# Patient Record
Sex: Male | Born: 1945
Health system: Southern US, Community
[De-identification: ages and names within clinical notes are randomized; demographics above are authoritative.]

## PROBLEM LIST (undated history)

## (undated) DIAGNOSIS — I639 Cerebral infarction, unspecified: Secondary | ICD-10-CM

## (undated) DIAGNOSIS — Z860101 Personal history of adenomatous and serrated colon polyps: Secondary | ICD-10-CM

## (undated) DIAGNOSIS — M6530 Trigger finger, unspecified finger: Secondary | ICD-10-CM

## (undated) DIAGNOSIS — M1990 Unspecified osteoarthritis, unspecified site: Secondary | ICD-10-CM

## (undated) DIAGNOSIS — I4891 Unspecified atrial fibrillation: Secondary | ICD-10-CM

## (undated) DIAGNOSIS — R001 Bradycardia, unspecified: Secondary | ICD-10-CM

## (undated) DIAGNOSIS — I10 Essential (primary) hypertension: Secondary | ICD-10-CM

## (undated) DIAGNOSIS — J939 Pneumothorax, unspecified: Secondary | ICD-10-CM

## (undated) DIAGNOSIS — I77810 Thoracic aortic ectasia: Secondary | ICD-10-CM

## (undated) DIAGNOSIS — D649 Anemia, unspecified: Secondary | ICD-10-CM

## (undated) DIAGNOSIS — M720 Palmar fascial fibromatosis [Dupuytren]: Secondary | ICD-10-CM

## (undated) DIAGNOSIS — Z86010 Personal history of colonic polyps: Secondary | ICD-10-CM

## (undated) DIAGNOSIS — I739 Peripheral vascular disease, unspecified: Secondary | ICD-10-CM

## (undated) HISTORY — DX: Essential (primary) hypertension: I10

## (undated) HISTORY — PX: OTHER SURGICAL HISTORY: SHX169

## (undated) HISTORY — DX: Peripheral vascular disease, unspecified: I73.9

## (undated) HISTORY — DX: Pneumothorax, unspecified: J93.9

## (undated) HISTORY — DX: Cerebral infarction, unspecified: I63.9

## (undated) HISTORY — PX: INGUINAL HERNIA REPAIR: SUR1180

## (undated) HISTORY — DX: Personal history of colonic polyps: Z86.010

## (undated) HISTORY — DX: Personal history of adenomatous and serrated colon polyps: Z86.0101

---

## 1998-09-18 ENCOUNTER — Inpatient Hospital Stay (HOSPITAL_COMMUNITY)
Admission: EM | Admit: 1998-09-18 | Discharge: 1998-09-24 | Payer: Self-pay | Admitting: Thoracic Surgery (Cardiothoracic Vascular Surgery)

## 1998-09-18 ENCOUNTER — Encounter: Payer: Self-pay | Admitting: Thoracic Surgery (Cardiothoracic Vascular Surgery)

## 1998-09-18 DIAGNOSIS — J939 Pneumothorax, unspecified: Secondary | ICD-10-CM

## 1998-09-18 HISTORY — DX: Pneumothorax, unspecified: J93.9

## 1998-09-19 ENCOUNTER — Encounter: Payer: Self-pay | Admitting: Thoracic Surgery (Cardiothoracic Vascular Surgery)

## 1998-09-20 ENCOUNTER — Encounter: Payer: Self-pay | Admitting: Thoracic Surgery (Cardiothoracic Vascular Surgery)

## 1998-09-20 HISTORY — PX: CRANIOTOMY: SHX93

## 1998-09-21 ENCOUNTER — Encounter: Payer: Self-pay | Admitting: Thoracic Surgery (Cardiothoracic Vascular Surgery)

## 1998-09-22 ENCOUNTER — Encounter: Payer: Self-pay | Admitting: Thoracic Surgery (Cardiothoracic Vascular Surgery)

## 1998-09-22 ENCOUNTER — Encounter: Payer: Self-pay | Admitting: Surgery

## 1998-09-23 ENCOUNTER — Encounter: Payer: Self-pay | Admitting: Thoracic Surgery (Cardiothoracic Vascular Surgery)

## 2001-12-16 ENCOUNTER — Emergency Department (HOSPITAL_COMMUNITY): Admission: EM | Admit: 2001-12-16 | Discharge: 2001-12-16 | Payer: Self-pay | Admitting: Emergency Medicine

## 2003-06-06 ENCOUNTER — Encounter: Payer: Self-pay | Admitting: Internal Medicine

## 2003-06-06 ENCOUNTER — Ambulatory Visit (HOSPITAL_COMMUNITY): Admission: RE | Admit: 2003-06-06 | Discharge: 2003-06-06 | Payer: Self-pay | Admitting: Internal Medicine

## 2003-09-21 DIAGNOSIS — I639 Cerebral infarction, unspecified: Secondary | ICD-10-CM

## 2003-09-21 HISTORY — DX: Cerebral infarction, unspecified: I63.9

## 2004-10-20 ENCOUNTER — Ambulatory Visit (HOSPITAL_COMMUNITY): Admission: RE | Admit: 2004-10-20 | Discharge: 2004-10-20 | Payer: Self-pay | Admitting: Internal Medicine

## 2004-10-22 ENCOUNTER — Ambulatory Visit (HOSPITAL_COMMUNITY): Admission: RE | Admit: 2004-10-22 | Discharge: 2004-10-22 | Payer: Self-pay | Admitting: Internal Medicine

## 2004-10-27 ENCOUNTER — Ambulatory Visit (HOSPITAL_COMMUNITY): Admission: RE | Admit: 2004-10-27 | Discharge: 2004-10-27 | Payer: Self-pay | Admitting: Internal Medicine

## 2004-10-27 DIAGNOSIS — S065XAA Traumatic subdural hemorrhage with loss of consciousness status unknown, initial encounter: Secondary | ICD-10-CM

## 2004-10-27 HISTORY — DX: Traumatic subdural hemorrhage with loss of consciousness status unknown, initial encounter: S06.5XAA

## 2005-03-26 ENCOUNTER — Ambulatory Visit (HOSPITAL_COMMUNITY): Admission: RE | Admit: 2005-03-26 | Discharge: 2005-03-26 | Payer: Self-pay | Admitting: Internal Medicine

## 2005-08-18 ENCOUNTER — Encounter (INDEPENDENT_AMBULATORY_CARE_PROVIDER_SITE_OTHER): Payer: Self-pay | Admitting: General Surgery

## 2005-08-18 ENCOUNTER — Observation Stay (HOSPITAL_COMMUNITY): Admission: RE | Admit: 2005-08-18 | Discharge: 2005-08-19 | Payer: Self-pay | Admitting: General Surgery

## 2007-06-08 ENCOUNTER — Ambulatory Visit (HOSPITAL_COMMUNITY): Admission: RE | Admit: 2007-06-08 | Discharge: 2007-06-08 | Payer: Self-pay | Admitting: Family Medicine

## 2008-09-24 ENCOUNTER — Ambulatory Visit (HOSPITAL_COMMUNITY): Admission: RE | Admit: 2008-09-24 | Discharge: 2008-09-24 | Payer: Self-pay | Admitting: Internal Medicine

## 2008-10-02 ENCOUNTER — Ambulatory Visit (HOSPITAL_COMMUNITY): Admission: RE | Admit: 2008-10-02 | Discharge: 2008-10-02 | Payer: Self-pay | Admitting: Orthopaedic Surgery

## 2009-01-10 ENCOUNTER — Ambulatory Visit: Payer: Self-pay | Admitting: Gastroenterology

## 2009-01-13 ENCOUNTER — Encounter: Payer: Self-pay | Admitting: Gastroenterology

## 2009-01-13 DIAGNOSIS — K625 Hemorrhage of anus and rectum: Secondary | ICD-10-CM

## 2009-01-18 HISTORY — PX: COLONOSCOPY: SHX174

## 2009-01-20 ENCOUNTER — Ambulatory Visit: Payer: Self-pay | Admitting: Gastroenterology

## 2009-01-20 ENCOUNTER — Ambulatory Visit (HOSPITAL_COMMUNITY): Admission: RE | Admit: 2009-01-20 | Discharge: 2009-01-20 | Payer: Self-pay | Admitting: Gastroenterology

## 2009-01-20 ENCOUNTER — Encounter: Payer: Self-pay | Admitting: Gastroenterology

## 2009-07-15 ENCOUNTER — Ambulatory Visit: Payer: Self-pay | Admitting: Gastroenterology

## 2009-07-15 DIAGNOSIS — D126 Benign neoplasm of colon, unspecified: Secondary | ICD-10-CM | POA: Insufficient documentation

## 2009-07-17 DIAGNOSIS — K648 Other hemorrhoids: Secondary | ICD-10-CM

## 2009-08-08 ENCOUNTER — Ambulatory Visit (HOSPITAL_COMMUNITY): Admission: RE | Admit: 2009-08-08 | Discharge: 2009-08-08 | Payer: Self-pay | Admitting: Internal Medicine

## 2011-02-02 NOTE — Op Note (Signed)
NAME:  Larry Reese, Larry Reese             ACCOUNT NO.:  [id number]   MEDICAL RECORD NO.:  [id number]          PATIENT TYPE:  AMB   LOCATION:  DAY                           FACILITY:  [facility]   PHYSICIAN:  Kassie Mends, M.D.      DATE OF BIRTH:  [birth year]   DATE OF PROCEDURE:  01/20/2009  DATE OF DISCHARGE:                               OPERATIVE REPORT   PROCEDURE:  Ileocolonoscopy with cold forceps and snare cautery  polypectomy.   INDICATION FOR EXAM:  Larry Reese is a 65 year old male, who presents  with intermittent rectal bleeding and constipation.   FINDINGS:  1. Tortuous colon.  Able to visualize 5 cm of the distal terminal      ileum.  It was normal.  2. Two 6-mm transverse colon polyps were removed via cold forceps.      Two 6-mm sessile descending colon polyps were removed via snare      cautery.  Two 3-4 mm sessile rectal polyps were removed via cold      forceps.  Otherwise no evidence of masses, inflammatory changes, or      diverticular AVMs.  3. Moderate internal hemorrhoids.  Otherwise normal retroflexed view      of the rectum.   DIAGNOSES:  1. Rectal bleeding likely secondary to hemorrhoids.  2. Multiple colon polyps.   RECOMMENDATIONS:  1. The patient is instructed to drink 6-8 cups of water daily.  He is      to add Benefiber twice a day and follow high fiber diet.  He is      given a handout on high-fiber diet, polyps, and hemorrhoids.  2. No aspirin, NSAIDs, or anticoagulation for 7 days.  3. Will call Larry Reese with the results of biopsies.  Due to the      number of polyps, we would consider colonoscopy in 5 years if he      has simple adenomas.  His first-degree relatives should have a      screening colonoscopy at age 71 and then every 5 years.  4. Follow up appointment in 3 months.  5. Will check a TSH to evaluate for thyroid disease as a etiology for      his constipation.   MEDICATIONS:  1. Demerol 100 mg IV.  2. Versed 6 mg IV.   PROCEDURE  TECHNIQUE:  Physical exam was performed.  Informed consent was  obtained from the patient explaining the benefits, risks, and  alternatives to the procedure.  The patient was connected to monitor and  placed in left lateral position.  Continuous oxygen was provided by  nasal cannula.  IV medicine administered through an indwelling cannula.  After administration of sedation and rectal exam, the patient's rectum  was intubated.  Scope was advanced under direct visualization to the  distal terminal ileum.  The scope was removed slowly by careful  examining the color, texture, anatomy, and integrity of mucosa on the  way out.  The patient was recovered in endoscopy and discharged home in  satisfactory condition.   PATH:  Simple adenomas. TCS in 5 years High fiber  diet. First degree  relatives need TCS at age 32 and then every 5 years.      Kassie Mends, M.D.  Electronically Signed     SM/MEDQ  D:  01/20/2009  T:  01/21/2009  Job:  [id number]   cc:   Tesfaye D. Felecia Shelling, MD  Fax: [phone]

## 2011-02-05 NOTE — Op Note (Signed)
NAME:  NOLAWI, KANADY NO.:  [id number]   MEDICAL RECORD NO.:  [id number]          PATIENT TYPE:  OBV   LOCATION:  A304                          FACILITY:  [facility]   PHYSICIAN:  Barbaraann Barthel, M.D. DATE OF BIRTH:  1946-03-17   DATE OF PROCEDURE:  08/18/2005  DATE OF DISCHARGE:                                 OPERATIVE REPORT   SURGEON:  Barbaraann Barthel, M.D.   PRE-AND-POSTOPERATIVE DIAGNOSIS:  Right inguinal hernia   PROCEDURE:  Right inguinal herniorrhaphy (modified McVay repair).   SPECIMEN:  The specimen is a hernia sac with lipomatous properitoneal fat.   NOTE:  This is 65 year old black male who presented with a right groin mass  consistent with a large, nonincarcerated, right, inguinal hernia. We  discussed elective repair, in detail, in my office. We discussed  complications not limited to but including bleeding, infection and  recurrence. Informed consent was obtained.   GROSS OPERATIVE FINDINGS:  Those consistent with an indirect hernia and some  lipomatous cord-like structures. There was some weakness of the inguinal  floor as well. There were no other abnormalities encountered.   TECHNIQUE:  The patient was placed in the supine position after the adequate  administration of spinal anesthesia.  A Foley catheter was placed; and then  after prepping with Betadine and draping in the usual manner, an incision  was carried out between the anterior-superior iliac spine and pubic  tubercle.  Skin, subcutaneous tissue, and the external oblique was opened  through the external ring. The ilioinguinal nerve was preserved from the  dissection.  The cord structures were encountered, dissected free from a  hernia sac; and the hernia sac was the ligated under direct vision with a  pursestring suture, and the redundant portion was then amputated and sent as  a specimen.  Two large lipomatous structures were likewise ligated with 2-0  silk; and sent as well with  the specimen.   The inguinal floor was then repaired the suturing transversus abdominis and  transversalis fascia to Cooper's ligament and Poupart's ligament with  interrupted 2-0 Bralon sutures. Prior to cinching these tightly, a relaxing  incision was carried out. We then used approximately 10 mL of 1/2%  Sensorcaine to help with postoperative comfort in this area. We then  returned the cord structures to their anatomical position and closed the  external oblique  over this with a running 3-0 Polysorb suture. Subcu was irrigated; and the  skin was approximated with a stapling device. Prior to closure all sponge,  needle, and instrument counts were found to be correct. Estimated blood loss  was minimal. The patient received 700 mL of crystalloids intraoperatively.  There were no complications.      Barbaraann Barthel, M.D.  Electronically Signed     WB/MEDQ  D:  08/18/2005  T:  08/18/2005  Job:  [id number]   cc:   Tesfaye D. Felecia Shelling, MD  Fax: [phone]

## 2013-10-01 ENCOUNTER — Ambulatory Visit (HOSPITAL_COMMUNITY)
Admission: RE | Admit: 2013-10-01 | Discharge: 2013-10-01 | Disposition: A | Discharge status: Home / Self Care | Payer: Medicare HMO | Source: Ambulatory Visit | Attending: Internal Medicine | Admitting: Internal Medicine

## 2013-10-01 ENCOUNTER — Other Ambulatory Visit (HOSPITAL_COMMUNITY): Payer: Self-pay | Admitting: Internal Medicine

## 2013-10-01 DIAGNOSIS — J40 Bronchitis, not specified as acute or chronic: Secondary | ICD-10-CM

## 2013-10-01 DIAGNOSIS — J449 Chronic obstructive pulmonary disease, unspecified: Secondary | ICD-10-CM | POA: Insufficient documentation

## 2013-10-01 DIAGNOSIS — Z87891 Personal history of nicotine dependence: Secondary | ICD-10-CM | POA: Insufficient documentation

## 2013-10-01 DIAGNOSIS — J4489 Other specified chronic obstructive pulmonary disease: Secondary | ICD-10-CM | POA: Insufficient documentation

## 2013-10-01 DIAGNOSIS — R0989 Other specified symptoms and signs involving the circulatory and respiratory systems: Secondary | ICD-10-CM | POA: Insufficient documentation

## 2013-12-24 ENCOUNTER — Encounter: Payer: Self-pay | Admitting: Gastroenterology

## 2015-01-20 ENCOUNTER — Encounter: Payer: Self-pay | Admitting: Gastroenterology

## 2015-02-07 ENCOUNTER — Other Ambulatory Visit (HOSPITAL_COMMUNITY): Payer: Medicare HMO

## 2015-02-07 ENCOUNTER — Ambulatory Visit: Payer: Self-pay | Admitting: Gastroenterology

## 2015-02-10 ENCOUNTER — Other Ambulatory Visit: Payer: Self-pay

## 2015-02-10 ENCOUNTER — Encounter (INDEPENDENT_AMBULATORY_CARE_PROVIDER_SITE_OTHER): Payer: Self-pay

## 2015-02-10 ENCOUNTER — Encounter (HOSPITAL_COMMUNITY)
Admission: RE | Admit: 2015-02-10 | Discharge: 2015-02-10 | Disposition: A | Discharge status: Home / Self Care | Payer: PPO | Source: Ambulatory Visit | Attending: Orthopaedic Surgery | Admitting: Orthopaedic Surgery

## 2015-02-10 ENCOUNTER — Encounter (HOSPITAL_COMMUNITY): Payer: Self-pay

## 2015-02-10 ENCOUNTER — Encounter: Payer: Self-pay | Admitting: Gastroenterology

## 2015-02-10 ENCOUNTER — Ambulatory Visit (INDEPENDENT_AMBULATORY_CARE_PROVIDER_SITE_OTHER): Payer: PPO | Admitting: Gastroenterology

## 2015-02-10 VITALS — BP 133/81 | HR 60 | Temp 97.7°F | Ht 78.0 in | Wt 225.2 lb

## 2015-02-10 DIAGNOSIS — M1990 Unspecified osteoarthritis, unspecified site: Secondary | ICD-10-CM | POA: Diagnosis not present

## 2015-02-10 DIAGNOSIS — R9431 Abnormal electrocardiogram [ECG] [EKG]: Secondary | ICD-10-CM | POA: Diagnosis not present

## 2015-02-10 DIAGNOSIS — R7989 Other specified abnormal findings of blood chemistry: Secondary | ICD-10-CM

## 2015-02-10 DIAGNOSIS — Z86010 Personal history of colonic polyps: Secondary | ICD-10-CM

## 2015-02-10 DIAGNOSIS — Z79899 Other long term (current) drug therapy: Secondary | ICD-10-CM | POA: Diagnosis not present

## 2015-02-10 DIAGNOSIS — Z8673 Personal history of transient ischemic attack (TIA), and cerebral infarction without residual deficits: Secondary | ICD-10-CM | POA: Diagnosis not present

## 2015-02-10 DIAGNOSIS — M7021 Olecranon bursitis, right elbow: Secondary | ICD-10-CM | POA: Diagnosis present

## 2015-02-10 DIAGNOSIS — F1010 Alcohol abuse, uncomplicated: Secondary | ICD-10-CM | POA: Diagnosis not present

## 2015-02-10 DIAGNOSIS — R945 Abnormal results of liver function studies: Secondary | ICD-10-CM

## 2015-02-10 DIAGNOSIS — F17210 Nicotine dependence, cigarettes, uncomplicated: Secondary | ICD-10-CM | POA: Diagnosis not present

## 2015-02-10 DIAGNOSIS — R001 Bradycardia, unspecified: Secondary | ICD-10-CM | POA: Diagnosis not present

## 2015-02-10 DIAGNOSIS — I10 Essential (primary) hypertension: Secondary | ICD-10-CM | POA: Diagnosis not present

## 2015-02-10 DIAGNOSIS — K7010 Alcoholic hepatitis without ascites: Secondary | ICD-10-CM | POA: Insufficient documentation

## 2015-02-10 HISTORY — DX: Trigger finger, unspecified finger: M65.30

## 2015-02-10 HISTORY — DX: Unspecified osteoarthritis, unspecified site: M19.90

## 2015-02-10 LAB — CBC WITH DIFFERENTIAL/PLATELET
Basophils Absolute: 0 10*3/uL (ref 0.0–0.1)
Basophils Relative: 0 % (ref 0–1)
EOS ABS: 0.2 10*3/uL (ref 0.0–0.7)
EOS PCT: 4 % (ref 0–5)
HCT: 39.2 % (ref 39.0–52.0)
HEMOGLOBIN: 13 g/dL (ref 13.0–17.0)
LYMPHS PCT: 35 % (ref 12–46)
Lymphs Abs: 1.7 10*3/uL (ref 0.7–4.0)
MCH: 31.3 pg (ref 26.0–34.0)
MCHC: 33.2 g/dL (ref 30.0–36.0)
MCV: 94.5 fL (ref 78.0–100.0)
Monocytes Absolute: 0.5 10*3/uL (ref 0.1–1.0)
Monocytes Relative: 10 % (ref 3–12)
NEUTROS ABS: 2.4 10*3/uL (ref 1.7–7.7)
Neutrophils Relative %: 51 % (ref 43–77)
PLATELETS: 329 10*3/uL (ref 150–400)
RBC: 4.15 MIL/uL — AB (ref 4.22–5.81)
RDW: 12.8 % (ref 11.5–15.5)
WBC: 4.9 10*3/uL (ref 4.0–10.5)

## 2015-02-10 LAB — URINALYSIS, ROUTINE W REFLEX MICROSCOPIC
BILIRUBIN URINE: NEGATIVE
Glucose, UA: NEGATIVE mg/dL
Hgb urine dipstick: NEGATIVE
Ketones, ur: NEGATIVE mg/dL
Leukocytes, UA: NEGATIVE
Nitrite: NEGATIVE
PROTEIN: NEGATIVE mg/dL
Specific Gravity, Urine: 1.025 (ref 1.005–1.030)
Urobilinogen, UA: 0.2 mg/dL (ref 0.0–1.0)
pH: 5.5 (ref 5.0–8.0)

## 2015-02-10 LAB — COMPREHENSIVE METABOLIC PANEL
ALBUMIN: 3.9 g/dL (ref 3.5–5.0)
ALK PHOS: 78 U/L (ref 38–126)
ALT: 15 U/L — AB (ref 17–63)
ANION GAP: 8 (ref 5–15)
AST: 18 U/L (ref 15–41)
BUN: 13 mg/dL (ref 6–20)
CO2: 26 mmol/L (ref 22–32)
Calcium: 9.2 mg/dL (ref 8.9–10.3)
Chloride: 102 mmol/L (ref 101–111)
Creatinine, Ser: 0.93 mg/dL (ref 0.61–1.24)
GFR calc Af Amer: >60 mL/min (ref >60)
Glucose, Bld: 101 mg/dL — ABNORMAL HIGH (ref 65–99)
Potassium: 4.7 mmol/L (ref 3.5–5.1)
Sodium: 136 mmol/L (ref 135–145)
TOTAL PROTEIN: 6.7 g/dL (ref 6.5–8.1)
Total Bilirubin: 0.8 mg/dL (ref 0.3–1.2)

## 2015-02-10 NOTE — Pre-Procedure Instructions (Signed)
Patient given information to sign up for my chart at home. 

## 2015-02-10 NOTE — Patient Instructions (Signed)
ROWYN SPILDE  02/10/2015     @PREFPERIOPPHARMACY @   Your procedure is scheduled on  02/11/2015  Report to [facility] at  31  A.M.  Call this number if you have problems the morning of surgery:  [phone]   Remember:  Do not eat food or drink liquids after midnight.  Take these medicines the morning of surgery with A SIP OF WATER  Xanax, amlodipine, hydrocodone.   Do not wear jewelry, make-up or nail polish.  Do not wear lotions, powders, or perfumes.  You may wear deodorant.  Do not shave 48 hours prior to surgery.  Men may shave face and neck.  Do not bring valuables to the hospital.  [facility] is not responsible for any belongings or valuables.  Contacts, dentures or bridgework may not be worn into surgery.  Leave your suitcase in the car.  After surgery it may be brought to your room.  For patients admitted to the hospital, discharge time will be determined by your treatment team.  Patients discharged the day of surgery will not be allowed to drive home.   Name and phone number of your driver:   family Special instructions:  none  Please read over the following fact sheets that you were given. Surgical Site Infection Prevention and Anesthesia Post-op Instructions      Olecranon Bursitis Bursitis is swelling and soreness (inflammation) of a fluid-filled sac (bursa) that covers and protects a joint. Olecranon bursitis occurs over the elbow.  CAUSES Bursitis can be caused by injury, overuse of the joint, arthritis, or infection.  SYMPTOMS   Tenderness, swelling, warmth, or redness over the elbow.  Elbow pain with movement. This is greater with bending the elbow.  Squeaking sound when the bursa is rubbed or moved.  Increasing size of the bursa without pain or discomfort.  Fever with increasing pain and swelling if the bursa becomes infected. HOME CARE INSTRUCTIONS   Put ice on the affected area.  Put ice in a plastic bag.  Place a towel  between your skin and the bag.  Leave the ice on for 15-20 minutes each hour while awake. Do this for the first 2 days.  When resting, elevate your elbow above the level of your heart. This helps reduce swelling.  Continue to put the joint through a full range of motion 4 times per day. Rest the injured joint at other times. When the pain lessens, begin normal slow movements and usual activities.  Only take over-the-counter or prescription medicines for pain, discomfort, or fever as directed by your caregiver.  Reduce your intake of milk and related dairy products (cheese, yogurt). They may make your condition worse. SEEK IMMEDIATE MEDICAL CARE IF:   Your pain increases even during treatment.  You have a fever.  You have heat and inflammation over the bursa and elbow.  You have a red line that goes up your arm.  You have pain with movement of your elbow. MAKE SURE YOU:   Understand these instructions.  Will watch your condition.  Will get help right away if you are not doing well or get worse. Document Released: 10/06/2006 Document Revised: 11/29/2011 Document Reviewed: 08/22/2007 [facility] Patient Information 2015 [place], [place]. This information is not intended to replace advice given to you by your health care provider. Make sure you discuss any questions you have with your health care provider. PATIENT INSTRUCTIONS POST-ANESTHESIA  IMMEDIATELY FOLLOWING SURGERY:  Do not drive or operate machinery  for the first twenty four hours after surgery.  Do not make any important decisions for twenty four hours after surgery or while taking narcotic pain medications or sedatives.  If you develop intractable nausea and vomiting or a severe headache please notify your doctor immediately.  FOLLOW-UP:  Please make an appointment with your surgeon as instructed. You do not need to follow up with anesthesia unless specifically instructed to do so.  WOUND CARE INSTRUCTIONS (if applicable):   Keep a dry clean dressing on the anesthesia/puncture wound site if there is drainage.  Once the wound has quit draining you may leave it open to air.  Generally you should leave the bandage intact for twenty four hours unless there is drainage.  If the epidural site drains for more than 36-48 hours please call the anesthesia department.  QUESTIONS?:  Please feel free to call your physician or the hospital operator if you have any questions, and they will be happy to assist you.

## 2015-02-10 NOTE — Assessment & Plan Note (Signed)
Routine labs recently showed mildly elevated total bilirubin, mostly indirect however, minimally elevated alkaline phosphatase. Patient has a history of chronic daily alcohol use. Hepatitis C antibody was negative. His hepatitis B surface antibody was reactive, hepatitis B surface antigen nonreactive. 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  At this time routine labs to rule out hemochromatosis, repeat LFTs, rule out PBC. Abdominal ultrasound with elastography given abnormal lfts and long-term daily etoh use. Further recommendations to follow. He was advised to try cutting back on alcohol consumption with goal of stopping altogether.

## 2015-02-10 NOTE — OR Nursing (Signed)
EKG reported to Dr. Patsey Berthold,  No further orders needed

## 2015-02-10 NOTE — H&P (Signed)
Larry Reese is an 69 y.o. male.   Chief Complaint: Right olecranon bursitis. HPI: He has a large right olecranon bursa swelling, about the size of a lime to a lemon.  It is not red.  It has bothered him for several months.  I aspirated it on 01/15/15 but it recurred.  I aspirated it again on 01/22/15.  I have advised excision because it comes back.  It has not been red.  It bothers him when he rests his arm/elbow on a chair.  He has no trauma.  I have explained the outpatient elective surgery including risks and imponderables.  He will have a drain placed in the elbow and see me the following day in the office for removal.  He has elected to have the surgery at this time.  He asked appropriate questions and appears to understand the procedure.  Past Medical History  Diagnosis Date  . Hypertension   . Pneumothorax   . Hx of adenomatous colonic polyps   . Stroke 2005    left sided weakness  . Arthritis   . Trigger finger     Past Surgical History  Procedure Laterality Date  . Colonoscopy  May 2010    Dr. fields: 5 cm terminal ileum normal, 6 polyps removed, moderate internal hemorrhoids, simple adenomas, next colonoscopy May 2015  . Craniotomy  2000    pressure  . Inguinal hernia repair Right   . Repair of trigger finger Left     pinky    Family History  Problem Relation Age of Onset  . Esophageal cancer Father   . Colon cancer Neg Hx    Social History:  reports that he has been smoking Cigarettes.  He has a 37.5 pack-year smoking history. He does not have any smokeless tobacco history on file. He reports that he drinks about 3.0 oz of alcohol per week. He reports that he does not use illicit drugs.  Allergies:  Allergies  Allergen Reactions  . Aleve [Naproxen] Other (See Comments)    Numbness in extremities   . Celecoxib Other (See Comments)    Nose bleed.  . Fluoxetine Hcl Swelling    No prescriptions prior to admission    Results for orders placed or performed  during the hospital encounter of 02/10/15 (from the past 48 hour(s))  CBC with Differential/Platelet     Status: Abnormal   Collection Time: 02/10/15 12:00 PM  Result Value Ref Range   WBC 4.9 4.0 - 10.5 K/uL   RBC 4.15 (L) 4.22 - 5.81 MIL/uL   Hemoglobin 13.0 13.0 - 17.0 g/dL   HCT 39.2 39.0 - 52.0 %   MCV 94.5 78.0 - 100.0 fL   MCH 31.3 26.0 - 34.0 pg   MCHC 33.2 30.0 - 36.0 g/dL   RDW 12.8 11.5 - 15.5 %   Platelets 329 150 - 400 K/uL   Neutrophils Relative % 51 43 - 77 %   Neutro Abs 2.4 1.7 - 7.7 K/uL   Lymphocytes Relative 35 12 - 46 %   Lymphs Abs 1.7 0.7 - 4.0 K/uL   Monocytes Relative 10 3 - 12 %   Monocytes Absolute 0.5 0.1 - 1.0 K/uL   Eosinophils Relative 4 0 - 5 %   Eosinophils Absolute 0.2 0.0 - 0.7 K/uL   Basophils Relative 0 0 - 1 %   Basophils Absolute 0.0 0.0 - 0.1 K/uL  Comprehensive metabolic panel     Status: Abnormal   Collection Time: 02/10/15 12:00  PM  Result Value Ref Range   Sodium 136 135 - 145 mmol/L   Potassium 4.7 3.5 - 5.1 mmol/L   Chloride 102 101 - 111 mmol/L   CO2 26 22 - 32 mmol/L   Glucose, Bld 101 (H) 65 - 99 mg/dL   BUN 13 6 - 20 mg/dL   Creatinine, Ser 0.93 0.61 - 1.24 mg/dL   Calcium 9.2 8.9 - 10.3 mg/dL   Total Protein 6.7 6.5 - 8.1 g/dL   Albumin 3.9 3.5 - 5.0 g/dL   AST 18 15 - 41 U/L   ALT 15 (L) 17 - 63 U/L   Alkaline Phosphatase 78 38 - 126 U/L   Total Bilirubin 0.8 0.3 - 1.2 mg/dL   GFR calc non Af Amer >60 >60 mL/min   GFR calc Af Amer >60 >60 mL/min    Comment: (NOTE) The eGFR has been calculated using the CKD EPI equation. This calculation has not been validated in all clinical situations. eGFR's persistently <60 mL/min signify possible Chronic Kidney Disease.    Anion gap 8 5 - 15  Urinalysis, Routine w reflex microscopic     Status: None   Collection Time: 02/10/15 12:00 PM  Result Value Ref Range   Color, Urine YELLOW YELLOW   APPearance CLEAR CLEAR   Specific Gravity, Urine 1.025 1.005 - 1.030   pH 5.5 5.0 -  8.0   Glucose, UA NEGATIVE NEGATIVE mg/dL   Hgb urine dipstick NEGATIVE NEGATIVE   Bilirubin Urine NEGATIVE NEGATIVE   Ketones, ur NEGATIVE NEGATIVE mg/dL   Protein, ur NEGATIVE NEGATIVE mg/dL   Urobilinogen, UA 0.2 0.0 - 1.0 mg/dL   Nitrite NEGATIVE NEGATIVE   Leukocytes, UA NEGATIVE NEGATIVE    Comment: MICROSCOPIC NOT DONE ON URINES WITH NEGATIVE PROTEIN, BLOOD, LEUKOCYTES, NITRITE, OR GLUCOSE <1000 mg/dL.   No results found.  Review of Systems  Musculoskeletal: Positive for joint pain (He has large olecranon bursa on the right.).    There were no vitals taken for this visit. Physical Exam  Constitutional: He is oriented to person, place, and time. He appears well-developed and well-nourished.  HENT:  Head: Normocephalic and atraumatic.  Eyes: Conjunctivae and EOM are normal. Pupils are equal, round, and reactive to light.  Neck: Normal range of motion. Neck supple.  Cardiovascular: Normal rate, regular rhythm, normal heart sounds and intact distal pulses.   Respiratory: Effort normal and breath sounds normal.  GI: Soft.  Musculoskeletal: He exhibits tenderness (He has large olecranon bursa on the right with fluid within it.  Size of a small lime.  It is not red.  NV is intact.  ROM of the elbow is full.).  Neurological: He is alert and oriented to person, place, and time.  Skin: Skin is warm and dry.  Psychiatric: He has a normal mood and affect. His behavior is normal. Judgment and thought content normal.     Assessment/Plan Large right olecranon bursitis and swelling for elective excision as an outpatient.  Larry Reese 02/10/2015, 2:23 PM

## 2015-02-10 NOTE — Progress Notes (Signed)
Primary Care Physician:  [name], MD  Primary Gastroenterologist:  Barney Drain, MD   Chief Complaint  Patient presents with  . abnormal labs    HPI:  Larry Reese is a 69 y.o. male here for further evaluation of abnormal LFTs at the request of Dr. Legrand Rams. Patient recently had routine labs with a physical in April 2016. His total bilirubin was elevated at 1.6, indirect bilirubin elevated at 1.4, alkaline phosphatase elevated at 155. His AST and ALT were normal. Other labs as outlined below. 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 He believes he worked into the late 80s and early 56s. His hepatitis B surface antigen was negative.  He feels well with regards to GI issues. He is having some right elbow swelling and has surgery scheduled for tomorrow. He denies abdominal pain, vomiting, heartburn. BM every other day. Sometimes has to strain but not usually. No melena, brbpr. No weight loss.  With regards to abnormal LFTs, he states this is the first time he had this issue. First told about it in April. He has not been on any cholesterol medication. Denies any herbal medications. No associated symptoms. He does consume alcohol daily. 5-6 (12 ounce beers) daily chronically. Sometimes "harder stuff if I have it". He is very active, travels frequently on vacation. No prior abdominal ultrasound. No prior EGD. No indications at this point of advanced liver disease. He he denies any family history of liver disease.    Current Outpatient Prescriptions  Medication Sig Dispense Refill  . ALPRAZolam (XANAX) 0.5 MG tablet Take 0.5 mg by mouth at bedtime as needed for anxiety.    Marland Kitchen amLODipine (NORVASC) 5 MG tablet Take 5 mg by mouth daily.    Marland Kitchen aspirin 81 MG tablet Take 81 mg by mouth daily.    Marland Kitchen HYDROcodone-acetaminophen (NORCO) 7.5-325 MG per tablet Take 1 tablet by mouth every 4 (four) hours as needed for  moderate pain.     No current facility-administered medications for this visit.    Allergies as of 02/10/2015 - Review Complete 02/10/2015  Allergen Reaction Noted  . Aleve [naproxen] Other (See Comments) 02/10/2015  . Celecoxib    . Fluoxetine hcl  07/15/2009    Past Medical History  Diagnosis Date  . Hypertension   . Pneumothorax   . Stroke 2005  . Hx of adenomatous colonic polyps     Past Surgical History  Procedure Laterality Date  . Colonoscopy  May 2010    Dr. fields: 5 cm terminal ileum normal, 6 polyps removed, moderate internal hemorrhoids, simple adenomas, next colonoscopy May 2015  . Inguinal hernia repair    . Craniotomy  2000    pressure    Family History  Problem Relation Age of Onset  . Esophageal cancer Father   . Colon cancer Neg Hx     History   Social History  . Marital Status: Married    Spouse Name: N/A  . Number of Children: 3  . Years of Education: N/A   Occupational History  . retired    Social History Main Topics  . Smoking status: Current Every Day Smoker -- 0.75 packs/day    Types: Cigarettes  . Smokeless tobacco: Not on file  . Alcohol Use: 0.0 oz/week    0 Standard drinks or equivalent per week     Comment: 4-6 per day (12 ounces)  . Drug Use: No  .  Sexual Activity: Not on file   Other Topics Concern  . Not on file   Social History Narrative  . No narrative on file      ROS:  General: Negative for anorexia, weight loss, fever, chills, fatigue, weakness. Eyes: Negative for vision changes.  ENT: Negative for hoarseness, difficulty swallowing , nasal congestion. CV: Negative for chest pain, angina, palpitations, dyspnea on exertion, peripheral edema.  Respiratory: Negative for dyspnea at rest, dyspnea on exertion, cough, sputum, wheezing.  GI: See history of present illness. GU:  Negative for dysuria, hematuria, urinary incontinence, urinary frequency, nocturnal urination.  MS: Negative for joint pain, low back pain.   Derm: Negative for rash or itching.  Neuro: Negative for weakness, abnormal sensation, seizure, frequent headaches, memory loss, confusion. Residual left-sided mild weakness since stroke. Psych: Negative for anxiety, depression, suicidal ideation, hallucinations.  Endo: Negative for unusual weight change.  Heme: Negative for bruising or bleeding. Allergy: Negative for rash or hives.    Physical Examination:  BP 133/81 mmHg  Pulse 60  Temp(Src) 97.7 F (36.5 C)  Ht 6\' 6"  (1.981 m)  Wt 225 lb 3.2 oz (102.15 kg)  BMI 26.03 kg/m2   General: Well-nourished, well-developed in no acute distress.  Head: Normocephalic, atraumatic.   Eyes: Conjunctiva pink, no icterus. Mouth: Oropharyngeal mucosa moist and pink , no lesions erythema or exudate. Neck: Supple without thyromegaly, masses, or lymphadenopathy.  Lungs: Clear to auscultation bilaterally.  Heart: Regular rate and rhythm, no murmurs rubs or gallops.  Abdomen: Bowel sounds are normal, nontender, nondistended, no hepatosplenomegaly or masses, no abdominal bruits or    hernia , no rebound or guarding.   Rectal: Not performed Extremities: No lower extremity edema. No clubbing or deformities.  Neuro: Alert and oriented x 4 , grossly normal neurologically.  Skin: Warm and dry, no rash or jaundice.   Psych: Alert and cooperative, normal mood and affect.  Labs: Labs from April 2016 Total bilirubin 1.65, indirect bilirubin 1.4 high, alkaline phosphatase 155 high, AST 31, ALT 26. Albumin 4.5. Let cell count 6300, hemoglobin 15.1, hematocrit 47, platelets 3 46,000, hemoglobin A1c 5.6, BUN 11, creatinine 1.07, hepatitis C antibody nonreactive, hepatitis B surface antibody reactive, hepatitis B surface antigen nonreactive.  Imaging Studies: No results found.

## 2015-02-10 NOTE — Patient Instructions (Signed)
1. Please have your blood work done. 2. Ultrasound of your liver as scheduled. 3. We will call you with results as available. Usually will call within 7-10 business days. Further recommendations at that time. 4. You are due for a colonoscopy at any time given your history of colon polyps. Await labs and ultrasound in then we will schedule at a later date.

## 2015-02-10 NOTE — Assessment & Plan Note (Signed)
Overdue for surveillance colonoscopy but wants to postpone for now. Based on liver work up he may require EGD (if any indication that he has cirrhosis) which can be done at the same time.

## 2015-02-11 ENCOUNTER — Ambulatory Visit (HOSPITAL_COMMUNITY): Payer: PPO | Admitting: Anesthesiology

## 2015-02-11 ENCOUNTER — Encounter (HOSPITAL_COMMUNITY)
Admission: RE | Disposition: A | Discharge status: Home / Self Care | Payer: Self-pay | Source: Ambulatory Visit | Attending: Orthopaedic Surgery

## 2015-02-11 ENCOUNTER — Encounter (HOSPITAL_COMMUNITY): Payer: Self-pay | Admitting: *Deleted

## 2015-02-11 ENCOUNTER — Ambulatory Visit (HOSPITAL_COMMUNITY)
Admission: RE | Admit: 2015-02-11 | Discharge: 2015-02-11 | Disposition: A | Discharge status: Home / Self Care | Payer: PPO | Source: Ambulatory Visit | Attending: Orthopaedic Surgery | Admitting: Orthopaedic Surgery

## 2015-02-11 DIAGNOSIS — M1990 Unspecified osteoarthritis, unspecified site: Secondary | ICD-10-CM | POA: Insufficient documentation

## 2015-02-11 DIAGNOSIS — R9431 Abnormal electrocardiogram [ECG] [EKG]: Secondary | ICD-10-CM | POA: Insufficient documentation

## 2015-02-11 DIAGNOSIS — M7021 Olecranon bursitis, right elbow: Secondary | ICD-10-CM | POA: Diagnosis not present

## 2015-02-11 DIAGNOSIS — F17210 Nicotine dependence, cigarettes, uncomplicated: Secondary | ICD-10-CM | POA: Diagnosis not present

## 2015-02-11 DIAGNOSIS — I10 Essential (primary) hypertension: Secondary | ICD-10-CM | POA: Insufficient documentation

## 2015-02-11 DIAGNOSIS — Z8673 Personal history of transient ischemic attack (TIA), and cerebral infarction without residual deficits: Secondary | ICD-10-CM | POA: Insufficient documentation

## 2015-02-11 DIAGNOSIS — Z79899 Other long term (current) drug therapy: Secondary | ICD-10-CM | POA: Insufficient documentation

## 2015-02-11 DIAGNOSIS — R001 Bradycardia, unspecified: Secondary | ICD-10-CM | POA: Insufficient documentation

## 2015-02-11 HISTORY — PX: OLECRANON BURSECTOMY: SHX2097

## 2015-02-11 SURGERY — BURSECTOMY, ELBOW
Anesthesia: Regional | Site: Elbow | Laterality: Right

## 2015-02-11 MED ORDER — FENTANYL CITRATE (PF) 100 MCG/2ML IJ SOLN
INTRAMUSCULAR | Status: AC
  Filled 2015-02-11: qty 2

## 2015-02-11 MED ORDER — MIDAZOLAM HCL 5 MG/5ML IJ SOLN
INTRAMUSCULAR | Status: DC | PRN
  Administered 2015-02-11 (×2): 1 mg via INTRAVENOUS

## 2015-02-11 MED ORDER — 0.9 % SODIUM CHLORIDE (POUR BTL) OPTIME
TOPICAL | Status: DC | PRN
  Administered 2015-02-11: 1000 mL

## 2015-02-11 MED ORDER — FENTANYL CITRATE (PF) 100 MCG/2ML IJ SOLN
INTRAMUSCULAR | Status: DC | PRN
  Administered 2015-02-11 (×4): 25 ug via INTRAVENOUS

## 2015-02-11 MED ORDER — LIDOCAINE HCL (PF) 0.5 % IJ SOLN
INTRAMUSCULAR | Status: AC
  Filled 2015-02-11: qty 50

## 2015-02-11 MED ORDER — MIDAZOLAM HCL 2 MG/2ML IJ SOLN
1.0000 mg | INTRAMUSCULAR | Status: DC | PRN
  Administered 2015-02-11: 2 mg via INTRAVENOUS

## 2015-02-11 MED ORDER — ONDANSETRON HCL 4 MG/2ML IJ SOLN: 4.0000 mg | Freq: Once | INTRAMUSCULAR | Status: AC | PRN

## 2015-02-11 MED ORDER — OXYCODONE-ACETAMINOPHEN 5-325 MG PO TABS: 1.0000 | ORAL_TABLET | ORAL | Status: DC | PRN

## 2015-02-11 MED ORDER — MIDAZOLAM HCL 2 MG/2ML IJ SOLN
INTRAMUSCULAR | Status: AC
  Filled 2015-02-11: qty 2

## 2015-02-11 MED ORDER — BUPIVACAINE HCL (PF) 0.25 % IJ SOLN
INTRAMUSCULAR | Status: AC
  Filled 2015-02-11: qty 30

## 2015-02-11 MED ORDER — FENTANYL CITRATE (PF) 100 MCG/2ML IJ SOLN
25.0000 ug | INTRAMUSCULAR | Status: DC | PRN
Start: 2015-02-11 — End: 2015-02-12
  Administered 2015-02-11 (×2): 25 ug via INTRAVENOUS
  Administered 2015-02-11: 50 ug via INTRAVENOUS

## 2015-02-11 MED ORDER — PROPOFOL 10 MG/ML IV BOLUS
INTRAVENOUS | Status: AC
  Filled 2015-02-11: qty 20

## 2015-02-11 MED ORDER — PROPOFOL INFUSION 10 MG/ML OPTIME
INTRAVENOUS | Status: DC | PRN
  Administered 2015-02-11: 75 ug/kg/min via INTRAVENOUS

## 2015-02-11 MED ORDER — LACTATED RINGERS IV SOLN
INTRAVENOUS | Status: DC
  Administered 2015-02-11: 1000 mL via INTRAVENOUS
  Administered 2015-02-11: 08:00:00 via INTRAVENOUS

## 2015-02-11 MED ORDER — FENTANYL CITRATE (PF) 100 MCG/2ML IJ SOLN
25.0000 ug | INTRAMUSCULAR | Status: AC
  Administered 2015-02-11 (×2): 25 ug via INTRAVENOUS

## 2015-02-11 SURGICAL SUPPLY — 43 items
BAG HAMPER (MISCELLANEOUS) ×3 IMPLANT
BANDAGE ELASTIC 4 VELCRO NS (GAUZE/BANDAGES/DRESSINGS) ×3 IMPLANT
BANDAGE ELASTIC 4 VELCRO ST LF (GAUZE/BANDAGES/DRESSINGS) ×3 IMPLANT
BANDAGE ELASTIC 6 VELCRO NS (GAUZE/BANDAGES/DRESSINGS) ×3 IMPLANT
BANDAGE ESMARK 4X12 BL STRL LF (DISPOSABLE) ×1 IMPLANT
BLADE SURG 15 STRL LF DISP TIS (BLADE) ×2 IMPLANT
BLADE SURG 15 STRL SS (BLADE) ×4
BNDG COHESIVE 4X5 TAN STRL (GAUZE/BANDAGES/DRESSINGS) ×3 IMPLANT
BNDG ESMARK 4X12 BLUE STRL LF (DISPOSABLE) ×3
CLOTH BEACON ORANGE TIMEOUT ST (SAFETY) ×3 IMPLANT
COVER LIGHT HANDLE STERIS (MISCELLANEOUS) ×6 IMPLANT
CUFF TOURNIQUET SINGLE 18IN (TOURNIQUET CUFF) ×3 IMPLANT
DRAIN PENROSE 12X.25 LTX STRL (MISCELLANEOUS) ×3 IMPLANT
DURAPREP 26ML APPLICATOR (WOUND CARE) ×3 IMPLANT
ELECT REM PT RETURN 9FT ADLT (ELECTROSURGICAL) ×3
ELECTRODE REM PT RTRN 9FT ADLT (ELECTROSURGICAL) ×1 IMPLANT
FORMALIN 10 PREFIL 120ML (MISCELLANEOUS) ×3 IMPLANT
GAUZE SPONGE 4X4 12PLY STRL (GAUZE/BANDAGES/DRESSINGS) ×3 IMPLANT
GAUZE XEROFORM 5X9 LF (GAUZE/BANDAGES/DRESSINGS) ×3 IMPLANT
GLOVE BIO SURGEON STRL SZ8 (GLOVE) ×3 IMPLANT
GLOVE BIO SURGEON STRL SZ8.5 (GLOVE) ×3 IMPLANT
GLOVE BIOGEL PI IND STRL 7.0 (GLOVE) ×2 IMPLANT
GLOVE BIOGEL PI IND STRL 7.5 (GLOVE) ×1 IMPLANT
GLOVE BIOGEL PI INDICATOR 7.0 (GLOVE) ×4
GLOVE BIOGEL PI INDICATOR 7.5 (GLOVE) ×2
GLOVE ECLIPSE 6.5 STRL STRAW (GLOVE) ×3 IMPLANT
GOWN STRL REUS W/TWL LRG LVL3 (GOWN DISPOSABLE) ×6 IMPLANT
GOWN STRL REUS W/TWL XL LVL3 (GOWN DISPOSABLE) ×3 IMPLANT
KIT ROOM TURNOVER APOR (KITS) ×3 IMPLANT
MANIFOLD NEPTUNE II (INSTRUMENTS) ×3 IMPLANT
NS IRRIG 1000ML POUR BTL (IV SOLUTION) ×3 IMPLANT
PACK BASIC LIMB (CUSTOM PROCEDURE TRAY) ×3 IMPLANT
PAD ABD 5X9 TENDERSORB (GAUZE/BANDAGES/DRESSINGS) ×6 IMPLANT
PAD ARMBOARD 7.5X6 YLW CONV (MISCELLANEOUS) ×3 IMPLANT
PAD CAST 4YDX4 CTTN HI CHSV (CAST SUPPLIES) ×2 IMPLANT
PADDING CAST COTTON 4X4 STRL (CAST SUPPLIES) ×4
PADDING WEBRIL 4 STERILE (GAUZE/BANDAGES/DRESSINGS) ×6 IMPLANT
SET BASIN LINEN APH (SET/KITS/TRAYS/PACK) ×3 IMPLANT
SPONGE GAUZE 4X4 12PLY (GAUZE/BANDAGES/DRESSINGS) ×2 IMPLANT
SPONGE LAP 18X18 X RAY DECT (DISPOSABLE) ×3 IMPLANT
STAPLER VISISTAT 35W (STAPLE) ×3 IMPLANT
SUT CHROMIC 2 0 CT 1 (SUTURE) ×3 IMPLANT
SUT PLAIN CT 1/2CIR 2-0 27IN (SUTURE) ×3 IMPLANT

## 2015-02-11 NOTE — Progress Notes (Signed)
cc'ed to pcp °

## 2015-02-11 NOTE — Anesthesia Preprocedure Evaluation (Signed)
Anesthesia Evaluation  Patient identified by MRN, date of birth, ID band Patient awake    Reviewed: Allergy & Precautions, NPO status , Patient's Chart, lab work & pertinent test results  Airway Mallampati: I  TM Distance: >3 FB     Dental  (+) Edentulous Upper, Poor Dentition   Pulmonary Current Smoker,  breath sounds clear to auscultation        Cardiovascular hypertension, Pt. on medications Rhythm:Regular Rate:Normal     Neuro/Psych CVA (Left side weakness), Residual Symptoms    GI/Hepatic   Endo/Other    Renal/GU      Musculoskeletal  (+) Arthritis -,   Abdominal   Peds  Hematology   Anesthesia Other Findings   Reproductive/Obstetrics                             Anesthesia Physical Anesthesia Plan  ASA: III  Anesthesia Plan: Bier Block   Post-op Pain Management:    Induction: Intravenous  Airway Management Planned: Nasal Cannula  Additional Equipment:   Intra-op Plan:   Post-operative Plan:   Informed Consent: I have reviewed the patients History and Physical, chart, labs and discussed the procedure including the risks, benefits and alternatives for the proposed anesthesia with the patient or authorized representative who has indicated his/her understanding and acceptance.     Plan Discussed with:   Anesthesia Plan Comments:         Anesthesia Quick Evaluation

## 2015-02-11 NOTE — Op Note (Signed)
NAME:  Larry Reese, Larry Reese             ACCOUNT NO.:  [id number]  MEDICAL RECORD NO.:  [id number]  LOCATION:  [facility]                          FACILITY:  [facility]  PHYSICIAN:  J. Sanjuana Kava, M.D. DATE OF BIRTH:  09/10/1946  DATE OF PROCEDURE: DATE OF DISCHARGE:                              OPERATIVE REPORT   PREOPERATIVE DIAGNOSIS:  Right olecranon bursitis with large olecranon bursa.  POSTOPERATIVE DIAGNOSIS:  Right olecranon bursitis with large olecranon bursa.  PROCEDURE:  Excision of right olecranon bursa.  ANESTHESIA:  Regional block.  TOURNIQUET TIME:  36 minutes.  SURGEON:  J. Sanjuana Kava, M.D.  Niel HummerLeeroy Cha drain was placed in the elbow on the right on the medial aspect.  INDICATIONS:  The patient is a 69 year old male who has had olecranon bursa swelling for about 2 months.  I aspirated it on two occasions and it has come back as a size of a small lump.  It bothers him when he sits in his easy chair and he puts his elbow down against the hard surface. He would like to have it excised.  I went over the risks and imponderables prior to the procedure and he understood those.  I also discussed this with his wife.  They have elected to have the surgery today as an outpatient.  He understands there would be a Penrose drain placed and would be removed in the office the day after surgery.  I talked with him about possibility of infection and if there will be skin staples, will stay in for approximately 2 weeks.  He may have some slight swelling in the elbow for period of time for the hematoma resolves.  DESCRIPTION OF PROCEDURE:  The patient was seen in the holding area. The right arm was identified as correct surgical site.  I placed a mark on the right elbow.  The patient was brought back to the operating room, placed on the operating room table with armboard attached.  He was given Bier block anesthesia.  He was then prepped and draped in the  usual manner.  Generalized time-out identifying the patient, Mr. Brigham and doing his right elbow for olecranon bursa excision.  All instrumentation was properly positioned and working.  The OR team knew each other.  Outline for incision was made and then incision was made directly over the bursa.  With careful dissection, the bursa was exposed and then removed.  Had some fluid in it was removed.  There were no calcific deposits around the bursa.  Small stab wound was made medially in the skin and a Penrose drain was then placed.  The wound was then reapproximated using 2-0 chromic.  The skin was reapproximated using skin staples.  Sterile dressing applied, bulky dressing applied and several amount of ABDs and sheet cotton applied to the elbow as this will ooze blood this evening.  Given appropriate analgesia for pain.  I will see him in the office tomorrow.  If any difficulties, he should contact me through the office or the hospital beeper system after hours, he has those numbers.          ______________________________ Lenna Sciara. Sanjuana Kava, M.D.     JWK/MEDQ  D:  02/11/2015  T:  02/11/2015  Job:  [id number]

## 2015-02-11 NOTE — Brief Op Note (Signed)
02/11/2015  8:21 AM  PATIENT:  Larry Reese  69 y.o. male  PRE-OPERATIVE DIAGNOSIS:  right olecranon bursitis  POST-OPERATIVE DIAGNOSIS:  right olecranon bursitis  PROCEDURE:  Procedure(s): EXCISION RIGHT OLECRANON BURSA (Right)  SURGEON:  Surgeon(s) and Role:    * Sanjuana Kava, MD - Primary  PHYSICIAN ASSISTANT:   ASSISTANTS: none   ANESTHESIA:   regional  EBL:  Total I/O In: 400 [I.V.:400] Out: -   BLOOD ADMINISTERED:none  DRAINS: Penrose drain in the right elbow area   LOCAL MEDICATIONS USED:  NONE  SPECIMEN:  Source of Specimen:  right olecranon bursa  DISPOSITION OF SPECIMEN:  PATHOLOGY  COUNTS:  YES  TOURNIQUET:   Total Tourniquet Time Documented: Upper Arm (Right) - 36 minutes Total: Upper Arm (Right) - 36 minutes   DICTATION: .Other Dictation: Dictation Number [id number]  PLAN OF CARE: Discharge to home after PACU  PATIENT DISPOSITION:  PACU - hemodynamically stable.   Delay start of Pharmacological VTE agent (>24hrs) due to surgical blood loss or risk of bleeding: not applicable

## 2015-02-11 NOTE — Transfer of Care (Signed)
Immediate Anesthesia Transfer of Care Note  Patient: Larry Reese  Procedure(s) Performed: Procedure(s): EXCISION RIGHT OLECRANON BURSA (Right)  Patient Location: PACU  Anesthesia Type:Bier block  Level of Consciousness: awake, alert , oriented and patient cooperative  Airway & Oxygen Therapy: Patient Spontanous Breathing and Patient connected to nasal cannula oxygen  Post-op Assessment: Report given to RN, Post -op Vital signs reviewed and stable and Patient moving all extremities X 4  Post vital signs: Reviewed and stable  Last Vitals:  Filed Vitals:   02/11/15 0715  BP: 127/77  Pulse:   Temp:   Resp: 17    Complications: No apparent anesthesia complications

## 2015-02-11 NOTE — Anesthesia Procedure Notes (Signed)
Procedure Name: MAC Date/Time: 02/11/2015 7:37 AM Performed by: Michele Rockers Pre-anesthesia Checklist: Patient identified, Emergency Drugs available, Suction available, Timeout performed and Patient being monitored Patient Re-evaluated:Patient Re-evaluated prior to inductionOxygen Delivery Method: Nasal Cannula

## 2015-02-11 NOTE — Discharge Instructions (Signed)
Keep dressing dry and intact.  Take pain medicine as needed.  Call Dr. Brooke Bonito office at [phone] if any problem, or if after hours, the hospital at [phone].  See Dr. [name] tomorrow for removal of drain and application of new dressing.

## 2015-02-11 NOTE — Progress Notes (Signed)
The History and Physical is unchanged. I have examined the patient. The patient is medically able to have surgery on the right elbow . Larry Reese

## 2015-02-11 NOTE — Anesthesia Postprocedure Evaluation (Signed)
  Anesthesia Post-op Note  Patient: Larry Reese  Procedure(s) Performed: Procedure(s): EXCISION RIGHT OLECRANON BURSA (Right)  Patient Location: PACU  Anesthesia Type:Bier block  Level of Consciousness: awake, alert , oriented and responds to stimulation  Airway and Oxygen Therapy: Patient Spontanous Breathing and Patient connected to nasal cannula oxygen  Post-op Pain: none  Post-op Assessment: Post-op Vital signs reviewed, Patient's Cardiovascular Status Stable, Respiratory Function Stable, Patent Airway, No signs of Nausea or vomiting and Pain level controlled  Post-op Vital Signs: Reviewed and stable  Last Vitals:  Filed Vitals:   02/11/15 0715  BP: 127/77  Pulse:   Temp:   Resp: 17    Complications: No apparent anesthesia complications

## 2015-02-12 ENCOUNTER — Encounter (HOSPITAL_COMMUNITY): Payer: Self-pay | Admitting: Orthopaedic Surgery

## 2015-02-12 NOTE — Progress Notes (Signed)
NO PA needed for [place]

## 2015-02-18 ENCOUNTER — Other Ambulatory Visit (HOSPITAL_COMMUNITY): Payer: PPO

## 2015-03-05 ENCOUNTER — Ambulatory Visit (HOSPITAL_COMMUNITY)
Admit: 2015-03-05 | Discharge: 2015-03-05 | Disposition: A | Discharge status: Home / Self Care | Payer: PPO | Source: Ambulatory Visit | Attending: Gastroenterology | Admitting: Gastroenterology

## 2015-03-05 DIAGNOSIS — R945 Abnormal results of liver function studies: Secondary | ICD-10-CM

## 2015-03-05 DIAGNOSIS — R7989 Other specified abnormal findings of blood chemistry: Secondary | ICD-10-CM | POA: Diagnosis present

## 2015-03-05 DIAGNOSIS — Z86010 Personal history of colonic polyps: Secondary | ICD-10-CM | POA: Diagnosis not present

## 2015-03-05 DIAGNOSIS — F1010 Alcohol abuse, uncomplicated: Secondary | ICD-10-CM | POA: Diagnosis not present

## 2015-03-07 NOTE — Progress Notes (Signed)
Quick Note:  Please let patient know that his U/S shows fat and inflammation in the liver from his etoh use. No cirrhosis at this time but he is at risk based on u/s findings.   Please request the patient to have the labs done that I ordered. What Dr. [name] ordered recently was different than what I needed so he needs to do mine as well.   Return to the office in 4 months to see Dr. Oneida Alar. He can schedule colonoscopy at that time or if he wants to pursue earlier than he can let me know. ______

## 2015-03-26 ENCOUNTER — Telehealth: Payer: Self-pay

## 2015-03-26 NOTE — Telephone Encounter (Signed)
Labs received from [facility]. Given to Laban Emperor NP in [place] absence.

## 2015-03-27 NOTE — Telephone Encounter (Signed)
Labs reviewed. Iron normal at 61, ferritin mildly elevated at 410. Negative autoimmune/PBC. Immune to Hep A per labs (and per epic documentation is also immune to Hep B).   Will have Magda Paganini provide further recommendations regarding any further work-up.

## 2015-03-31 NOTE — Telephone Encounter (Addendum)
Also, recent LFTs with Dr. [name] and WBC unremarkable.   Patient is immune to Hep A and B.  Needs to cut back on etoh use.  Instructions for fatty liver: Recommend 1-2# weight loss per week until ideal body weight through exercise & diet. Low fat/cholesterol diet.   Avoid sweets, sodas, fruit juices, sweetened beverages like tea, etc. Gradually increase exercise from 15 min daily up to 1 hr per day 5 days/week. Limit alcohol use.  Keep OV with [facility] in 05/2015 as planned. Consider surveillance colonoscopy at that time.

## 2015-04-02 NOTE — Telephone Encounter (Signed)
LMOM to call.

## 2015-04-03 NOTE — Telephone Encounter (Signed)
LMOM to call and letter mailed to call also.  

## 2015-04-07 NOTE — Telephone Encounter (Signed)
Pt called because he received the letter in the mail. He is aware of the results ans know about his follow up appointment

## 2015-04-15 ENCOUNTER — Encounter: Payer: Self-pay | Admitting: Gastroenterology

## 2015-05-28 ENCOUNTER — Ambulatory Visit (INDEPENDENT_AMBULATORY_CARE_PROVIDER_SITE_OTHER): Payer: PPO | Admitting: Gastroenterology

## 2015-05-28 ENCOUNTER — Encounter: Payer: Self-pay | Admitting: Gastroenterology

## 2015-05-28 ENCOUNTER — Other Ambulatory Visit: Payer: Self-pay

## 2015-05-28 VITALS — BP 150/81 | HR 84 | Temp 98.1°F | Ht 79.0 in | Wt 216.8 lb

## 2015-05-28 DIAGNOSIS — K648 Other hemorrhoids: Secondary | ICD-10-CM | POA: Diagnosis not present

## 2015-05-28 DIAGNOSIS — D126 Benign neoplasm of colon, unspecified: Secondary | ICD-10-CM

## 2015-05-28 DIAGNOSIS — F1010 Alcohol abuse, uncomplicated: Secondary | ICD-10-CM | POA: Diagnosis not present

## 2015-05-28 DIAGNOSIS — Z86010 Personal history of colonic polyps: Secondary | ICD-10-CM

## 2015-05-28 DIAGNOSIS — K7010 Alcoholic hepatitis without ascites: Secondary | ICD-10-CM

## 2015-05-28 MED ORDER — NA SULFATE-K SULFATE-MG SULF 17.5-3.13-1.6 GM/177ML PO SOLN: ORAL | Status: DC

## 2015-05-28 NOTE — Assessment & Plan Note (Signed)
LIVER ENZYMES NL IN MAY 2016  HFP Q6 MOS ETOH IN MODERATION. DISCUSSED WITH WIFE AN DPT.

## 2015-05-28 NOTE — Progress Notes (Signed)
   Subjective:    Patient ID: Larry Reese, male    DOB: 06-20-1946, 69 y.o.   MRN: [id number]  [name],TESFAYE, MD  HPI LAST SEEN MAY 2016 FOR ELEVATED LIVER ENZYMES. STILL DRINKING ETOH. SUBJECTIVE FEVER AND CHILLS OFF AND ON WHEN HE DRINKS TOO MUCH. NAUSEA/VOMITING AFTER DRINKS TOO MUCH: TWICE A MO. BMs: EVERY OTHER DAY. RARE HEARTBURN  PT DENIES HEMATOCHEZIA, HEMATEMESIS, melena, diarrhea, CHEST PAIN, SHORTNESS OF BREATH,  CHANGE IN BOWEL IN HABITS, constipation, abdominal pain, problems swallowing, OR problems with sedation.  Past Medical History  Diagnosis Date  . Hypertension   . Pneumothorax   . Hx of adenomatous colonic polyps   . Stroke 2005    left sided weakness  . Arthritis   . Trigger finger    Past Surgical History  Procedure Laterality Date  . Colonoscopy  May 2010    Dr. Raniyah Curenton: 5 cm terminal ileum normal, 6 polyps removed, moderate internal hemorrhoids, simple adenomas, next colonoscopy May 2015  . Craniotomy  2000    pressure  . Inguinal hernia repair Right   . Repair of trigger finger Left     pinky  . Olecranon bursectomy Right 02/11/2015    Procedure: EXCISION RIGHT OLECRANON BURSA;  Surgeon: Sanjuana Kava, MD;  Location: [facility];  Service: Orthopedics;  Laterality: Right;   Allergies  Allergen Reactions  . Aleve [Naproxen] Other (See Comments)    Numbness in extremities   . Celecoxib Other (See Comments)    Nose bleed.  . Fluoxetine Hcl Swelling    Current Outpatient Prescriptions  Medication Sig Dispense Refill  . ALPRAZolam (XANAX) 0.5 MG tablet Take 0.5 mg by mouth at bedtime as needed for sleep.  RARELY   . amLODipine (NORVASC) 5 MG tablet Take 5 mg by mouth daily.    Marland Kitchen aspirin 81 MG tablet Take 81 mg by mouth daily.    . Chlorpheniramine Maleate (ALLERGY PO) Take 1 tablet by mouth daily as needed (allergies).    Marland Kitchen HYDROCODONE/ACETAMIOPHEN Take 1 tablet by mouth every 4 (four) hours as needed for severe pain.     Review of Systems PER HPI  OTHERWISE ALL SYSTEMS ARE NEGATIVE.     Objective:   Physical Exam  Constitutional: He is oriented to person, place, and time. He appears well-developed and well-nourished. No distress.  HENT:  Head: Normocephalic and atraumatic.  Mouth/Throat: Oropharynx is clear and moist. No oropharyngeal exudate.  Eyes: Pupils are equal, round, and reactive to light. No scleral icterus.  Neck: Normal range of motion. Neck supple.  Cardiovascular: Normal rate, regular rhythm and normal heart sounds.   Pulmonary/Chest: Effort normal and breath sounds normal. No respiratory distress.  Abdominal: Soft. Bowel sounds are normal. He exhibits no distension. There is no tenderness.  Musculoskeletal: He exhibits no edema.  R ELBOW INCISION WELL HEALED. DEFORMITY OVER LEFT OLECRANON BURSA  Lymphadenopathy:    He has no cervical adenopathy.  Neurological: He is alert and oriented to person, place, and time.  NO FOCAL DEFICITS   Psychiatric:  FLAT AFFECT, NORMAL MOOD   Vitals reviewed.         Assessment & Plan:

## 2015-05-28 NOTE — Assessment & Plan Note (Signed)
>   3 IN 2010.  NEEDS TCS FRI NOV 11 PER PT REQUEST. DISCUSSED PROCEDURE, BENEFITS, AND RISKS OF PROCEDURE. PHENERGAN 12.5 MG IV IN PREOP. FULL LIQUIDS NOV 10. [place] 10.

## 2015-05-28 NOTE — Assessment & Plan Note (Signed)
LIVER ENZYMES/PLATELET COUNT NORMAL IN MAY AND U/S SHOWS FATTY LIVER.? FIBROSIS BUT STUDY LIMITED IN SETTING OF STEATOSIS.  CONTINUE TO MONITOR SYMPTOMS. LIVER PANEL IN NOV 2016 OUTPATIENT VISIT IN MAY 2017

## 2015-05-28 NOTE — Progress Notes (Signed)
REVIEWED-NO ADDITIONAL RECOMMENDATIONS. 

## 2015-05-28 NOTE — Progress Notes (Signed)
ON RECALL  °

## 2015-05-28 NOTE — Assessment & Plan Note (Signed)
SYMPTOMS CONTROLLED/RESOLVED.  CONTINUE TO MONITOR SYMPTOMS. 

## 2015-05-28 NOTE — Progress Notes (Signed)
CC'ED TO PCP 

## 2015-05-28 NOTE — Patient Instructions (Signed)
DRINK WATER TO KEEP YOUR URINE LIGHT YELLOW.  GET YOUR LIVER ENZYMES CHECKED IN NOV 2016.  FULL LIQUID DIET BEGINNING WITH BREAKFAST ON THURSDAY NOV 10. TAKE BOWEL PREP ON NOV 10.  COLONOSCOPY Friday NOV 11.  FOLLOW UP IN MAY 2016.    Full Liquid Diet A high-calorie, high-protein supplement should be used to meet your nutritional requirements when the full liquid diet is continued for more than 2 or 3 days. If this diet is to be used for an extended period of time (more than 7 days), a multivitamin should be considered.  Breads and Starches  Allowed: None are allowed except crackers WHOLE OR pureed (made into a thick, smooth soup) in soup.   Avoid: Any others.    Potatoes/Pasta/Rice  Allowed: ANY ITEM AS A SOUP OR SMALL PLATE OF MASHED POTATOES.       Vegetables  Allowed: Strained tomato or vegetable juice. Vegetables pureed in soup.   Avoid: Any others.    Fruit  Allowed: Any strained fruit juices and fruit drinks. Include 1 serving of citrus or vitamin C-enriched fruit juice daily.   Avoid: Any others.  Meat and Meat Substitutes  Allowed: Egg  Avoid: Any meat, fish, or fowl. All cheese.  Milk  Allowed: Milk beverages, including milk shakes and instant breakfast mixes. Smooth yogurt.   Avoid: Any others. Avoid dairy products if not tolerated.    Soups and Combination Foods  Allowed: Broth, strained cream soups. Strained, broth-based soups.   Avoid: Any others.    Desserts and Sweets  Allowed: flavored gelatin,plain ice cream, sherbet, smooth pudding, junket, fruit ices, frozen ice pops, pudding pops,, frozen fudge pops, chocolate syrup. Sugar, honey, jelly, syrup.   Avoid: Any others.  Fats and Oils  Allowed: Margarine, butter, cream, sour cream, oils.   Avoid: Any others.  Beverages  Allowed: All.   Avoid: None.  Condiments  Allowed: Iodized salt, pepper, spices, flavorings. Cocoa powder.   Avoid: Any others.    SAMPLE MEAL  PLAN Breakfast   cup orange juice.   1 OR 2 EGGS   1 cup  milk.   1 cup beverage (coffee or tea).   Cream or sugar, if desired.    Midmorning Snack  2 SCRAMBLED OR HARD BOILED EGG   Lunch  1 cup cream soup.    cup fruit juice.   1 cup milk.    cup custard.   1 cup beverage (coffee or tea).   Cream or sugar, if desired.    Midafternoon Snack  1 cup milk shake.  Dinner  1 cup cream soup.    cup fruit juice.   1 cup milk.    cup pudding.   1 cup beverage (coffee or tea).   Cream or sugar, if desired.  Evening Snack  1 cup supplement.  To increase calories, add sugar, cream, butter, or margarine if possible. Nutritional supplements will also increase the total calories.

## 2015-07-28 ENCOUNTER — Telehealth: Payer: Self-pay

## 2015-07-28 NOTE — Telephone Encounter (Signed)
Pt denies any changes to medical history. Medications reviewed.

## 2015-07-31 NOTE — Telephone Encounter (Signed)
REVIEWED-NO ADDITIONAL RECOMMENDATIONS. 

## 2015-08-01 ENCOUNTER — Encounter (HOSPITAL_COMMUNITY): Payer: Self-pay | Admitting: *Deleted

## 2015-08-01 ENCOUNTER — Ambulatory Visit (HOSPITAL_COMMUNITY)
Admission: RE | Admit: 2015-08-01 | Discharge: 2015-08-01 | Disposition: A | Discharge status: Home / Self Care | Payer: PPO | Source: Ambulatory Visit | Attending: Gastroenterology | Admitting: Gastroenterology

## 2015-08-01 ENCOUNTER — Encounter (HOSPITAL_COMMUNITY)
Admission: RE | Disposition: A | Discharge status: Home / Self Care | Payer: Self-pay | Source: Ambulatory Visit | Attending: Gastroenterology

## 2015-08-01 DIAGNOSIS — Z860100 Personal history of colon polyps, unspecified: Secondary | ICD-10-CM | POA: Insufficient documentation

## 2015-08-01 DIAGNOSIS — Z8673 Personal history of transient ischemic attack (TIA), and cerebral infarction without residual deficits: Secondary | ICD-10-CM | POA: Diagnosis not present

## 2015-08-01 DIAGNOSIS — F17200 Nicotine dependence, unspecified, uncomplicated: Secondary | ICD-10-CM | POA: Diagnosis not present

## 2015-08-01 DIAGNOSIS — D125 Benign neoplasm of sigmoid colon: Secondary | ICD-10-CM | POA: Insufficient documentation

## 2015-08-01 DIAGNOSIS — Z1211 Encounter for screening for malignant neoplasm of colon: Secondary | ICD-10-CM | POA: Insufficient documentation

## 2015-08-01 DIAGNOSIS — K621 Rectal polyp: Secondary | ICD-10-CM

## 2015-08-01 DIAGNOSIS — D123 Benign neoplasm of transverse colon: Secondary | ICD-10-CM | POA: Insufficient documentation

## 2015-08-01 DIAGNOSIS — M1990 Unspecified osteoarthritis, unspecified site: Secondary | ICD-10-CM | POA: Diagnosis not present

## 2015-08-01 DIAGNOSIS — Z800 Family history of malignant neoplasm of digestive organs: Secondary | ICD-10-CM | POA: Diagnosis present

## 2015-08-01 DIAGNOSIS — Z7982 Long term (current) use of aspirin: Secondary | ICD-10-CM | POA: Insufficient documentation

## 2015-08-01 DIAGNOSIS — K648 Other hemorrhoids: Secondary | ICD-10-CM | POA: Diagnosis not present

## 2015-08-01 DIAGNOSIS — D122 Benign neoplasm of ascending colon: Secondary | ICD-10-CM | POA: Insufficient documentation

## 2015-08-01 DIAGNOSIS — Z79899 Other long term (current) drug therapy: Secondary | ICD-10-CM | POA: Insufficient documentation

## 2015-08-01 DIAGNOSIS — I10 Essential (primary) hypertension: Secondary | ICD-10-CM | POA: Insufficient documentation

## 2015-08-01 DIAGNOSIS — K635 Polyp of colon: Secondary | ICD-10-CM | POA: Diagnosis not present

## 2015-08-01 HISTORY — PX: COLONOSCOPY: SHX5424

## 2015-08-01 SURGERY — COLONOSCOPY
Anesthesia: Moderate Sedation

## 2015-08-01 MED ORDER — STERILE WATER FOR IRRIGATION IR SOLN
Status: DC | PRN
  Administered 2015-08-01: 09:00:00

## 2015-08-01 MED ORDER — MIDAZOLAM HCL 5 MG/5ML IJ SOLN
INTRAMUSCULAR | Status: AC
  Filled 2015-08-01: qty 10

## 2015-08-01 MED ORDER — SODIUM CHLORIDE 0.9 % IV SOLN
INTRAVENOUS | Status: DC
  Administered 2015-08-01: 08:00:00 via INTRAVENOUS

## 2015-08-01 MED ORDER — PROMETHAZINE HCL 25 MG/ML IJ SOLN
INTRAMUSCULAR | Status: AC
  Filled 2015-08-01: qty 1

## 2015-08-01 MED ORDER — MEPERIDINE HCL 100 MG/ML IJ SOLN
INTRAMUSCULAR | Status: DC | PRN
  Administered 2015-08-01: 50 mg via INTRAVENOUS
  Administered 2015-08-01: 25 mg via INTRAVENOUS

## 2015-08-01 MED ORDER — SODIUM CHLORIDE 0.9 % IJ SOLN
INTRAMUSCULAR | Status: AC
  Filled 2015-08-01: qty 3

## 2015-08-01 MED ORDER — PROMETHAZINE HCL 25 MG/ML IJ SOLN
12.5000 mg | Freq: Once | INTRAMUSCULAR | Status: AC
  Administered 2015-08-01: 12.5 mg via INTRAVENOUS

## 2015-08-01 MED ORDER — MEPERIDINE HCL 100 MG/ML IJ SOLN
INTRAMUSCULAR | Status: AC
  Filled 2015-08-01: qty 2

## 2015-08-01 MED ORDER — MIDAZOLAM HCL 5 MG/5ML IJ SOLN
INTRAMUSCULAR | Status: DC | PRN
  Administered 2015-08-01 (×2): 2 mg via INTRAVENOUS

## 2015-08-01 NOTE — Discharge Instructions (Signed)
You had 8 polyps removed. You have internal hemorrhoids.   FOLLOW A HIGH FIBER DIET. AVOID ITEMS THAT CAUSE BLOATING & GAS. SEE INFO BELOW.  YOUR BIOPSY RESULTS WILL BE AVAILABLE IN MY CHART AFTER nov 15 AND MY OFFICE WILL CONTACT YOU IN 10-14 DAYS WITH YOUR RESULTS.   Next colonoscopy in 1-3 years.   Colonoscopy Care After Read the instructions outlined below and refer to this sheet in the next week. These discharge instructions provide you with general information on caring for yourself after you leave the hospital. While your treatment has been planned according to the most current medical practices available, unavoidable complications occasionally occur. If you have any problems or questions after discharge, call DR. Michaiah Holsopple, [phone].  ACTIVITY  You may resume your regular activity, but move at a slower pace for the next 24 hours.   Take frequent rest periods for the next 24 hours.   Walking will help get rid of the air and reduce the bloated feeling in your belly (abdomen).   No driving for 24 hours (because of the medicine (anesthesia) used during the test).   You may shower.   Do not sign any important legal documents or operate any machinery for 24 hours (because of the anesthesia used during the test).    NUTRITION  Drink plenty of fluids.   You may resume your normal diet as instructed by your doctor.   Begin with a light meal and progress to your normal diet. Heavy or fried foods are harder to digest and may make you feel sick to your stomach (nauseated).   Avoid alcoholic beverages for 24 hours or as instructed.    MEDICATIONS  You may resume your normal medications.   WHAT YOU CAN EXPECT TODAY  Some feelings of bloating in the abdomen.   Passage of more gas than usual.   Spotting of blood in your stool or on the toilet paper  .  IF YOU HAD POLYPS REMOVED DURING THE COLONOSCOPY:  Eat a soft diet IF YOU HAVE NAUSEA, BLOATING, ABDOMINAL PAIN, OR  VOMITING.    FINDING OUT THE RESULTS OF YOUR TEST Not all test results are available during your visit. DR. Oneida Alar WILL CALL YOU WITHIN 14 DAYS OF YOUR PROCEDUE WITH YOUR RESULTS. Do not assume everything is normal if you have not heard from DR. Princeton Nabor, CALL HER OFFICE AT [phone].  SEEK IMMEDIATE MEDICAL ATTENTION AND CALL THE OFFICE: [phone] IF:  You have more than a spotting of blood in your stool.   Your belly is swollen (abdominal distention).   You are nauseated or vomiting.   You have a temperature over 101F.   You have abdominal pain or discomfort that is severe or gets worse throughout the day.   High-Fiber Diet A high-fiber diet changes your normal diet to include more whole grains, legumes, fruits, and vegetables. Changes in the diet involve replacing refined carbohydrates with unrefined foods. The calorie level of the diet is essentially unchanged. The Dietary Reference Intake (recommended amount) for adult males is 38 grams per day. For adult females, it is 25 grams per day. Pregnant and lactating women should consume 28 grams of fiber per day. Fiber is the intact part of a plant that is not broken down during digestion. Functional fiber is fiber that has been isolated from the plant to provide a beneficial effect in the body. PURPOSE  Increase stool bulk.   Ease and regulate bowel movements.   Lower cholesterol.  REDUCE RISK  OF COLON CANCER  INDICATIONS THAT YOU NEED MORE FIBER  Constipation and hemorrhoids.   Uncomplicated diverticulosis (intestine condition) and irritable bowel syndrome.   Weight management.   As a protective measure against hardening of the arteries (atherosclerosis), diabetes, and cancer.   GUIDELINES FOR INCREASING FIBER IN THE DIET  Start adding fiber to the diet slowly. A gradual increase of about 5 more grams (2 slices of whole-wheat bread, 2 servings of most fruits or vegetables, or 1 bowl of high-fiber cereal) per day is  best. Too rapid an increase in fiber may result in constipation, flatulence, and bloating.   Drink enough water and fluids to keep your urine clear or pale yellow. Water, juice, or caffeine-free drinks are recommended. Not drinking enough fluid may cause constipation.   Eat a variety of high-fiber foods rather than one type of fiber.   Try to increase your intake of fiber through using high-fiber foods rather than fiber pills or supplements that contain small amounts of fiber.   The goal is to change the types of food eaten. Do not supplement your present diet with high-fiber foods, but replace foods in your present diet.   INCLUDE A VARIETY OF FIBER SOURCES  Replace refined and processed grains with whole grains, canned fruits with fresh fruits, and incorporate other fiber sources. White rice, white breads, and most bakery goods contain little or no fiber.   Brown whole-grain rice, buckwheat oats, and many fruits and vegetables are all good sources of fiber. These include: broccoli, Brussels sprouts, cabbage, cauliflower, beets, sweet potatoes, white potatoes (skin on), carrots, tomatoes, eggplant, squash, berries, fresh fruits, and dried fruits.   Cereals appear to be the richest source of fiber. Cereal fiber is found in whole grains and bran. Bran is the fiber-rich outer coat of cereal grain, which is largely removed in refining. In whole-grain cereals, the bran remains. In breakfast cereals, the largest amount of fiber is found in those with "bran" in their names. The fiber content is sometimes indicated on the label.   You may need to include additional fruits and vegetables each day.   In baking, for 1 cup white flour, you may use the following substitutions:   1 cup whole-wheat flour minus 2 tablespoons.   1/2 cup white flour plus 1/2 cup whole-wheat flour.   Polyps, Colon  A polyp is extra tissue that grows inside your body. Colon polyps grow in the large intestine. The large  intestine, also called the colon, is part of your digestive system. It is a long, hollow tube at the end of your digestive tract where your body makes and stores stool. Most polyps are not dangerous. They are benign. This means they are not cancerous. But over time, some types of polyps can turn into cancer. Polyps that are smaller than a pea are usually not harmful. But larger polyps could someday become or may already be cancerous. To be safe, doctors remove all polyps and test them.   PREVENTION There is not one sure way to prevent polyps. You might be able to lower your risk of getting them if you:  Eat more fruits and vegetables and less fatty food.   Do not smoke.   Avoid alcohol.   Exercise every day.   Lose weight if you are overweight.   Eating more calcium and folate can also lower your risk of getting polyps. Some foods that are rich in calcium are milk, cheese, and broccoli. Some foods that are rich in  folate are chickpeas, kidney beans, and spinach.   Hemorrhoids Hemorrhoids are dilated (enlarged) veins around the rectum. Sometimes clots will form in the veins. This makes them swollen and painful. These are called thrombosed hemorrhoids. Causes of hemorrhoids include:  Constipation.   Straining to have a bowel movement.   HEAVY LIFTING  HOME CARE INSTRUCTIONS  Eat a well balanced diet and drink 6 to 8 glasses of water every day to avoid constipation. You may also use a bulk laxative.   Avoid straining to have bowel movements.   Keep anal area dry and clean.   Do not use a donut shaped pillow or sit on the toilet for long periods. This increases blood pooling and pain.   Move your bowels when your body has the urge; this will require less straining and will decrease pain and pressure.

## 2015-08-01 NOTE — Op Note (Signed)
[facility] [address] [place], [address]   COLONOSCOPY PROCEDURE REPORT  PATIENT: Larry, Reese  MR#: [id number] BIRTHDATE: 1945-10-24 , 69  yrs. old GENDER: male ENDOSCOPIST: Danie Binder, MD REFERRED [id number] [name], M.D. PROCEDURE DATE:  2015/08/23 PROCEDURE:   Colonoscopy with cold biopsy and  with snare polypectomy INDICATIONS:high risk patient with personal history of colonic polyps. MEDICATIONS: Promethazine (Phenergan) 12.5 mg IV, Demerol 75 mg IV, and Versed 4 mg IV  DESCRIPTION OF PROCEDURE:    Physical exam was performed.  Informed consent was obtained from the patient after explaining the benefits, risks, and alternatives to procedure.  The patient was connected to monitor and placed in left lateral position. Continuous oxygen was provided by nasal cannula and IV medicine administered through an indwelling cannula.  After administration of sedation and rectal exam, the patients rectum was intubated and the [id number]  colonoscope was advanced under direct visualization to the cecum.  The scope was removed slowly by carefully examining the color, texture, anatomy, and integrity mucosa on the way out.  The patient was recovered in endoscopy and discharged home in satisfactory condition. Estimated blood loss is zero unless otherwise noted in this procedure report.    COLON FINDINGS: Three sessile polyps ranging from 6 to 25mm in size were found in the ascending colon(1) and at the hepatic flexure(2). A polypectomy was performed using snare cautery.  , Four sessile polyps ranging from 5 to 61mm in size were found in the rectum(3) and sigmoid colon(1).  A polypectomy was performed using snare cautery.  , Two sessile polyps ranging from 2 to 70mm in size were found in the rectum(2).  A polypectomy was performed with cold forceps.  , and Moderate sized internal hemorrhoids were found.  PREP QUALITY: excellent.  CECAL W/D TIME: 32        minutes COMPLICATIONS: None  ENDOSCOPIC IMPRESSION: 1.   NINE COLORECTAL POLYPS REMOVED 2.   Moderate sized internal hemorrhoids 3.   REDUNDANT LEFT COLON  RECOMMENDATIONS: FOLLOW A HIGH FIBER DIET. AWAIT BIOPSY RESULTS. Next colonoscopy in 1-3 years.      _______________________________ Lorrin MaisDanie Binder, MD August 23, 2015 3:09 PM    CPT CODES: ICD CODES:  The ICD and CPT codes recommended by this software are interpretations from the data that the clinical staff has captured with the software.  The verification of the translation of this report to the ICD and CPT codes and modifiers is the sole responsibility of the health care institution and practicing physician where this report was generated.  [employer]. will not be held responsible for the validity of the ICD and CPT codes included on this report.  AMA assumes no liability for data contained or not contained herein. CPT is a Designer, television/film set of the [employer].

## 2015-08-01 NOTE — H&P (Signed)
Primary Care Physician:  [name], MD Primary Gastroenterologist:  Dr. Oneida Alar  Pre-Procedure History & Physical: HPI:  Larry Reese is a 69 y.o. male here for  PERSONAL HISTORY OF POLYPS.  Past Medical History  Diagnosis Date  . Hypertension   . Pneumothorax   . Hx of adenomatous colonic polyps   . Stroke [facility]) 2005    left sided weakness  . Arthritis   . Trigger finger     Past Surgical History  Procedure Laterality Date  . Colonoscopy  May 2010    Dr. Taleigha Pinson: 5 cm terminal ileum normal, 6 polyps removed, moderate internal hemorrhoids, simple adenomas, next colonoscopy May 2015  . Craniotomy  2000    pressure  . Inguinal hernia repair Right   . Repair of trigger finger Left     pinky  . Olecranon bursectomy Right 02/11/2015    Procedure: EXCISION RIGHT OLECRANON BURSA;  Surgeon: Sanjuana Kava, MD;  Location: [facility];  Service: Orthopedics;  Laterality: Right;    Prior to Admission medications   Medication Sig Start Date End Date Taking? Authorizing Provider  ALPRAZolam Duanne [name]) 0.5 MG tablet Take 0.5 mg by mouth at bedtime as needed for sleep.    Yes Historical Provider, MD  amLODipine (NORVASC) 5 MG tablet Take 5 mg by mouth daily.   Yes Historical Provider, MD  aspirin EC 81 MG tablet Take 81 mg by mouth daily.   Yes Historical Provider, MD  Na Sulfate-K Sulfate-Mg Sulf SOLN USE AS DIRECTED 05/28/15  Yes Danie Binder, MD  Chlorpheniramine Maleate (ALLERGY PO) Take 1 tablet by mouth daily as needed (allergies).    Historical Provider, MD  HYDROcodone-acetaminophen (NORCO) 7.5-325 MG tablet Take 1 tablet by mouth every 6 (six) hours as needed for moderate pain.    Historical Provider, MD  oxyCODONE-acetaminophen (ROXICET) 5-325 MG per tablet Take 1 tablet by mouth every 4 (four) hours as needed for severe pain. Patient not taking: Reported on 07/30/2015 02/11/15   Sanjuana Kava, MD    Allergies as of 05/28/2015 - Review Complete 05/28/2015  Allergen Reaction Noted   . Aleve [naproxen] Other (See Comments) 02/10/2015  . Celecoxib Other (See Comments)   . Fluoxetine hcl Swelling 07/15/2009    Family History  Problem Relation Age of Onset  . Esophageal cancer Father   . Colon cancer Neg Hx     Social History   Social History  . Marital Status: Married    Spouse Name: N/A  . Number of Children: 3  . Years of Education: N/A   Occupational History  . retired    Social History Main Topics  . Smoking status: Current Every Day Smoker -- 0.75 packs/day for 50 years    Types: Cigarettes  . Smokeless tobacco: Not on file  . Alcohol Use: 3.0 oz/week    0 Standard drinks or equivalent, 5 Cans of beer per week     Comment: 4-6 per day (12 ounces)  . Drug Use: No  . Sexual Activity: Yes    Birth Control/ Protection: None   Other Topics Concern  . Not on file   Social History Narrative    Review of Systems: See HPI, otherwise negative ROS   Physical Exam: BP 158/97 mmHg  Pulse 85  Temp(Src) 98.5 F (36.9 C) (Oral)  Resp 23  Ht 6\' 7"  (2.007 m)  Wt 218 lb (98.884 kg)  BMI 24.55 kg/m2  SpO2 98% General:   Alert,  pleasant and cooperative in NAD Head:  Normocephalic and atraumatic. Neck:  Supple; Lungs:  Clear throughout to auscultation.    Heart:  Regular rate and rhythm. Abdomen:  Soft, nontender and nondistended. Normal bowel sounds, without guarding, and without rebound.   Neurologic:  Alert and  oriented x4;  grossly normal neurologically.  Impression/Plan:    PERSONAL HISTORY OF POLYPS.  PLAN:  1. TCS TODAY

## 2015-08-05 ENCOUNTER — Telehealth: Payer: Self-pay | Admitting: Gastroenterology

## 2015-08-05 NOTE — Telephone Encounter (Signed)
Please call pt. He had THREE simple adenoma & 6 HYPERPLASTIC POLYPS removed.   FOLLOW A HIGH FIBER DIET. AVOID ITEMS THAT CAUSE BLOATING & GAS.   Next colonoscopy in 3 years. YOUR SISTERS, BROTHERS, CHILDREN, AND PARENTS NEED TO HAVE A COLONOSCOPY STARTING AT THE AGE OF 40.

## 2015-08-05 NOTE — Telephone Encounter (Signed)
Tried to call, line busy

## 2015-08-05 NOTE — Telephone Encounter (Signed)
Reminder in epic °

## 2015-08-06 ENCOUNTER — Encounter (HOSPITAL_COMMUNITY): Payer: Self-pay | Admitting: Gastroenterology

## 2015-08-06 NOTE — Telephone Encounter (Signed)
Could not reach pt by phone. Mailing a letter to call.

## 2015-08-11 ENCOUNTER — Telehealth: Payer: Self-pay | Admitting: Gastroenterology

## 2015-08-11 NOTE — Telephone Encounter (Signed)
Patient came in the office Friday after we had closed wanting to speak with DS. Patient's wife called today asking to speak with DS. I told her that DS has the paper that the patient had left here Friday and she is aware and would call him back. They are out of town and asked to call this number [phone]

## 2015-08-12 NOTE — Telephone Encounter (Signed)
LATE ENTRY: I spoke to pt's wife yesterday and informed of the results from procedure. See note of 08/05/2015.

## 2015-10-17 DIAGNOSIS — I10 Essential (primary) hypertension: Secondary | ICD-10-CM | POA: Diagnosis not present

## 2015-10-17 DIAGNOSIS — M549 Dorsalgia, unspecified: Secondary | ICD-10-CM | POA: Diagnosis not present

## 2015-10-17 DIAGNOSIS — M1990 Unspecified osteoarthritis, unspecified site: Secondary | ICD-10-CM | POA: Diagnosis not present

## 2015-11-19 ENCOUNTER — Ambulatory Visit (INDEPENDENT_AMBULATORY_CARE_PROVIDER_SITE_OTHER): Payer: PPO | Admitting: Orthopaedic Surgery

## 2015-11-19 ENCOUNTER — Encounter: Payer: Self-pay | Admitting: Orthopaedic Surgery

## 2015-11-19 VITALS — BP 152/102 | HR 83 | Temp 97.9°F | Resp 16 | Ht 79.0 in | Wt 215.8 lb

## 2015-11-19 DIAGNOSIS — M25512 Pain in left shoulder: Secondary | ICD-10-CM

## 2015-11-19 MED ORDER — HYDROCODONE-ACETAMINOPHEN 7.5-325 MG PO TABS: 1.0000 | ORAL_TABLET | ORAL | Status: DC | PRN

## 2015-11-19 NOTE — Progress Notes (Addendum)
Patient [id number] Larry Reese, male DOB:23-Nov-1945, 70 y.o. [id number]  Chief Complaint  Patient presents with  . Follow-up    follow up left shoulder    HPI  Larry Reese is a 70 y.o. male who has chronic pain of the left shoulder.  He has no new acute injury. He has no redness or paresthesias.   Shoulder Pain  The pain is present in the left shoulder. This is a chronic problem. The current episode started more than 1 year ago. There has been no history of extremity trauma. The problem occurs daily. The problem has been waxing and waning. The quality of the pain is described as aching and dull. The pain is at a severity of 4/10. The pain is moderate. He has tried acetaminophen, oral narcotics, rest, NSAIDS and OTC ointments for the symptoms. The treatment provided mild relief.    Body mass index is 24.3 kg/(m^2).  Review of Systems  Patient does not have Diabetes Mellitus. Patient has hypertension. Patient does not have COPD or shortness of breath. Patient does not have BMI > 35. Patient has current smoking history.  Review of Systems  HENT: Negative for congestion.   Respiratory: Positive for cough. Negative for shortness of breath.   Cardiovascular: Negative for chest pain.  Endocrine: Positive for cold intolerance.  Musculoskeletal: Positive for myalgias and arthralgias.  Allergic/Immunologic: Negative for environmental allergies.  All other systems reviewed and are negative.   Past Medical History  Diagnosis Date  . Hypertension   . Pneumothorax   . Hx of adenomatous colonic polyps   . Stroke [facility]) 2005    left sided weakness  . Arthritis   . Trigger finger     Past Surgical History  Procedure Laterality Date  . Colonoscopy  May 2010    Dr. fields: 5 cm terminal ileum normal, 6 polyps removed, moderate internal hemorrhoids, simple adenomas, next colonoscopy May 2015  . Craniotomy  2000    pressure  . Inguinal hernia repair Right   . Repair of trigger  finger Left     pinky  . Olecranon bursectomy Right 02/11/2015    Procedure: EXCISION RIGHT OLECRANON BURSA;  Surgeon: Sanjuana Kava, MD;  Location: [facility];  Service: Orthopedics;  Laterality: Right;  . Colonoscopy N/A 08/01/2015    Procedure: COLONOSCOPY;  Surgeon: Danie Binder, MD;  Location: [facility];  Service: Endoscopy;  Laterality: N/A;  0830    Family History  Problem Relation Age of Onset  . Esophageal cancer Father   . Colon cancer Neg Hx     Social History Social History  Substance Use Topics  . Smoking status: Current Every Day Smoker -- 0.75 packs/day for 50 years    Types: Cigarettes  . Smokeless tobacco: None  . Alcohol Use: 3.0 oz/week    0 Standard drinks or equivalent, 5 Cans of beer per week     Comment: 4-6 per day (12 ounces)    Allergies  Allergen Reactions  . Aleve [Naproxen] Other (See Comments)    Numbness in extremities   . Celecoxib Other (See Comments)    Nose bleed.  . Fluoxetine Hcl Swelling    Current Outpatient Prescriptions  Medication Sig Dispense Refill  . ALPRAZolam (XANAX) 0.5 MG tablet Take 0.5 mg by mouth at bedtime as needed for sleep.     Marland Kitchen amLODipine (NORVASC) 5 MG tablet Take 5 mg by mouth daily.    Marland Kitchen aspirin EC 81 MG tablet Take 81 mg by mouth  daily.    . Chlorpheniramine Maleate (ALLERGY PO) Take 1 tablet by mouth daily as needed (allergies).    Marland Kitchen HYDROcodone-acetaminophen (NORCO) 7.5-325 MG tablet Take 1 tablet by mouth every 4 (four) hours as needed for moderate pain (Must last 30 days.  Do not drive or operate machinery while taking this medicine.). 180 tablet 0   No current facility-administered medications for this visit.     Physical Exam  Blood pressure 152/102, pulse 83, temperature 97.9 F (36.6 C), resp. rate 16, height 6\' 7"  (2.007 m), weight 215 lb 12.8 oz (97.886 kg).  Constitutional: overall normal hygiene, normal nutrition, well developed, normal grooming, normal body habitus. Assistive  device:none  Musculoskeletal: gait and station Limp none, muscle tone and strength are normal, no tremors or atrophy is present.  .  Neurological: coordination overall normal.  Deep tendon reflex/nerve stretch intact.  Sensation normal.  Cranial nerves II-XII intact.   Skin:   normal overall no scars, lesions, ulcers or rashes. No psoriasis.  Psychiatric: Alert and oriented x 3.  Recent memory intact, remote memory unclear.  Normal mood and affect. Well groomed.  Good eye contact.  Cardiovascular: overall no swelling, no varicosities, no edema bilaterally, normal temperatures of the legs and arms, no clubbing, cyanosis and good capillary refill.  Lymphatic: palpation is normal.  Examination of left Upper Extremity is done.  Inspection:   Overall:  Elbow non-tender without crepitus or defects, forearm non-tender without crepitus or defects, wrist non-tender without crepitus or defects, hand non-tender.    Shoulder: with glenohumeral joint tenderness, without effusion.   Upper arm: without swelling and tenderness   Range of motion:   Overall:  Full range of motion of the elbow, full range of motion of wrist and full range of motion in fingers.   Shoulder:  left  160 degrees forward flexion; 150 degrees abduction; 35 degrees internal rotation, 35 degrees external rotation, 25 degrees extension, 40 degrees adduction.   Stability:   Overall:  Shoulder, elbow and wrist stable   Strength and Tone:   Overall full shoulder muscles strength, full upper arm strength and normal upper arm bulk and tone.  Extremities:The right shoulder has no pain Inspection right shoulder normal Strength and tone normal both shoulders Range of motion full of the right shoulder  Additional services performed: I talked to him about his smoking again. He is not ready to quit but he says he will consider it.  His hypertension is well controlled and he has no distal edema.  He watches his salt intake.  The  patient has been educated about the nature of the problem(s) and counseled on treatment options.  The patient appeared to understand what I have discussed and is in agreement with it.  PLAN Call if any problems.  Precautions discussed.  Continue current medications.   Return to clinic 3 months

## 2015-11-19 NOTE — Patient Instructions (Signed)
Smoking Cessation, Tips for Success If you are ready to quit smoking, congratulations! You have chosen to help yourself be healthier. Cigarettes bring nicotine, tar, carbon monoxide, and other irritants into your body. Your lungs, heart, and blood vessels will be able to work better without these poisons. There are many different ways to quit smoking. Nicotine gum, nicotine patches, a nicotine inhaler, or nicotine nasal spray can help with physical craving. Hypnosis, support groups, and medicines help break the habit of smoking. WHAT THINGS CAN I DO TO MAKE QUITTING EASIER?  Here are some tips to help you quit for good:  Pick a date when you will quit smoking completely. Tell all of your friends and family about your plan to quit on that date.  Do not try to slowly cut down on the number of cigarettes you are smoking. Pick a quit date and quit smoking completely starting on that day.  Throw away all cigarettes.   Clean and remove all ashtrays from your home, work, and car.  On a card, write down your reasons for quitting. Carry the card with you and read it when you get the urge to smoke.  Cleanse your body of nicotine. Drink enough water and fluids to keep your urine clear or pale yellow. Do this after quitting to flush the nicotine from your body.  Learn to predict your moods. Do not let a bad situation be your excuse to have a cigarette. Some situations in your life might tempt you into wanting a cigarette.  Never have "just one" cigarette. It leads to wanting another and another. Remind yourself of your decision to quit.  Change habits associated with smoking. If you smoked while driving or when feeling stressed, try other activities to replace smoking. Stand up when drinking your coffee. Brush your teeth after eating. Sit in a different chair when you read the paper. Avoid alcohol while trying to quit, and try to drink fewer caffeinated beverages. Alcohol and caffeine may urge you to  smoke.  Avoid foods and drinks that can trigger a desire to smoke, such as sugary or spicy foods and alcohol.  Ask people who smoke not to smoke around you.  Have something planned to do right after eating or having a cup of coffee. For example, plan to take a walk or exercise.  Try a relaxation exercise to calm you down and decrease your stress. Remember, you may be tense and nervous for the first 2 weeks after you quit, but this will pass.  Find new activities to keep your hands busy. Play with a pen, coin, or rubber band. Doodle or draw things on paper.  Brush your teeth right after eating. This will help cut down on the craving for the taste of tobacco after meals. You can also try mouthwash.   Use oral substitutes in place of cigarettes. Try using lemon drops, carrots, cinnamon sticks, or chewing gum. Keep them handy so they are available when you have the urge to smoke.  When you have the urge to smoke, try deep breathing.  Designate your home as a nonsmoking area.  If you are a heavy smoker, ask your health care provider about a prescription for nicotine chewing gum. It can ease your withdrawal from nicotine.  Reward yourself. Set aside the cigarette money you save and buy yourself something nice.  Look for support from others. Join a support group or smoking cessation program. Ask someone at home or at work to help you with your plan   to quit smoking.  Always ask yourself, "Do I need this cigarette or is this just a reflex?" Tell yourself, "Today, I choose not to smoke," or "I do not want to smoke." You are reminding yourself of your decision to quit.  Do not replace cigarette smoking with electronic cigarettes (commonly called e-cigarettes). The safety of e-cigarettes is unknown, and some may contain harmful chemicals.  If you relapse, do not give up! Plan ahead and think about what you will do the next time you get the urge to smoke. HOW WILL I FEEL WHEN I QUIT SMOKING? You  may have symptoms of withdrawal because your body is used to nicotine (the addictive substance in cigarettes). You may crave cigarettes, be irritable, feel very hungry, cough often, get headaches, or have difficulty concentrating. The withdrawal symptoms are only temporary. They are strongest when you first quit but will go away within 10-14 days. When withdrawal symptoms occur, stay in control. Think about your reasons for quitting. Remind yourself that these are signs that your body is healing and getting used to being without cigarettes. Remember that withdrawal symptoms are easier to treat than the major diseases that smoking can cause.  Even after the withdrawal is over, expect periodic urges to smoke. However, these cravings are generally short lived and will go away whether you smoke or not. Do not smoke! WHAT RESOURCES ARE AVAILABLE TO HELP ME QUIT SMOKING? Your health care provider can direct you to community resources or hospitals for support, which may include:  Group support.  Education.  Hypnosis.  Therapy.   This information is not intended to replace advice given to you by your health care provider. Make sure you discuss any questions you have with your health care provider.   Document Released: 06/04/2004 Document Revised: 09/27/2014 Document Reviewed: 02/22/2013 Elsevier Interactive Patient Education 2016 Elsevier Inc.  

## 2015-12-17 ENCOUNTER — Telehealth: Payer: Self-pay | Admitting: Orthopaedic Surgery

## 2015-12-17 MED ORDER — HYDROCODONE-ACETAMINOPHEN 7.5-325 MG PO TABS: 1.0000 | ORAL_TABLET | ORAL | Status: DC | PRN

## 2015-12-17 NOTE — Telephone Encounter (Signed)
Patient requesting refill of Hydrocodone 7.5/325mg   Qty 180 Tablets

## 2015-12-17 NOTE — Telephone Encounter (Signed)
Rx done. 

## 2015-12-23 DIAGNOSIS — L910 Hypertrophic scar: Secondary | ICD-10-CM | POA: Diagnosis not present

## 2016-01-05 ENCOUNTER — Encounter: Payer: Self-pay | Admitting: Gastroenterology

## 2016-01-19 ENCOUNTER — Telehealth: Payer: Self-pay

## 2016-01-19 NOTE — Telephone Encounter (Signed)
ROUTING TO DR KEELING 

## 2016-01-20 MED ORDER — HYDROCODONE-ACETAMINOPHEN 7.5-325 MG PO TABS: 1.0000 | ORAL_TABLET | ORAL | Status: DC | PRN

## 2016-01-20 NOTE — Telephone Encounter (Signed)
Rx Done . 

## 2016-01-30 DIAGNOSIS — R945 Abnormal results of liver function studies: Secondary | ICD-10-CM | POA: Diagnosis not present

## 2016-01-30 DIAGNOSIS — Z0000 Encounter for general adult medical examination without abnormal findings: Secondary | ICD-10-CM | POA: Diagnosis not present

## 2016-01-30 DIAGNOSIS — I10 Essential (primary) hypertension: Secondary | ICD-10-CM | POA: Diagnosis not present

## 2016-01-30 DIAGNOSIS — F17200 Nicotine dependence, unspecified, uncomplicated: Secondary | ICD-10-CM | POA: Diagnosis not present

## 2016-01-30 DIAGNOSIS — M1990 Unspecified osteoarthritis, unspecified site: Secondary | ICD-10-CM | POA: Diagnosis not present

## 2016-01-30 DIAGNOSIS — M549 Dorsalgia, unspecified: Secondary | ICD-10-CM | POA: Diagnosis not present

## 2016-01-30 DIAGNOSIS — K7689 Other specified diseases of liver: Secondary | ICD-10-CM | POA: Diagnosis not present

## 2016-02-19 ENCOUNTER — Encounter: Payer: Self-pay | Admitting: Orthopaedic Surgery

## 2016-02-19 ENCOUNTER — Ambulatory Visit (INDEPENDENT_AMBULATORY_CARE_PROVIDER_SITE_OTHER): Payer: PPO | Admitting: Orthopaedic Surgery

## 2016-02-19 VITALS — BP 122/79 | HR 87 | Temp 97.9°F | Ht 79.0 in | Wt 215.0 lb

## 2016-02-19 DIAGNOSIS — M25512 Pain in left shoulder: Secondary | ICD-10-CM | POA: Diagnosis not present

## 2016-02-19 MED ORDER — HYDROCODONE-ACETAMINOPHEN 7.5-325 MG PO TABS: 1.0000 | ORAL_TABLET | ORAL | Status: DC | PRN

## 2016-02-19 NOTE — Progress Notes (Signed)
[name], male DOB:1946-02-27, 70 y.o. [id number]  Chief Complaint  Patient presents with  . Follow-up    left shoulder    HPI  Larry Reese is a 70 y.o. male who has chronic left shoulder pain. He has no new trauma, no paresthesias.  He is doing his exercises. He is taking his medicine.  He has no redness.  HPI  Body mass index is 24.21 kg/(m^2).  ROS  Review of Systems  HENT: Negative for congestion.   Respiratory: Positive for cough. Negative for shortness of breath.   Cardiovascular: Negative for chest pain.  Endocrine: Positive for cold intolerance.  Musculoskeletal: Positive for myalgias and arthralgias.  Allergic/Immunologic: Negative for environmental allergies.  All other systems reviewed and are negative.   Past Medical History  Diagnosis Date  . Hypertension   . Pneumothorax   . Hx of adenomatous colonic polyps   . Stroke [facility]) 2005    left sided weakness  . Arthritis   . Trigger finger     Past Surgical History  Procedure Laterality Date  . Colonoscopy  May 2010    Dr. fields: 5 cm terminal ileum normal, 6 polyps removed, moderate internal hemorrhoids, simple adenomas, next colonoscopy May 2015  . Craniotomy  2000    pressure  . Inguinal hernia repair Right   . Repair of trigger finger Left     pinky  . Olecranon bursectomy Right 02/11/2015    Procedure: EXCISION RIGHT OLECRANON BURSA;  Surgeon: Sanjuana Kava, MD;  Location: [facility];  Service: Orthopedics;  Laterality: Right;  . Colonoscopy N/A 08/01/2015    Procedure: COLONOSCOPY;  Surgeon: Danie Binder, MD;  Location: [facility];  Service: Endoscopy;  Laterality: N/A;  0830    Family History  Problem Relation Age of Onset  . Esophageal cancer Father   . Colon cancer Neg Hx     Social History Social History  Substance Use Topics  . Smoking status: Current Every Day Smoker -- 0.75 packs/day for 50 years    Types: Cigarettes  . Smokeless tobacco: None  . Alcohol  Use: 3.0 oz/week    0 Standard drinks or equivalent, 5 Cans of beer per week     Comment: 4-6 per day (12 ounces)    Allergies  Allergen Reactions  . Aleve [Naproxen] Other (See Comments)    Numbness in extremities   . Celecoxib Other (See Comments)    Nose bleed.  . Fluoxetine Hcl Swelling    Current Outpatient Prescriptions  Medication Sig Dispense Refill  . ALPRAZolam (XANAX) 0.5 MG tablet Take 0.5 mg by mouth at bedtime as needed for sleep. Reported on 02/19/2016    . amLODipine (NORVASC) 5 MG tablet Take 5 mg by mouth daily.    Marland Kitchen aspirin EC 81 MG tablet Take 81 mg by mouth daily.    . Chlorpheniramine Maleate (ALLERGY PO) Take 1 tablet by mouth daily as needed (allergies). Reported on 02/19/2016    . HYDROcodone-acetaminophen (NORCO) 7.5-325 MG tablet Take 1 tablet by mouth every 4 (four) hours as needed for moderate pain (Must last 30 days.  Do not drive or operate machinery while taking this medicine.). 150 tablet 0   No current facility-administered medications for this visit.     Physical Exam  Blood pressure 122/79, pulse 87, temperature 97.9 F (36.6 C), height 6\' 7"  (2.007 m), weight 215 lb (97.523 kg).  Constitutional: overall normal hygiene, normal nutrition, well developed, normal grooming, normal body habitus. Assistive  device:none  Musculoskeletal: gait and station Limp none, muscle tone and strength are normal, no tremors or atrophy is present.  .  Neurological: coordination overall normal.  Deep tendon reflex/nerve stretch intact.  Sensation normal.  Cranial nerves II-XII intact.   Skin:   normal overall no scars, lesions, ulcers or rashes. No psoriasis.  Psychiatric: Alert and oriented x 3.  Recent memory intact, remote memory unclear.  Normal mood and affect. Well groomed.  Good eye contact.  Cardiovascular: overall no swelling, no varicosities, no edema bilaterally, normal temperatures of the legs and arms, no clubbing, cyanosis and good capillary  refill.  Lymphatic: palpation is normal.  Examination of left Upper Extremity is done.  Inspection:   Overall:  Elbow non-tender without crepitus or defects, forearm non-tender without crepitus or defects, wrist non-tender without crepitus or defects, hand non-tender.    Shoulder: with glenohumeral joint tenderness, without effusion.   Upper arm: without swelling and tenderness   Range of motion:   Overall:  Full range of motion of the elbow, full range of motion of wrist and full range of motion in fingers.   Shoulder:  left  160 145 degrees forward flexion; 35 degrees abduction; 35 degrees internal rotation, 35 degrees external rotation, 15 degrees extension, 40 degrees adduction.   Stability:   Overall:  Shoulder, elbow and wrist stable   Strength and Tone:   Overall full shoulder muscles strength, full upper arm strength and normal upper arm bulk and tone.   The patient has been educated about the nature of the problem(s) and counseled on treatment options.  The patient appeared to understand what I have discussed and is in agreement with it.  Encounter Diagnosis  Name Primary?  . Shoulder pain, left Yes    PLAN Call if any problems.  Precautions discussed.  Continue current medications.   Return to clinic 3 months   Electronically Signed Sanjuana Kava, MD 6/1/20179:51 AM

## 2016-03-03 ENCOUNTER — Ambulatory Visit: Payer: PPO | Admitting: Orthopaedic Surgery

## 2016-03-18 ENCOUNTER — Telehealth: Payer: Self-pay | Admitting: Orthopaedic Surgery

## 2016-03-18 MED ORDER — HYDROCODONE-ACETAMINOPHEN 7.5-325 MG PO TABS: 1.0000 | ORAL_TABLET | ORAL | Status: DC | PRN

## 2016-03-18 NOTE — Telephone Encounter (Signed)
Rx done. 

## 2016-03-18 NOTE — Telephone Encounter (Signed)
Hydrocodone-Acetaminophen 7.5/325 mg  Qty 150 Tablets °

## 2016-04-19 ENCOUNTER — Telehealth: Payer: Self-pay | Admitting: Orthopaedic Surgery

## 2016-04-19 MED ORDER — HYDROCODONE-ACETAMINOPHEN 7.5-325 MG PO TABS: 1.0000 | ORAL_TABLET | ORAL | 0 refills | Status: DC | PRN

## 2016-04-19 NOTE — Telephone Encounter (Signed)
Patient called for refill [id number] (NORCO) 7.5-325 MG tablet [id number] - quantity 120

## 2016-05-07 DIAGNOSIS — F17200 Nicotine dependence, unspecified, uncomplicated: Secondary | ICD-10-CM | POA: Diagnosis not present

## 2016-05-07 DIAGNOSIS — N529 Male erectile dysfunction, unspecified: Secondary | ICD-10-CM | POA: Diagnosis not present

## 2016-05-07 DIAGNOSIS — I10 Essential (primary) hypertension: Secondary | ICD-10-CM | POA: Diagnosis not present

## 2016-05-07 DIAGNOSIS — M549 Dorsalgia, unspecified: Secondary | ICD-10-CM | POA: Diagnosis not present

## 2016-05-19 ENCOUNTER — Telehealth: Payer: Self-pay | Admitting: Orthopaedic Surgery

## 2016-05-19 MED ORDER — HYDROCODONE-ACETAMINOPHEN 7.5-325 MG PO TABS: 1.0000 | ORAL_TABLET | ORAL | 0 refills | Status: DC | PRN

## 2016-05-19 NOTE — Telephone Encounter (Signed)
Patient called and requested a refill on Hydrocodone-Acetaminophen (Norco)  7.5-325 mgs. Qty 120 Sig: Take 1 tablet by mouth every 4 (four) hours as needed for moderate pain (Must last 30 days.  Do not drive or operate machinery while taking this medicine.). °

## 2016-05-25 ENCOUNTER — Encounter: Payer: Self-pay | Admitting: Orthopaedic Surgery

## 2016-05-25 ENCOUNTER — Ambulatory Visit (INDEPENDENT_AMBULATORY_CARE_PROVIDER_SITE_OTHER): Payer: PPO | Admitting: Orthopaedic Surgery

## 2016-05-25 VITALS — BP 135/84 | HR 79 | Temp 97.9°F | Ht 79.0 in | Wt 211.0 lb

## 2016-05-25 DIAGNOSIS — Z720 Tobacco use: Secondary | ICD-10-CM | POA: Diagnosis not present

## 2016-05-25 DIAGNOSIS — M25512 Pain in left shoulder: Secondary | ICD-10-CM

## 2016-05-25 DIAGNOSIS — F17200 Nicotine dependence, unspecified, uncomplicated: Secondary | ICD-10-CM

## 2016-05-25 NOTE — Progress Notes (Signed)
Patient [id number] Larry Reese, male DOB:[birth year], 70 y.o. [id number]  Chief Complaint  Patient presents with  . Follow-up    left shoulder pain    HPI  Larry Reese is a 70 y.o. male who has chronic left shoulder pain.  He is doing his exercises.  He has no paresthesias or swelling or redness.  He has less pain in the summer months. HPI  Body mass index is 23.77 kg/m.  ROS  Review of Systems  HENT: Negative for congestion.   Respiratory: Positive for cough. Negative for shortness of breath.   Cardiovascular: Negative for chest pain.  Endocrine: Positive for cold intolerance.  Musculoskeletal: Positive for arthralgias and myalgias.  Allergic/Immunologic: Negative for environmental allergies.  All other systems reviewed and are negative.   Past Medical History:  Diagnosis Date  . Arthritis   . Hx of adenomatous colonic polyps   . Hypertension   . Pneumothorax   . Stroke [facility]) 2005   left sided weakness  . Trigger finger     Past Surgical History:  Procedure Laterality Date  . COLONOSCOPY  May 2010   Dr. fields: 5 cm terminal ileum normal, 6 polyps removed, moderate internal hemorrhoids, simple adenomas, next colonoscopy May 2015  . COLONOSCOPY N/A 08/01/2015   Procedure: COLONOSCOPY;  Surgeon: Danie Binder, MD;  Location: [facility];  Service: Endoscopy;  Laterality: N/A;  0830  . CRANIOTOMY  2000   pressure  . INGUINAL HERNIA REPAIR Right   . OLECRANON BURSECTOMY Right 02/11/2015   Procedure: EXCISION RIGHT OLECRANON BURSA;  Surgeon: Sanjuana Kava, MD;  Location: [facility];  Service: Orthopedics;  Laterality: Right;  . repair of trigger finger Left    pinky    Family History  Problem Relation Age of Onset  . Esophageal cancer Father   . Colon cancer Neg Hx     Social History Social History  Substance Use Topics  . Smoking status: Current Every Day Smoker    Packs/day: 0.75    Years: 50.00    Types: Cigarettes  . Smokeless tobacco: Never Used   . Alcohol use 3.0 oz/week    5 Cans of beer per week     Comment: 4-6 per day (12 ounces)    Allergies  Allergen Reactions  . Aleve [Naproxen] Other (See Comments)    Numbness in extremities   . Celecoxib Other (See Comments)    Nose bleed.  . Fluoxetine Hcl Swelling    Current Outpatient Prescriptions  Medication Sig Dispense Refill  . ALPRAZolam (XANAX) 0.5 MG tablet Take 0.5 mg by mouth at bedtime as needed for sleep. Reported on 02/19/2016    . amLODipine (NORVASC) 5 MG tablet Take 5 mg by mouth daily.    Marland Kitchen aspirin EC 81 MG tablet Take 81 mg by mouth daily.    . Chlorpheniramine Maleate (ALLERGY PO) Take 1 tablet by mouth daily as needed (allergies). Reported on 02/19/2016    . HYDROcodone-acetaminophen (NORCO) 7.5-325 MG tablet Take 1 tablet by mouth every 4 (four) hours as needed for moderate pain (Must last 30 days.  Do not drive or operate machinery while taking this medicine.). 110 tablet 0   No current facility-administered medications for this visit.      Physical Exam  Blood pressure 135/84, pulse 79, temperature 97.9 F (36.6 C), height 6\' 7"  (2.007 m), weight 211 lb (95.7 kg).  Constitutional: overall normal hygiene, normal nutrition, well developed, normal grooming, normal body habitus. Assistive device:none  Musculoskeletal: gait  and station Limp none, muscle tone and strength are normal, no tremors or atrophy is present.  .  Neurological: coordination overall normal.  Deep tendon reflex/nerve stretch intact.  Sensation normal.  Cranial nerves II-XII intact.   Skin:   normal overall no scars, lesions, ulcers or rashes. No psoriasis.  Psychiatric: Alert and oriented x 3.  Recent memory intact, remote memory unclear.  Normal mood and affect. Well groomed.  Good eye contact.  Cardiovascular: overall no swelling, no varicosities, no edema bilaterally, normal temperatures of the legs and arms, no clubbing, cyanosis and good capillary refill.  Lymphatic: palpation  is normal.  Examination of left Upper Extremity is done.  Inspection:   Overall:  Elbow non-tender without crepitus or defects, forearm non-tender without crepitus or defects, wrist non-tender without crepitus or defects, hand non-tender.    Shoulder: with glenohumeral joint tenderness, without effusion.   Upper arm: without swelling and tenderness   Range of motion:   Overall:  Full range of motion of the elbow, full range of motion of wrist and full range of motion in fingers.   Shoulder:  left  165 degrees forward flexion; 145 degrees abduction; 35 degrees internal rotation, 35 degrees external rotation, 20 degrees extension, 40 degrees adduction.   Stability:   Overall:  Shoulder, elbow and wrist stable   Strength and Tone:   Overall full shoulder muscles strength, full upper arm strength and normal upper arm bulk and tone.  The patient has been educated about the nature of the problem(s) and counseled on treatment options.  The patient appeared to understand what I have discussed and is in agreement with it.  Encounter Diagnoses  Name Primary?  . Shoulder pain, left Yes  . Tobacco smoker within last 12 months    I have talked to him about cutting back or stopping smoking.  He will consider this.  PLAN Call if any problems.  Precautions discussed.  Continue current medications.   Return to clinic 3 months  Electronically Signed Sanjuana Kava, MD 9/5/20179:35 AM

## 2016-05-25 NOTE — Patient Instructions (Signed)
You Can Quit Smoking If you are ready to quit smoking or are thinking about it, congratulations! You have chosen to help yourself be healthier and live longer! There are lots of different ways to quit smoking. Nicotine gum, nicotine patches, a nicotine inhaler, or nicotine nasal spray can help with physical craving. Hypnosis, support groups, and medicines help break the habit of smoking. TIPS TO GET OFF AND STAY OFF CIGARETTES  Learn to predict your moods. Do not let a bad situation be your excuse to have a cigarette. Some situations in your life might tempt you to have a cigarette.  Ask friends and co-workers not to smoke around you.  Make your home smoke-free.  Never have "just one" cigarette. It leads to wanting another and another. Remind yourself of your decision to quit.  On a card, make a list of your reasons for not smoking. Read it at least the same number of times a day as you have a cigarette. Tell yourself everyday, "I do not want to smoke. I choose not to smoke."  Ask someone at home or work to help you with your plan to quit smoking.  Have something planned after you eat or have a cup of coffee. Take a walk or get other exercise to perk you up. This will help to keep you from overeating.  Try a relaxation exercise to calm you down and decrease your stress. Remember, you may be tense and nervous the first two weeks after you quit. This will pass.  Find new activities to keep your hands busy. Play with a pen, coin, or rubber band. Doodle or draw things on paper.  Brush your teeth right after eating. This will help cut down the craving for the taste of tobacco after meals. You can try mouthwash too.  Try gum, breath mints, or diet candy to keep something in your mouth. IF YOU SMOKE AND WANT TO QUIT:  Do not stock up on cigarettes. Never buy a carton. Wait until one pack is finished before you buy another.  Never carry cigarettes with you at work or at home.  Keep cigarettes  as far away from you as possible. Leave them with someone else.  Never carry matches or a lighter with you.  Ask yourself, "Do I need this cigarette or is this just a reflex?"  Bet with someone that you can quit. Put cigarette money in a piggy bank every morning. If you smoke, you give up the money. If you do not smoke, by the end of the week, you keep the money.  Keep trying. It takes 21 days to change a habit!  Talk to your doctor about using medicines to help you quit. These include nicotine replacement gum, lozenges, or skin patches.   This information is not intended to replace advice given to you by your health care provider. Make sure you discuss any questions you have with your health care provider.   Document Released: 07/03/2009 Document Revised: 11/29/2011 Document Reviewed: 07/03/2009 Elsevier Interactive Patient Education 2016 Elsevier Inc.  Shoulder Range of Motion Exercises Shoulder range of motion (ROM) exercises are designed to keep the shoulder moving freely. They are often recommended for people who have shoulder pain. MOVEMENT EXERCISE When you are able, do this exercise 5-6 days per week, or as told by your health care provider. Work toward doing 2 sets of 10 swings. Pendulum Exercise How To Do This Exercise Lying Down 1. Lie face-down on a bed with your abdomen close to  the side of the bed. 2. Let your arm hang over the side of the bed. 3. Relax your shoulder, arm, and hand. 4. Slowly and gently swing your arm forward and back. Do not use your neck muscles to swing your arm. They should be relaxed. If you are struggling to swing your arm, have someone gently swing it for you. When you do this exercise for the first time, swing your arm at a 15 degree angle for 15 seconds, or swing your arm 10 times. As pain lessens over time, increase the angle of the swing to 30-45 degrees. 5. Repeat steps 1-4 with the other arm. How To Do This Exercise While Standing 1. Stand  next to a sturdy chair or table and hold on to it with your hand.  Bend forward at the waist.  Bend your knees slightly.  Relax your other arm and let it hang limp.  Relax the shoulder blade of the arm that is hanging and let it drop.  While keeping your shoulder relaxed, use body motion to swing your arm in small circles. The first time you do this exercise, swing your arm for about 30 seconds or 10 times. When you do it next time, swing your arm for a little longer.  Stand up tall and relax.  Repeat steps 1-7, this time changing the direction of the circles. 2. Repeat steps 1-8 with the other arm. STRETCHING EXERCISES Do these exercises 3-4 times per day on 5-6 days per week or as told by your health care provider. Work toward holding the stretch for 20 seconds. Stretching Exercise 1 1. Lift your arm straight out in front of you. 2. Bend your arm 90 degrees at the elbow (right angle) so your forearm goes across your body and looks like the letter "L." 3. Use your other arm to gently pull the elbow forward and across your body. 4. Repeat steps 1-3 with the other arm. Stretching Exercise 2 You will need a towel or rope for this exercise. 1. Bend one arm behind your back with the palm facing outward. 2. Hold a towel with your other hand. 3. Reach the arm that holds the towel above your head, and bend that arm at the elbow. Your wrist should be behind your neck. 4. Use your free hand to grab the free end of the towel. 5. With the higher hand, gently pull the towel up behind you. 6. With the lower hand, pull the towel down behind you. 7. Repeat steps 1-6 with the other arm. STRENGTHENING EXERCISES Do each of these exercises at four different times of day (sessions) every day or as told by your health care provider. To begin with, repeat each exercise 5 times (repetitions). Work toward doing 3 sets of 12 repetitions or as told by your health care provider. Strengthening Exercise 1 You  will need a light weight for this activity. As you grow stronger, you may use a heavier weight. 1. Standing with a weight in your hand, lift your arm straight out to the side until it is at the same height as your shoulder. 2. Bend your arm at 90 degrees so that your fingers are pointing to the ceiling. 3. Slowly raise your hand until your arm is straight up in the air. 4. Repeat steps 1-3 with the other arm. Strengthening Exercise 2 You will need a light weight for this activity. As you grow stronger, you may use a heavier weight. 1. Standing with a weight in your  hand, gradually move your straight arm in an arc, starting at your side, then out in front of you, then straight up over your head. 2. Gradually move your other arm in an arc, starting at your side, then out in front of you, then straight up over your head. 3. Repeat steps 1-2 with the other arm. Strengthening Exercise 3 You will need an elastic band for this activity. As you grow stronger, gradually increase the size of the bands or increase the number of bands that you use at one time. 1. While standing, hold an elastic band in one hand and raise that arm up in the air. 2. With your other hand, pull down the band until that hand is by your side. 3. Repeat steps 1-2 with the other arm.   This information is not intended to replace advice given to you by your health care provider. Make sure you discuss any questions you have with your health care provider.   Document Released: 06/05/2003 Document Revised: 01/21/2015 Document Reviewed: 09/02/2014 Elsevier Interactive Patient Education [employer].

## 2016-06-21 ENCOUNTER — Telehealth: Payer: Self-pay | Admitting: Orthopedic Surgery

## 2016-06-21 ENCOUNTER — Other Ambulatory Visit: Payer: Self-pay | Admitting: *Deleted

## 2016-06-21 MED ORDER — HYDROCODONE-ACETAMINOPHEN 7.5-325 MG PO TABS: 1.0000 | ORAL_TABLET | ORAL | 0 refills | Status: DC | PRN

## 2016-06-21 NOTE — Telephone Encounter (Signed)
Hydrocodone-Acetaminophen  7.5/325mg   Qty 110 Tablets   Take 1 tablet by  Mouth every 4 (four) hours as needed for moderate pain   (Must last 30 days. Do not drive or operate machinery while taking this medicine.)

## 2016-07-20 ENCOUNTER — Telehealth: Payer: Self-pay | Admitting: Orthopaedic Surgery

## 2016-07-20 MED ORDER — HYDROCODONE-ACETAMINOPHEN 7.5-325 MG PO TABS: 1.0000 | ORAL_TABLET | ORAL | 0 refills | Status: DC | PRN

## 2016-07-20 NOTE — Telephone Encounter (Signed)
Patient called and requested a  Refill on Hydrocodone/Acetaminophen 7.5-325 mgs.   Qty  110   Sig: Take 1 tablet by mouth every 4 (four) hours as needed for moderate pain (Must last 30 days. Do not drive or operate machinery while taking this medicine.).

## 2016-08-10 DIAGNOSIS — L600 Ingrowing nail: Secondary | ICD-10-CM | POA: Diagnosis not present

## 2016-08-10 DIAGNOSIS — I10 Essential (primary) hypertension: Secondary | ICD-10-CM | POA: Diagnosis not present

## 2016-08-10 DIAGNOSIS — Z23 Encounter for immunization: Secondary | ICD-10-CM | POA: Diagnosis not present

## 2016-08-10 DIAGNOSIS — N401 Enlarged prostate with lower urinary tract symptoms: Secondary | ICD-10-CM | POA: Diagnosis not present

## 2016-08-19 ENCOUNTER — Telehealth: Payer: Self-pay | Admitting: Orthopaedic Surgery

## 2016-08-19 MED ORDER — HYDROCODONE-ACETAMINOPHEN 7.5-325 MG PO TABS: 1.0000 | ORAL_TABLET | ORAL | 0 refills | Status: DC | PRN

## 2016-08-19 NOTE — Telephone Encounter (Signed)
Hydrocodone-Acetaminophen  7.5/325mg   Qty 100 Tablets

## 2016-08-24 ENCOUNTER — Encounter: Payer: Self-pay | Admitting: Orthopaedic Surgery

## 2016-08-24 ENCOUNTER — Ambulatory Visit (INDEPENDENT_AMBULATORY_CARE_PROVIDER_SITE_OTHER): Payer: PPO | Admitting: Orthopaedic Surgery

## 2016-08-24 VITALS — BP 118/67 | HR 91 | Temp 97.7°F | Ht 79.0 in | Wt 211.0 lb

## 2016-08-24 DIAGNOSIS — M25512 Pain in left shoulder: Secondary | ICD-10-CM | POA: Diagnosis not present

## 2016-08-24 DIAGNOSIS — G8929 Other chronic pain: Secondary | ICD-10-CM

## 2016-08-24 DIAGNOSIS — F17210 Nicotine dependence, cigarettes, uncomplicated: Secondary | ICD-10-CM

## 2016-08-24 NOTE — Progress Notes (Signed)
Patient MD:[id number] [name], male DOB:June 16, 1946, 70 y.o. [id number]  Chief Complaint  Patient presents with  . Shoulder Pain    chronic left    HPI  Larry Reese is a 70 y.o. male who has chronic pain of the left shoulder.  He has no new trauma, no paresthesias.  He has been doing his exercises and taking his medicine. HPI  Body mass index is 23.77 kg/m.  ROS  Review of Systems  HENT: Negative for congestion.   Respiratory: Positive for cough. Negative for shortness of breath.   Cardiovascular: Negative for chest pain.  Endocrine: Positive for cold intolerance.  Musculoskeletal: Positive for arthralgias and myalgias.  Allergic/Immunologic: Negative for environmental allergies.  All other systems reviewed and are negative.   Past Medical History:  Diagnosis Date  . Arthritis   . Hx of adenomatous colonic polyps   . Hypertension   . Pneumothorax   . Stroke [facility]) 2005   left sided weakness  . Trigger finger     Past Surgical History:  Procedure Laterality Date  . COLONOSCOPY  May 2010   Dr. fields: 5 cm terminal ileum normal, 6 polyps removed, moderate internal hemorrhoids, simple adenomas, next colonoscopy May 2015  . COLONOSCOPY N/A 08/01/2015   Procedure: COLONOSCOPY;  Surgeon: Danie Binder, MD;  Location: [facility];  Service: Endoscopy;  Laterality: N/A;  0830  . CRANIOTOMY  2000   pressure  . INGUINAL HERNIA REPAIR Right   . OLECRANON BURSECTOMY Right 02/11/2015   Procedure: EXCISION RIGHT OLECRANON BURSA;  Surgeon: Sanjuana Kava, MD;  Location: [facility];  Service: Orthopedics;  Laterality: Right;  . repair of trigger finger Left    pinky    Family History  Problem Relation Age of Onset  . Esophageal cancer Father   . Colon cancer Neg Hx     Social History Social History  Substance Use Topics  . Smoking status: Current Every Day Smoker    Packs/day: 0.75    Years: 50.00    Types: Cigarettes  . Smokeless tobacco: Never Used  . Alcohol  use 3.0 oz/week    5 Cans of beer per week     Comment: 4-6 per day (12 ounces)    Allergies  Allergen Reactions  . Aleve [Naproxen] Other (See Comments)    Numbness in extremities   . Celecoxib Other (See Comments)    Nose bleed.  . Fluoxetine Hcl Swelling    Current Outpatient Prescriptions  Medication Sig Dispense Refill  . ALPRAZolam (XANAX) 0.5 MG tablet Take 0.5 mg by mouth at bedtime as needed for sleep. Reported on 02/19/2016    . amLODipine (NORVASC) 5 MG tablet Take 5 mg by mouth daily.    Marland Kitchen aspirin EC 81 MG tablet Take 81 mg by mouth daily.    . Chlorpheniramine Maleate (ALLERGY PO) Take 1 tablet by mouth daily as needed (allergies). Reported on 02/19/2016    . HYDROcodone-acetaminophen (NORCO) 7.5-325 MG tablet Take 1 tablet by mouth every 4 (four) hours as needed for moderate pain (Must last 30 days.  Do not drive or operate machinery while taking this medicine.). 90 tablet 0   No current facility-administered medications for this visit.      Physical Exam  Blood pressure 118/67, pulse 91, temperature 97.7 F (36.5 C), height 6\' 7"  (2.007 m), weight 211 lb (95.7 kg).  Constitutional: overall normal hygiene, normal nutrition, well developed, normal grooming, normal body habitus. Assistive device:none  Musculoskeletal: gait and station Limp  none, muscle tone and strength are normal, no tremors or atrophy is present.  .  Neurological: coordination overall normal.  Deep tendon reflex/nerve stretch intact.  Sensation normal.  Cranial nerves II-XII intact.   Skin:   Normal overall no scars, lesions, ulcers or rashes. No psoriasis.  Psychiatric: Alert and oriented x 3.  Recent memory intact, remote memory unclear.  Normal mood and affect. Well groomed.  Good eye contact.  Cardiovascular: overall no swelling, no varicosities, no edema bilaterally, normal temperatures of the legs and arms, no clubbing, cyanosis and good capillary refill.  Lymphatic: palpation is  normal.  He continues to smoke but has cut back significantly.  I have encouraged him to stop.  Examination of left Upper Extremity is done.  Inspection:   Overall:  Elbow non-tender without crepitus or defects, forearm non-tender without crepitus or defects, wrist non-tender without crepitus or defects, hand non-tender.    Shoulder: with glenohumeral joint tenderness, without effusion.   Upper arm: without swelling and tenderness   Range of motion:   Overall:  Full range of motion of the elbow, full range of motion of wrist and full range of motion in fingers.   Shoulder:  left  165 degrees forward flexion; 145 degrees abduction; 35 degrees internal rotation, 35 degrees external rotation, 20 degrees extension, 40 degrees adduction.   Stability:   Overall:  Shoulder, elbow and wrist stable   Strength and Tone:   Overall full shoulder muscles strength, full upper arm strength and normal upper arm bulk and tone.   The patient has been educated about the nature of the problem(s) and counseled on treatment options.  The patient appeared to understand what I have discussed and is in agreement with it.  Encounter Diagnoses  Name Primary?  . Chronic left shoulder pain Yes  . Cigarette nicotine dependence without complication     PLAN Call if any problems.  Precautions discussed.  Continue current medications.   Return to clinic 3 months   Electronically Signed Sanjuana Kava, MD 12/5/20179:57 AM

## 2016-08-24 NOTE — Patient Instructions (Signed)
Steps to Quit Smoking Smoking tobacco can be bad for your health. It can also affect almost every organ in your body. Smoking puts you and people around you at risk for many serious long-lasting (chronic) diseases. Quitting smoking is hard, but it is one of the best things that you can do for your health. It is never too late to quit. What are the benefits of quitting smoking? When you quit smoking, you lower your risk for getting serious diseases and conditions. They can include:  Lung cancer or lung disease.  Heart disease.  Stroke.  Heart attack.  Not being able to have children (infertility).  Weak bones (osteoporosis) and broken bones (fractures). If you have coughing, wheezing, and shortness of breath, those symptoms may get better when you quit. You may also get sick less often. If you are pregnant, quitting smoking can help to lower your chances of having a baby of low birth weight. What can I do to help me quit smoking? Talk with your doctor about what can help you quit smoking. Some things you can do (strategies) include:  Quitting smoking totally, instead of slowly cutting back how much you smoke over a period of time.  Going to in-person counseling. You are more likely to quit if you go to many counseling sessions.  Using resources and support systems, such as:  Online chats with a counselor.  Phone quitlines.  Printed self-help materials.  Support groups or group counseling.  Text messaging programs.  Mobile phone apps or applications.  Taking medicines. Some of these medicines may have nicotine in them. If you are pregnant or breastfeeding, do not take any medicines to quit smoking unless your doctor says it is okay. Talk with your doctor about counseling or other things that can help you. Talk with your doctor about using more than one strategy at the same time, such as taking medicines while you are also going to in-person counseling. This can help make quitting  easier. What things can I do to make it easier to quit? Quitting smoking might feel very hard at first, but there is a lot that you can do to make it easier. Take these steps:  Talk to your family and friends. Ask them to support and encourage you.  Call phone quitlines, reach out to support groups, or work with a counselor.  Ask people who smoke to not smoke around you.  Avoid places that make you want (trigger) to smoke, such as:  Bars.  Parties.  Smoke-break areas at work.  Spend time with people who do not smoke.  Lower the stress in your life. Stress can make you want to smoke. Try these things to help your stress:  Getting regular exercise.  Deep-breathing exercises.  Yoga.  Meditating.  Doing a body scan. To do this, close your eyes, focus on one area of your body at a time from head to toe, and notice which parts of your body are tense. Try to relax the muscles in those areas.  Download or buy apps on your mobile phone or tablet that can help you stick to your quit plan. There are many free apps, such as QuitGuide from the CDC (Centers for Disease Control and Prevention). You can find more support from smokefree.gov and other websites. This information is not intended to replace advice given to you by your health care provider. Make sure you discuss any questions you have with your health care provider. Document Released: 07/03/2009 Document Revised: 05/04/2016 Document   Reviewed: 01/21/2015 Elsevier Interactive Patient Education  2017 Elsevier Inc.  

## 2016-09-17 ENCOUNTER — Telehealth: Payer: Self-pay | Admitting: *Deleted

## 2016-09-17 NOTE — Telephone Encounter (Signed)
REQUESTS REFILL ON HYDROCODONE 7.5 Sig: Take 1 tablet by mouth every 4 (four) hours as needed for moderate pain (Must last 30 days. Do not drive or operate machinery while taking this medicine.).

## 2016-09-21 MED ORDER — HYDROCODONE-ACETAMINOPHEN 7.5-325 MG PO TABS: 1.0000 | ORAL_TABLET | ORAL | 0 refills | Status: DC | PRN

## 2016-10-04 DIAGNOSIS — M7740 Metatarsalgia, unspecified foot: Secondary | ICD-10-CM | POA: Diagnosis not present

## 2016-10-04 DIAGNOSIS — I739 Peripheral vascular disease, unspecified: Secondary | ICD-10-CM | POA: Diagnosis not present

## 2016-10-04 DIAGNOSIS — L851 Acquired keratosis [keratoderma] palmaris et plantaris: Secondary | ICD-10-CM | POA: Diagnosis not present

## 2016-10-04 DIAGNOSIS — M79673 Pain in unspecified foot: Secondary | ICD-10-CM | POA: Diagnosis not present

## 2016-10-20 ENCOUNTER — Telehealth: Payer: Self-pay | Admitting: Orthopaedic Surgery

## 2016-10-20 MED ORDER — NORCO 7.5-325 MG PO TABS: 1.0000 | ORAL_TABLET | ORAL | 0 refills | Status: DC | PRN

## 2016-10-20 NOTE — Telephone Encounter (Signed)
Hydrocodone-Acetaminophen  7.5/325mg  Qty 90 Tablets °

## 2016-10-26 DIAGNOSIS — N529 Male erectile dysfunction, unspecified: Secondary | ICD-10-CM | POA: Diagnosis not present

## 2016-10-26 DIAGNOSIS — I10 Essential (primary) hypertension: Secondary | ICD-10-CM | POA: Diagnosis not present

## 2016-11-19 ENCOUNTER — Telehealth: Payer: Self-pay | Admitting: Orthopaedic Surgery

## 2016-11-19 NOTE — Telephone Encounter (Signed)
Patient called for refill today, Fri, 11/19/16, for refill: NORCO 7.5-325 MG tablet 85 tablet  - made aware that Dr is out of office until date of his scheduled appointment (11/30/16)

## 2016-11-23 ENCOUNTER — Ambulatory Visit: Payer: PPO | Admitting: Orthopaedic Surgery

## 2016-11-29 MED ORDER — NORCO 7.5-325 MG PO TABS: 1.0000 | ORAL_TABLET | Freq: Four times a day (QID) | ORAL | 0 refills | Status: DC | PRN

## 2016-11-30 ENCOUNTER — Ambulatory Visit: Payer: PPO | Admitting: Orthopaedic Surgery

## 2016-12-08 ENCOUNTER — Encounter: Payer: Self-pay | Admitting: Orthopaedic Surgery

## 2016-12-08 ENCOUNTER — Ambulatory Visit (INDEPENDENT_AMBULATORY_CARE_PROVIDER_SITE_OTHER): Payer: PPO | Admitting: Orthopaedic Surgery

## 2016-12-08 VITALS — BP 161/109 | HR 86 | Temp 97.2°F | Ht 79.0 in | Wt 215.0 lb

## 2016-12-08 DIAGNOSIS — F17210 Nicotine dependence, cigarettes, uncomplicated: Secondary | ICD-10-CM | POA: Diagnosis not present

## 2016-12-08 DIAGNOSIS — M25512 Pain in left shoulder: Secondary | ICD-10-CM | POA: Diagnosis not present

## 2016-12-08 DIAGNOSIS — G8929 Other chronic pain: Secondary | ICD-10-CM

## 2016-12-08 NOTE — Patient Instructions (Signed)
Steps to Quit Smoking Smoking tobacco can be bad for your health. It can also affect almost every organ in your body. Smoking puts you and people around you at risk for many serious long-lasting (chronic) diseases. Quitting smoking is hard, but it is one of the best things that you can do for your health. It is never too late to quit. What are the benefits of quitting smoking? When you quit smoking, you lower your risk for getting serious diseases and conditions. They can include:  Lung cancer or lung disease.  Heart disease.  Stroke.  Heart attack.  Not being able to have children (infertility).  Weak bones (osteoporosis) and broken bones (fractures). If you have coughing, wheezing, and shortness of breath, those symptoms may get better when you quit. You may also get sick less often. If you are pregnant, quitting smoking can help to lower your chances of having a baby of low birth weight. What can I do to help me quit smoking? Talk with your doctor about what can help you quit smoking. Some things you can do (strategies) include:  Quitting smoking totally, instead of slowly cutting back how much you smoke over a period of time.  Going to in-person counseling. You are more likely to quit if you go to many counseling sessions.  Using resources and support systems, such as:  Online chats with a counselor.  Phone quitlines.  Printed self-help materials.  Support groups or group counseling.  Text messaging programs.  Mobile phone apps or applications.  Taking medicines. Some of these medicines may have nicotine in them. If you are pregnant or breastfeeding, do not take any medicines to quit smoking unless your doctor says it is okay. Talk with your doctor about counseling or other things that can help you. Talk with your doctor about using more than one strategy at the same time, such as taking medicines while you are also going to in-person counseling. This can help make quitting  easier. What things can I do to make it easier to quit? Quitting smoking might feel very hard at first, but there is a lot that you can do to make it easier. Take these steps:  Talk to your family and friends. Ask them to support and encourage you.  Call phone quitlines, reach out to support groups, or work with a counselor.  Ask people who smoke to not smoke around you.  Avoid places that make you want (trigger) to smoke, such as:  Bars.  Parties.  Smoke-break areas at work.  Spend time with people who do not smoke.  Lower the stress in your life. Stress can make you want to smoke. Try these things to help your stress:  Getting regular exercise.  Deep-breathing exercises.  Yoga.  Meditating.  Doing a body scan. To do this, close your eyes, focus on one area of your body at a time from head to toe, and notice which parts of your body are tense. Try to relax the muscles in those areas.  Download or buy apps on your mobile phone or tablet that can help you stick to your quit plan. There are many free apps, such as QuitGuide from the CDC (Centers for Disease Control and Prevention). You can find more support from smokefree.gov and other websites. This information is not intended to replace advice given to you by your health care provider. Make sure you discuss any questions you have with your health care provider. Document Released: 07/03/2009 Document Revised: 05/04/2016 Document   Reviewed: 01/21/2015 Elsevier Interactive Patient Education  2017 Elsevier Inc.  

## 2016-12-08 NOTE — Progress Notes (Signed)
[name], male DOB:[birth year], 71 y.o. AYT:[id number]  Chief Complaint  Patient presents with  . Follow-up    CHRONIC LEFT SHOULDER PAIN    HPI  Larry Reese is a 71 y.o. male who has left shoulder pain.  He is stable.  He has no swelling.  He hurt his left scapula about a week ago but he is better today.  He has no paresthesias. HPI  Body mass index is 24.22 kg/m.  ROS  Review of Systems  HENT: Negative for congestion.   Respiratory: Positive for cough. Negative for shortness of breath.   Cardiovascular: Negative for chest pain.  Endocrine: Positive for cold intolerance.  Musculoskeletal: Positive for arthralgias and myalgias.  Allergic/Immunologic: Negative for environmental allergies.  All other systems reviewed and are negative.   Past Medical History:  Diagnosis Date  . Arthritis   . Hx of adenomatous colonic polyps   . Hypertension   . Pneumothorax   . Stroke [facility]) 2005   left sided weakness  . Trigger finger     Past Surgical History:  Procedure Laterality Date  . COLONOSCOPY  May 2010   Dr. fields: 5 cm terminal ileum normal, 6 polyps removed, moderate internal hemorrhoids, simple adenomas, next colonoscopy May 2015  . COLONOSCOPY N/A 08/01/2015   Procedure: COLONOSCOPY;  Surgeon: Danie Binder, MD;  Location: [facility];  Service: Endoscopy;  Laterality: N/A;  0830  . CRANIOTOMY  2000   pressure  . INGUINAL HERNIA REPAIR Right   . OLECRANON BURSECTOMY Right 02/11/2015   Procedure: EXCISION RIGHT OLECRANON BURSA;  Surgeon: Sanjuana Kava, MD;  Location: [facility];  Service: Orthopedics;  Laterality: Right;  . repair of trigger finger Left    pinky    Family History  Problem Relation Age of Onset  . Esophageal cancer Father   . Colon cancer Neg Hx     Social History Social History  Substance Use Topics  . Smoking status: Current Every Day Smoker    Packs/day: 0.75    Years: 50.00    Types: Cigarettes  . Smokeless tobacco:  Never Used  . Alcohol use 3.0 oz/week    5 Cans of beer per week     Comment: 4-6 per day (12 ounces)    Allergies  Allergen Reactions  . Aleve [Naproxen] Other (See Comments)    Numbness in extremities   . Celecoxib Other (See Comments)    Nose bleed.  . Fluoxetine Hcl Swelling    Current Outpatient Prescriptions  Medication Sig Dispense Refill  . ALPRAZolam (XANAX) 0.5 MG tablet Take 0.5 mg by mouth at bedtime as needed for sleep. Reported on 02/19/2016    . amLODipine (NORVASC) 5 MG tablet Take 5 mg by mouth daily.    Marland Kitchen aspirin EC 81 MG tablet Take 81 mg by mouth daily.    . Chlorpheniramine Maleate (ALLERGY PO) Take 1 tablet by mouth daily as needed (allergies). Reported on 02/19/2016    . NORCO 7.5-325 MG tablet Take 1 tablet by mouth every 6 (six) hours as needed for moderate pain (Must last 30 days.Do not drive or operate machinery while taking this medicine.). 75 tablet 0   No current facility-administered medications for this visit.      Physical Exam  Blood pressure (!) 161/109, pulse 86, temperature 97.2 F (36.2 C), height 6\' 7"  (2.007 m), weight 215 lb (97.5 kg).  Constitutional: overall normal hygiene, normal nutrition, well developed, normal grooming, normal body habitus. Assistive device:none  Musculoskeletal: gait and station Limp none, muscle tone and strength are normal, no tremors or atrophy is present.  .  Neurological: coordination overall normal.  Deep tendon reflex/nerve stretch intact.  Sensation normal.  Cranial nerves II-XII intact.   Skin:   Normal overall no scars, lesions, ulcers or rashes. No psoriasis.  Psychiatric: Alert and oriented x 3.  Recent memory intact, remote memory unclear.  Normal mood and affect. Well groomed.  Good eye contact.  Cardiovascular: overall no swelling, no varicosities, no edema bilaterally, normal temperatures of the legs and arms, no clubbing, cyanosis and good capillary refill.  Lymphatic: palpation is  normal.  Examination of left Upper Extremity is done.  Inspection:   Overall:  Elbow non-tender without crepitus or defects, forearm non-tender without crepitus or defects, wrist non-tender without crepitus or defects, hand non-tender.    Shoulder: with glenohumeral joint tenderness, without effusion.   Upper arm: without swelling and tenderness   Range of motion:   Overall:  Full range of motion of the elbow, full range of motion of wrist and full range of motion in fingers.   Shoulder:  left  165 degrees forward flexion; 150 degrees abduction; 35 degrees internal rotation, 35 degrees external rotation, 15 degrees extension, 40 degrees adduction.   Stability:   Overall:  Shoulder, elbow and wrist stable   Strength and Tone:   Overall full shoulder muscles strength, full upper arm strength and normal upper arm bulk and tone.   The patient has been educated about the nature of the problem(s) and counseled on treatment options.  The patient appeared to understand what I have discussed and is in agreement with it.  Encounter Diagnoses  Name Primary?  . Chronic left shoulder pain Yes  . Cigarette nicotine dependence without complication    He has cut back on smoking and smokes a pack every four days now.   PLAN Call if any problems.  Precautions discussed.  Continue current medications.   Return to clinic 3 months   Electronically Signed Sanjuana Kava, MD 3/21/201810:34 AM

## 2016-12-16 DIAGNOSIS — I739 Peripheral vascular disease, unspecified: Secondary | ICD-10-CM | POA: Diagnosis not present

## 2016-12-16 DIAGNOSIS — L851 Acquired keratosis [keratoderma] palmaris et plantaris: Secondary | ICD-10-CM | POA: Diagnosis not present

## 2016-12-16 DIAGNOSIS — B351 Tinea unguium: Secondary | ICD-10-CM | POA: Diagnosis not present

## 2016-12-16 DIAGNOSIS — M79674 Pain in right toe(s): Secondary | ICD-10-CM | POA: Diagnosis not present

## 2016-12-16 DIAGNOSIS — M79675 Pain in left toe(s): Secondary | ICD-10-CM | POA: Diagnosis not present

## 2016-12-29 ENCOUNTER — Telehealth: Payer: Self-pay | Admitting: Orthopaedic Surgery

## 2016-12-29 MED ORDER — NORCO 7.5-325 MG PO TABS: 1.0000 | ORAL_TABLET | Freq: Four times a day (QID) | ORAL | 0 refills | Status: DC | PRN

## 2016-12-29 NOTE — Telephone Encounter (Signed)
Norco  7.5/325 mg  Qty 75 Tablets

## 2017-01-25 ENCOUNTER — Other Ambulatory Visit (HOSPITAL_COMMUNITY): Payer: Self-pay | Admitting: Internal Medicine

## 2017-01-25 DIAGNOSIS — F17209 Nicotine dependence, unspecified, with unspecified nicotine-induced disorders: Secondary | ICD-10-CM | POA: Diagnosis not present

## 2017-01-25 DIAGNOSIS — Z0000 Encounter for general adult medical examination without abnormal findings: Secondary | ICD-10-CM | POA: Diagnosis not present

## 2017-01-25 DIAGNOSIS — M1990 Unspecified osteoarthritis, unspecified site: Secondary | ICD-10-CM | POA: Diagnosis not present

## 2017-01-25 DIAGNOSIS — K7689 Other specified diseases of liver: Secondary | ICD-10-CM | POA: Diagnosis not present

## 2017-01-25 DIAGNOSIS — R945 Abnormal results of liver function studies: Secondary | ICD-10-CM | POA: Diagnosis not present

## 2017-01-25 DIAGNOSIS — F17210 Nicotine dependence, cigarettes, uncomplicated: Secondary | ICD-10-CM | POA: Diagnosis not present

## 2017-01-25 DIAGNOSIS — M79604 Pain in right leg: Secondary | ICD-10-CM

## 2017-01-25 DIAGNOSIS — M549 Dorsalgia, unspecified: Secondary | ICD-10-CM | POA: Diagnosis not present

## 2017-01-25 DIAGNOSIS — M79606 Pain in leg, unspecified: Secondary | ICD-10-CM

## 2017-01-25 DIAGNOSIS — Z1389 Encounter for screening for other disorder: Secondary | ICD-10-CM | POA: Diagnosis not present

## 2017-01-25 DIAGNOSIS — I10 Essential (primary) hypertension: Secondary | ICD-10-CM | POA: Diagnosis not present

## 2017-01-31 ENCOUNTER — Telehealth: Payer: Self-pay

## 2017-01-31 MED ORDER — NORCO 7.5-325 MG PO TABS: 1.0000 | ORAL_TABLET | Freq: Four times a day (QID) | ORAL | 0 refills | Status: DC | PRN

## 2017-01-31 NOTE — Telephone Encounter (Signed)
Pt called requesting pain med. Norco 7.5

## 2017-02-02 ENCOUNTER — Ambulatory Visit (HOSPITAL_COMMUNITY)
Admission: RE | Admit: 2017-02-02 | Discharge: 2017-02-02 | Disposition: A | Discharge status: Home / Self Care | Payer: PPO | Source: Ambulatory Visit | Attending: Internal Medicine | Admitting: Internal Medicine

## 2017-02-02 DIAGNOSIS — M25551 Pain in right hip: Secondary | ICD-10-CM | POA: Diagnosis not present

## 2017-02-02 DIAGNOSIS — M79604 Pain in right leg: Secondary | ICD-10-CM | POA: Diagnosis not present

## 2017-02-02 DIAGNOSIS — M25651 Stiffness of right hip, not elsewhere classified: Secondary | ICD-10-CM | POA: Diagnosis not present

## 2017-02-02 DIAGNOSIS — M79606 Pain in leg, unspecified: Secondary | ICD-10-CM

## 2017-02-02 DIAGNOSIS — I70203 Unspecified atherosclerosis of native arteries of extremities, bilateral legs: Secondary | ICD-10-CM | POA: Insufficient documentation

## 2017-02-09 ENCOUNTER — Other Ambulatory Visit: Payer: Self-pay | Admitting: *Deleted

## 2017-02-09 DIAGNOSIS — I739 Peripheral vascular disease, unspecified: Secondary | ICD-10-CM

## 2017-02-28 DIAGNOSIS — B351 Tinea unguium: Secondary | ICD-10-CM | POA: Diagnosis not present

## 2017-02-28 DIAGNOSIS — L851 Acquired keratosis [keratoderma] palmaris et plantaris: Secondary | ICD-10-CM | POA: Diagnosis not present

## 2017-02-28 DIAGNOSIS — M79674 Pain in right toe(s): Secondary | ICD-10-CM | POA: Diagnosis not present

## 2017-02-28 DIAGNOSIS — M79675 Pain in left toe(s): Secondary | ICD-10-CM | POA: Diagnosis not present

## 2017-02-28 DIAGNOSIS — I739 Peripheral vascular disease, unspecified: Secondary | ICD-10-CM | POA: Diagnosis not present

## 2017-03-02 ENCOUNTER — Telehealth: Payer: Self-pay | Admitting: Orthopaedic Surgery

## 2017-03-02 MED ORDER — NORCO 7.5-325 MG PO TABS: 1.0000 | ORAL_TABLET | Freq: Four times a day (QID) | ORAL | 0 refills | Status: DC | PRN

## 2017-03-02 NOTE — Telephone Encounter (Signed)
Norco 7.5/325 mg  Qty 65 Tablets

## 2017-03-10 ENCOUNTER — Encounter: Payer: Self-pay | Admitting: Orthopaedic Surgery

## 2017-03-10 ENCOUNTER — Ambulatory Visit (INDEPENDENT_AMBULATORY_CARE_PROVIDER_SITE_OTHER): Payer: PPO | Admitting: Orthopaedic Surgery

## 2017-03-10 VITALS — BP 129/82 | HR 68 | Temp 97.1°F | Ht 79.0 in | Wt 210.0 lb

## 2017-03-10 DIAGNOSIS — F17210 Nicotine dependence, cigarettes, uncomplicated: Secondary | ICD-10-CM | POA: Diagnosis not present

## 2017-03-10 DIAGNOSIS — M25512 Pain in left shoulder: Secondary | ICD-10-CM | POA: Diagnosis not present

## 2017-03-10 DIAGNOSIS — G8929 Other chronic pain: Secondary | ICD-10-CM

## 2017-03-10 NOTE — Progress Notes (Signed)
Patient YQ:MVHQI Larry Reese, male DOB:[birth year], 71 y.o. ONG:[id number]  Chief Complaint  Patient presents with  . Follow-up    Left shoulder    HPI  Larry Reese is a 71 y.o. male who has chronic left shoulder pain. He is doing his exercises. He is taking his medicine.  He has no paresthesias.  He has no new trauma. HPI  Body mass index is 23.66 kg/m.  ROS  Review of Systems  HENT: Negative for congestion.   Respiratory: Positive for cough. Negative for shortness of breath.   Cardiovascular: Negative for chest pain.  Endocrine: Positive for cold intolerance.  Musculoskeletal: Positive for arthralgias and myalgias.  Allergic/Immunologic: Negative for environmental allergies.  All other systems reviewed and are negative.   Past Medical History:  Diagnosis Date  . Arthritis   . Hx of adenomatous colonic polyps   . Hypertension   . Pneumothorax   . Stroke [facility]) 2005   left sided weakness  . Trigger finger     Past Surgical History:  Procedure Laterality Date  . COLONOSCOPY  May 2010   Dr. fields: 5 cm terminal ileum normal, 6 polyps removed, moderate internal hemorrhoids, simple adenomas, next colonoscopy May 2015  . COLONOSCOPY N/A 08/01/2015   Procedure: COLONOSCOPY;  Surgeon: Danie Binder, MD;  Location: [facility];  Service: Endoscopy;  Laterality: N/A;  0830  . CRANIOTOMY  2000   pressure  . INGUINAL HERNIA REPAIR Right   . OLECRANON BURSECTOMY Right 02/11/2015   Procedure: EXCISION RIGHT OLECRANON BURSA;  Surgeon: Sanjuana Kava, MD;  Location: [facility];  Service: Orthopedics;  Laterality: Right;  . repair of trigger finger Left    pinky    Family History  Problem Relation Age of Onset  . Esophageal cancer Father   . Colon cancer Neg Hx     Social History Social History  Substance Use Topics  . Smoking status: Current Every Day Smoker    Packs/day: 0.75    Years: 50.00    Types: Cigarettes  . Smokeless tobacco: Never Used  . Alcohol use 3.0  oz/week    5 Cans of beer per week     Comment: 4-6 per day (12 ounces)    Allergies  Allergen Reactions  . Aleve [Naproxen] Other (See Comments)    Numbness in extremities   . Celecoxib Other (See Comments)    Nose bleed.  . Fluoxetine Hcl Swelling    Current Outpatient Prescriptions  Medication Sig Dispense Refill  . ALPRAZolam (XANAX) 0.5 MG tablet Take 0.5 mg by mouth at bedtime as needed for sleep. Reported on 02/19/2016    . amLODipine (NORVASC) 5 MG tablet Take 5 mg by mouth daily.    Marland Kitchen aspirin EC 81 MG tablet Take 81 mg by mouth daily.    . Chlorpheniramine Maleate (ALLERGY PO) Take 1 tablet by mouth daily as needed (allergies). Reported on 02/19/2016    . NORCO 7.5-325 MG tablet Take 1 tablet by mouth every 6 (six) hours as needed for moderate pain (Must last 30 days.Do not drive or operate machinery while taking this medicine.). 60 tablet 0   No current facility-administered medications for this visit.      Physical Exam  Blood pressure 129/82, pulse 68, temperature 97.1 F (36.2 C), height 6\' 7"  (2.007 m), weight 210 lb (95.3 kg).  Constitutional: overall normal hygiene, normal nutrition, well developed, normal grooming, normal body habitus. Assistive device:none  Musculoskeletal: gait and station Limp none, muscle tone and  strength are normal, no tremors or atrophy is present.  .  Neurological: coordination overall normal.  Deep tendon reflex/nerve stretch intact.  Sensation normal.  Cranial nerves II-XII intact.   Skin:   Normal overall no scars, lesions, ulcers or rashes. No psoriasis.  Psychiatric: Alert and oriented x 3.  Recent memory intact, remote memory unclear.  Normal mood and affect. Well groomed.  Good eye contact.  Cardiovascular: overall no swelling, no varicosities, no edema bilaterally, normal temperatures of the legs and arms, no clubbing, cyanosis and good capillary refill.  Lymphatic: palpation is normal.  Examination of left Upper Extremity  is done.  Inspection:   Overall:  Elbow non-tender without crepitus or defects, forearm non-tender without crepitus or defects, wrist non-tender without crepitus or defects, hand non-tender.    Shoulder: with glenohumeral joint tenderness, without effusion.   Upper arm: with swelling and tenderness   Range of motion:   Overall:  Full range of motion of the elbow, full range of motion of wrist and full range of motion in fingers.   Shoulder:  left  165 degrees forward flexion; 145 degrees abduction; 35 degrees internal rotation, 35 degrees external rotation, 15 degrees extension, 40 degrees adduction.   Stability:   Overall:  Shoulder, elbow and wrist stable   Strength and Tone:   Overall full shoulder muscles strength, full upper arm strength and normal upper arm bulk and tone.   The patient has been educated about the nature of the problem(s) and counseled on treatment options.  The patient appeared to understand what I have discussed and is in agreement with it.  Encounter Diagnoses  Name Primary?  . Chronic left shoulder pain Yes  . Cigarette nicotine dependence without complication     PLAN Call if any problems.  Precautions discussed.  Continue current medications.   Return to clinic 3 months   Electronically Signed Sanjuana Kava, MD 6/21/201810:16 AM

## 2017-03-10 NOTE — Patient Instructions (Signed)
Steps to Quit Smoking Smoking tobacco can be bad for your health. It can also affect almost every organ in your body. Smoking puts you and people around you at risk for many serious Evon Dejarnett-lasting (chronic) diseases. Quitting smoking is hard, but it is one of the best things that you can do for your health. It is never too late to quit. What are the benefits of quitting smoking? When you quit smoking, you lower your risk for getting serious diseases and conditions. They can include:  Lung cancer or lung disease.  Heart disease.  Stroke.  Heart attack.  Not being able to have children (infertility).  Weak bones (osteoporosis) and broken bones (fractures).  If you have coughing, wheezing, and shortness of breath, those symptoms may get better when you quit. You may also get sick less often. If you are pregnant, quitting smoking can help to lower your chances of having a baby of low birth weight. What can I do to help me quit smoking? Talk with your doctor about what can help you quit smoking. Some things you can do (strategies) include:  Quitting smoking totally, instead of slowly cutting back how much you smoke over a period of time.  Going to in-person counseling. You are more likely to quit if you go to many counseling sessions.  Using resources and support systems, such as: ? Online chats with a counselor. ? Phone quitlines. ? Printed self-help materials. ? Support groups or group counseling. ? Text messaging programs. ? Mobile phone apps or applications.  Taking medicines. Some of these medicines may have nicotine in them. If you are pregnant or breastfeeding, do not take any medicines to quit smoking unless your doctor says it is okay. Talk with your doctor about counseling or other things that can help you.  Talk with your doctor about using more than one strategy at the same time, such as taking medicines while you are also going to in-person counseling. This can help make  quitting easier. What things can I do to make it easier to quit? Quitting smoking might feel very hard at first, but there is a lot that you can do to make it easier. Take these steps:  Talk to your family and friends. Ask them to support and encourage you.  Call phone quitlines, reach out to support groups, or work with a counselor.  Ask people who smoke to not smoke around you.  Avoid places that make you want (trigger) to smoke, such as: ? Bars. ? Parties. ? Smoke-break areas at work.  Spend time with people who do not smoke.  Lower the stress in your life. Stress can make you want to smoke. Try these things to help your stress: ? Getting regular exercise. ? Deep-breathing exercises. ? Yoga. ? Meditating. ? Doing a body scan. To do this, close your eyes, focus on one area of your body at a time from head to toe, and notice which parts of your body are tense. Try to relax the muscles in those areas.  Download or buy apps on your mobile phone or tablet that can help you stick to your quit plan. There are many free apps, such as QuitGuide from the CDC (Centers for Disease Control and Prevention). You can find more support from smokefree.gov and other websites.  This information is not intended to replace advice given to you by your health care provider. Make sure you discuss any questions you have with your health care provider. Document Released: 07/03/2009 Document   Revised: 05/04/2016 Document Reviewed: 01/21/2015 Elsevier Interactive Patient Education  2018 Elsevier Inc.  

## 2017-03-14 DIAGNOSIS — I739 Peripheral vascular disease, unspecified: Secondary | ICD-10-CM | POA: Diagnosis not present

## 2017-03-14 DIAGNOSIS — L97529 Non-pressure chronic ulcer of other part of left foot with unspecified severity: Secondary | ICD-10-CM | POA: Diagnosis not present

## 2017-03-14 DIAGNOSIS — M79675 Pain in left toe(s): Secondary | ICD-10-CM | POA: Diagnosis not present

## 2017-03-21 ENCOUNTER — Encounter: Payer: Self-pay | Admitting: Vascular Surgery

## 2017-03-28 DIAGNOSIS — I739 Peripheral vascular disease, unspecified: Secondary | ICD-10-CM | POA: Diagnosis not present

## 2017-03-28 DIAGNOSIS — L97529 Non-pressure chronic ulcer of other part of left foot with unspecified severity: Secondary | ICD-10-CM | POA: Diagnosis not present

## 2017-03-31 ENCOUNTER — Ambulatory Visit (INDEPENDENT_AMBULATORY_CARE_PROVIDER_SITE_OTHER): Payer: PPO | Admitting: Vascular Surgery

## 2017-03-31 ENCOUNTER — Encounter: Payer: Self-pay | Admitting: Vascular Surgery

## 2017-03-31 ENCOUNTER — Ambulatory Visit (HOSPITAL_COMMUNITY)
Admission: RE | Admit: 2017-03-31 | Discharge: 2017-03-31 | Disposition: A | Discharge status: Home / Self Care | Payer: PPO | Source: Ambulatory Visit | Attending: Vascular Surgery | Admitting: Vascular Surgery

## 2017-03-31 VITALS — BP 129/83 | HR 67 | Temp 98.0°F | Resp 20 | Ht 79.0 in | Wt 209.0 lb

## 2017-03-31 DIAGNOSIS — I739 Peripheral vascular disease, unspecified: Secondary | ICD-10-CM

## 2017-03-31 LAB — VAS US LOWER EXTREMITY ARTERIAL DUPLEX
LPTIBDISTSYS: 229 cm/s
LSFMPSV: -73 cm/s
Left ant tibial distal sys: 211 cm/s
Left super femoral dist sys PSV: -58 cm/s
Left super femoral prox sys PSV: -60 cm/s
RIGHT ANT DIST TIBAL SYS PSV: -46 cm/s
RSFMPSV: -58 cm/s
RTIBDISTSYS: 26 cm/s
Right peroneal sys PSV: 33 cm/s
Right super femoral dist sys PSV: -48 cm/s
Right super femoral prox sys PSV: -57 cm/s

## 2017-03-31 NOTE — Progress Notes (Signed)
Referring Physician: Dr Legrand Rams  Patient name: Larry Reese MRN: [id number] DOB: 1946-08-30 Sex: male  REASON FOR CONSULT: Right leg pain  HPI: Larry Reese is a 71 y.o. male with several year history of right leg pain. This initially started 2 years ago. The patient had pain in his right groin and anterior thigh. This improved a little bit but he still has pain primarily in his right thigh. He also has some weakness and pain that occurs in his right lower extremity. He denies calf pain with walking. But states the leg does begin to hurt after walking one block. He denies rest pain. He has no nonhealing wounds. He denies family history of abdominal aortic aneurysm. He does smoke a half pack of cigarettes per day. Greater than 3 minutes today spent regarding smoking cessation. Other medical problems include degenerative arthritis and hypertension both of which are currently stable.   Past Medical History:  Diagnosis Date  . Arthritis   . Hx of adenomatous colonic polyps   . Hypertension   . Pneumothorax   . Stroke [facility]) 2005   left sided weakness  . Trigger finger    Past Surgical History:  Procedure Laterality Date  . COLONOSCOPY  May 2010   Dr. fields: 5 cm terminal ileum normal, 6 polyps removed, moderate internal hemorrhoids, simple adenomas, next colonoscopy May 2015  . COLONOSCOPY N/A 08/01/2015   Procedure: COLONOSCOPY;  Surgeon: Danie Binder, MD;  Location: [facility];  Service: Endoscopy;  Laterality: N/A;  0830  . CRANIOTOMY  2000   pressure  . INGUINAL HERNIA REPAIR Right   . OLECRANON BURSECTOMY Right 02/11/2015   Procedure: EXCISION RIGHT OLECRANON BURSA;  Surgeon: Sanjuana Kava, MD;  Location: [facility];  Service: Orthopedics;  Laterality: Right;  . repair of trigger finger Left    pinky    Family History  Problem Relation Age of Onset  . Esophageal cancer Father   . Colon cancer Neg Hx     SOCIAL HISTORY: Social History   Social History  .  Marital status: Married    Spouse name: N/A  . Number of children: 3  . Years of education: N/A   Occupational History  . retired    Social History Main Topics  . Smoking status: Current Every Day Smoker    Packs/day: 0.50    Years: 50.00    Types: Cigarettes  . Smokeless tobacco: Never Used     Comment: 1/2 pk per day  . Alcohol use 3.0 oz/week    5 Cans of beer per week     Comment: 4-6 per day (12 ounces)  . Drug use: No  . Sexual activity: Yes    Birth control/ protection: None   Other Topics Concern  . Not on file   Social History Narrative  . No narrative on file    Allergies  Allergen Reactions  . Aleve [Naproxen] Other (See Comments)    Numbness in extremities   . Celecoxib Other (See Comments)    Celebrex. Nose bleed.  . Fluoxetine Hcl Swelling    Current Outpatient Prescriptions  Medication Sig Dispense Refill  . ALPRAZolam (XANAX) 0.5 MG tablet Take 0.5 mg by mouth at bedtime as needed for sleep. Reported on 02/19/2016    . amLODipine (NORVASC) 10 MG tablet Take 10 mg by mouth daily.    Marland Kitchen aspirin EC 81 MG tablet Take 81 mg by mouth daily.    . Chlorpheniramine Maleate (ALLERGY PO)  Take 1 tablet by mouth daily as needed (allergies). Reported on 02/19/2016    . NORCO 7.5-325 MG tablet Take 1 tablet by mouth every 6 (six) hours as needed for moderate pain (Must last 30 days.Do not drive or operate machinery while taking this medicine.). 60 tablet 0   No current facility-administered medications for this visit.     ROS:   General:  No weight loss, Fever, chills  HEENT: No recent headaches, no nasal bleeding, no visual changes, no sore throat  Neurologic: No dizziness, blackouts, seizures. No recent symptoms of stroke or mini- stroke. No recent episodes of slurred speech, or temporary blindness.  Cardiac: No recent episodes of chest pain/pressure, no shortness of breath at rest.  +shortness of breath with exertion.  Denies history of atrial fibrillation  or irregular heartbeat  Vascular: No history of rest pain in feet.  No history of claudication.  No history of non-healing ulcer, No history of DVT   Pulmonary: No home oxygen, no productive cough, no hemoptysis,  No asthma or wheezing  Musculoskeletal:  [X]  Arthritis, [ ]  Low back pain,  [X]  Joint pain  Hematologic:No history of hypercoagulable state.  No history of easy bleeding.  No history of anemia  Gastrointestinal: No hematochezia or melena,  No gastroesophageal reflux, no trouble swallowing  Urinary: [ ]  chronic Kidney disease, [ ]  on HD - [ ]  MWF or [ ]  TTHS, [ ]  Burning with urination, [ ]  Frequent urination, [ ]  Difficulty urinating;   Skin: No rashes  Psychological: No history of anxiety,  No history of depression   Physical Examination  Vitals:   03/31/17 0955  BP: 129/83  Pulse: 67  Resp: 20  Temp: 98 F (36.7 C)  TempSrc: Oral  SpO2: 99%  Weight: 209 lb (94.8 kg)  Height: 6\' 7"  (2.007 m)    Body mass index is 23.54 kg/m.  General:  Alert and oriented, no acute distress HEENT: Normal Neck: No bruit or JVD Pulmonary: Clear to auscultation bilaterally Cardiac: Regular Rate and Rhythm without murmur Abdomen: Soft, non-tender, non-distended, no mass, no scars Skin: No rash Extremity Pulses:  2+ radial, brachial, femoral, Absent dorsalis pedis, posterior tibial pulses bilaterally Musculoskeletal: No deformity or edema  Neurologic: Upper and lower extremity motor 5/5 and symmetric  DATA:  Patient had bilateral ABIs performed at [facility] 02/02/2017. There were 0.7 on the right 0.71 on the left  ASSESSMENT:  Patient with bilateral lower extremity arterial occlusive disease. I do not believe that the right thigh pain or groin pain is related to this. However some of the weakness symptoms that he has in his right leg may be related. However, he does not really have any symptoms in the left leg which has similar perfusion.   PLAN:  The patient  currently has some element of peripheral arterial disease. He does not really feel debilitated by this. I believe the best option initially would be conservative management with smoking cessation walking program of 30 minutes daily. The patient will return for follow-up with repeat ABIs in 6 months time. If his symptoms become worse over time we would consider an intervention at that point.  Ruta Hinds, MD [facility] Office: [phone] Pager: [phone]

## 2017-04-04 ENCOUNTER — Telehealth: Payer: Self-pay | Admitting: Orthopaedic Surgery

## 2017-04-04 MED ORDER — NORCO 7.5-325 MG PO TABS: 1.0000 | ORAL_TABLET | Freq: Four times a day (QID) | ORAL | 0 refills | Status: DC | PRN

## 2017-04-04 NOTE — Telephone Encounter (Signed)
Patient called for refill:  NORCO 7.5-325 MG tablet 60 tablet

## 2017-04-04 NOTE — Addendum Note (Signed)
Addended by: Lianne Cure A on: 04/04/2017 03:05 PM   Modules accepted: Orders

## 2017-04-25 NOTE — Telephone Encounter (Signed)
Sign off

## 2017-04-28 DIAGNOSIS — F17210 Nicotine dependence, cigarettes, uncomplicated: Secondary | ICD-10-CM | POA: Diagnosis not present

## 2017-04-28 DIAGNOSIS — F17200 Nicotine dependence, unspecified, uncomplicated: Secondary | ICD-10-CM | POA: Diagnosis not present

## 2017-04-28 DIAGNOSIS — I10 Essential (primary) hypertension: Secondary | ICD-10-CM | POA: Diagnosis not present

## 2017-04-28 DIAGNOSIS — M1990 Unspecified osteoarthritis, unspecified site: Secondary | ICD-10-CM | POA: Diagnosis not present

## 2017-05-04 ENCOUNTER — Telehealth: Payer: Self-pay | Admitting: Orthopaedic Surgery

## 2017-05-04 MED ORDER — NORCO 7.5-325 MG PO TABS: 1.0000 | ORAL_TABLET | Freq: Four times a day (QID) | ORAL | 0 refills | Status: DC | PRN

## 2017-05-04 NOTE — Telephone Encounter (Signed)
Patient requests refill on Hydrocodone/Acetaminophen (Norco)  7.5-325  Mgs.   Qty  55       Sig: Take 1 tablet by mouth every 6 (six) hours as needed for moderate pain (Must last 30 days.Do not drive or operate machinery while taking this medicine.).

## 2017-05-09 DIAGNOSIS — L851 Acquired keratosis [keratoderma] palmaris et plantaris: Secondary | ICD-10-CM | POA: Diagnosis not present

## 2017-05-09 DIAGNOSIS — I739 Peripheral vascular disease, unspecified: Secondary | ICD-10-CM | POA: Diagnosis not present

## 2017-05-09 DIAGNOSIS — M79674 Pain in right toe(s): Secondary | ICD-10-CM | POA: Diagnosis not present

## 2017-05-09 DIAGNOSIS — B351 Tinea unguium: Secondary | ICD-10-CM | POA: Diagnosis not present

## 2017-05-09 DIAGNOSIS — M79675 Pain in left toe(s): Secondary | ICD-10-CM | POA: Diagnosis not present

## 2017-06-06 ENCOUNTER — Telehealth: Payer: Self-pay | Admitting: Orthopaedic Surgery

## 2017-06-06 NOTE — Telephone Encounter (Signed)
Patient requests refill on Hydrocodone/Acetaminohen 7.5-325  Mgs.   Qty  50       Sig: Take 1 tablet by mouth every 6 (six) hours as needed for moderate pain (Must last 30 days.Do not drive or operate machinery while taking this medicine.).

## 2017-06-07 MED ORDER — NORCO 7.5-325 MG PO TABS: 1.0000 | ORAL_TABLET | Freq: Four times a day (QID) | ORAL | 0 refills | Status: DC | PRN

## 2017-06-09 ENCOUNTER — Ambulatory Visit (INDEPENDENT_AMBULATORY_CARE_PROVIDER_SITE_OTHER): Payer: PPO | Admitting: Orthopaedic Surgery

## 2017-06-09 ENCOUNTER — Encounter: Payer: Self-pay | Admitting: Orthopaedic Surgery

## 2017-06-09 VITALS — BP 134/85 | HR 68 | Temp 97.2°F | Ht 79.0 in | Wt 209.0 lb

## 2017-06-09 DIAGNOSIS — G8929 Other chronic pain: Secondary | ICD-10-CM

## 2017-06-09 DIAGNOSIS — M25512 Pain in left shoulder: Secondary | ICD-10-CM

## 2017-06-09 DIAGNOSIS — F17210 Nicotine dependence, cigarettes, uncomplicated: Secondary | ICD-10-CM | POA: Diagnosis not present

## 2017-06-09 NOTE — Progress Notes (Signed)
[name], male DOB:1946/02/23, 71 y.o. OEU:[id number]  Chief Complaint  Patient presents with  . Follow-up    left shoulder    HPI  Larry Reese is a 71 y.o. male who has chronic left shoulder pain.  He has no new trauma, no paresthesias.  He has no swelling.  He is taking his medicine and doing his exercises.  He has Dupuytren contracture on the right and wants to have something done for it next year. HPI  Body mass index is 23.54 kg/m.  ROS  Review of Systems  HENT: Negative for congestion.   Respiratory: Positive for cough. Negative for shortness of breath.   Cardiovascular: Negative for chest pain.  Endocrine: Positive for cold intolerance.  Musculoskeletal: Positive for arthralgias and myalgias.  Allergic/Immunologic: Negative for environmental allergies.  All other systems reviewed and are negative.   Past Medical History:  Diagnosis Date  . Arthritis   . Hx of adenomatous colonic polyps   . Hypertension   . Pneumothorax   . Stroke [facility]) 2005   left sided weakness  . Trigger finger     Past Surgical History:  Procedure Laterality Date  . COLONOSCOPY  May 2010   Dr. fields: 5 cm terminal ileum normal, 6 polyps removed, moderate internal hemorrhoids, simple adenomas, next colonoscopy May 2015  . COLONOSCOPY N/A 08/01/2015   Procedure: COLONOSCOPY;  Surgeon: Danie Binder, MD;  Location: [facility];  Service: Endoscopy;  Laterality: N/A;  0830  . CRANIOTOMY  2000   pressure  . INGUINAL HERNIA REPAIR Right   . OLECRANON BURSECTOMY Right 02/11/2015   Procedure: EXCISION RIGHT OLECRANON BURSA;  Surgeon: Sanjuana Kava, MD;  Location: [facility];  Service: Orthopedics;  Laterality: Right;  . repair of trigger finger Left    pinky    Family History  Problem Relation Age of Onset  . Esophageal cancer Father   . Colon cancer Neg Hx     Social History Social History  Substance Use Topics  . Smoking status: Current Every Day Smoker   Packs/day: 0.50    Years: 50.00    Types: Cigarettes  . Smokeless tobacco: Never Used     Comment: 1/2 pk per day  . Alcohol use 3.0 oz/week    5 Cans of beer per week     Comment: 4-6 per day (12 ounces)    Allergies  Allergen Reactions  . Aleve [Naproxen] Other (See Comments)    Numbness in extremities   . Celecoxib Other (See Comments)    Celebrex. Nose bleed.  . Fluoxetine Hcl Swelling    Current Outpatient Prescriptions  Medication Sig Dispense Refill  . ALPRAZolam (XANAX) 0.5 MG tablet Take 0.5 mg by mouth at bedtime as needed for sleep. Reported on 02/19/2016    . amLODipine (NORVASC) 10 MG tablet Take 10 mg by mouth daily.    Marland Kitchen aspirin EC 81 MG tablet Take 81 mg by mouth daily.    . Chlorpheniramine Maleate (ALLERGY PO) Take 1 tablet by mouth daily as needed (allergies). Reported on 02/19/2016    . NORCO 7.5-325 MG tablet Take 1 tablet by mouth every 6 (six) hours as needed for moderate pain (Must last 30 days.Do not drive or operate machinery while taking this medicine.). 45 tablet 0   No current facility-administered medications for this visit.      Physical Exam  Blood pressure 134/85, pulse 68, temperature (!) 97.2 F (36.2 C), height 6\' 7"  (2.007 m), weight 209  lb (94.8 kg).  Constitutional: overall normal hygiene, normal nutrition, well developed, normal grooming, normal body habitus. Assistive device:none  Musculoskeletal: gait and station Limp none, muscle tone and strength are normal, no tremors or atrophy is present.  .  Neurological: coordination overall normal.  Deep tendon reflex/nerve stretch intact.  Sensation normal.  Cranial nerves II-XII intact.   Skin:   Normal overall no scars, lesions, ulcers or rashes. No psoriasis.  Psychiatric: Alert and oriented x 3.  Recent memory intact, remote memory unclear.  Normal mood and affect. Well groomed.  Good eye contact.  Cardiovascular: overall no swelling, no varicosities, no edema bilaterally, normal  temperatures of the legs and arms, no clubbing, cyanosis and good capillary refill.  Lymphatic: palpation is normal.  All other systems reviewed and are negative   Examination of left Upper Extremity is done.  Inspection:   Overall:  Elbow non-tender without crepitus or defects, forearm non-tender without crepitus or defects, wrist non-tender without crepitus or defects, hand non-tender.    Shoulder: with glenohumeral joint tenderness, without effusion.   Upper arm: without swelling and tenderness   Range of motion:   Overall:  Full range of motion of the elbow, full range of motion of wrist and full range of motion in fingers.   Shoulder:  left  165 degrees forward flexion; 150 degrees abduction; 35 degrees internal rotation, 35 degrees external rotation, 15 degrees extension, 40 degrees adduction.   Stability:   Overall:  Shoulder, elbow and wrist stable   Strength and Tone:   Overall full shoulder muscles strength, full upper arm strength and normal upper arm bulk and tone.  The patient has been educated about the nature of the problem(s) and counseled on treatment options.  The patient appeared to understand what I have discussed and is in agreement with it.  He continues to smoke and is not willing to cut back further.  Encounter Diagnoses  Name Primary?  . Chronic left shoulder pain Yes  . Cigarette nicotine dependence without complication     PLAN Call if any problems.  Precautions discussed.  Continue current medications.   Return to clinic 3 months   Electronically Signed Sanjuana Kava, MD 9/20/20189:56 AM

## 2017-06-09 NOTE — Patient Instructions (Signed)
Steps to Quit Smoking Smoking tobacco can be bad for your health. It can also affect almost every organ in your body. Smoking puts you and people around you at risk for many serious Larry Reese-lasting (chronic) diseases. Quitting smoking is hard, but it is one of the best things that you can do for your health. It is never too late to quit. What are the benefits of quitting smoking? When you quit smoking, you lower your risk for getting serious diseases and conditions. They can include:  Lung cancer or lung disease.  Heart disease.  Stroke.  Heart attack.  Not being able to have children (infertility).  Weak bones (osteoporosis) and broken bones (fractures).  If you have coughing, wheezing, and shortness of breath, those symptoms may get better when you quit. You may also get sick less often. If you are pregnant, quitting smoking can help to lower your chances of having a baby of low birth weight. What can I do to help me quit smoking? Talk with your doctor about what can help you quit smoking. Some things you can do (strategies) include:  Quitting smoking totally, instead of slowly cutting back how much you smoke over a period of time.  Going to in-person counseling. You are more likely to quit if you go to many counseling sessions.  Using resources and support systems, such as: ? Online chats with a counselor. ? Phone quitlines. ? Printed self-help materials. ? Support groups or group counseling. ? Text messaging programs. ? Mobile phone apps or applications.  Taking medicines. Some of these medicines may have nicotine in them. If you are pregnant or breastfeeding, do not take any medicines to quit smoking unless your doctor says it is okay. Talk with your doctor about counseling or other things that can help you.  Talk with your doctor about using more than one strategy at the same time, such as taking medicines while you are also going to in-person counseling. This can help make  quitting easier. What things can I do to make it easier to quit? Quitting smoking might feel very hard at first, but there is a lot that you can do to make it easier. Take these steps:  Talk to your family and friends. Ask them to support and encourage you.  Call phone quitlines, reach out to support groups, or work with a counselor.  Ask people who smoke to not smoke around you.  Avoid places that make you want (trigger) to smoke, such as: ? Bars. ? Parties. ? Smoke-break areas at work.  Spend time with people who do not smoke.  Lower the stress in your life. Stress can make you want to smoke. Try these things to help your stress: ? Getting regular exercise. ? Deep-breathing exercises. ? Yoga. ? Meditating. ? Doing a body scan. To do this, close your eyes, focus on one area of your body at a time from head to toe, and notice which parts of your body are tense. Try to relax the muscles in those areas.  Download or buy apps on your mobile phone or tablet that can help you stick to your quit plan. There are many free apps, such as QuitGuide from the CDC (Centers for Disease Control and Prevention). You can find more support from smokefree.gov and other websites.  This information is not intended to replace advice given to you by your health care provider. Make sure you discuss any questions you have with your health care provider. Document Released: 07/03/2009 Document   Revised: 05/04/2016 Document Reviewed: 01/21/2015 Elsevier Interactive Patient Education  2018 Elsevier Inc.  

## 2017-07-06 ENCOUNTER — Telehealth: Payer: Self-pay | Admitting: Orthopaedic Surgery

## 2017-07-06 MED ORDER — NORCO 7.5-325 MG PO TABS: 1.0000 | ORAL_TABLET | Freq: Four times a day (QID) | ORAL | 0 refills | Status: DC | PRN

## 2017-07-06 NOTE — Telephone Encounter (Signed)
Norco 7.5/325 mg     Pt states you told him to remind you that you was going to up his medication to 2 tablets a day/changing to 60 tablets instead of 45 tablets.

## 2017-08-01 DIAGNOSIS — I739 Peripheral vascular disease, unspecified: Secondary | ICD-10-CM | POA: Diagnosis not present

## 2017-08-01 DIAGNOSIS — B351 Tinea unguium: Secondary | ICD-10-CM | POA: Diagnosis not present

## 2017-08-01 DIAGNOSIS — L851 Acquired keratosis [keratoderma] palmaris et plantaris: Secondary | ICD-10-CM | POA: Diagnosis not present

## 2017-08-01 DIAGNOSIS — M79675 Pain in left toe(s): Secondary | ICD-10-CM | POA: Diagnosis not present

## 2017-08-01 DIAGNOSIS — M79674 Pain in right toe(s): Secondary | ICD-10-CM | POA: Diagnosis not present

## 2017-08-04 ENCOUNTER — Telehealth: Payer: Self-pay | Admitting: Orthopaedic Surgery

## 2017-08-04 MED ORDER — NORCO 7.5-325 MG PO TABS: 1.0000 | ORAL_TABLET | Freq: Four times a day (QID) | ORAL | 0 refills | Status: DC | PRN

## 2017-08-04 NOTE — Telephone Encounter (Signed)
Norco 7.5/325 mg  Qty 60 Tablets

## 2017-08-05 DIAGNOSIS — N400 Enlarged prostate without lower urinary tract symptoms: Secondary | ICD-10-CM | POA: Diagnosis not present

## 2017-08-05 DIAGNOSIS — I10 Essential (primary) hypertension: Secondary | ICD-10-CM | POA: Diagnosis not present

## 2017-08-05 DIAGNOSIS — F17200 Nicotine dependence, unspecified, uncomplicated: Secondary | ICD-10-CM | POA: Diagnosis not present

## 2017-08-05 DIAGNOSIS — F17210 Nicotine dependence, cigarettes, uncomplicated: Secondary | ICD-10-CM | POA: Diagnosis not present

## 2017-08-05 DIAGNOSIS — Z23 Encounter for immunization: Secondary | ICD-10-CM | POA: Diagnosis not present

## 2017-08-11 ENCOUNTER — Other Ambulatory Visit: Payer: Self-pay

## 2017-08-11 ENCOUNTER — Emergency Department (HOSPITAL_COMMUNITY): Payer: PPO

## 2017-08-11 ENCOUNTER — Encounter (HOSPITAL_COMMUNITY): Payer: Self-pay

## 2017-08-11 ENCOUNTER — Emergency Department (HOSPITAL_COMMUNITY)
Admission: EM | Admit: 2017-08-11 | Discharge: 2017-08-11 | Disposition: A | Discharge status: Home / Self Care | Payer: PPO | Attending: Emergency Medicine | Admitting: Emergency Medicine

## 2017-08-11 DIAGNOSIS — K7689 Other specified diseases of liver: Secondary | ICD-10-CM

## 2017-08-11 DIAGNOSIS — Y999 Unspecified external cause status: Secondary | ICD-10-CM | POA: Diagnosis not present

## 2017-08-11 DIAGNOSIS — S199XXA Unspecified injury of neck, initial encounter: Secondary | ICD-10-CM | POA: Diagnosis not present

## 2017-08-11 DIAGNOSIS — S161XXA Strain of muscle, fascia and tendon at neck level, initial encounter: Secondary | ICD-10-CM

## 2017-08-11 DIAGNOSIS — I10 Essential (primary) hypertension: Secondary | ICD-10-CM | POA: Insufficient documentation

## 2017-08-11 DIAGNOSIS — Z8673 Personal history of transient ischemic attack (TIA), and cerebral infarction without residual deficits: Secondary | ICD-10-CM | POA: Insufficient documentation

## 2017-08-11 DIAGNOSIS — Y929 Unspecified place or not applicable: Secondary | ICD-10-CM | POA: Diagnosis not present

## 2017-08-11 DIAGNOSIS — Z79899 Other long term (current) drug therapy: Secondary | ICD-10-CM | POA: Diagnosis not present

## 2017-08-11 DIAGNOSIS — I712 Thoracic aortic aneurysm, without rupture, unspecified: Secondary | ICD-10-CM

## 2017-08-11 DIAGNOSIS — Y92009 Unspecified place in unspecified non-institutional (private) residence as the place of occurrence of the external cause: Secondary | ICD-10-CM

## 2017-08-11 DIAGNOSIS — Y93H2 Activity, gardening and landscaping: Secondary | ICD-10-CM | POA: Insufficient documentation

## 2017-08-11 DIAGNOSIS — F17210 Nicotine dependence, cigarettes, uncomplicated: Secondary | ICD-10-CM | POA: Insufficient documentation

## 2017-08-11 DIAGNOSIS — S0990XA Unspecified injury of head, initial encounter: Secondary | ICD-10-CM | POA: Diagnosis not present

## 2017-08-11 DIAGNOSIS — R531 Weakness: Secondary | ICD-10-CM | POA: Diagnosis not present

## 2017-08-11 DIAGNOSIS — W19XXXA Unspecified fall, initial encounter: Secondary | ICD-10-CM | POA: Insufficient documentation

## 2017-08-11 DIAGNOSIS — S162XXA Laceration of muscle, fascia and tendon at neck level, initial encounter: Secondary | ICD-10-CM | POA: Diagnosis not present

## 2017-08-11 DIAGNOSIS — J439 Emphysema, unspecified: Secondary | ICD-10-CM | POA: Diagnosis not present

## 2017-08-11 LAB — I-STAT CHEM 8, ED
BUN: 14 mg/dL (ref 6–20)
CREATININE: 0.8 mg/dL (ref 0.61–1.24)
Calcium, Ion: 1.12 mmol/L — ABNORMAL LOW (ref 1.15–1.40)
Chloride: 104 mmol/L (ref 101–111)
GLUCOSE: 99 mg/dL (ref 65–99)
HEMATOCRIT: 44 % (ref 39.0–52.0)
Hemoglobin: 15 g/dL (ref 13.0–17.0)
Potassium: 3.7 mmol/L (ref 3.5–5.1)
Sodium: 138 mmol/L (ref 135–145)
TCO2: 23 mmol/L (ref 22–32)

## 2017-08-11 MED ORDER — IOPAMIDOL (ISOVUE-300) INJECTION 61%
80.0000 mL | Freq: Once | INTRAVENOUS | Status: AC | PRN
  Administered 2017-08-11: 80 mL via INTRAVENOUS

## 2017-08-11 MED ORDER — DICLOFENAC SODIUM 1 % TD GEL: 2.0000 g | Freq: Four times a day (QID) | TRANSDERMAL | 0 refills | Status: DC

## 2017-08-11 NOTE — ED Provider Notes (Signed)
[facility] Provider Note   CSN: [id number] Arrival date & time: 08/11/17  1230     History   Chief Complaint Chief Complaint  Patient presents with  . Fall    HPI Larry Reese is a 71 y.o. male with a past medical history of arthritis, HTN, CVA with residula left sided weakness and history of alcohol induced hepatitis presenting with neck pain secondary to fall which occurred 2 days ago when trimming bushes.  HPI  Past Medical History:  Diagnosis Date  . Arthritis   . Hx of adenomatous colonic polyps   . Hypertension   . Pneumothorax   . Stroke [facility]) 2005   left sided weakness  . Trigger finger     Patient Active Problem List   Diagnosis Date Noted  . History of colonic polyps   . Hepatitis, alcoholic 03/54/6568  . ETOH abuse 02/10/2015  . Internal hemorrhoids 07/17/2009  . Colon adenomas 07/15/2009    Past Surgical History:  Procedure Laterality Date  . COLONOSCOPY  May 2010   Dr. fields: 5 cm terminal ileum normal, 6 polyps removed, moderate internal hemorrhoids, simple adenomas, next colonoscopy May 2015  . COLONOSCOPY N/A 08/01/2015   Procedure: COLONOSCOPY;  Surgeon: Danie Binder, MD;  Location: [facility] ENDO SUITE;  Service: Endoscopy;  Laterality: N/A;  0830  . CRANIOTOMY  2000   pressure  . INGUINAL HERNIA REPAIR Right   . OLECRANON BURSECTOMY Right 02/11/2015   Procedure: EXCISION RIGHT OLECRANON BURSA;  Surgeon: Sanjuana Kava, MD;  Location: [facility] ORS;  Service: Orthopedics;  Laterality: Right;  . repair of trigger finger Left    pinky       Home Medications    Prior to Admission medications   Medication Sig Start Date End Date Taking? Authorizing Provider  amLODipine (NORVASC) 10 MG tablet Take 10 mg by mouth daily.   Yes [provider]  aspirin EC 81 MG tablet Take 81 mg by mouth daily.   Yes [provider]  Chlorpheniramine Maleate (ALLERGY PO) Take 1 tablet by mouth daily as needed (allergies).  Reported on 02/19/2016   Yes [provider]  Hunter 7.5-325 MG tablet Take 1 tablet every 6 (six) hours as needed by mouth for moderate pain (Must last 30 days.Do not drive or operate machinery while taking this medicine.). 08/04/17  Yes Sanjuana Kava, MD  ALPRAZolam Duanne [name]) 0.5 MG tablet Take 0.5 mg by mouth at bedtime as needed for sleep. Reported on 02/19/2016    [provider]  diclofenac sodium (VOLTAREN) 1 % GEL Apply 2 g topically 4 (four) times daily. 08/11/17   Evalee Jefferson, PA-C    Family History Family History  Problem Relation Age of Onset  . Esophageal cancer Father   . Colon cancer Neg Hx     Social History Social History   Tobacco Use  . Smoking status: Current Every Day Smoker    Packs/day: 0.50    Years: 50.00    Pack years: 25.00    Types: Cigarettes  . Smokeless tobacco: Never Used  . Tobacco comment: 1/2 pk per day  Substance Use Topics  . Alcohol use: Yes    Alcohol/week: 3.0 oz    Types: 5 Cans of beer per week    Comment: 3-4 beers/day  . Drug use: No     Allergies   Aleve [naproxen]; Celecoxib; and Fluoxetine hcl   Review of Systems Review of Systems   Physical Exam Updated Vital Signs BP Marland Kitchen)  144/107 (BP Location: Left Arm)   Pulse 73   Temp 98.4 F (36.9 C) (Oral)   Resp 18   SpO2 96%   Physical Exam   ED Treatments / Results  Labs (all labs ordered are listed, but only abnormal results are displayed) Labs Reviewed  I-STAT CHEM 8, ED - Abnormal; Notable for the following components:      Result Value   Calcium, Ion 1.12 (*)    All other components within normal limits    EKG  EKG Interpretation None       Radiology Ct Head Wo Contrast  Result Date: 08/11/2017 CLINICAL DATA:  71 year old male status post fall on 08/09/2017 while trimming the blushes. History of prior chronic subdural hematoma. EXAM: CT HEAD WITHOUT CONTRAST CT CERVICAL SPINE WITHOUT CONTRAST TECHNIQUE: Multidetector CT imaging of the  head and cervical spine was performed following the standard protocol without intravenous contrast. Multiplanar CT image reconstructions of the cervical spine were also generated. COMPARISON:  Remote prior head CT 10/27/2004 FINDINGS: CT HEAD FINDINGS Brain: No evidence of acute infarction, hemorrhage, hydrocephalus, extra-axial collection or mass lesion/mass effect. Vascular: No hyperdense vessel or unexpected calcification. Skull: Normal. Negative for fracture or focal lesion. Sinuses/Orbits: No acute finding. Other: None. CT CERVICAL SPINE FINDINGS Alignment: Normal. Skull base and vertebrae: No acute fracture. No primary bone lesion or focal pathologic process. Soft tissues and spinal canal: No prevertebral fluid or swelling. No visible canal hematoma. Disc levels: Multilevel degenerative disc disease beginning at C2-C3 and extending through C6-C7. The cervicothoracic and upper thoracic spine are relatively spared. There may be some mild central stenosis at C4-C5. Advanced facet arthropathy is present, most significant on the right at C2-C3 were there is likely posterior ankylosis. On the left, facet arthropathy is present at C7-T1, as well as at C2-C3. Upper chest: Incompletely imaged 1.2 cm sub solid ground-glass attenuation airspace opacity in the medial aspect of the left upper lobe. Other: Osseous bridging between the right third and fourth ribs suggesting remote prior healed trauma. IMPRESSION: CT HEAD 1. No acute intracranial abnormality. CT CSPINE 1. Incompletely imaged 1.2 cm sub solid ground-glass attenuation airspace opacity in the medial aspect of the left upper lobe. On a single image, this could be artifactual related to volume averaging of the underlying mediastinum, represent a region of focal airspace infiltrate, or the superior most extent of an underlying pulmonary nodule/mass. Further evaluation with dedicated CT scan of the chest is recommended. 2. No acute fracture or malalignment. 3.  Multilevel degenerative disc disease and facet arthropathy. 4. Evidence of remote prior trauma involving the right third and fourth ribs. Electronically Signed   By: Jacqulynn Cadet M.D.   On: 08/11/2017 13:29   Ct Chest W Contrast  Result Date: 08/11/2017 CLINICAL DATA:  [place] 2 days ago with abnormal upper chest on cervical spine CT. Posterior neck pain. Possible left upper lobe nodule on cervical spine CT. EXAM: CT CHEST WITH CONTRAST TECHNIQUE: Multidetector CT imaging of the chest was performed during intravenous contrast administration. CONTRAST:  61mL ISOVUE-300 IOPAMIDOL (ISOVUE-300) INJECTION 61% COMPARISON:  Cervical spine CT of earlier today. Chest radiograph 10/01/2013. FINDINGS: Cardiovascular: Ascending aortic dilatation, including at 4.8 cm on image 93/series 2 in the mid ascending segment. 4.6 cm on coronal reformatted image 52. Within the ascending/transverse junction, 4.3 cm on coronal image 53. Aortic and branch vessel atherosclerosis. Tortuous thoracic aorta. Normal heart size, without pericardial effusion. Multivessel coronary artery atherosclerosis. Mediastinum/Nodes: No mediastinal or hilar adenopathy. Mildly dilated esophagus  with fluid level within on image 89/series 2. Lungs/Pleura: No pleural fluid. Lower lobe predominant bronchial wall thickening. Moderate centrilobular emphysema. No correlate for the possible left apical lung nodule on cervical spine CT. This is likely due to volume averaging. Right apical scarring. Bibasilar scarring and subsegmental atelectasis. Mild left hemidiaphragm elevation. Upper Abdomen: Probable hepatic steatosis with nonspecific vague hypoattenuating 1.4 cm lesion image 148/series 2. Normal imaged portions of the spleen, stomach, pancreas, gallbladder, adrenal glands, kidneys. Musculoskeletal: Remote bilateral rib trauma. IMPRESSION: 1. No pulmonary parenchymal correlate for the cervical spine CT abnormality. This was likely artifactual, due to volume  averaging. 2. Ascending aortic aneurysm, on the order of 4.6-4.8 cm. Recommend semi-annual imaging followup by CTA or MRA and referral to cardiothoracic surgery if not already obtained. This recommendation follows 2010 ACCF/AHA/AATS/ACR/ASA/SCA/SCAI/SIR/STS/SVM Guidelines for the Diagnosis and Management of Patients With Thoracic Aortic Disease. Circulation. 2010; 121: G401-U272 3.  Emphysema (ICD10-J43.9). 4. Esophageal air fluid level suggests dysmotility or gastroesophageal reflux. 5. Probable hepatic steatosis. Indeterminate 1.4 cm right liver lobe lesion. This could be re-evaluated on follow-up CTA/MRA. If more definitive characterization is desired, dedicated outpatient pre and post contrast abdominal MRI could be performed. This should be more strongly considered if there is any history of primary malignancy or known liver disease. Electronically Signed   By: Abigail Miyamoto M.D.   On: 08/11/2017 15:03   Ct Cervical Spine Wo Contrast  Result Date: 08/11/2017 CLINICAL DATA:  71 year old male status post fall on 08/09/2017 while trimming the blushes. History of prior chronic subdural hematoma. EXAM: CT HEAD WITHOUT CONTRAST CT CERVICAL SPINE WITHOUT CONTRAST TECHNIQUE: Multidetector CT imaging of the head and cervical spine was performed following the standard protocol without intravenous contrast. Multiplanar CT image reconstructions of the cervical spine were also generated. COMPARISON:  Remote prior head CT 10/27/2004 FINDINGS: CT HEAD FINDINGS Brain: No evidence of acute infarction, hemorrhage, hydrocephalus, extra-axial collection or mass lesion/mass effect. Vascular: No hyperdense vessel or unexpected calcification. Skull: Normal. Negative for fracture or focal lesion. Sinuses/Orbits: No acute finding. Other: None. CT CERVICAL SPINE FINDINGS Alignment: Normal. Skull base and vertebrae: No acute fracture. No primary bone lesion or focal pathologic process. Soft tissues and spinal canal: No prevertebral  fluid or swelling. No visible canal hematoma. Disc levels: Multilevel degenerative disc disease beginning at C2-C3 and extending through C6-C7. The cervicothoracic and upper thoracic spine are relatively spared. There may be some mild central stenosis at C4-C5. Advanced facet arthropathy is present, most significant on the right at C2-C3 were there is likely posterior ankylosis. On the left, facet arthropathy is present at C7-T1, as well as at C2-C3. Upper chest: Incompletely imaged 1.2 cm sub solid ground-glass attenuation airspace opacity in the medial aspect of the left upper lobe. Other: Osseous bridging between the right third and fourth ribs suggesting remote prior healed trauma. IMPRESSION: CT HEAD 1. No acute intracranial abnormality. CT CSPINE 1. Incompletely imaged 1.2 cm sub solid ground-glass attenuation airspace opacity in the medial aspect of the left upper lobe. On a single image, this could be artifactual related to volume averaging of the underlying mediastinum, represent a region of focal airspace infiltrate, or the superior most extent of an underlying pulmonary nodule/mass. Further evaluation with dedicated CT scan of the chest is recommended. 2. No acute fracture or malalignment. 3. Multilevel degenerative disc disease and facet arthropathy. 4. Evidence of remote prior trauma involving the right third and fourth ribs. Electronically Signed   By: Jacqulynn Cadet M.D.   On:  08/11/2017 13:29    Procedures Procedures (including critical care time)  Medications Ordered in ED Medications  iopamidol (ISOVUE-300) 61 % injection 80 mL (80 mLs Intravenous Contrast Given 08/11/17 1440)     Initial Impression / Assessment and Plan / ED Course  I have reviewed the triage vital signs and the nursing notes.  Pertinent labs & imaging results that were available during my care of the patient were reviewed by me and considered in my medical decision making (see chart for details).      Discussed CT findings including ?lung mass.  Pt desires CT imaging today, which was ordered.  Pt seen by Dr. Wilson Singer during todays visit. CT chest results reviewed, thoracic aortic aneurysm, possible liver nodule.  Outpt f/u recommended and discussed with pt. Advised he needs f/u within the next week to discuss and arrange for other tests including CT abd and to arrange f/u care with [facility] for ongoing management of his aneurysm.  Pt made aware and understands plan.  Pt advised to continue taking his hydrocodone, tizanadine. Added voltaren gel for neck pain.  Final Clinical Impressions(s) / ED Diagnoses   Final diagnoses:  Fall in home, initial encounter  Cervical strain, acute, initial encounter  Thoracic aortic aneurysm without rupture ([place])  Liver nodule    ED Discharge Orders        Ordered    diclofenac sodium (VOLTAREN) 1 % GEL  4 times daily     08/11/17 1347       Evalee Jefferson, PA-C 08/11/17 1518    Virgel Manifold, MD 08/12/17 [phone]

## 2017-08-11 NOTE — ED Notes (Signed)
Pt took one of his own pain pills that he brought with him. PA Evalee Jefferson Informed. Pt wife driving him home.

## 2017-08-11 NOTE — Discharge Instructions (Signed)
Your CT scans show you have arthritis in your neck but no bony injury from your fall on Tuesday.  Continue taking your home medicines including your hydrocodone and your tizanidine.  Use the new medicine prescribed which is a topically applied anti inflammatory pain medicine which you can apply directly your posterior neck.   The CT scan of your chest is negative for a lung mass.  You do have an enlarged aorta with recommendation that you have this rechecked every 6 months to make sure it is stable and not getting larger.  Also, there appears to be a nodule in your liver which will require a CT scan in the near future to further define the nature of this, especially given your history of liver problems. Call your primary doctor for a recheck within the next week and to arrange these follow up tests.

## 2017-08-11 NOTE — ED Provider Notes (Signed)
Medical screening examination/treatment/procedure(s) were conducted as a shared visit with non-physician practitioner(s) and myself.  I personally evaluated the patient during the encounter.   EKG Interpretation None      71yM with neck pain after mechanical fall.  He is neurovascularly intact.  His imaging is fine from a traumatic standpoint.  There was an incidentally noted opacity in his left upper lobe though.  He does have a significant smoking history.  Discussed with him further managing as an outpatient versus in the ED today.  Will place an IV, obtain basic labs and obtain a CT of the chest with contrast.  He will need further outpatient follow-up depending on the findings.  Ct Head Wo Contrast  Result Date: 08/11/2017 CLINICAL DATA:  71 year old male status post fall on 08/09/2017 while trimming the blushes. History of prior chronic subdural hematoma. EXAM: CT HEAD WITHOUT CONTRAST CT CERVICAL SPINE WITHOUT CONTRAST TECHNIQUE: Multidetector CT imaging of the head and cervical spine was performed following the standard protocol without intravenous contrast. Multiplanar CT image reconstructions of the cervical spine were also generated. COMPARISON:  Remote prior head CT 10/27/2004 FINDINGS: CT HEAD FINDINGS Brain: No evidence of acute infarction, hemorrhage, hydrocephalus, extra-axial collection or mass lesion/mass effect. Vascular: No hyperdense vessel or unexpected calcification. Skull: Normal. Negative for fracture or focal lesion. Sinuses/Orbits: No acute finding. Other: None. CT CERVICAL SPINE FINDINGS Alignment: Normal. Skull base and vertebrae: No acute fracture. No primary bone lesion or focal pathologic process. Soft tissues and spinal canal: No prevertebral fluid or swelling. No visible canal hematoma. Disc levels: Multilevel degenerative disc disease beginning at C2-C3 and extending through C6-C7. The cervicothoracic and upper thoracic spine are relatively spared. There may be some  mild central stenosis at C4-C5. Advanced facet arthropathy is present, most significant on the right at C2-C3 were there is likely posterior ankylosis. On the left, facet arthropathy is present at C7-T1, as well as at C2-C3. Upper chest: Incompletely imaged 1.2 cm sub solid ground-glass attenuation airspace opacity in the medial aspect of the left upper lobe. Other: Osseous bridging between the right third and fourth ribs suggesting remote prior healed trauma. IMPRESSION: CT HEAD 1. No acute intracranial abnormality. CT CSPINE 1. Incompletely imaged 1.2 cm sub solid ground-glass attenuation airspace opacity in the medial aspect of the left upper lobe. On a single image, this could be artifactual related to volume averaging of the underlying mediastinum, represent a region of focal airspace infiltrate, or the superior most extent of an underlying pulmonary nodule/mass. Further evaluation with dedicated CT scan of the chest is recommended. 2. No acute fracture or malalignment. 3. Multilevel degenerative disc disease and facet arthropathy. 4. Evidence of remote prior trauma involving the right third and fourth ribs. Electronically Signed   By: Jacqulynn Cadet M.D.   On: 08/11/2017 13:29   Ct Cervical Spine Wo Contrast  Result Date: 08/11/2017 CLINICAL DATA:  71 year old male status post fall on 08/09/2017 while trimming the blushes. History of prior chronic subdural hematoma. EXAM: CT HEAD WITHOUT CONTRAST CT CERVICAL SPINE WITHOUT CONTRAST TECHNIQUE: Multidetector CT imaging of the head and cervical spine was performed following the standard protocol without intravenous contrast. Multiplanar CT image reconstructions of the cervical spine were also generated. COMPARISON:  Remote prior head CT 10/27/2004 FINDINGS: CT HEAD FINDINGS Brain: No evidence of acute infarction, hemorrhage, hydrocephalus, extra-axial collection or mass lesion/mass effect. Vascular: No hyperdense vessel or unexpected calcification. Skull:  Normal. Negative for fracture or focal lesion. Sinuses/Orbits: No acute finding. Other: None.  CT CERVICAL SPINE FINDINGS Alignment: Normal. Skull base and vertebrae: No acute fracture. No primary bone lesion or focal pathologic process. Soft tissues and spinal canal: No prevertebral fluid or swelling. No visible canal hematoma. Disc levels: Multilevel degenerative disc disease beginning at C2-C3 and extending through C6-C7. The cervicothoracic and upper thoracic spine are relatively spared. There may be some mild central stenosis at C4-C5. Advanced facet arthropathy is present, most significant on the right at C2-C3 were there is likely posterior ankylosis. On the left, facet arthropathy is present at C7-T1, as well as at C2-C3. Upper chest: Incompletely imaged 1.2 cm sub solid ground-glass attenuation airspace opacity in the medial aspect of the left upper lobe. Other: Osseous bridging between the right third and fourth ribs suggesting remote prior healed trauma. IMPRESSION: CT HEAD 1. No acute intracranial abnormality. CT CSPINE 1. Incompletely imaged 1.2 cm sub solid ground-glass attenuation airspace opacity in the medial aspect of the left upper lobe. On a single image, this could be artifactual related to volume averaging of the underlying mediastinum, represent a region of focal airspace infiltrate, or the superior most extent of an underlying pulmonary nodule/mass. Further evaluation with dedicated CT scan of the chest is recommended. 2. No acute fracture or malalignment. 3. Multilevel degenerative disc disease and facet arthropathy. 4. Evidence of remote prior trauma involving the right third and fourth ribs. Electronically Signed   By: Jacqulynn Cadet M.D.   On: 08/11/2017 13:29     Virgel Manifold, MD 08/11/17 [phone]

## 2017-08-11 NOTE — ED Triage Notes (Signed)
Pt reports he slipped Tuesday and fell trimming some bushes.  C/O pain in neck.

## 2017-09-08 ENCOUNTER — Encounter: Payer: Self-pay | Admitting: Orthopaedic Surgery

## 2017-09-08 ENCOUNTER — Ambulatory Visit: Payer: PPO | Admitting: Orthopaedic Surgery

## 2017-09-08 VITALS — BP 135/90 | HR 94 | Temp 97.2°F | Ht 79.0 in | Wt 215.0 lb

## 2017-09-08 DIAGNOSIS — M25512 Pain in left shoulder: Secondary | ICD-10-CM | POA: Diagnosis not present

## 2017-09-08 DIAGNOSIS — G8929 Other chronic pain: Secondary | ICD-10-CM | POA: Diagnosis not present

## 2017-09-08 DIAGNOSIS — F17210 Nicotine dependence, cigarettes, uncomplicated: Secondary | ICD-10-CM

## 2017-09-08 MED ORDER — NORCO 7.5-325 MG PO TABS: 1.0000 | ORAL_TABLET | Freq: Four times a day (QID) | ORAL | 0 refills | Status: DC | PRN

## 2017-09-08 NOTE — Progress Notes (Signed)
[name], male DOB:[birth year], 71 y.o. VFI:[id number]  CC:  My shoulder still hurts  HPI  [name] is a 71 y.o. male who has chronic pain of the left shoulder.  He has no new trauma to the shoulder, no paresthesias.  He has increased pain with the cold weather we have had. He fell and hurt his neck about a month ago.  He has been wearing a cervical collar for this.  He is being treated for this by another physician.  It did not make his shoulder worse. HPI   ROS  Review of Systems  HENT: Negative for congestion.   Respiratory: Positive for cough. Negative for shortness of breath.   Cardiovascular: Negative for chest pain.  Endocrine: Positive for cold intolerance.  Musculoskeletal: Positive for arthralgias and myalgias.  Allergic/Immunologic: Negative for environmental allergies.  All other systems reviewed and are negative.   Past Medical History:  Diagnosis Date  . Arthritis   . Hx of adenomatous colonic polyps   . Hypertension   . Pneumothorax   . Stroke [facility]) 2005   left sided weakness  . Trigger finger     Past Surgical History:  Procedure Laterality Date  . COLONOSCOPY  May 2010   Dr. fields: 5 cm terminal ileum normal, 6 polyps removed, moderate internal hemorrhoids, simple adenomas, next colonoscopy May 2015  . COLONOSCOPY N/A 08/01/2015   Procedure: COLONOSCOPY;  Surgeon: Danie Binder, MD;  Location: [facility];  Service: Endoscopy;  Laterality: N/A;  0830  . CRANIOTOMY  2000   pressure  . INGUINAL HERNIA REPAIR Right   . OLECRANON BURSECTOMY Right 02/11/2015   Procedure: EXCISION RIGHT OLECRANON BURSA;  Surgeon: Sanjuana Kava, MD;  Location: [facility];  Service: Orthopedics;  Laterality: Right;  . repair of trigger finger Left    pinky    Family History  Problem Relation Age of Onset  . Esophageal cancer Father   . Colon cancer Neg Hx     Social History Social History   Tobacco Use  . Smoking status: Current Every Day  Smoker    Packs/day: 0.50    Years: 50.00    Pack years: 25.00    Types: Cigarettes  . Smokeless tobacco: Never Used  . Tobacco comment: 1/2 pk per day  Substance Use Topics  . Alcohol use: Yes    Alcohol/week: 3.0 oz    Types: 5 Cans of beer per week    Comment: 3-4 beers/day  . Drug use: No    Allergies  Allergen Reactions  . Aleve [Naproxen] Other (See Comments)    Numbness in extremities   . Celecoxib Other (See Comments)    Celebrex. Nose bleed.  . Fluoxetine Hcl Swelling    Current Outpatient Medications  Medication Sig Dispense Refill  . ALPRAZolam (XANAX) 0.5 MG tablet Take 0.5 mg by mouth at bedtime as needed for sleep. Reported on 02/19/2016    . amLODipine (NORVASC) 10 MG tablet Take 10 mg by mouth daily.    Marland Kitchen aspirin EC 81 MG tablet Take 81 mg by mouth daily.    . Chlorpheniramine Maleate (ALLERGY PO) Take 1 tablet by mouth daily as needed (allergies). Reported on 02/19/2016    . diclofenac sodium (VOLTAREN) 1 % GEL Apply 2 g topically 4 (four) times daily. 100 g 0  . NORCO 7.5-325 MG tablet Take 1 tablet by mouth every 6 (six) hours as needed for moderate pain (Must last 30 days.Do not drive or operate  machinery while taking this medicine.). 60 tablet 0   No current facility-administered medications for this visit.      Physical Exam   Height 6 foot 7 inches, 216 pounds, afebrile, 135/90, pulse 94.  Constitutional: overall normal hygiene, normal nutrition, well developed, normal grooming, normal body habitus. Assistive device: cervical collar, soft.  Musculoskeletal: gait and station Limp none, muscle tone and strength are normal, no tremors or atrophy is present.  .  Neurological: coordination overall normal.  Deep tendon reflex/nerve stretch intact.  Sensation normal.  Cranial nerves II-XII intact.   Skin:   Normal overall no scars, lesions, ulcers or rashes. No psoriasis.  Psychiatric: Alert and oriented x 3.  Recent memory intact, remote memory  unclear.  Normal mood and affect. Well groomed.  Good eye contact.  Cardiovascular: overall no swelling, no varicosities, no edema bilaterally, normal temperatures of the legs and arms, no clubbing, cyanosis and good capillary refill.  Lymphatic: palpation is normal.  All other systems reviewed and are negative   Examination of left Upper Extremity is done.  Inspection:   Overall:  Elbow non-tender without crepitus or defects, forearm non-tender without crepitus or defects, wrist non-tender without crepitus or defects, hand non-tender.    Shoulder: with glenohumeral joint tenderness, without effusion.   Upper arm: without swelling and tenderness   Range of motion:   Overall:  Full range of motion of the elbow, full range of motion of wrist and full range of motion in fingers.   Shoulder:  left  165 degrees forward flexion; 150 degrees abduction; 35 degrees internal rotation, 35 degrees external rotation, 15 degrees extension, 40 degrees adduction.   Stability:   Overall:  Shoulder, elbow and wrist stable   Strength and Tone:   Overall full shoulder muscles strength, full upper arm strength and normal upper arm bulk and tone.  The patient has been educated about the nature of the problem(s) and counseled on treatment options.  The patient appeared to understand what I have discussed and is in agreement with it.  Encounter Diagnoses  Name Primary?  . Chronic left shoulder pain Yes  . Cigarette nicotine dependence without complication     PLAN Call if any problems.  Precautions discussed.  Continue current medications.   Return to clinic 2 months   I have reviewed the [facility] web site prior to prescribing narcotic medicine for this patient.  Electronically Signed Sanjuana Kava, MD 12/20/20189:13 AM

## 2017-09-08 NOTE — Patient Instructions (Signed)
Steps to Quit Smoking Smoking tobacco can be bad for your health. It can also affect almost every organ in your body. Smoking puts you and people around you at risk for many serious long-lasting (chronic) diseases. Quitting smoking is hard, but it is one of the best things that you can do for your health. It is never too late to quit. What are the benefits of quitting smoking? When you quit smoking, you lower your risk for getting serious diseases and conditions. They can include:  Lung cancer or lung disease.  Heart disease.  Stroke.  Heart attack.  Not being able to have children (infertility).  Weak bones (osteoporosis) and broken bones (fractures).  If you have coughing, wheezing, and shortness of breath, those symptoms may get better when you quit. You may also get sick less often. If you are pregnant, quitting smoking can help to lower your chances of having a baby of low birth weight. What can I do to help me quit smoking? Talk with your doctor about what can help you quit smoking. Some things you can do (strategies) include:  Quitting smoking totally, instead of slowly cutting back how much you smoke over a period of time.  Going to in-person counseling. You are more likely to quit if you go to many counseling sessions.  Using resources and support systems, such as: ? Online chats with a counselor. ? Phone quitlines. ? Printed self-help materials. ? Support groups or group counseling. ? Text messaging programs. ? Mobile phone apps or applications.  Taking medicines. Some of these medicines may have nicotine in them. If you are pregnant or breastfeeding, do not take any medicines to quit smoking unless your doctor says it is okay. Talk with your doctor about counseling or other things that can help you.  Talk with your doctor about using more than one strategy at the same time, such as taking medicines while you are also going to in-person counseling. This can help make  quitting easier. What things can I do to make it easier to quit? Quitting smoking might feel very hard at first, but there is a lot that you can do to make it easier. Take these steps:  Talk to your family and friends. Ask them to support and encourage you.  Call phone quitlines, reach out to support groups, or work with a counselor.  Ask people who smoke to not smoke around you.  Avoid places that make you want (trigger) to smoke, such as: ? Bars. ? Parties. ? Smoke-break areas at work.  Spend time with people who do not smoke.  Lower the stress in your life. Stress can make you want to smoke. Try these things to help your stress: ? Getting regular exercise. ? Deep-breathing exercises. ? Yoga. ? Meditating. ? Doing a body scan. To do this, close your eyes, focus on one area of your body at a time from head to toe, and notice which parts of your body are tense. Try to relax the muscles in those areas.  Download or buy apps on your mobile phone or tablet that can help you stick to your quit plan. There are many free apps, such as QuitGuide from the CDC (Centers for Disease Control and Prevention). You can find more support from smokefree.gov and other websites.  This information is not intended to replace advice given to you by your health care provider. Make sure you discuss any questions you have with your health care provider. Document Released: 07/03/2009 Document   Revised: 05/04/2016 Document Reviewed: 01/21/2015 Elsevier Interactive Patient Education  2018 Elsevier Inc.  

## 2017-10-10 ENCOUNTER — Telehealth: Payer: Self-pay | Admitting: Orthopaedic Surgery

## 2017-10-10 NOTE — Telephone Encounter (Signed)
Patient requests refill on Hydrocodone/Acetaminophen 7.5-325  Mgs.   Qty 60  Sig: Take 1 tablet by mouth every 6 (six) hours as needed for moderate pain (Must last 30 days.Do not drive or operate machinery while taking this medicine.).  Patient uses [employer]

## 2017-10-12 MED ORDER — NORCO 7.5-325 MG PO TABS: 1.0000 | ORAL_TABLET | Freq: Four times a day (QID) | ORAL | 0 refills | Status: DC | PRN

## 2017-10-13 ENCOUNTER — Encounter (HOSPITAL_COMMUNITY): Payer: PPO

## 2017-10-13 ENCOUNTER — Ambulatory Visit: Payer: PPO | Admitting: Family

## 2017-11-04 ENCOUNTER — Other Ambulatory Visit (HOSPITAL_COMMUNITY): Payer: Self-pay | Admitting: Internal Medicine

## 2017-11-04 DIAGNOSIS — R160 Hepatomegaly, not elsewhere classified: Secondary | ICD-10-CM

## 2017-11-04 DIAGNOSIS — I10 Essential (primary) hypertension: Secondary | ICD-10-CM | POA: Diagnosis not present

## 2017-11-04 DIAGNOSIS — F17210 Nicotine dependence, cigarettes, uncomplicated: Secondary | ICD-10-CM | POA: Diagnosis not present

## 2017-11-04 DIAGNOSIS — I712 Thoracic aortic aneurysm, without rupture, unspecified: Secondary | ICD-10-CM

## 2017-11-04 DIAGNOSIS — Z1389 Encounter for screening for other disorder: Secondary | ICD-10-CM | POA: Diagnosis not present

## 2017-11-04 DIAGNOSIS — Z1331 Encounter for screening for depression: Secondary | ICD-10-CM | POA: Diagnosis not present

## 2017-11-04 DIAGNOSIS — Z0001 Encounter for general adult medical examination with abnormal findings: Secondary | ICD-10-CM | POA: Diagnosis not present

## 2017-11-04 DIAGNOSIS — K769 Liver disease, unspecified: Secondary | ICD-10-CM | POA: Diagnosis not present

## 2017-11-07 DIAGNOSIS — M79675 Pain in left toe(s): Secondary | ICD-10-CM | POA: Diagnosis not present

## 2017-11-07 DIAGNOSIS — M79674 Pain in right toe(s): Secondary | ICD-10-CM | POA: Diagnosis not present

## 2017-11-07 DIAGNOSIS — B351 Tinea unguium: Secondary | ICD-10-CM | POA: Diagnosis not present

## 2017-11-07 DIAGNOSIS — I739 Peripheral vascular disease, unspecified: Secondary | ICD-10-CM | POA: Diagnosis not present

## 2017-11-07 DIAGNOSIS — L851 Acquired keratosis [keratoderma] palmaris et plantaris: Secondary | ICD-10-CM | POA: Diagnosis not present

## 2017-11-10 ENCOUNTER — Ambulatory Visit: Payer: PPO | Admitting: Orthopaedic Surgery

## 2017-11-10 ENCOUNTER — Other Ambulatory Visit (HOSPITAL_COMMUNITY): Payer: Self-pay | Admitting: Internal Medicine

## 2017-11-10 ENCOUNTER — Encounter: Payer: Self-pay | Admitting: Orthopaedic Surgery

## 2017-11-10 VITALS — BP 153/83 | HR 74 | Ht 79.0 in | Wt 218.0 lb

## 2017-11-10 DIAGNOSIS — F17210 Nicotine dependence, cigarettes, uncomplicated: Secondary | ICD-10-CM | POA: Diagnosis not present

## 2017-11-10 DIAGNOSIS — G8929 Other chronic pain: Secondary | ICD-10-CM | POA: Diagnosis not present

## 2017-11-10 DIAGNOSIS — M25512 Pain in left shoulder: Secondary | ICD-10-CM

## 2017-11-10 DIAGNOSIS — R160 Hepatomegaly, not elsewhere classified: Secondary | ICD-10-CM

## 2017-11-10 MED ORDER — NORCO 7.5-325 MG PO TABS: 1.0000 | ORAL_TABLET | Freq: Four times a day (QID) | ORAL | 0 refills | Status: DC | PRN

## 2017-11-10 NOTE — Progress Notes (Signed)
[name], male DOB:1946/07/19, 72 y.o. ONG:[id number]  Chief Complaint  Patient presents with  . Shoulder Pain    left    HPI  Larry Reese is a 72 y.o. male who has left shoulder pain chronic.  He has more pain with overhead use and with extension.  He has no new trauma, no paresthesias.  The cold rainy weather has made his pain worse.  He is taking his medicine. HPI  Body mass index is 24.56 kg/m.  ROS  Review of Systems  HENT: Negative for congestion.   Respiratory: Positive for cough. Negative for shortness of breath.   Cardiovascular: Negative for chest pain.  Endocrine: Positive for cold intolerance.  Musculoskeletal: Positive for arthralgias and myalgias.  Allergic/Immunologic: Negative for environmental allergies.  All other systems reviewed and are negative.   Past Medical History:  Diagnosis Date  . Arthritis   . Hx of adenomatous colonic polyps   . Hypertension   . Pneumothorax   . Stroke [facility]) 2005   left sided weakness  . Trigger finger     Past Surgical History:  Procedure Laterality Date  . COLONOSCOPY  May 2010   Dr. fields: 5 cm terminal ileum normal, 6 polyps removed, moderate internal hemorrhoids, simple adenomas, next colonoscopy May 2015  . COLONOSCOPY N/A 08/01/2015   Procedure: COLONOSCOPY;  Surgeon: Danie Binder, MD;  Location: [facility];  Service: Endoscopy;  Laterality: N/A;  0830  . CRANIOTOMY  2000   pressure  . INGUINAL HERNIA REPAIR Right   . OLECRANON BURSECTOMY Right 02/11/2015   Procedure: EXCISION RIGHT OLECRANON BURSA;  Surgeon: Sanjuana Kava, MD;  Location: [facility];  Service: Orthopedics;  Laterality: Right;  . repair of trigger finger Left    pinky    Family History  Problem Relation Age of Onset  . Esophageal cancer Father   . Colon cancer Neg Hx     Social History Social History   Tobacco Use  . Smoking status: Current Every Day Smoker    Packs/day: 0.50    Years: 50.00    Pack years:  25.00    Types: Cigarettes  . Smokeless tobacco: Never Used  . Tobacco comment: 1/2 pk per day  Substance Use Topics  . Alcohol use: Yes    Alcohol/week: 3.0 oz    Types: 5 Cans of beer per week    Comment: 3-4 beers/day  . Drug use: No    Allergies  Allergen Reactions  . Aleve [Naproxen] Other (See Comments)    Numbness in extremities   . Celecoxib Other (See Comments)    Celebrex. Nose bleed.  . Fluoxetine Hcl Swelling    Current Outpatient Medications  Medication Sig Dispense Refill  . ALPRAZolam (XANAX) 0.5 MG tablet Take 0.5 mg by mouth at bedtime as needed for sleep. Reported on 02/19/2016    . amLODipine (NORVASC) 10 MG tablet Take 10 mg by mouth daily.    Marland Kitchen aspirin EC 81 MG tablet Take 81 mg by mouth daily.    . Chlorpheniramine Maleate (ALLERGY PO) Take 1 tablet by mouth daily as needed (allergies). Reported on 02/19/2016    . diclofenac sodium (VOLTAREN) 1 % GEL Apply 2 g topically 4 (four) times daily. 100 g 0  . NORCO 7.5-325 MG tablet Take 1 tablet by mouth every 6 (six) hours as needed for moderate pain (Must last 30 days). 60 tablet 0   No current facility-administered medications for this visit.  Physical Exam  Blood pressure (!) 153/83, pulse 74, height 6\' 7"  (2.007 m), weight 218 lb (98.9 kg).  Constitutional: overall normal hygiene, normal nutrition, well developed, normal grooming, normal body habitus. Assistive device:none  Musculoskeletal: gait and station Limp none, muscle tone and strength are normal, no tremors or atrophy is present.  .  Neurological: coordination overall normal.  Deep tendon reflex/nerve stretch intact.  Sensation normal.  Cranial nerves II-XII intact.   Skin:   Normal overall no scars, lesions, ulcers or rashes. No psoriasis.  Psychiatric: Alert and oriented x 3.  Recent memory intact, remote memory unclear.  Normal mood and affect. Well groomed.  Good eye contact.  Cardiovascular: overall no swelling, no varicosities, no  edema bilaterally, normal temperatures of the legs and arms, no clubbing, cyanosis and good capillary refill.  Lymphatic: palpation is normal.  Examination of left Upper Extremity is done.  Inspection:   Overall:  Elbow non-tender without crepitus or defects, forearm non-tender without crepitus or defects, wrist non-tender without crepitus or defects, hand non-tender.    Shoulder: with glenohumeral joint tenderness, without effusion.   Upper arm: without swelling and tenderness   Range of motion:   Overall:  Full range of motion of the elbow, full range of motion of wrist and full range of motion in fingers.   Shoulder:  left  165 degrees forward flexion; 150 degrees abduction; 35 degrees internal rotation, 35 degrees external rotation, 15 degrees extension, 40 degrees adduction.   Stability:   Overall:  Shoulder, elbow and wrist stable   Strength and Tone:   Overall full shoulder muscles strength, full upper arm strength and normal upper arm bulk and tone.  All other systems reviewed and are negative   The patient has been educated about the nature of the problem(s) and counseled on treatment options.  The patient appeared to understand what I have discussed and is in agreement with it.  Encounter Diagnoses  Name Primary?  . Chronic left shoulder pain Yes  . Cigarette nicotine dependence without complication     PLAN Call if any problems.  Precautions discussed.  Continue current medications.   Return to clinic 3 months   I have reviewed the [facility] web site prior to prescribing narcotic medicine for this patient.  Electronically Signed Sanjuana Kava, MD 2/21/20199:45 AM

## 2017-11-22 ENCOUNTER — Ambulatory Visit (INDEPENDENT_AMBULATORY_CARE_PROVIDER_SITE_OTHER): Payer: PPO | Admitting: General Surgery

## 2017-11-22 ENCOUNTER — Encounter: Payer: Self-pay | Admitting: General Surgery

## 2017-11-22 VITALS — BP 140/91 | HR 78 | Temp 98.2°F | Ht 79.0 in | Wt 213.0 lb

## 2017-11-22 DIAGNOSIS — K4090 Unilateral inguinal hernia, without obstruction or gangrene, not specified as recurrent: Secondary | ICD-10-CM

## 2017-11-22 NOTE — Patient Instructions (Signed)

## 2017-11-22 NOTE — Progress Notes (Signed)
Larry Reese; [id number]; Feb 18, 1946   HPI Patient is a 72 year old black male who referred himself to my care for evaluation and treatment of a left inguinal hernia.  He states it has been present for many years.  He has noticed some increased in size recently, but it does not cause him any significant discomfort.  It is not affecting his daily lifestyle.  He has 0 out of 10 pain.  He is status post right inguinal herniorrhaphy in the remote past. Past Medical History:  Diagnosis Date  . Arthritis   . Hx of adenomatous colonic polyps   . Hypertension   . Pneumothorax   . Stroke [facility]) 2005   left sided weakness  . Trigger finger     Past Surgical History:  Procedure Laterality Date  . COLONOSCOPY  May 2010   Dr. fields: 5 cm terminal ileum normal, 6 polyps removed, moderate internal hemorrhoids, simple adenomas, next colonoscopy May 2015  . COLONOSCOPY N/A 08/01/2015   Procedure: COLONOSCOPY;  Surgeon: Danie Binder, MD;  [facility];  Service: Endoscopy;  Laterality: N/A;  0830  . CRANIOTOMY  2000   pressure  . INGUINAL HERNIA REPAIR Right   . OLECRANON BURSECTOMY Right 02/11/2015   Procedure: EXCISION RIGHT OLECRANON BURSA;  Surgeon: Sanjuana Kava, MD;  Location: [facility];  Service: Orthopedics;  Laterality: Right;  . repair of trigger finger Left    pinky    Family History  Problem Relation Age of Onset  . Esophageal cancer Father   . Colon cancer Neg Hx     Current Outpatient Medications on File Prior to Visit  Medication Sig Dispense Refill  . ALPRAZolam (XANAX) 0.5 MG tablet Take 0.5 mg by mouth at bedtime as needed for sleep. Reported on 02/19/2016    . amLODipine (NORVASC) 10 MG tablet Take 10 mg by mouth daily.    Marland Kitchen aspirin EC 81 MG tablet Take 81 mg by mouth daily.    . Chlorpheniramine Maleate (ALLERGY PO) Take 1 tablet by mouth daily as needed (allergies). Reported on 02/19/2016    . diclofenac sodium (VOLTAREN) 1 % GEL Apply 2 g topically 4 (four)  times daily. 100 g 0  . NORCO 7.5-325 MG tablet Take 1 tablet by mouth every 6 (six) hours as needed for moderate pain (Must last 30 days). 60 tablet 0   No current facility-administered medications on file prior to visit.     Allergies  Allergen Reactions  . Aleve [Naproxen] Other (See Comments)    Numbness in extremities   . Celecoxib Other (See Comments)    Celebrex. Nose bleed.  . Fluoxetine Hcl Swelling    Social History   Substance and Sexual Activity  Alcohol Use Yes  . Alcohol/week: 3.0 oz  . Types: 5 Cans of beer per week   Comment: 3-4 beers/day    Social History   Tobacco Use  Smoking Status Current Every Day Smoker  . Packs/day: 0.50  . Years: 50.00  . Pack years: 25.00  . Types: Cigarettes  Smokeless Tobacco Never Used  Tobacco Comment   1/2 pk per day    Review of Systems  Constitutional: Negative.   HENT: Negative.   Eyes: Negative.   Respiratory: Negative.   Cardiovascular: Negative.   Gastrointestinal: Negative.   Genitourinary: Positive for frequency.  Musculoskeletal: Positive for neck pain.  Skin: Negative.   Neurological: Negative.   Endo/Heme/Allergies: Negative.   Psychiatric/Behavioral: Negative.     Objective   Vitals:  11/22/17 1015  BP: (!) 140/91  Pulse: 78  Temp: 98.2 F (36.8 C)    Physical Exam  Constitutional: He is oriented to person, place, and time and well-developed, well-nourished, and in no distress.  HENT:  Head: Normocephalic and atraumatic.  Cardiovascular: Normal rate, regular rhythm and normal heart sounds. Exam reveals no gallop and no friction rub.  No murmur heard. Pulmonary/Chest: Effort normal and breath sounds normal. No respiratory distress. He has no wheezes. He has no rales.  Abdominal: Soft. Bowel sounds are normal. He exhibits no distension. There is no tenderness. There is no rebound.  Easily reducible left inguinal hernia.  Surgical scar well-healed in the right groin region.  No hernia  appreciated on the right side.  Genitourinary:  Genitourinary Comments: No hydroceles present.  Neurological: He is alert and oriented to person, place, and time.  Skin: Skin is warm and dry.  Vitals reviewed.   Assessment  Left inguinal hernia, currently asymptomatic Plan   No need for acute surgical intervention at this time.  The risks and signs of incarceration were fully explained to the patient.  He will return should he become more symptomatic.  Follow-up here as needed.

## 2017-11-29 ENCOUNTER — Ambulatory Visit (HOSPITAL_COMMUNITY)
Admission: RE | Admit: 2017-11-29 | Discharge: 2017-11-29 | Disposition: A | Discharge status: Home / Self Care | Payer: PPO | Source: Ambulatory Visit | Attending: Internal Medicine | Admitting: Internal Medicine

## 2017-11-29 DIAGNOSIS — R160 Hepatomegaly, not elsewhere classified: Secondary | ICD-10-CM | POA: Diagnosis present

## 2017-11-29 DIAGNOSIS — K7689 Other specified diseases of liver: Secondary | ICD-10-CM | POA: Diagnosis not present

## 2017-11-29 DIAGNOSIS — I700 Atherosclerosis of aorta: Secondary | ICD-10-CM | POA: Insufficient documentation

## 2017-11-29 DIAGNOSIS — I2510 Atherosclerotic heart disease of native coronary artery without angina pectoris: Secondary | ICD-10-CM | POA: Insufficient documentation

## 2017-11-29 DIAGNOSIS — K429 Umbilical hernia without obstruction or gangrene: Secondary | ICD-10-CM | POA: Diagnosis not present

## 2017-11-29 DIAGNOSIS — N2889 Other specified disorders of kidney and ureter: Secondary | ICD-10-CM | POA: Insufficient documentation

## 2017-11-29 DIAGNOSIS — N200 Calculus of kidney: Secondary | ICD-10-CM | POA: Diagnosis not present

## 2017-11-29 MED ORDER — IOPAMIDOL (ISOVUE-300) INJECTION 61%
100.0000 mL | Freq: Once | INTRAVENOUS | Status: AC | PRN
  Administered 2017-11-29: 100 mL via INTRAVENOUS

## 2017-12-02 ENCOUNTER — Ambulatory Visit (HOSPITAL_COMMUNITY)
Admission: RE | Admit: 2017-12-02 | Discharge: 2017-12-02 | Disposition: A | Discharge status: Home / Self Care | Payer: PPO | Source: Ambulatory Visit | Attending: Vascular Surgery | Admitting: Vascular Surgery

## 2017-12-02 ENCOUNTER — Ambulatory Visit (INDEPENDENT_AMBULATORY_CARE_PROVIDER_SITE_OTHER): Payer: PPO | Admitting: Family

## 2017-12-02 ENCOUNTER — Encounter: Payer: Self-pay | Admitting: Family

## 2017-12-02 ENCOUNTER — Other Ambulatory Visit: Payer: Self-pay

## 2017-12-02 VITALS — BP 134/87 | HR 73 | Temp 98.8°F | Resp 16 | Ht 79.0 in | Wt 217.0 lb

## 2017-12-02 DIAGNOSIS — I739 Peripheral vascular disease, unspecified: Secondary | ICD-10-CM

## 2017-12-02 DIAGNOSIS — R9439 Abnormal result of other cardiovascular function study: Secondary | ICD-10-CM | POA: Insufficient documentation

## 2017-12-02 NOTE — Patient Instructions (Addendum)
Steps to Quit Smoking Smoking tobacco can be bad for your health. It can also affect almost every organ in your body. Smoking puts you and people around you at risk for many serious long-lasting (chronic) diseases. Quitting smoking is hard, but it is one of the best things that you can do for your health. It is never too late to quit. What are the benefits of quitting smoking? When you quit smoking, you lower your risk for getting serious diseases and conditions. They can include:  Lung cancer or lung disease.  Heart disease.  Stroke.  Heart attack.  Not being able to have children (infertility).  Weak bones (osteoporosis) and broken bones (fractures).  If you have coughing, wheezing, and shortness of breath, those symptoms may get better when you quit. You may also get sick less often. If you are pregnant, quitting smoking can help to lower your chances of having a baby of low birth weight. What can I do to help me quit smoking? Talk with your doctor about what can help you quit smoking. Some things you can do (strategies) include:  Quitting smoking totally, instead of slowly cutting back how much you smoke over a period of time.  Going to in-person counseling. You are more likely to quit if you go to many counseling sessions.  Using resources and support systems, such as: ? Online chats with a counselor. ? Phone quitlines. ? Printed self-help materials. ? Support groups or group counseling. ? Text messaging programs. ? Mobile phone apps or applications.  Taking medicines. Some of these medicines may have nicotine in them. If you are pregnant or breastfeeding, do not take any medicines to quit smoking unless your doctor says it is okay. Talk with your doctor about counseling or other things that can help you.  Talk with your doctor about using more than one strategy at the same time, such as taking medicines while you are also going to in-person counseling. This can help make  quitting easier. What things can I do to make it easier to quit? Quitting smoking might feel very hard at first, but there is a lot that you can do to make it easier. Take these steps:  Talk to your family and friends. Ask them to support and encourage you.  Call phone quitlines, reach out to support groups, or work with a counselor.  Ask people who smoke to not smoke around you.  Avoid places that make you want (trigger) to smoke, such as: ? Bars. ? Parties. ? Smoke-break areas at work.  Spend time with people who do not smoke.  Lower the stress in your life. Stress can make you want to smoke. Try these things to help your stress: ? Getting regular exercise. ? Deep-breathing exercises. ? Yoga. ? Meditating. ? Doing a body scan. To do this, close your eyes, focus on one area of your body at a time from head to toe, and notice which parts of your body are tense. Try to relax the muscles in those areas.  Download or buy apps on your mobile phone or tablet that can help you stick to your quit plan. There are many free apps, such as QuitGuide from the CDC (Centers for Disease Control and Prevention). You can find more support from smokefree.gov and other websites.  This information is not intended to replace advice given to you by your health care provider. Make sure you discuss any questions you have with your health care provider. Document Released: 07/03/2009 Document   Revised: 05/04/2016 Document Reviewed: 01/21/2015 Elsevier Interactive Patient Education  2018 Elsevier Inc.     Peripheral Vascular Disease Peripheral vascular disease (PVD) is a disease of the blood vessels that are not part of your heart and brain. A simple term for PVD is poor circulation. In most cases, PVD narrows the blood vessels that carry blood from your heart to the rest of your body. This can result in a decreased supply of blood to your arms, legs, and internal organs, like your stomach or kidneys.  However, it most often affects a person's lower legs and feet. There are two types of PVD.  Organic PVD. This is the more common type. It is caused by damage to the structure of blood vessels.  Functional PVD. This is caused by conditions that make blood vessels contract and tighten (spasm).  Without treatment, PVD tends to get worse over time. PVD can also lead to acute ischemic limb. This is when an arm or limb suddenly has trouble getting enough blood. This is a medical emergency. Follow these instructions at home:  Take medicines only as told by your doctor.  Do not use any tobacco products, including cigarettes, chewing tobacco, or electronic cigarettes. If you need help quitting, ask your doctor.  Lose weight if you are overweight, and maintain a healthy weight as told by your doctor.  Eat a diet that is low in fat and cholesterol. If you need help, ask your doctor.  Exercise regularly. Ask your doctor for some good activities for you.  Take good care of your feet. ? Wear comfortable shoes that fit well. ? Check your feet often for any cuts or sores. Contact a doctor if:  You have cramps in your legs while walking.  You have leg pain when you are at rest.  You have coldness in a leg or foot.  Your skin changes.  You are unable to get or have an erection (erectile dysfunction).  You have cuts or sores on your feet that are not healing. Get help right away if:  Your arm or leg turns cold and blue.  Your arms or legs become red, warm, swollen, painful, or numb.  You have chest pain or trouble breathing.  You suddenly have weakness in your face, arm, or leg.  You become very confused or you cannot speak.  You suddenly have a very bad headache.  You suddenly cannot see. This information is not intended to replace advice given to you by your health care provider. Make sure you discuss any questions you have with your health care provider. Document Released:  12/01/2009 Document Revised: 02/12/2016 Document Reviewed: 02/14/2014 Elsevier Interactive Patient Education  2017 Elsevier Inc.  

## 2017-12-02 NOTE — Progress Notes (Signed)
[facility] [place]   CC: Follow up peripheral artery occlusive disease  History of Present Illness Larry Reese is a 72 y.o. male with several year history of right leg pain. This initially started in 2016.   The patient had pain in his right groin and anterior thigh. This improved a little bit but he still has pain primarily in his right thigh at his July 2018 visit. He also had some weakness and pain that occurs in his right lower extremity. He denies calf pain with walking. But states the leg does begin to hurt after walking one block. He denies rest pain. He has no nonhealing wounds. He denies family history of abdominal aortic aneurysm. He does smoke a half pack of cigarettes per day.   Dr. Oneida Alar last evaluated pt on 03-31-17. At that time he did not believe that the right thigh pain or groin pain was related to PAD.  However some of the weakness symptoms that he has in his right leg may be related. However, he does not really have any symptoms in the left leg which has similar perfusion. The patient has some element of peripheral arterial disease. He does not really feel debilitated by this.Dr. Oneida Alar indicated that the the best option initially would be conservative management with smoking cessation walking program of 30 minutes daily. The patient was to return for follow-up with repeat ABIs in 6 months time. If his symptoms become worse over time we would consider an intervention at that point.  Presently he mostly is concerned about right hip pain that is present sitting, supine, or walking, does not indicate thigh or calf issues.    Diabetic: No Tobacco use: smoker  (4-5 cigarettes/day, was 1 ppd, started in his teens)  Pt meds include: Statin :No ASA: No, no, he just stopped, no bruising or bleed issues Other anticoagulants/antiplatelets: no  Past Medical History:  Diagnosis Date  . Arthritis   . Hx of adenomatous colonic polyps   . Hypertension   .  Pneumothorax   . Stroke [facility]) 2005   left sided weakness  . Trigger finger     Social History Social History   Tobacco Use  . Smoking status: Current Every Day Smoker    Packs/day: 0.50    Years: 50.00    Pack years: 25.00    Types: Cigarettes  . Smokeless tobacco: Never Used  . Tobacco comment: 5 cigarettes per day  Substance Use Topics  . Alcohol use: Yes    Alcohol/week: 3.0 oz    Types: 5 Cans of beer per week    Comment: 3-4 beers/day  . Drug use: No    Family History Family History  Problem Relation Age of Onset  . Esophageal cancer Father   . Colon cancer Neg Hx     Past Surgical History:  Procedure Laterality Date  . COLONOSCOPY  May 2010   Dr. fields: 5 cm terminal ileum normal, 6 polyps removed, moderate internal hemorrhoids, simple adenomas, next colonoscopy May 2015  . COLONOSCOPY N/A 08/01/2015   Procedure: COLONOSCOPY;  Surgeon: Danie Binder, MD;  Location: [facility];  Service: Endoscopy;  Laterality: N/A;  0830  . CRANIOTOMY  2000   pressure  . INGUINAL HERNIA REPAIR Right   . OLECRANON BURSECTOMY Right 02/11/2015   Procedure: EXCISION RIGHT OLECRANON BURSA;  Surgeon: Sanjuana Kava, MD;  Location: [facility];  Service: Orthopedics;  Laterality: Right;  . repair of trigger finger Left  pinky    Allergies  Allergen Reactions  . Aleve [Naproxen] Other (See Comments)    Numbness in extremities   . Celecoxib Other (See Comments)    Celebrex. Nose bleed.  . Fluoxetine Hcl Swelling    Current Outpatient Medications  Medication Sig Dispense Refill  . ALPRAZolam (XANAX) 0.5 MG tablet Take 0.5 mg by mouth at bedtime as needed for sleep. Reported on 02/19/2016    . amLODipine (NORVASC) 10 MG tablet Take 10 mg by mouth daily.    Marland Kitchen aspirin EC 81 MG tablet Take 81 mg by mouth daily.    . Chlorpheniramine Maleate (ALLERGY PO) Take 1 tablet by mouth daily as needed (allergies). Reported on 02/19/2016    . diclofenac sodium (VOLTAREN) 1 % GEL Apply 2 g  topically 4 (four) times daily. 100 g 0  . NORCO 7.5-325 MG tablet Take 1 tablet by mouth every 6 (six) hours as needed for moderate pain (Must last 30 days). 60 tablet 0   No current facility-administered medications for this visit.     ROS: See HPI for pertinent positives and negatives.   Physical Examination  Vitals:   12/02/17 1410 12/02/17 1420  BP: 129/84 134/87  Pulse: 73   Resp: 16   Temp: 98.8 F (37.1 C)   TempSrc: Oral   SpO2: 98%   Weight: 217 lb (98.4 kg)   Height: 6\' 7"  (2.007 m)    Body mass index is 24.45 kg/m.  General: A&O x 3, WDWN, male. Gait: normal HENT: No gross abnormalities.  Eyes: PERRLA. Pulmonary: Respirations are non labored, CTAB, good air movement Cardiac: regular rhythm, no detected murmur.         Carotid Bruits Right Left   Negative Negative   Radial pulses are 2+ palpable bilaterally   Adominal aortic pulse is not palpable                         VASCULAR EXAM: Extremities without ischemic changes, without Gangrene; without open wounds.                                                                                                          LE Pulses Right Left       FEMORAL  2+ palpable  2+ palpable        POPLITEAL  not palpable   not palpable       POSTERIOR TIBIAL  not palpable   not palpable        DORSALIS PEDIS      ANTERIOR TIBIAL not palpable  not palpable    Abdomen: soft, NT, no palpable masses. Skin: no rashes, no cellulitis, no ulcers noted. Musculoskeletal: no muscle wasting or atrophy.  Neurologic: A&O X 3; appropriate affect, Sensation is normal; MOTOR FUNCTION:  moving all extremities equally, motor strength 5/5 throughout. Speech is fluent/normal. CN 2-12 intact. Psychiatric: Thought content is normal, mood appropriate for clinical situation.     ASSESSMENT: Larry Reese is a 72 y.o. male with moderate bilateral lower  extremity arterial occlusive disease. Bilateral lower extremity duplex on  03-31-17 demonstrated no significant focal stenosis.   Bilateral femoral pulses are 2+ palpable. It is unlikely that his right hip pain is from lack of arterial perfusion.  Consider OA of the right hip or lumbar spine issues, defer to his PCP.    DATA  ABI (Date: 12/02/2017):  R:   ABI: 0.71 (was 0.70 on 03-31-17),   PT: mono  DP: mono  TBI:  0.67  L:   ABI: 0.76 (was 0.71),   PT: waveform morphology not documented   DP: waveform morphology not documented    TBI: 0.50  Stable bilateral ABI with moderate disease.    PLAN:  I advised him to resume the daily 81 mg ASA that he was taking since he had no adverse reaction, he stopped taking when he ran out.    Graduated walking program discussed and how to achieve.   Based on the patient's vascular studies and examination, pt will return to clinic in 6 months with ABI's.  I advised him and his wife to notify us if he develops concerns re the circulation in his feet or legs.   The patient was counseled re smoking cessation and given several free resources re smoking cessation.  I discussed in depth with the patient the nature of atherosclerosis, and emphasized the importance of maximal medical management including strict control of blood pressure, blood glucose, and lipid levels, obtaining regular exercise, and cessation of smoking.  The patient is aware that without maximal medical management the underlying atherosclerotic disease process will progress, limiting the benefit of any interventions.  The patient was given information about PAD including signs, symptoms, treatment, what symptoms should prompt the patient to seek immediate medical care, and risk reduction measures to take.  Clemon Chambers, RN, MSN, FNP-C [facility] Phone: [phone]  Clinic MD: [facility]  12/02/17 3:04 PM

## 2017-12-07 ENCOUNTER — Other Ambulatory Visit: Payer: Self-pay

## 2017-12-07 DIAGNOSIS — I739 Peripheral vascular disease, unspecified: Secondary | ICD-10-CM

## 2017-12-08 ENCOUNTER — Other Ambulatory Visit: Payer: Self-pay | Admitting: Orthopaedic Surgery

## 2017-12-08 MED ORDER — NORCO 7.5-325 MG PO TABS: 1.0000 | ORAL_TABLET | Freq: Four times a day (QID) | ORAL | 0 refills | Status: DC | PRN

## 2017-12-08 NOTE — Telephone Encounter (Signed)
Patient of Dr. Brooke Bonito requests refill on Norco 7.5-325  Mgs.   Qty  60       Sig: Take 1 tablet by mouth every 6 (six) hours as needed for moderate pain (Must last 30 days).     Patient states he uses [facility]

## 2017-12-09 ENCOUNTER — Other Ambulatory Visit: Payer: Self-pay | Admitting: Orthopaedic Surgery

## 2017-12-09 ENCOUNTER — Other Ambulatory Visit: Payer: Self-pay | Admitting: Orthopedic Surgery

## 2017-12-09 MED ORDER — HYDROCODONE-ACETAMINOPHEN 7.5-325 MG PO TABS: 1.0000 | ORAL_TABLET | Freq: Four times a day (QID) | ORAL | 0 refills | Status: DC | PRN

## 2017-12-09 NOTE — Telephone Encounter (Signed)
Called again they could not change to generic you will have to resend the Rx pended reorder

## 2017-12-09 NOTE — Telephone Encounter (Signed)
[place]

## 2017-12-09 NOTE — Telephone Encounter (Signed)
Called to advise generic okay, but line busy

## 2017-12-09 NOTE — Telephone Encounter (Signed)
Charles from [facility] called stating that the prescription for Norco 7.5/325mg  has to be changed to the generic brand in order for the patient's insurance to pay for it.  The prescription was e-scribed on 12/08/17 to them.

## 2017-12-15 ENCOUNTER — Telehealth: Payer: Self-pay | Admitting: Orthopaedic Surgery

## 2017-12-15 NOTE — Telephone Encounter (Signed)
Hydrocodone-Acetaminophen 7.5/325 mg  Qty 28 Tablets  Take 1 tablet by mouth every 6 (six) hours as needed for moderate pain.  PATIENT USES [facility]

## 2017-12-19 ENCOUNTER — Other Ambulatory Visit: Payer: Self-pay | Admitting: Orthopaedic Surgery

## 2017-12-19 MED ORDER — HYDROCODONE-ACETAMINOPHEN 7.5-325 MG PO TABS: 1.0000 | ORAL_TABLET | Freq: Four times a day (QID) | ORAL | 0 refills | Status: DC | PRN

## 2017-12-19 NOTE — Telephone Encounter (Signed)
Patient of Dr. Brooke Bonito requests refill on Hydrocodone/Acetaminophen 7.5-325  Mgs.  Qty  28  Sig: Take 1 tablet by mouth every 6 (six) hours as needed for moderate pain.  Patient states he uses [facility]

## 2017-12-29 ENCOUNTER — Other Ambulatory Visit: Payer: Self-pay | Admitting: Orthopaedic Surgery

## 2017-12-29 MED ORDER — HYDROCODONE-ACETAMINOPHEN 7.5-325 MG PO TABS: 1.0000 | ORAL_TABLET | Freq: Four times a day (QID) | ORAL | 0 refills | Status: DC | PRN

## 2017-12-29 NOTE — Telephone Encounter (Signed)
Hydrocodone-Acetaminophen  7.5/325 mg  Qty 28 Tablets  Take 1 tablet by mouth every 6 (six) hours as needed for moderate pain.  PATIENT USES [facility]

## 2018-01-05 ENCOUNTER — Other Ambulatory Visit: Payer: Self-pay | Admitting: Orthopaedic Surgery

## 2018-01-05 MED ORDER — HYDROCODONE-ACETAMINOPHEN 7.5-325 MG PO TABS: 1.0000 | ORAL_TABLET | Freq: Four times a day (QID) | ORAL | 0 refills | Status: DC | PRN

## 2018-01-05 NOTE — Telephone Encounter (Signed)
Hydrocodone-Acetaminophen 7.5/325 mg  Qty 28 Tablets  Take 1 tablet by mouth every 6 (six) hours as needed for moderate pain.  PATIENT USES [facility]

## 2018-01-12 ENCOUNTER — Other Ambulatory Visit: Payer: Self-pay | Admitting: Orthopaedic Surgery

## 2018-01-12 NOTE — Telephone Encounter (Signed)
Hydrocodone-Acetaminophen 7.5/325 mg  Qty 28 Tablets  Take 1 tablet by mouth every 6 (six) hours as needed for moderate pain.  PATIENT USES [facility]  Patient is asking for a two week supply due to him being out of town for the next two weeks.  Patient called in before lunch for his request

## 2018-01-13 MED ORDER — HYDROCODONE-ACETAMINOPHEN 7.5-325 MG PO TABS: 1.0000 | ORAL_TABLET | Freq: Four times a day (QID) | ORAL | 0 refills | Status: DC | PRN

## 2018-01-20 ENCOUNTER — Other Ambulatory Visit: Payer: Self-pay | Admitting: Orthopaedic Surgery

## 2018-01-20 NOTE — Telephone Encounter (Signed)
Patient is aware it may be Monday before this is done.  Hydrocodone-Acetaminophen  7.5/325 mg  Qty 28 Tablets Take 1 tablet by mouth every 6(six) hours as needed for moderate pain.  PATIENT USES [facility]

## 2018-01-23 MED ORDER — HYDROCODONE-ACETAMINOPHEN 7.5-325 MG PO TABS: 1.0000 | ORAL_TABLET | Freq: Four times a day (QID) | ORAL | 0 refills | Status: DC | PRN

## 2018-02-02 ENCOUNTER — Telehealth: Payer: Self-pay | Admitting: Orthopaedic Surgery

## 2018-02-02 MED ORDER — HYDROCODONE-ACETAMINOPHEN 7.5-325 MG PO TABS: 1.0000 | ORAL_TABLET | Freq: Four times a day (QID) | ORAL | 0 refills | Status: DC | PRN

## 2018-02-02 NOTE — Telephone Encounter (Signed)
Hydrocodone-Acetaminophen  7.5/325 mg  Qty 28 Tablet  PATIENT USES [facility]

## 2018-02-06 ENCOUNTER — Other Ambulatory Visit (HOSPITAL_COMMUNITY): Payer: Self-pay | Admitting: Internal Medicine

## 2018-02-06 DIAGNOSIS — D49512 Neoplasm of unspecified behavior of left kidney: Secondary | ICD-10-CM | POA: Diagnosis not present

## 2018-02-06 DIAGNOSIS — I712 Thoracic aortic aneurysm, without rupture: Secondary | ICD-10-CM | POA: Diagnosis not present

## 2018-02-06 DIAGNOSIS — F17210 Nicotine dependence, cigarettes, uncomplicated: Secondary | ICD-10-CM | POA: Diagnosis not present

## 2018-02-06 DIAGNOSIS — R9389 Abnormal findings on diagnostic imaging of other specified body structures: Secondary | ICD-10-CM

## 2018-02-06 DIAGNOSIS — I739 Peripheral vascular disease, unspecified: Secondary | ICD-10-CM | POA: Diagnosis not present

## 2018-02-06 DIAGNOSIS — I10 Essential (primary) hypertension: Secondary | ICD-10-CM | POA: Diagnosis not present

## 2018-02-07 ENCOUNTER — Other Ambulatory Visit (HOSPITAL_COMMUNITY): Payer: Self-pay | Admitting: Internal Medicine

## 2018-02-07 DIAGNOSIS — N289 Disorder of kidney and ureter, unspecified: Secondary | ICD-10-CM

## 2018-02-07 DIAGNOSIS — R9389 Abnormal findings on diagnostic imaging of other specified body structures: Secondary | ICD-10-CM

## 2018-02-09 ENCOUNTER — Ambulatory Visit: Payer: PPO | Admitting: Orthopaedic Surgery

## 2018-02-09 ENCOUNTER — Encounter: Payer: Self-pay | Admitting: Orthopaedic Surgery

## 2018-02-09 VITALS — BP 140/90 | HR 90 | Temp 98.1°F | Ht 78.0 in | Wt 202.0 lb

## 2018-02-09 DIAGNOSIS — M25512 Pain in left shoulder: Secondary | ICD-10-CM | POA: Diagnosis not present

## 2018-02-09 DIAGNOSIS — F17210 Nicotine dependence, cigarettes, uncomplicated: Secondary | ICD-10-CM

## 2018-02-09 DIAGNOSIS — G8929 Other chronic pain: Secondary | ICD-10-CM

## 2018-02-09 MED ORDER — HYDROCODONE-ACETAMINOPHEN 7.5-325 MG PO TABS: 1.0000 | ORAL_TABLET | Freq: Four times a day (QID) | ORAL | 0 refills | Status: DC | PRN

## 2018-02-09 NOTE — Progress Notes (Signed)
Patient UU:VOZDG CAMIL WILHELMSEN, male DOB:04-28-1946, 72 y.o. UYQ:[id number]  Chief Complaint  Patient presents with  . Shoulder Pain    Recheck on left shoulder pain.    HPI  GINA COSTILLA is a 72 y.o. male who has chronic left shoulder pain.  He has more pain today with the rain.  He has no new trauma, no paresthesias.  He is doing his exercises and taking his medicine. HPI  Body mass index is 23.34 kg/m.  ROS  Review of Systems  HENT: Negative for congestion.   Respiratory: Positive for cough. Negative for shortness of breath.   Cardiovascular: Negative for chest pain.  Endocrine: Positive for cold intolerance.  Musculoskeletal: Positive for arthralgias and myalgias.  Allergic/Immunologic: Negative for environmental allergies.  All other systems reviewed and are negative.   Past Medical History:  Diagnosis Date  . Arthritis   . Hx of adenomatous colonic polyps   . Hypertension   . Pneumothorax   . Stroke [facility]) 2005   left sided weakness  . Trigger finger     Past Surgical History:  Procedure Laterality Date  . COLONOSCOPY  May 2010   Dr. fields: 5 cm terminal ileum normal, 6 polyps removed, moderate internal hemorrhoids, simple adenomas, next colonoscopy May 2015  . COLONOSCOPY N/A 08/01/2015   Procedure: COLONOSCOPY;  Surgeon: Danie Binder, MD;  [facility];  Service: Endoscopy;  Laterality: N/A;  0830  . CRANIOTOMY  2000   pressure  . INGUINAL HERNIA REPAIR Right   . OLECRANON BURSECTOMY Right 02/11/2015   Procedure: EXCISION RIGHT OLECRANON BURSA;  Surgeon: Sanjuana Kava, MD;  Location: [facility];  Service: Orthopedics;  Laterality: Right;  . repair of trigger finger Left    pinky    Family History  Problem Relation Age of Onset  . Esophageal cancer Father   . Colon cancer Neg Hx     Social History Social History   Tobacco Use  . Smoking status: Current Every Day Smoker    Packs/day: 0.50    Years: 50.00    Pack years: 25.00    Types:  Cigarettes  . Smokeless tobacco: Never Used  . Tobacco comment: 5 cigarettes per day  Substance Use Topics  . Alcohol use: Yes    Alcohol/week: 3.0 oz    Types: 5 Cans of beer per week    Comment: 3-4 beers/day  . Drug use: No    Allergies  Allergen Reactions  . Aleve [Naproxen] Other (See Comments)    Numbness in extremities   . Celecoxib Other (See Comments)    Celebrex. Nose bleed.  . Fluoxetine Hcl Swelling    Current Outpatient Medications  Medication Sig Dispense Refill  . ALPRAZolam (XANAX) 0.5 MG tablet Take 0.5 mg by mouth at bedtime as needed for sleep. Reported on 02/19/2016    . amLODipine (NORVASC) 10 MG tablet Take 10 mg by mouth daily.    Marland Kitchen aspirin EC 81 MG tablet Take 81 mg by mouth daily.    . Chlorpheniramine Maleate (ALLERGY PO) Take 1 tablet by mouth daily as needed (allergies). Reported on 02/19/2016    . diclofenac sodium (VOLTAREN) 1 % GEL Apply 2 g topically 4 (four) times daily. 100 g 0  . HYDROcodone-acetaminophen (NORCO) 7.5-325 MG tablet Take 1 tablet by mouth every 6 (six) hours as needed for moderate pain. 28 tablet 0   No current facility-administered medications for this visit.      Physical Exam  Blood pressure 140/90,  pulse 90, temperature 98.1 F (36.7 C), height 6\' 6"  (1.981 m), weight 202 lb (91.6 kg).  Constitutional: overall normal hygiene, normal nutrition, well developed, normal grooming, normal body habitus. Assistive device:none  Musculoskeletal: gait and station Limp none, muscle tone and strength are normal, no tremors or atrophy is present.  .  Neurological: coordination overall normal.  Deep tendon reflex/nerve stretch intact.  Sensation normal.  Cranial nerves II-XII intact.   Skin:   Normal overall no scars, lesions, ulcers or rashes. No psoriasis.  Psychiatric: Alert and oriented x 3.  Recent memory intact, remote memory unclear.  Normal mood and affect. Well groomed.  Good eye contact.  Cardiovascular: overall no  swelling, no varicosities, no edema bilaterally, normal temperatures of the legs and arms, no clubbing, cyanosis and good capillary refill.  Lymphatic: palpation is normal.  All other systems reviewed and are negative   The patient has been educated about the nature of the problem(s) and counseled on treatment options.  The patient appeared to understand what I have discussed and is in agreement with it.  Encounter Diagnoses  Name Primary?  . Chronic left shoulder pain Yes  . Cigarette nicotine dependence without complication     PLAN Call if any problems.  Precautions discussed.  Continue current medications.   Return to clinic 3 months   I have reviewed the [facility] web site prior to prescribing narcotic medicine for this patient.  Electronically Signed Sanjuana Kava, MD 5/23/201910:27 AM

## 2018-02-15 ENCOUNTER — Ambulatory Visit (HOSPITAL_COMMUNITY)
Admission: RE | Admit: 2018-02-15 | Discharge: 2018-02-15 | Disposition: A | Discharge status: Home / Self Care | Payer: PPO | Source: Ambulatory Visit | Attending: Internal Medicine | Admitting: Internal Medicine

## 2018-02-15 DIAGNOSIS — K7689 Other specified diseases of liver: Secondary | ICD-10-CM | POA: Diagnosis not present

## 2018-02-15 DIAGNOSIS — N289 Disorder of kidney and ureter, unspecified: Secondary | ICD-10-CM | POA: Diagnosis not present

## 2018-02-15 DIAGNOSIS — K769 Liver disease, unspecified: Secondary | ICD-10-CM | POA: Diagnosis not present

## 2018-02-15 DIAGNOSIS — R9389 Abnormal findings on diagnostic imaging of other specified body structures: Secondary | ICD-10-CM | POA: Diagnosis not present

## 2018-02-15 LAB — POCT I-STAT CREATININE: CREATININE: 0.9 mg/dL (ref 0.61–1.24)

## 2018-02-15 MED ORDER — GADOBENATE DIMEGLUMINE 529 MG/ML IV SOLN
20.0000 mL | Freq: Once | INTRAVENOUS | Status: AC | PRN
  Administered 2018-02-15: 20 mL via INTRAVENOUS

## 2018-02-16 ENCOUNTER — Telehealth: Payer: Self-pay | Admitting: Orthopaedic Surgery

## 2018-02-16 MED ORDER — HYDROCODONE-ACETAMINOPHEN 7.5-325 MG PO TABS: 1.0000 | ORAL_TABLET | Freq: Four times a day (QID) | ORAL | 0 refills | Status: DC | PRN

## 2018-02-16 NOTE — Telephone Encounter (Signed)
Patient requests refill on Hydrocodone/Acetaminophen 7.5-325  Mgs.   Qty  28 ° ° °Sig: Take 1 tablet by mouth every 6 (six) hours as needed for moderate pain. ° °Patient uses [facility] °

## 2018-02-23 ENCOUNTER — Telehealth: Payer: Self-pay | Admitting: Orthopaedic Surgery

## 2018-02-23 MED ORDER — HYDROCODONE-ACETAMINOPHEN 7.5-325 MG PO TABS: 1.0000 | ORAL_TABLET | Freq: Four times a day (QID) | ORAL | 0 refills | Status: DC | PRN

## 2018-02-23 NOTE — Telephone Encounter (Signed)
Hydrocodone-Acetaminophen  7.5/325 mg  Qty  28 Tablets ° °PATIENT USES [facility] °

## 2018-03-02 ENCOUNTER — Telehealth: Payer: Self-pay | Admitting: Orthopaedic Surgery

## 2018-03-02 NOTE — Telephone Encounter (Signed)
Patient requests refill on Hydrocodone/Acetaminophen 7.5-325  Mgs.   Qty  28 ° ° ° °Sig: Take 1 tablet by mouth every 6 (six) hours as needed for moderate pain.  °      ° °Patient states he uses [facility] °

## 2018-03-06 MED ORDER — HYDROCODONE-ACETAMINOPHEN 7.5-325 MG PO TABS: 1.0000 | ORAL_TABLET | Freq: Four times a day (QID) | ORAL | 0 refills | Status: DC | PRN

## 2018-03-09 ENCOUNTER — Telehealth: Payer: Self-pay | Admitting: Orthopaedic Surgery

## 2018-03-09 NOTE — Telephone Encounter (Signed)
Too early.  Just did it Monday.

## 2018-03-09 NOTE — Telephone Encounter (Signed)
Hydrocodone-Acetaminophen  7.5/325 mg  Qty 28 Tablets  PATIENT USES [facility]

## 2018-03-14 ENCOUNTER — Telehealth: Payer: Self-pay | Admitting: Orthopaedic Surgery

## 2018-03-14 NOTE — Telephone Encounter (Signed)
Patient requests refill on Hydrocodone/Acetaminophen 7.5-325  Mgs.   Qty  28    Sig: Take 1 tablet by mouth every 6 (six) hours as needed for moderate pain.        Patient states he uses [facility]

## 2018-03-15 MED ORDER — HYDROCODONE-ACETAMINOPHEN 7.5-325 MG PO TABS: 1.0000 | ORAL_TABLET | Freq: Four times a day (QID) | ORAL | 0 refills | Status: DC | PRN

## 2018-03-22 ENCOUNTER — Telehealth: Payer: Self-pay | Admitting: Orthopaedic Surgery

## 2018-03-22 MED ORDER — HYDROCODONE-ACETAMINOPHEN 7.5-325 MG PO TABS: 1.0000 | ORAL_TABLET | Freq: Four times a day (QID) | ORAL | 0 refills | Status: DC | PRN

## 2018-03-22 NOTE — Telephone Encounter (Signed)
Hydrocodone-Acetaminophen  7.5/325 mg  Qty  28 Tablets  PATIENT USES [facility]

## 2018-03-28 ENCOUNTER — Telehealth: Payer: Self-pay | Admitting: Orthopaedic Surgery

## 2018-03-28 NOTE — Telephone Encounter (Signed)
Patient requests refill on Hydrocodone/Acetaminophen 7.5-325  Mgs.   Qty  28   Sig: Take 1 tablet by mouth every 6 (six) hours as needed for moderate pain.  Patient uses [facility]

## 2018-03-29 MED ORDER — HYDROCODONE-ACETAMINOPHEN 7.5-325 MG PO TABS: 1.0000 | ORAL_TABLET | Freq: Four times a day (QID) | ORAL | 0 refills | Status: DC | PRN

## 2018-04-04 ENCOUNTER — Telehealth: Payer: Self-pay | Admitting: Orthopaedic Surgery

## 2018-04-04 MED ORDER — HYDROCODONE-ACETAMINOPHEN 7.5-325 MG PO TABS: 1.0000 | ORAL_TABLET | Freq: Four times a day (QID) | ORAL | 0 refills | Status: DC | PRN

## 2018-04-04 NOTE — Telephone Encounter (Signed)
Patient requests refill on Hydrocodone/Acetaminophen 7.5-325  mgs.  Qty  28       Sig: Take 1 tablet by mouth every 6 (six) hours as needed for moderate pain.   Patient states he uses [facility]

## 2018-04-11 ENCOUNTER — Telehealth: Payer: Self-pay | Admitting: Orthopaedic Surgery

## 2018-04-11 NOTE — Telephone Encounter (Signed)
Patient requests refill on Hydrocodone/Acetaminophen 7.5-325  Mgs.   Qt  25    Sig: Take 1 tablet by mouth every 6 (six) hours as needed for moderate pain.  Patient states he uses [facility]

## 2018-04-12 MED ORDER — HYDROCODONE-ACETAMINOPHEN 7.5-325 MG PO TABS: 1.0000 | ORAL_TABLET | Freq: Four times a day (QID) | ORAL | 0 refills | Status: DC | PRN

## 2018-04-18 ENCOUNTER — Telehealth: Payer: Self-pay | Admitting: Orthopaedic Surgery

## 2018-04-18 MED ORDER — HYDROCODONE-ACETAMINOPHEN 7.5-325 MG PO TABS: 1.0000 | ORAL_TABLET | Freq: Four times a day (QID) | ORAL | 0 refills | Status: DC | PRN

## 2018-04-18 NOTE — Telephone Encounter (Signed)
Patient requests refill on Hydrocodone/Acetaminophen 7.5-325 mgs.  Qty  25       Sig: Take 1 tablet by mouth every 6 (six) hours as needed for moderate pain.   Patient states he uses [facility]

## 2018-04-25 ENCOUNTER — Telehealth: Payer: Self-pay | Admitting: Orthopaedic Surgery

## 2018-04-25 MED ORDER — HYDROCODONE-ACETAMINOPHEN 7.5-325 MG PO TABS: 1.0000 | ORAL_TABLET | Freq: Four times a day (QID) | ORAL | 0 refills | Status: DC | PRN

## 2018-04-25 NOTE — Telephone Encounter (Signed)
Hydrocodone-Acetaminophen  7.5/325MG   Qty 25 Tablets  PATIENT USES  PHARMACY

## 2018-05-02 ENCOUNTER — Telehealth: Payer: Self-pay | Admitting: Orthopaedic Surgery

## 2018-05-02 NOTE — Telephone Encounter (Signed)
Hydrocodone-Acetaminophen  7.5/325 mg  Qty 25 Tablets ° °PATIENT USES [facility] °

## 2018-05-03 MED ORDER — HYDROCODONE-ACETAMINOPHEN 7.5-325 MG PO TABS: 1.0000 | ORAL_TABLET | Freq: Four times a day (QID) | ORAL | 0 refills | Status: DC | PRN

## 2018-05-09 DIAGNOSIS — I739 Peripheral vascular disease, unspecified: Secondary | ICD-10-CM | POA: Diagnosis not present

## 2018-05-09 DIAGNOSIS — I10 Essential (primary) hypertension: Secondary | ICD-10-CM | POA: Diagnosis not present

## 2018-05-09 DIAGNOSIS — N400 Enlarged prostate without lower urinary tract symptoms: Secondary | ICD-10-CM | POA: Diagnosis not present

## 2018-05-10 ENCOUNTER — Ambulatory Visit: Payer: PPO | Admitting: Orthopaedic Surgery

## 2018-05-10 ENCOUNTER — Encounter: Payer: Self-pay | Admitting: Orthopaedic Surgery

## 2018-05-10 DIAGNOSIS — G8929 Other chronic pain: Secondary | ICD-10-CM

## 2018-05-10 DIAGNOSIS — F17210 Nicotine dependence, cigarettes, uncomplicated: Secondary | ICD-10-CM

## 2018-05-10 DIAGNOSIS — M25512 Pain in left shoulder: Secondary | ICD-10-CM | POA: Diagnosis not present

## 2018-05-10 MED ORDER — HYDROCODONE-ACETAMINOPHEN 7.5-325 MG PO TABS: 1.0000 | ORAL_TABLET | Freq: Four times a day (QID) | ORAL | 0 refills | Status: DC | PRN

## 2018-05-10 NOTE — Progress Notes (Signed)
[name], male DOB:08/01/1946, 72 y.o. BHA:[id number]  Chief Complaint  Patient presents with  . Shoulder Pain    chronic left shoulder pain    HPI  Larry Reese is a 72 y.o. male who has chronic shoulder pain on the left.  He has no new trauma, no numbness.  He has been doing his exercises.  He has pain with overhead use and when rolling over on it at night.  He is taking his medicine.  ROS  Review of Systems  HENT: Negative for congestion.   Respiratory: Positive for cough. Negative for shortness of breath.   Cardiovascular: Negative for chest pain.  Endocrine: Positive for cold intolerance.  Musculoskeletal: Positive for arthralgias and myalgias.  Allergic/Immunologic: Negative for environmental allergies.  All other systems reviewed and are negative.   All other systems reviewed and are negative.  The following is a summary of the past history medically, past history surgically, known current medicines, social history and family history.  This information is gathered electronically by the computer from prior information and documentation.  I review this each visit and have found including this information at this point in the chart is beneficial and informative.    Past Medical History:  Diagnosis Date  . Arthritis   . Hx of adenomatous colonic polyps   . Hypertension   . Pneumothorax   . Stroke [facility]) 2005   left sided weakness  . Trigger finger     Past Surgical History:  Procedure Laterality Date  . COLONOSCOPY  May 2010   Dr. fields: 5 cm terminal ileum normal, 6 polyps removed, moderate internal hemorrhoids, simple adenomas, next colonoscopy May 2015  . COLONOSCOPY N/A 08/01/2015   Procedure: COLONOSCOPY;  Surgeon: Danie Binder, MD;  Location: [facility];  Service: Endoscopy;  Laterality: N/A;  0830  . CRANIOTOMY  2000   pressure  . INGUINAL HERNIA REPAIR Right   . OLECRANON BURSECTOMY Right 02/11/2015   Procedure: EXCISION RIGHT  OLECRANON BURSA;  Surgeon: Sanjuana Kava, MD;  Location: [facility] ORS;  Service: Orthopedics;  Laterality: Right;  . repair of trigger finger Left    pinky    Family History  Problem Relation Age of Onset  . Esophageal cancer Father   . Colon cancer Neg Hx     Social History Social History   Tobacco Use  . Smoking status: Current Every Day Smoker    Packs/day: 0.50    Years: 50.00    Pack years: 25.00    Types: Cigarettes  . Smokeless tobacco: Never Used  . Tobacco comment: 5 cigarettes per day  Substance Use Topics  . Alcohol use: Yes    Alcohol/week: 5.0 standard drinks    Types: 5 Cans of beer per week    Comment: 3-4 beers/day  . Drug use: No    Allergies  Allergen Reactions  . Aleve [Naproxen] Other (See Comments)    Numbness in extremities   . Celecoxib Other (See Comments)    Celebrex. Nose bleed.  . Fluoxetine Hcl Swelling    Current Outpatient Medications  Medication Sig Dispense Refill  . ALPRAZolam (XANAX) 0.5 MG tablet Take 0.5 mg by mouth at bedtime as needed for sleep. Reported on 02/19/2016    . amLODipine (NORVASC) 10 MG tablet Take 10 mg by mouth daily.    Marland Kitchen aspirin EC 81 MG tablet Take 81 mg by mouth daily.    . Chlorpheniramine Maleate (ALLERGY PO) Take 1 tablet by mouth daily  as needed (allergies). Reported on 02/19/2016    . diclofenac sodium (VOLTAREN) 1 % GEL Apply 2 g topically 4 (four) times daily. 100 g 0  . HYDROcodone-acetaminophen (NORCO) 7.5-325 MG tablet Take 1 tablet by mouth every 6 (six) hours as needed for moderate pain. 25 tablet 0   No current facility-administered medications for this visit.      Physical Exam  BP 147/96, pulse 79, height 6 foot 6.5 inches, 211 pounds. Constitutional: overall normal hygiene, normal nutrition, well developed, normal grooming, normal body habitus. Assistive device:none  Musculoskeletal: gait and station Limp none, muscle tone and strength are normal, no tremors or atrophy is present.  .   Neurological: coordination overall normal.  Deep tendon reflex/nerve stretch intact.  Sensation normal.  Cranial nerves II-XII intact.   Skin:   Normal overall no scars, lesions, ulcers or rashes. No psoriasis.  Psychiatric: Alert and oriented x 3.  Recent memory intact, remote memory unclear.  Normal mood and affect. Well groomed.  Good eye contact.  Cardiovascular: overall no swelling, no varicosities, no edema bilaterally, normal temperatures of the legs and arms, no clubbing, cyanosis and good capillary refill.  Lymphatic: palpation is normal.  Examination of left Upper Extremity is done.  Inspection:   Overall:  Elbow non-tender without crepitus or defects, forearm non-tender without crepitus or defects, wrist non-tender without crepitus or defects, hand non-tender.    Shoulder: with glenohumeral joint tenderness, without effusion.   Upper arm: without swelling and tenderness   Range of motion:   Overall:  Full range of motion of the elbow, full range of motion of wrist and full range of motion in fingers.   Shoulder:  left  165 degrees forward flexion; 150 degrees abduction; 35 degrees internal rotation, 35 degrees external rotation, 15 degrees extension, 40 degrees adduction.   Stability:   Overall:  Shoulder, elbow and wrist stable   Strength and Tone:   Overall full shoulder muscles strength, full upper arm strength and normal upper arm bulk and tone.  All other systems reviewed and are negative   The patient has been educated about the nature of the problem(s) and counseled on treatment options.  The patient appeared to understand what I have discussed and is in agreement with it.  Encounter Diagnoses  Name Primary?  . Chronic left shoulder pain Yes  . Cigarette nicotine dependence without complication     PLAN Call if any problems.  Precautions discussed.  Continue current medications.   Return to clinic 3 months   I have reviewed the [place] web site prior to prescribing narcotic medicine for this patient.  The patient has read and signed an Opioid Treatment Agreement which has been scanned and added to the medical record.  The patient understands the agreement and agrees to abide with it.  The patient has chronic pain that is being treated with an opioid which relieves the pain.  The patient understands potential complications with chronic opioid treatment.  Electronically Signed Sanjuana Kava, MD 8/21/201910:42 AM

## 2018-05-11 ENCOUNTER — Ambulatory Visit: Payer: PPO | Admitting: Orthopaedic Surgery

## 2018-05-17 ENCOUNTER — Telehealth: Payer: Self-pay | Admitting: Orthopaedic Surgery

## 2018-05-17 MED ORDER — HYDROCODONE-ACETAMINOPHEN 7.5-325 MG PO TABS: 1.0000 | ORAL_TABLET | Freq: Four times a day (QID) | ORAL | 0 refills | Status: DC | PRN

## 2018-05-17 NOTE — Addendum Note (Signed)
Addended by: Willette Pa on: 05/17/2018 03:57 PM   Modules accepted: Orders

## 2018-05-17 NOTE — Telephone Encounter (Signed)
Hydrocodone-Acetaminophen  7.5/325 mg  Qty 25 Tablets ° °PATIENT USES [facility] °

## 2018-05-24 ENCOUNTER — Telehealth: Payer: Self-pay | Admitting: Orthopaedic Surgery

## 2018-05-24 MED ORDER — HYDROCODONE-ACETAMINOPHEN 7.5-325 MG PO TABS: 1.0000 | ORAL_TABLET | Freq: Four times a day (QID) | ORAL | 0 refills | Status: DC | PRN

## 2018-05-24 NOTE — Telephone Encounter (Signed)
Hydrocodone-Acetaminophen  7.5/325 mg  Qty 25 Tablets ° °PATIENT USES [facility] °

## 2018-05-31 ENCOUNTER — Telehealth: Payer: Self-pay | Admitting: Orthopaedic Surgery

## 2018-05-31 MED ORDER — HYDROCODONE-ACETAMINOPHEN 7.5-325 MG PO TABS: 1.0000 | ORAL_TABLET | Freq: Four times a day (QID) | ORAL | 0 refills | Status: DC | PRN

## 2018-05-31 NOTE — Telephone Encounter (Signed)
Patient requests refill on Hydrocodone/Acetaminophen 7.5-325  Mgs. Qty  25  Sig: Take 1 tablet by mouth every 6 (six) hours as needed for moderate pain.  Patient uses [facility]

## 2018-06-07 ENCOUNTER — Telehealth: Payer: Self-pay | Admitting: Orthopaedic Surgery

## 2018-06-07 MED ORDER — HYDROCODONE-ACETAMINOPHEN 7.5-325 MG PO TABS: 1.0000 | ORAL_TABLET | Freq: Four times a day (QID) | ORAL | 0 refills | Status: DC | PRN

## 2018-06-07 NOTE — Telephone Encounter (Signed)
Patient called for refill:  HYDROcodone-acetaminophen (NORCO) 7.5-325 MG tablet 1 tablet Oral Every 6 hours PRN  [place]

## 2018-06-08 ENCOUNTER — Encounter: Payer: Self-pay | Admitting: Family

## 2018-06-08 ENCOUNTER — Ambulatory Visit (INDEPENDENT_AMBULATORY_CARE_PROVIDER_SITE_OTHER): Payer: PPO | Admitting: Family

## 2018-06-08 ENCOUNTER — Ambulatory Visit (HOSPITAL_COMMUNITY)
Admission: RE | Admit: 2018-06-08 | Discharge: 2018-06-08 | Disposition: A | Discharge status: Home / Self Care | Payer: PPO | Source: Ambulatory Visit | Attending: Vascular Surgery | Admitting: Vascular Surgery

## 2018-06-08 ENCOUNTER — Other Ambulatory Visit: Payer: Self-pay

## 2018-06-08 VITALS — BP 133/82 | HR 74 | Temp 98.7°F | Resp 20 | Ht 78.0 in | Wt 207.0 lb

## 2018-06-08 DIAGNOSIS — I10 Essential (primary) hypertension: Secondary | ICD-10-CM | POA: Insufficient documentation

## 2018-06-08 DIAGNOSIS — F17200 Nicotine dependence, unspecified, uncomplicated: Secondary | ICD-10-CM | POA: Diagnosis not present

## 2018-06-08 DIAGNOSIS — R208 Other disturbances of skin sensation: Secondary | ICD-10-CM | POA: Diagnosis not present

## 2018-06-08 DIAGNOSIS — I739 Peripheral vascular disease, unspecified: Secondary | ICD-10-CM | POA: Diagnosis not present

## 2018-06-08 DIAGNOSIS — R9389 Abnormal findings on diagnostic imaging of other specified body structures: Secondary | ICD-10-CM | POA: Insufficient documentation

## 2018-06-08 DIAGNOSIS — I779 Disorder of arteries and arterioles, unspecified: Secondary | ICD-10-CM

## 2018-06-08 DIAGNOSIS — R0989 Other specified symptoms and signs involving the circulatory and respiratory systems: Secondary | ICD-10-CM | POA: Diagnosis present

## 2018-06-08 NOTE — Patient Instructions (Addendum)
Steps to Quit Smoking Smoking tobacco can be bad for your health. It can also affect almost every organ in your body. Smoking puts you and people around you at risk for many serious long-lasting (chronic) diseases. Quitting smoking is hard, but it is one of the best things that you can do for your health. It is never too late to quit. What are the benefits of quitting smoking? When you quit smoking, you lower your risk for getting serious diseases and conditions. They can include:  Lung cancer or lung disease.  Heart disease.  Stroke.  Heart attack.  Not being able to have children (infertility).  Weak bones (osteoporosis) and broken bones (fractures).  If you have coughing, wheezing, and shortness of breath, those symptoms may get better when you quit. You may also get sick less often. If you are pregnant, quitting smoking can help to lower your chances of having a baby of low birth weight. What can I do to help me quit smoking? Talk with your doctor about what can help you quit smoking. Some things you can do (strategies) include:  Quitting smoking totally, instead of slowly cutting back how much you smoke over a period of time.  Going to in-person counseling. You are more likely to quit if you go to many counseling sessions.  Using resources and support systems, such as: ? Online chats with a counselor. ? Phone quitlines. ? Printed self-help materials. ? Support groups or group counseling. ? Text messaging programs. ? Mobile phone apps or applications.  Taking medicines. Some of these medicines may have nicotine in them. If you are pregnant or breastfeeding, do not take any medicines to quit smoking unless your doctor says it is okay. Talk with your doctor about counseling or other things that can help you.  Talk with your doctor about using more than one strategy at the same time, such as taking medicines while you are also going to in-person counseling. This can help make  quitting easier. What things can I do to make it easier to quit? Quitting smoking might feel very hard at first, but there is a lot that you can do to make it easier. Take these steps:  Talk to your family and friends. Ask them to support and encourage you.  Call phone quitlines, reach out to support groups, or work with a counselor.  Ask people who smoke to not smoke around you.  Avoid places that make you want (trigger) to smoke, such as: ? Bars. ? Parties. ? Smoke-break areas at work.  Spend time with people who do not smoke.  Lower the stress in your life. Stress can make you want to smoke. Try these things to help your stress: ? Getting regular exercise. ? Deep-breathing exercises. ? Yoga. ? Meditating. ? Doing a body scan. To do this, close your eyes, focus on one area of your body at a time from head to toe, and notice which parts of your body are tense. Try to relax the muscles in those areas.  Download or buy apps on your mobile phone or tablet that can help you stick to your quit plan. There are many free apps, such as QuitGuide from the CDC (Centers for Disease Control and Prevention). You can find more support from smokefree.gov and other websites.  This information is not intended to replace advice given to you by your health care provider. Make sure you discuss any questions you have with your health care provider. Document Released: 07/03/2009 Document   Revised: 05/04/2016 Document Reviewed: 01/21/2015 Elsevier Interactive Patient Education  2018 Elsevier Inc.     Peripheral Vascular Disease Peripheral vascular disease (PVD) is a disease of the blood vessels that are not part of your heart and brain. A simple term for PVD is poor circulation. In most cases, PVD narrows the blood vessels that carry blood from your heart to the rest of your body. This can result in a decreased supply of blood to your arms, legs, and internal organs, like your stomach or kidneys.  However, it most often affects a person's lower legs and feet. There are two types of PVD.  Organic PVD. This is the more common type. It is caused by damage to the structure of blood vessels.  Functional PVD. This is caused by conditions that make blood vessels contract and tighten (spasm).  Without treatment, PVD tends to get worse over time. PVD can also lead to acute ischemic limb. This is when an arm or limb suddenly has trouble getting enough blood. This is a medical emergency. Follow these instructions at home:  Take medicines only as told by your doctor.  Do not use any tobacco products, including cigarettes, chewing tobacco, or electronic cigarettes. If you need help quitting, ask your doctor.  Lose weight if you are overweight, and maintain a healthy weight as told by your doctor.  Eat a diet that is low in fat and cholesterol. If you need help, ask your doctor.  Exercise regularly. Ask your doctor for some good activities for you.  Take good care of your feet. ? Wear comfortable shoes that fit well. ? Check your feet often for any cuts or sores. Contact a doctor if:  You have cramps in your legs while walking.  You have leg pain when you are at rest.  You have coldness in a leg or foot.  Your skin changes.  You are unable to get or have an erection (erectile dysfunction).  You have cuts or sores on your feet that are not healing. Get help right away if:  Your arm or leg turns cold and blue.  Your arms or legs become red, warm, swollen, painful, or numb.  You have chest pain or trouble breathing.  You suddenly have weakness in your face, arm, or leg.  You become very confused or you cannot speak.  You suddenly have a very bad headache.  You suddenly cannot see. This information is not intended to replace advice given to you by your health care provider. Make sure you discuss any questions you have with your health care provider. Document Released:  12/01/2009 Document Revised: 02/12/2016 Document Reviewed: 02/14/2014 Elsevier Interactive Patient Education  2017 Elsevier Inc.  

## 2018-06-08 NOTE — Progress Notes (Signed)
[facility] [place]   CC: Follow up peripheral artery occlusive disease  History of Present Illness Larry Reese is a 72 y.o. male with several year history of right leg pain. This initially started in 2016.   The patient had pain in his right groin and anterior thigh. This improved a little bit but he still has pain primarily in his right thigh at his July 2018 visit. He also had some weakness and pain that occured in his right lower extremity. He denies rest pain. He has no nonhealing wounds. He denies family history of abdominal aortic aneurysm.  Dr. Oneida Alar last evaluated pt on 03-31-17. At that time he did not believe that the right thigh pain or groin pain was related to PAD.  However some of the weakness symptoms that he has in his right leg may be related. However, he does not really have any symptoms in the left leg which has similar perfusion. The patient has some element of peripheral arterial disease. He does not really feel debilitated by this.Dr. Oneida Alar indicated that the the best option initially would be conservative management with smoking cessation walking program of 30 minutes daily. The patient was to return for follow-up with repeat ABIs in 6 months time. If his symptoms become worse over time we would consider an intervention at that point.  At his March 2019 visit he was concerned about right hip pain that was present sitting, supine, or walking, did not indicate thigh or calf issues.  He had an evaluation of a liver lesion in March 2019, pt states no change in size, states not malignant he was told. From CT at that time with contrast: Vascular/Lymphatic: Atherosclerotic nonaneurysmal abdominal aorta.Patent portal, splenic, hepatic and renal veins. No pathologically enlarged lymph nodes in the abdomen.   He c/o burning in the soles of both feet, states this limits his walking; pt states burning in the right foot is worse than the left.     Diabetic: No Tobacco use: smoker  (1 pack in 4-5 days, was 1 ppd, started in his teens)  Pt meds include: Statin :No ASA: yes Other anticoagulants/antiplatelets: no    Past Medical History:  Diagnosis Date  . Arthritis   . Hx of adenomatous colonic polyps   . Hypertension   . Pneumothorax   . Stroke [facility]) 2005   left sided weakness  . Trigger finger     Social History Social History   Tobacco Use  . Smoking status: Current Every Day Smoker    Packs/day: 0.25    Years: 50.00    Pack years: 12.50    Types: Cigarettes  . Smokeless tobacco: Never Used  . Tobacco comment: 5 cigarettes per day  Substance Use Topics  . Alcohol use: Yes    Alcohol/week: 5.0 standard drinks    Types: 5 Cans of beer per week    Comment: 3-4 beers/day  . Drug use: No    Family History Family History  Problem Relation Age of Onset  . Esophageal cancer Father   . Colon cancer Neg Hx     Past Surgical History:  Procedure Laterality Date  . COLONOSCOPY  May 2010   Dr. fields: 5 cm terminal ileum normal, 6 polyps removed, moderate internal hemorrhoids, simple adenomas, next colonoscopy May 2015  . COLONOSCOPY N/A 08/01/2015   Procedure: COLONOSCOPY;  Surgeon: Danie Binder, MD;  [facility];  Service: Endoscopy;  Laterality: N/A;  0830  . CRANIOTOMY  2000   pressure  . INGUINAL HERNIA REPAIR Right   . OLECRANON BURSECTOMY Right 02/11/2015   Procedure: EXCISION RIGHT OLECRANON BURSA;  Surgeon: Sanjuana Kava, MD;  Location: [facility] ORS;  Service: Orthopedics;  Laterality: Right;  . repair of trigger finger Left    pinky    Allergies  Allergen Reactions  . Aleve [Naproxen] Other (See Comments)    Numbness in extremities   . Celecoxib Other (See Comments)    Celebrex. Nose bleed.  . Fluoxetine Hcl Swelling    Current Outpatient Medications  Medication Sig Dispense Refill  . ALPRAZolam (XANAX) 0.5 MG tablet Take 0.5 mg by mouth at bedtime as needed for sleep. Reported on  02/19/2016    . amLODipine (NORVASC) 10 MG tablet Take 10 mg by mouth daily.    Marland Kitchen aspirin EC 81 MG tablet Take 81 mg by mouth daily.    . Chlorpheniramine Maleate (ALLERGY PO) Take 1 tablet by mouth daily as needed (allergies). Reported on 02/19/2016    . diclofenac sodium (VOLTAREN) 1 % GEL Apply 2 g topically 4 (four) times daily. 100 g 0  . HYDROcodone-acetaminophen (NORCO) 7.5-325 MG tablet Take 1 tablet by mouth every 6 (six) hours as needed for moderate pain. 25 tablet 0   No current facility-administered medications for this visit.     ROS: See HPI for pertinent positives and negatives.   Physical Examination  Vitals:   06/08/18 1355  BP: 133/82  Pulse: 74  Resp: 20  Temp: 98.7 F (37.1 C)  TempSrc: Oral  SpO2: 98%  Weight: 207 lb (93.9 kg)  Height: 6\' 6"  (1.981 m)   Body mass index is 23.92 kg/m.  General: A&O x 3, WDWN, male. Gait: normal HENT: No gross abnormalities.  Eyes: PERRLA. Pulmonary: Respirations are non labored, CTAB, good air movement in all fields Cardiac: regular rhythm, no detected murmur.         Carotid Bruits Right Left   Negative Negative   Radial pulses are 2+ palpable bilaterally   Adominal aortic pulse is not palpable                         VASCULAR EXAM: Extremities without ischemic changes, without Gangrene; without open wounds.                                                                                                                                                       LE Pulses Right Left       FEMORAL  2+ palpable  2+ palpable        POPLITEAL  not palpable   not palpable       POSTERIOR TIBIAL  not palpable   not palpable        DORSALIS PEDIS  ANTERIOR TIBIAL not palpable  not palpable    Abdomen: soft, NT, no palpable masses. Skin: no rashes, no cellulitis, no ulcers noted. Musculoskeletal: no muscle wasting or atrophy.      Neurologic: A&O X 3; appropriate affect, Sensation is normal; MOTOR  FUNCTION:  moving all extremities equally, motor strength 5/5 throughout. Speech is fluent/normal. CN 2-12 intact. Psychiatric: Thought content is normal, mood appropriate for clinical situation.    ASSESSMENT: Larry Reese is a 72 y.o. male with moderate bilateral lower extremity arterial occlusive disease. Bilateral lower extremity duplex on 03-31-17 demonstrated no significant focal stenosis.   Bilateral femoral pulses are 2+ palpable. It is unlikely that his right hip pain is from lack of arterial perfusion.  Consider OA of the right hip or lumbar spine issues, defer to his PCP.   His walking is limited now by the burning sensation in the soles of his feet.   He does not have DM but does continue to smoke, has been trying to decrease use.   DATA  ABI (Date: 06/08/2018):  R:   ABI: 0.75 (was 0.71 on 12-02-17),   PT: mono  DP: mono  TBI:  0.51, toe pressure 66 (was 0.67)  L:   ABI: 0.62 (was 0.76),   PT: mono  DP: mono  TBI: 0.27, toe pressure 35 (was 0.50) Decline in bilateral ABI and TBI. Moderate disease in both lower extremities, all monophasic waveforms.     PLAN:  Daily seated leg exercises demonstrated and discussed.   Graduated walking program discussed and how to achieve, to a total of at least 30 minutes daily.   Based on the patient's vascular studies and examination, pt will return to clinic in 9 months with ABI's.  I advised him to notify us if he develops concerns re the circulation in his feet or legs.   The patient was counseled re smoking cessation and given several free resources re smoking cessation.  I discussed in depth with the patient the nature of atherosclerosis, and emphasized the importance of maximal medical management including strict control of blood pressure, blood glucose, and lipid levels, obtaining regular exercise, and cessation of smoking.  The patient is aware that without maximal medical management the underlying  atherosclerotic disease process will progress, limiting the benefit of any interventions.  The patient was given information about PAD including signs, symptoms, treatment, what symptoms should prompt the patient to seek immediate medical care, and risk reduction measures to take.  Clemon Chambers, RN, MSN, FNP-C [facility] Phone: [phone]  Clinic MD: Oneida Alar  06/08/18 2:17 PM

## 2018-06-14 ENCOUNTER — Telehealth: Payer: Self-pay | Admitting: Orthopaedic Surgery

## 2018-06-14 MED ORDER — HYDROCODONE-ACETAMINOPHEN 7.5-325 MG PO TABS: 1.0000 | ORAL_TABLET | Freq: Four times a day (QID) | ORAL | 0 refills | Status: DC | PRN

## 2018-06-14 NOTE — Telephone Encounter (Signed)
Patient called for refill:   HYDROcodone-acetaminophen (NORCO) 7.5-325 MG tablet 1 tablet Oral Every 6 hours    [place]

## 2018-06-21 ENCOUNTER — Telehealth: Payer: Self-pay | Admitting: Orthopaedic Surgery

## 2018-06-21 MED ORDER — HYDROCODONE-ACETAMINOPHEN 7.5-325 MG PO TABS: 1.0000 | ORAL_TABLET | Freq: Four times a day (QID) | ORAL | 0 refills | Status: DC | PRN

## 2018-06-21 NOTE — Telephone Encounter (Signed)
Hydrocodone-Acetaminophen  7.5/325 mg  Qty 25 Tablets ° °PATIENT USES [facility] °

## 2018-06-24 ENCOUNTER — Encounter (HOSPITAL_COMMUNITY): Payer: Self-pay | Admitting: Emergency Medicine

## 2018-06-24 ENCOUNTER — Emergency Department (HOSPITAL_COMMUNITY)
Admission: EM | Admit: 2018-06-24 | Discharge: 2018-06-25 | Disposition: A | Discharge status: Home / Self Care | Payer: PPO | Attending: Emergency Medicine | Admitting: Emergency Medicine

## 2018-06-24 ENCOUNTER — Emergency Department (HOSPITAL_COMMUNITY): Payer: PPO

## 2018-06-24 ENCOUNTER — Other Ambulatory Visit: Payer: Self-pay

## 2018-06-24 DIAGNOSIS — M79672 Pain in left foot: Secondary | ICD-10-CM

## 2018-06-24 DIAGNOSIS — F17210 Nicotine dependence, cigarettes, uncomplicated: Secondary | ICD-10-CM | POA: Insufficient documentation

## 2018-06-24 DIAGNOSIS — Z79899 Other long term (current) drug therapy: Secondary | ICD-10-CM | POA: Insufficient documentation

## 2018-06-24 DIAGNOSIS — Z8673 Personal history of transient ischemic attack (TIA), and cerebral infarction without residual deficits: Secondary | ICD-10-CM | POA: Diagnosis not present

## 2018-06-24 DIAGNOSIS — Z7982 Long term (current) use of aspirin: Secondary | ICD-10-CM | POA: Diagnosis not present

## 2018-06-24 DIAGNOSIS — M25572 Pain in left ankle and joints of left foot: Secondary | ICD-10-CM | POA: Insufficient documentation

## 2018-06-24 DIAGNOSIS — I10 Essential (primary) hypertension: Secondary | ICD-10-CM | POA: Diagnosis not present

## 2018-06-24 MED ORDER — HYDROCODONE-ACETAMINOPHEN 5-325 MG PO TABS
1.0000 | ORAL_TABLET | Freq: Once | ORAL | Status: AC
  Administered 2018-06-24: 1 via ORAL
  Filled 2018-06-24: qty 1

## 2018-06-24 MED ORDER — HYDROCODONE-ACETAMINOPHEN 7.5-325 MG PO TABS: 1.0000 | ORAL_TABLET | Freq: Once | ORAL | Status: DC

## 2018-06-24 NOTE — ED Notes (Signed)
Pt denies taking anything for pain at home.

## 2018-06-24 NOTE — ED Provider Notes (Addendum)
[facility] EMERGENCY DEPARTMENT Provider Note   CSN: [id number] Arrival date & time: 06/24/18  2113     History   Chief Complaint Chief Complaint  Larry Reese presents with  . Leg Pain    HPI Larry Reese is a 72 y.o. male.  HPI   Larry Reese is a 72 year old male with history of arthritis, polyps, hypertension, pneumothorax, CVA, who presents emergency department today for evaluation of left leg pain.  Larry Reese states that he stood up after sitting on his recliner today and felt pain that radiated down from his left mid thigh to his foot.  States that he now has pain to his entire foot and has difficulty walking due to pain.  No longer has any pain to the upper part of the left lower extremity.  His wife feels that his left ankle is somewhat swollen.  Larry Reese denies any recent trauma or falls.  No fevers or chills.  Pain is constant, severe in nature, worse with ambulation and palpation.  Past Medical History:  Diagnosis Date  . Arthritis   . Hx of adenomatous colonic polyps   . Hypertension   . Pneumothorax   . Stroke [facility]) 2005   left sided weakness  . Trigger finger     Larry Reese Active Problem List   Diagnosis Date Noted  . History of colonic polyps   . Hepatitis, alcoholic 54/62/7035  . ETOH abuse 02/10/2015  . Internal hemorrhoids 07/17/2009  . Colon adenomas 07/15/2009    Past Surgical History:  Procedure Laterality Date  . COLONOSCOPY  May 2010   Dr. fields: 5 cm terminal ileum normal, 6 polyps removed, moderate internal hemorrhoids, simple adenomas, next colonoscopy May 2015  . COLONOSCOPY N/A 08/01/2015   Procedure: COLONOSCOPY;  Surgeon: Danie Binder, MD;  [facility];  Service: Endoscopy;  Laterality: N/A;  0830  . CRANIOTOMY  2000   pressure  . INGUINAL HERNIA REPAIR Right   . OLECRANON BURSECTOMY Right 02/11/2015   Procedure: EXCISION RIGHT OLECRANON BURSA;  Surgeon: Sanjuana Kava, MD;  Location: [facility];  Service: Orthopedics;  Laterality:  Right;  . repair of trigger finger Left    pinky        Home Medications    Prior to Admission medications   Medication Sig Start Date End Date Taking? Authorizing Provider  ALPRAZolam Duanne [name]) 0.5 MG tablet Take 0.5 mg by mouth at bedtime as needed for sleep. Reported on 02/19/2016    [provider]  amLODipine (NORVASC) 10 MG tablet Take 10 mg by mouth daily.    [provider]  aspirin EC 81 MG tablet Take 81 mg by mouth daily.    [provider]  Chlorpheniramine Maleate (ALLERGY PO) Take 1 tablet by mouth daily as needed (allergies). Reported on 02/19/2016    [provider]  diclofenac sodium (VOLTAREN) 1 % GEL Apply 2 g topically 4 (four) times daily. 08/11/17   Evalee Jefferson, PA-C  HYDROcodone-acetaminophen (NORCO) 7.5-325 MG tablet Take 1 tablet by mouth every 6 (six) hours as needed for moderate pain. 06/21/18   Sanjuana Kava, MD  predniSONE (DELTASONE) 20 MG tablet Take 2 tablets (40 mg total) by mouth daily for 5 days. 06/25/18 06/30/18  Valon Glasscock S, PA-C    Family History Family History  Problem Relation Age of Onset  . Esophageal cancer Father   . Colon cancer Neg Hx     Social History Social History   Tobacco Use  . Smoking status: Current Every  Day Smoker    Packs/day: 0.25    Years: 50.00    Pack years: 12.50    Types: Cigarettes  . Smokeless tobacco: Never Used  . Tobacco comment: 5 cigarettes per day  Substance Use Topics  . Alcohol use: Yes    Alcohol/week: 5.0 standard drinks    Types: 5 Cans of beer per week    Comment: 3-4 beers/day  . Drug use: No   Allergies   Aleve [naproxen]; Celecoxib; and Fluoxetine hcl   Review of Systems Review of Systems  Constitutional: Negative for chills and fever.  Eyes: Negative for visual disturbance.  Musculoskeletal:       Left foot and ankle pain  Skin: Negative for wound.  Neurological: Negative for weakness.     Physical Exam Updated Vital Signs BP (!) 145/78  (BP Location: Left Arm)   Pulse 88   Temp 98.2 F (36.8 C) (Oral)   Resp 20   Ht 6\' 6"  (1.981 m)   Wt 100.7 kg   SpO2 97%   BMI 25.65 kg/m   Physical Exam  Constitutional: He is oriented to person, place, and time. He appears well-developed and well-nourished. No distress.  Eyes: Conjunctivae are normal.  Cardiovascular: Normal rate and regular rhythm.  dp pulses nonpalpable, but able to be dopplered. Cap refill is brisk and extremities are warm.   Pulmonary/Chest: Effort normal and breath sounds normal.  Musculoskeletal:  TTP to the medial and lateral malleolus, with swelling over the lateral malleolus. TTP diffusely throughout the foot. Dorsiflexion/plantarflexion is intact. Able to wiggle toes bilaterally. No obvious deformity.  Neurological: He is alert and oriented to person, place, and time.  Skin: Skin is warm and dry. Capillary refill takes less than 2 seconds.  Psychiatric: He has a normal mood and affect.    ED Treatments / Results  Labs (all labs ordered are listed, but only abnormal results are displayed) Labs Reviewed - No data to display  EKG None  Radiology Dg Ankle Complete Left  Result Date: 06/24/2018 CLINICAL DATA:  Pain EXAM: LEFT ANKLE COMPLETE - 3+ VIEW COMPARISON:  None. FINDINGS: Unusual contour to the posterior tibia. No acute fracture. No soft tissue swelling. The ankle mortise is intact. IMPRESSION: Unusual contour to the posterior tibia has a chronic appearance suggesting the possibility previous remote trauma or a developmental variant. This is of doubtful acute significance. Electronically Signed   By: Dorise Bullion III M.D   On: 06/24/2018 23:29   Dg Foot Complete Left  Result Date: 06/24/2018 CLINICAL DATA:  Sudden pain. EXAM: LEFT FOOT - COMPLETE 3+ VIEW COMPARISON:  None. FINDINGS: There is no evidence of fracture or dislocation. There is no evidence of arthropathy or other focal bone abnormality. Soft tissues are unremarkable. IMPRESSION:  Negative. Electronically Signed   By: Dorise Bullion III M.D   On: 06/24/2018 23:27    Procedures Procedures (including critical care time)  Medications Ordered in ED Medications  HYDROcodone-acetaminophen (NORCO/VICODIN) 5-325 MG per tablet 1 tablet (1 tablet Oral Given 06/24/18 2327)     Initial Impression / Assessment and Plan / ED Course  I have reviewed the triage vital signs and the nursing notes.  Pertinent labs & imaging results that were available during my care of the Larry Reese were reviewed by me and considered in my medical decision making (see chart for details).    Discussed pt presentation and exam findings with Dr. Christy Gentles, who evaluated pt and agrees with the plan to d/c pt with  close f/u with vascular surgery as sxs may be related to his h/o PAD.   Nursing update. Pt has +pedal pulses bilaterally with doppler.  Final Clinical Impressions(s) / ED Diagnoses   Final diagnoses:  Left foot pain  Acute left ankle pain   Larry Reese presenting with left foot and ankle pain that began earlier today.  Denies any known history of trauma or falls.  No fevers or chills.  Afebrile here with normal vital signs.  Mild tenderness throughout the ankle and foot with some mild swelling to the ankle.  Pulses able to be dopplered.  Brisk cap refill and distal extremities are warm bilaterally.  X-ray left foot and ankle are negative for acute fracture or bony abnormality.  Suspect symptoms could be due to arthritis, versus gout, versus pain secondary to his PAD.  Low suspicion for septic arthritis.  Have advised Larry Reese to contact his vascular surgery office and inform them of his visit to the ED.  We will also give short course of steroids to cover potential inflammatory diagnoses.  Have given Larry Reese strict return precautions for any new or worsening symptoms in the meantime.  He and his wife at bedside voiced understanding the plan and reasons to return immediately to the ED.  All questions  answered.  ED Discharge Orders         Ordered    predniSONE (DELTASONE) 20 MG tablet  Daily     06/25/18 0018           Sergio Zawislak S, PA-C 06/25/18 0020    Braileigh Landenberger S, PA-C 06/25/18 0047    Ripley Fraise, MD 06/25/18 [phone]

## 2018-06-24 NOTE — ED Notes (Signed)
Pt got up from recliner at home around 2000 tonight, pt with sudden pain that started to left thigh and pain radiates down to left foot. Pain is constant to foot.

## 2018-06-24 NOTE — ED Triage Notes (Signed)
Pt states his L leg has been aching all day from L. Hip all the way down leg.

## 2018-06-24 NOTE — ED Provider Notes (Signed)
Patient seen/examined in the Emergency Department in conjunction with Midlevel Provider  Patient reports left ankle/foot pain, no trauma, no discoloration, no weakness reported Exam : awake/alert, feet are warm to touch.  No deformity, ?mild swelling left ankle.  No erythema.  No crepitus.   Plan: he has h/o PAD, will ensure he has appropriate circulation.  No signs of acute arterial occlusion.  Will control pain and reassess.  He may need outpatient visit with vascular.      Ripley Fraise, MD 06/24/18 [id number]

## 2018-06-25 MED ORDER — PREDNISONE 20 MG PO TABS: 40.0000 mg | ORAL_TABLET | Freq: Every day | ORAL | 0 refills | Status: AC

## 2018-06-25 NOTE — ED Notes (Signed)
Positive pedal pulses bilaterally via doppler

## 2018-06-25 NOTE — Discharge Instructions (Signed)
Take medications as prescribed.  Call your vascular surgeon on Monday, 06/26/2018 and make an appointment for follow-up.  Return to the emergency department for any fevers, chills, redness, warmth or increased swelling to the joint.  Also return for any increased pain or discoloration to the left foot.

## 2018-06-28 ENCOUNTER — Telehealth: Payer: Self-pay | Admitting: Orthopaedic Surgery

## 2018-06-28 NOTE — Telephone Encounter (Signed)
Patient requests refill on Hydrocodone/Acetaminophen 7.5-325  Mgs.  Qty  25  Sig: Take 1 tablet by mouth every 6 (six) hours as needed for moderate pain.  Patient states he uses [facility]

## 2018-06-29 ENCOUNTER — Encounter: Payer: Self-pay | Admitting: Gastroenterology

## 2018-06-29 MED ORDER — HYDROCODONE-ACETAMINOPHEN 7.5-325 MG PO TABS: 1.0000 | ORAL_TABLET | Freq: Four times a day (QID) | ORAL | 0 refills | Status: DC | PRN

## 2018-07-03 DIAGNOSIS — M79675 Pain in left toe(s): Secondary | ICD-10-CM | POA: Diagnosis not present

## 2018-07-03 DIAGNOSIS — B351 Tinea unguium: Secondary | ICD-10-CM | POA: Diagnosis not present

## 2018-07-03 DIAGNOSIS — L851 Acquired keratosis [keratoderma] palmaris et plantaris: Secondary | ICD-10-CM | POA: Diagnosis not present

## 2018-07-03 DIAGNOSIS — I739 Peripheral vascular disease, unspecified: Secondary | ICD-10-CM | POA: Diagnosis not present

## 2018-07-03 DIAGNOSIS — M79674 Pain in right toe(s): Secondary | ICD-10-CM | POA: Diagnosis not present

## 2018-07-05 ENCOUNTER — Telehealth: Payer: Self-pay | Admitting: Orthopaedic Surgery

## 2018-07-05 MED ORDER — HYDROCODONE-ACETAMINOPHEN 7.5-325 MG PO TABS: 1.0000 | ORAL_TABLET | Freq: Four times a day (QID) | ORAL | 0 refills | Status: DC | PRN

## 2018-07-05 NOTE — Telephone Encounter (Signed)
Hydrocodone-Acetaminophen  7.5/325 mg  Qty 25 Tablets ° °PATIENT USES [facility] °

## 2018-07-12 ENCOUNTER — Other Ambulatory Visit: Payer: Self-pay | Admitting: Orthopaedic Surgery

## 2018-07-12 NOTE — Telephone Encounter (Signed)
Patient of Dr Brooke Bonito requests refill on Hydrocodone/Acetaminophen 7.5-325  Mgs.  Qty  25       Sig: Take 1 tablet by mouth every 6 (six) hours as needed for moderate pain.     Patient states he uses [facility]

## 2018-07-13 MED ORDER — HYDROCODONE-ACETAMINOPHEN 7.5-325 MG PO TABS: 1.0000 | ORAL_TABLET | Freq: Four times a day (QID) | ORAL | 0 refills | Status: DC | PRN

## 2018-07-17 ENCOUNTER — Other Ambulatory Visit: Payer: Self-pay | Admitting: Orthopaedic Surgery

## 2018-07-17 MED ORDER — HYDROCODONE-ACETAMINOPHEN 7.5-325 MG PO TABS: 1.0000 | ORAL_TABLET | Freq: Four times a day (QID) | ORAL | 0 refills | Status: DC | PRN

## 2018-07-17 NOTE — Telephone Encounter (Signed)
Dr. Brooke Bonito pt. Request was sent on 10/24, but shows pharmacy transmission failed.  Hydrocodone-Acetaminophen  7.5/325 mg  Qty 25 Tablets  Take 1 tablet by mouth every 6 (six) hours as needed for moderate pain.  PATIENT USES [facility]

## 2018-07-27 ENCOUNTER — Telehealth: Payer: Self-pay | Admitting: Orthopaedic Surgery

## 2018-07-27 MED ORDER — HYDROCODONE-ACETAMINOPHEN 7.5-325 MG PO TABS: 1.0000 | ORAL_TABLET | Freq: Four times a day (QID) | ORAL | 0 refills | Status: DC | PRN

## 2018-07-27 NOTE — Telephone Encounter (Signed)
Hydrocodone-Acetaminophen  7.5/325 mg  Qty 25 Tablets  PATIENT USES  PHARMACY

## 2018-08-02 ENCOUNTER — Telehealth: Payer: Self-pay | Admitting: Orthopaedic Surgery

## 2018-08-02 MED ORDER — HYDROCODONE-ACETAMINOPHEN 7.5-325 MG PO TABS: 1.0000 | ORAL_TABLET | Freq: Four times a day (QID) | ORAL | 0 refills | Status: DC | PRN

## 2018-08-02 NOTE — Telephone Encounter (Signed)
Patient called for refill:  HYDROcodone-acetaminophen ([place]) 7.5-325 MG tablet 24 tablet  -[place]   - aware of appointment next week, 08/10/18

## 2018-08-08 DIAGNOSIS — I10 Essential (primary) hypertension: Secondary | ICD-10-CM | POA: Diagnosis not present

## 2018-08-08 DIAGNOSIS — Z23 Encounter for immunization: Secondary | ICD-10-CM | POA: Diagnosis not present

## 2018-08-08 DIAGNOSIS — M549 Dorsalgia, unspecified: Secondary | ICD-10-CM | POA: Diagnosis not present

## 2018-08-08 DIAGNOSIS — I739 Peripheral vascular disease, unspecified: Secondary | ICD-10-CM | POA: Diagnosis not present

## 2018-08-10 ENCOUNTER — Ambulatory Visit (INDEPENDENT_AMBULATORY_CARE_PROVIDER_SITE_OTHER): Payer: PPO

## 2018-08-10 ENCOUNTER — Encounter: Payer: Self-pay | Admitting: Orthopaedic Surgery

## 2018-08-10 ENCOUNTER — Ambulatory Visit: Payer: PPO | Admitting: Orthopaedic Surgery

## 2018-08-10 VITALS — BP 154/96 | HR 85 | Ht 78.0 in | Wt 210.0 lb

## 2018-08-10 DIAGNOSIS — M25512 Pain in left shoulder: Secondary | ICD-10-CM | POA: Diagnosis not present

## 2018-08-10 DIAGNOSIS — G8929 Other chronic pain: Secondary | ICD-10-CM

## 2018-08-10 DIAGNOSIS — F17210 Nicotine dependence, cigarettes, uncomplicated: Secondary | ICD-10-CM | POA: Diagnosis not present

## 2018-08-10 DIAGNOSIS — M5441 Lumbago with sciatica, right side: Secondary | ICD-10-CM | POA: Diagnosis not present

## 2018-08-10 DIAGNOSIS — R29898 Other symptoms and signs involving the musculoskeletal system: Secondary | ICD-10-CM | POA: Diagnosis not present

## 2018-08-10 MED ORDER — HYDROCODONE-ACETAMINOPHEN 7.5-325 MG PO TABS: 1.0000 | ORAL_TABLET | Freq: Four times a day (QID) | ORAL | 0 refills | Status: DC | PRN

## 2018-08-10 NOTE — Patient Instructions (Signed)
Steps to Quit Smoking Smoking tobacco can be bad for your health. It can also affect almost every organ in your body. Smoking puts you and people around you at risk for many serious long-lasting (chronic) diseases. Quitting smoking is hard, but it is one of the best things that you can do for your health. It is never too late to quit. What are the benefits of quitting smoking? When you quit smoking, you lower your risk for getting serious diseases and conditions. They can include:  Lung cancer or lung disease.  Heart disease.  Stroke.  Heart attack.  Not being able to have children (infertility).  Weak bones (osteoporosis) and broken bones (fractures).  If you have coughing, wheezing, and shortness of breath, those symptoms may get better when you quit. You may also get sick less often. If you are pregnant, quitting smoking can help to lower your chances of having a baby of low birth weight. What can I do to help me quit smoking? Talk with your doctor about what can help you quit smoking. Some things you can do (strategies) include:  Quitting smoking totally, instead of slowly cutting back how much you smoke over a period of time.  Going to in-person counseling. You are more likely to quit if you go to many counseling sessions.  Using resources and support systems, such as: ? Online chats with a counselor. ? Phone quitlines. ? Printed self-help materials. ? Support groups or group counseling. ? Text messaging programs. ? Mobile phone apps or applications.  Taking medicines. Some of these medicines may have nicotine in them. If you are pregnant or breastfeeding, do not take any medicines to quit smoking unless your doctor says it is okay. Talk with your doctor about counseling or other things that can help you.  Talk with your doctor about using more than one strategy at the same time, such as taking medicines while you are also going to in-person counseling. This can help make  quitting easier. What things can I do to make it easier to quit? Quitting smoking might feel very hard at first, but there is a lot that you can do to make it easier. Take these steps:  Talk to your family and friends. Ask them to support and encourage you.  Call phone quitlines, reach out to support groups, or work with a counselor.  Ask people who smoke to not smoke around you.  Avoid places that make you want (trigger) to smoke, such as: ? Bars. ? Parties. ? Smoke-break areas at work.  Spend time with people who do not smoke.  Lower the stress in your life. Stress can make you want to smoke. Try these things to help your stress: ? Getting regular exercise. ? Deep-breathing exercises. ? Yoga. ? Meditating. ? Doing a body scan. To do this, close your eyes, focus on one area of your body at a time from head to toe, and notice which parts of your body are tense. Try to relax the muscles in those areas.  Download or buy apps on your mobile phone or tablet that can help you stick to your quit plan. There are many free apps, such as QuitGuide from the CDC (Centers for Disease Control and Prevention). You can find more support from smokefree.gov and other websites.  This information is not intended to replace advice given to you by your health care provider. Make sure you discuss any questions you have with your health care provider. Document Released: 07/03/2009 Document   Revised: 05/04/2016 Document Reviewed: 01/21/2015 Elsevier Interactive Patient Education  2018 Elsevier Inc.  

## 2018-08-10 NOTE — Progress Notes (Signed)
[name], male DOB:01-17-1946, 72 y.o. EYC:[id number]  Chief Complaint  Patient presents with  . Shoulder Pain    left    HPI  Larry Reese is a 72 y.o. male who has chronic left shoulder pain.  His shoulder is stable.  He has more pain with overhead use and cold weather.  He has no new trauma.  He has developed weakness of the right thigh and has trembling feeling of the thigh at times.  He has fallen twice when his thigh got weak and he lost his balance.  He has no redness, no paresthesias.  He has no back pain.  He is having some difficulty in climbing steps now as the thigh gets weak at times.  He has no problem on the left side.  He is taking pain medicine for his shoulder.   Body mass index is 24.27 kg/m.  ROS  Review of Systems  HENT: Negative for congestion.   Respiratory: Positive for cough. Negative for shortness of breath.   Cardiovascular: Negative for chest pain.  Endocrine: Positive for cold intolerance.  Musculoskeletal: Positive for arthralgias and myalgias.  Allergic/Immunologic: Negative for environmental allergies.  All other systems reviewed and are negative.   All other systems reviewed and are negative.  The following is a summary of the past history medically, past history surgically, known current medicines, social history and family history.  This information is gathered electronically by the computer from prior information and documentation.  I review this each visit and have found including this information at this point in the chart is beneficial and informative.    Past Medical History:  Diagnosis Date  . Arthritis   . Hx of adenomatous colonic polyps   . Hypertension   . Pneumothorax   . Stroke [facility]) 2005   left sided weakness  . Trigger finger     Past Surgical History:  Procedure Laterality Date  . COLONOSCOPY  May 2010   Dr. fields: 5 cm terminal ileum normal, 6 polyps removed, moderate internal hemorrhoids,  simple adenomas, next colonoscopy May 2015  . COLONOSCOPY N/A 08/01/2015   Procedure: COLONOSCOPY;  Surgeon: Danie Binder, MD;  Location[facility];  Service: Endoscopy;  Laterality: N/A;  0830  . CRANIOTOMY  2000   pressure  . INGUINAL HERNIA REPAIR Right   . OLECRANON BURSECTOMY Right 02/11/2015   Procedure: EXCISION RIGHT OLECRANON BURSA;  Surgeon: Sanjuana Kava, MD;  Location: [facility] ORS;  Service: Orthopedics;  Laterality: Right;  . repair of trigger finger Left    pinky    Family History  Problem Relation Age of Onset  . Esophageal cancer Father   . Colon cancer Neg Hx     Social History Social History   Tobacco Use  . Smoking status: Current Every Day Smoker    Packs/day: 0.25    Years: 50.00    Pack years: 12.50    Types: Cigarettes  . Smokeless tobacco: Never Used  . Tobacco comment: 5 cigarettes per day  Substance Use Topics  . Alcohol use: Yes    Alcohol/week: 5.0 standard drinks    Types: 5 Cans of beer per week    Comment: 3-4 beers/day  . Drug use: No    Allergies  Allergen Reactions  . Aleve [Naproxen] Other (See Comments)    Numbness in extremities   . Celecoxib Other (See Comments)    Celebrex. Nose bleed.  . Fluoxetine Hcl Swelling    Current Outpatient Medications  Medication Sig Dispense Refill  . ALPRAZolam (XANAX) 0.5 MG tablet Take 0.5 mg by mouth at bedtime as needed for sleep. Reported on 02/19/2016    . amLODipine (NORVASC) 10 MG tablet Take 10 mg by mouth daily.    Marland Kitchen aspirin EC 81 MG tablet Take 81 mg by mouth daily.    . Chlorpheniramine Maleate (ALLERGY PO) Take 1 tablet by mouth daily as needed (allergies). Reported on 02/19/2016    . diclofenac sodium (VOLTAREN) 1 % GEL Apply 2 g topically 4 (four) times daily. 100 g 0  . HYDROcodone-acetaminophen (NORCO) 7.5-325 MG tablet Take 1 tablet by mouth every 6 (six) hours as needed for moderate pain. 24 tablet 0  . tamsulosin (FLOMAX) 0.4 MG CAPS capsule      No current  facility-administered medications for this visit.      Physical Exam  Blood pressure (!) 154/96, pulse 85, height 6\' 6"  (1.981 m), weight 210 lb (95.3 kg).  Constitutional: overall normal hygiene, normal nutrition, well developed, normal grooming, normal body habitus. Assistive device:none  Musculoskeletal: gait and station Limp none, muscle tone and strength are normal, no tremors or atrophy is present.  .  Neurological: coordination overall normal.  Deep tendon reflex/nerve stretch intact.  Sensation normal.  Cranial nerves II-XII intact.   Skin:   Normal overall no scars, lesions, ulcers or rashes. No psoriasis.  Psychiatric: Alert and oriented x 3.  Recent memory intact, remote memory unclear.  Normal mood and affect. Well groomed.  Good eye contact.  Cardiovascular: overall no swelling, no varicosities, no edema bilaterally, normal temperatures of the legs and arms, no clubbing, cyanosis and good capillary refill.  Lymphatic: palpation is normal.  Spine/Pelvis examination:  Inspection:  Overall, sacoiliac joint benign and hips nontender; without crepitus or defects.   Thoracic spine inspection: Alignment normal without kyphosis present   Lumbar spine inspection:  Alignment  with normal lumbar lordosis, without scoliosis apparent.   Thoracic spine palpation:  without tenderness of spinal processes   Lumbar spine palpation: without tenderness of lumbar area; without tightness of lumbar muscles    Range of Motion:   Lumbar flexion, forward flexion is normal without pain or tenderness    Lumbar extension is full without pain or tenderness   Left lateral bend is normal without pain or tenderness   Right lateral bend is normal without pain or tenderness   Straight leg raising is normal  Strength & tone: normal   Stability overall normal stability Examination of left Upper Extremity is done.  Inspection:   Overall:  Elbow non-tender without crepitus or defects, forearm  non-tender without crepitus or defects, wrist non-tender without crepitus or defects, hand non-tender.    Shoulder: with glenohumeral joint tenderness, without effusion.   Upper arm: without swelling and tenderness   Range of motion:   Overall:  Full range of motion of the elbow, full range of motion of wrist and full range of motion in fingers.   Shoulder:  left  165  degrees forward flexion; 150 degrees abduction; 35 degrees internal rotation, 35 degrees external rotation, 15 degrees extension, 40 degrees adduction.   Stability:   Overall:  Shoulder, elbow and wrist stable   Strength and Tone:   Overall full shoulder muscles strength, full upper arm strength and normal upper arm bulk and tone.  All other systems reviewed and are negative   The patient has been educated about the nature of the problem(s) and counseled on treatment options.  The patient  appeared to understand what I have discussed and is in agreement with it.  Encounter Diagnoses  Name Primary?  . Right leg weakness Yes  . Chronic right-sided low back pain with right-sided sciatica   . Chronic left shoulder pain   . Cigarette nicotine dependence without complication    X-rays were done of the lumbar spine, reported separately.  DJD present lower. PLAN Call if any problems.  Precautions discussed.  Continue current medications.   Return to clinic Get MRI of the lumbar spine to rule out HNP    The patient has read and signed an Opioid Treatment Agreement which has been scanned and added to the medical record.  The patient understands the agreement and agrees to abide with it.  The patient has chronic pain that is being treated with an opioid which relieves the pain.  The patient understands potential complications with chronic opioid treatment.   I have reviewed the [place] web site prior to prescribing narcotic medicine for this patient.   Electronically  Signed Sanjuana Kava, MD 11/21/201910:31 AM

## 2018-08-16 ENCOUNTER — Other Ambulatory Visit: Payer: Self-pay | Admitting: Orthopaedic Surgery

## 2018-08-16 MED ORDER — HYDROCODONE-ACETAMINOPHEN 7.5-325 MG PO TABS: 1.0000 | ORAL_TABLET | Freq: Four times a day (QID) | ORAL | 0 refills | Status: DC | PRN

## 2018-08-16 NOTE — Telephone Encounter (Signed)
Dr. Brooke Bonito patient  Hydrocodone-Acetaminophen  7.5/325 mg  Qty 24 Tablets Take 1 tablet by mouth every 6 (six) hours as needed for moderate pain.  PATIENT USES [facility]

## 2018-08-18 ENCOUNTER — Ambulatory Visit (HOSPITAL_COMMUNITY)
Admission: RE | Admit: 2018-08-18 | Discharge: 2018-08-18 | Disposition: A | Discharge status: Home / Self Care | Payer: PPO | Source: Ambulatory Visit | Attending: Orthopaedic Surgery | Admitting: Orthopaedic Surgery

## 2018-08-18 DIAGNOSIS — M25512 Pain in left shoulder: Secondary | ICD-10-CM | POA: Insufficient documentation

## 2018-08-18 DIAGNOSIS — M2578 Osteophyte, vertebrae: Secondary | ICD-10-CM | POA: Diagnosis not present

## 2018-08-18 DIAGNOSIS — M5126 Other intervertebral disc displacement, lumbar region: Secondary | ICD-10-CM | POA: Insufficient documentation

## 2018-08-18 DIAGNOSIS — G8929 Other chronic pain: Secondary | ICD-10-CM | POA: Insufficient documentation

## 2018-08-18 DIAGNOSIS — M5441 Lumbago with sciatica, right side: Secondary | ICD-10-CM | POA: Diagnosis not present

## 2018-08-18 DIAGNOSIS — M48061 Spinal stenosis, lumbar region without neurogenic claudication: Secondary | ICD-10-CM | POA: Insufficient documentation

## 2018-08-18 DIAGNOSIS — R29898 Other symptoms and signs involving the musculoskeletal system: Secondary | ICD-10-CM | POA: Diagnosis not present

## 2018-08-18 DIAGNOSIS — M47816 Spondylosis without myelopathy or radiculopathy, lumbar region: Secondary | ICD-10-CM | POA: Diagnosis not present

## 2018-08-22 ENCOUNTER — Ambulatory Visit: Payer: Self-pay | Admitting: Orthopaedic Surgery

## 2018-08-22 ENCOUNTER — Encounter: Payer: Self-pay | Admitting: Orthopaedic Surgery

## 2018-08-24 ENCOUNTER — Encounter: Payer: Self-pay | Admitting: Orthopaedic Surgery

## 2018-08-24 ENCOUNTER — Ambulatory Visit: Payer: PPO | Admitting: Orthopaedic Surgery

## 2018-08-24 VITALS — BP 156/84 | HR 80 | Ht 78.0 in | Wt 206.0 lb

## 2018-08-24 DIAGNOSIS — R29898 Other symptoms and signs involving the musculoskeletal system: Secondary | ICD-10-CM | POA: Diagnosis not present

## 2018-08-24 DIAGNOSIS — M5441 Lumbago with sciatica, right side: Secondary | ICD-10-CM

## 2018-08-24 DIAGNOSIS — G8929 Other chronic pain: Secondary | ICD-10-CM

## 2018-08-24 MED ORDER — HYDROCODONE-ACETAMINOPHEN 7.5-325 MG PO TABS: 1.0000 | ORAL_TABLET | Freq: Four times a day (QID) | ORAL | 0 refills | Status: DC | PRN

## 2018-08-24 NOTE — Progress Notes (Signed)
[name], male DOB:20-Nov-1945, 72 y.o. OZY:[id number]  Chief Complaint  Patient presents with  . Back Pain    HPI  Larry Reese is a 72 y.o. male who has right sided sciatica and pain that is getting worse.  He had a MRI which showed: IMPRESSION: 1. Progressive multilevel degenerative changes of the lumbar spine as described above. New left paracentral disc extrusion at L3-L4 with displacement and likely impingement of the descending left L4 nerve root. 2. Large circumferential disc osteophyte complex at L5-S1 encroaching on both exiting L5 nerve roots. Worsened now moderate right neuroforaminal stenosis and improved now moderate left neuroforaminal stenosis. 3. Right-sided disc osteophyte complex at L4-L5 with unchanged encroachment on the descending right L5 nerve root within the lateral recess.  I have explained the findings to him and his wife.  I will have a neurosurgeon see him.  They are agreeable to this.   Body mass index is 23.81 kg/m.  ROS  Review of Systems  All other systems reviewed and are negative.  The following is a summary of the past history medically, past history surgically, known current medicines, social history and family history.  This information is gathered electronically by the computer from prior information and documentation.  I review this each visit and have found including this information at this point in the chart is beneficial and informative.    Past Medical History:  Diagnosis Date  . Arthritis   . Hx of adenomatous colonic polyps   . Hypertension   . Pneumothorax   . Stroke [facility]) 2005   left sided weakness  . Trigger finger     Past Surgical History:  Procedure Laterality Date  . COLONOSCOPY  May 2010   Dr. fields: 5 cm terminal ileum normal, 6 polyps removed, moderate internal hemorrhoids, simple adenomas, next colonoscopy May 2015  . COLONOSCOPY N/A 08/01/2015   Procedure: COLONOSCOPY;  Surgeon:  Danie Binder, MD;  Location[facility];  Service: Endoscopy;  Laterality: N/A;  0830  . CRANIOTOMY  2000   pressure  . INGUINAL HERNIA REPAIR Right   . OLECRANON BURSECTOMY Right 02/11/2015   Procedure: EXCISION RIGHT OLECRANON BURSA;  Surgeon: Sanjuana Kava, MD;  Location: [facility];  Service: Orthopedics;  Laterality: Right;  . repair of trigger finger Left    pinky    Family History  Problem Relation Age of Onset  . Esophageal cancer Father   . Colon cancer Neg Hx     Social History Social History   Tobacco Use  . Smoking status: Current Every Day Smoker    Packs/day: 0.25    Years: 50.00    Pack years: 12.50    Types: Cigarettes  . Smokeless tobacco: Never Used  . Tobacco comment: 5 cigarettes per day  Substance Use Topics  . Alcohol use: Yes    Alcohol/week: 5.0 standard drinks    Types: 5 Cans of beer per week    Comment: 3-4 beers/day  . Drug use: No    Allergies  Allergen Reactions  . Aleve [Naproxen] Other (See Comments)    Numbness in extremities   . Celecoxib Other (See Comments)    Celebrex. Nose bleed.  . Fluoxetine Hcl Swelling    Current Outpatient Medications  Medication Sig Dispense Refill  . ALPRAZolam (XANAX) 0.5 MG tablet Take 0.5 mg by mouth at bedtime as needed for sleep. Reported on 02/19/2016    . amLODipine (NORVASC) 10 MG tablet Take 10 mg by mouth  daily.    . aspirin EC 81 MG tablet Take 81 mg by mouth daily.    . Chlorpheniramine Maleate (ALLERGY PO) Take 1 tablet by mouth daily as needed (allergies). Reported on 02/19/2016    . diclofenac sodium (VOLTAREN) 1 % GEL Apply 2 g topically 4 (four) times daily. 100 g 0  . tamsulosin (FLOMAX) 0.4 MG CAPS capsule     . HYDROcodone-acetaminophen (NORCO) 7.5-325 MG tablet Take 1 tablet by mouth every 6 (six) hours as needed for moderate pain. 24 tablet 0   No current facility-administered medications for this visit.      Physical Exam  Blood pressure (!) 156/84, pulse 80, height 6\' 6"   (1.981 m), weight 206 lb (93.4 kg).  Constitutional: overall normal hygiene, normal nutrition, well developed, normal grooming, normal body habitus. Assistive device:none  Musculoskeletal: gait and station Limp none, muscle tone and strength are normal, no tremors or atrophy is present.  .  Neurological: coordination overall normal.  Deep tendon reflex/nerve stretch intact.  Sensation normal.  Cranial nerves II-XII intact.   Skin:   Normal overall no scars, lesions, ulcers or rashes. No psoriasis.  Psychiatric: Alert and oriented x 3.  Recent memory intact, remote memory unclear.  Normal mood and affect. Well groomed.  Good eye contact.  Cardiovascular: overall no swelling, no varicosities, no edema bilaterally, normal temperatures of the legs and arms, no clubbing, cyanosis and good capillary refill.  Lymphatic: palpation is normal.  Spine/Pelvis examination:  Inspection:  Overall, sacoiliac joint benign and hips nontender; without crepitus or defects.   Thoracic spine inspection: Alignment normal without kyphosis present   Lumbar spine inspection:  Alignment  with normal lumbar lordosis, without scoliosis apparent.   Thoracic spine palpation:  without tenderness of spinal processes   Lumbar spine palpation: with tenderness of lumbar area; with tightness of lumbar muscles    Range of Motion:   Lumbar flexion, forward flexion is normal with pain and tenderness    Lumbar extension is full without pain or tenderness   Left lateral bend is normal without pain or tenderness   Right lateral bend is normal without pain or tenderness   Straight leg raising is normal  Strength & tone: normal   Stability overall normal stability  All other systems reviewed and are negative   The patient has been educated about the nature of the problem(s) and counseled on treatment options.  The patient appeared to understand what I have discussed and is in agreement with it.  Encounter Diagnoses   Name Primary?  . Chronic right-sided low back pain with right-sided sciatica Yes  . Right leg weakness     PLAN Call if any problems.  Precautions discussed.  Continue current medications.   Return to clinic to see neurosurgeon for his back,  I will see in three months for shoulder   I have reviewed the [place] web site prior to prescribing narcotic medicine for this patient.   Electronically Signed Sanjuana Kava, MD 12/5/20199:52 AM

## 2018-08-30 ENCOUNTER — Telehealth: Payer: Self-pay | Admitting: Orthopaedic Surgery

## 2018-08-30 MED ORDER — HYDROCODONE-ACETAMINOPHEN 7.5-325 MG PO TABS: 1.0000 | ORAL_TABLET | Freq: Four times a day (QID) | ORAL | 0 refills | Status: DC | PRN

## 2018-08-30 NOTE — Telephone Encounter (Signed)
Patient called for refill:  HYDROcodone-acetaminophen ([place]) 7.5-325 MG tablet 24 tablet  -[place]

## 2018-09-06 ENCOUNTER — Telehealth: Payer: Self-pay | Admitting: Orthopaedic Surgery

## 2018-09-06 MED ORDER — HYDROCODONE-ACETAMINOPHEN 7.5-325 MG PO TABS: 1.0000 | ORAL_TABLET | Freq: Four times a day (QID) | ORAL | 0 refills | Status: DC | PRN

## 2018-09-06 NOTE — Telephone Encounter (Signed)
Patient called for refill: HYDROcodone-acetaminophen ([place]) 7.5-325 MG tablet 24 tablet  [employer]  - relayed Drs will be out of clinic next week during holiday

## 2018-09-18 DIAGNOSIS — M7918 Myalgia, other site: Secondary | ICD-10-CM | POA: Diagnosis not present

## 2018-09-18 DIAGNOSIS — M25552 Pain in left hip: Secondary | ICD-10-CM | POA: Diagnosis not present

## 2018-09-18 DIAGNOSIS — I10 Essential (primary) hypertension: Secondary | ICD-10-CM | POA: Diagnosis not present

## 2018-09-18 DIAGNOSIS — Z6826 Body mass index (BMI) 26.0-26.9, adult: Secondary | ICD-10-CM | POA: Diagnosis not present

## 2018-10-05 ENCOUNTER — Ambulatory Visit (INDEPENDENT_AMBULATORY_CARE_PROVIDER_SITE_OTHER): Payer: PPO | Admitting: Orthopaedic Surgery

## 2018-10-05 ENCOUNTER — Encounter (INDEPENDENT_AMBULATORY_CARE_PROVIDER_SITE_OTHER): Payer: Self-pay | Admitting: Orthopaedic Surgery

## 2018-10-05 ENCOUNTER — Telehealth: Payer: Self-pay | Admitting: Orthopaedic Surgery

## 2018-10-05 DIAGNOSIS — M87051 Idiopathic aseptic necrosis of right femur: Secondary | ICD-10-CM

## 2018-10-05 MED ORDER — LIDOCAINE HCL 1 % IJ SOLN: 8.0000 mL | Freq: Once | INTRAMUSCULAR | Status: DC

## 2018-10-05 MED ORDER — METHYLPREDNISOLONE ACETATE 40 MG/ML IJ SUSP: 40.0000 mg | Freq: Once | INTRAMUSCULAR | Status: DC

## 2018-10-05 NOTE — Progress Notes (Signed)
Subjective: He is here for ultrasound-guided right hip injection.  He has avascular necrosis changes on x-ray.  Objective: He has slightly decreased range of motion with internal hip rotation as well as pain in the groin area.  Procedure: Ultrasound-guided right hip injection: After sterile prep with Betadine, injected 8 cc 1% lidocaine without epinephrine and 40 mg methylprednisolone using a 22-gauge spinal needle passing the needle through the iliofemoral ligament into the femoral head/neck junction.  Injectate was seen filling the joint capsule.  He had some improvement during the immediate anesthetic phase.

## 2018-10-05 NOTE — Progress Notes (Signed)
Office Visit Note   Patient: Larry Reese           Date of Birth: 14-Feb-1946           MRN: [id number] Visit Date: 10/05/2018              Requested by: [name], MD [address] [place], [place] [address] PCP: [name], MD   Assessment & Plan: Visit Diagnoses:  1. Avascular necrosis of bone of right hip (HCC)     Plan: Impression is right hip avascular necrosis with secondary degenerative joint disease.  These findings were discussed with the patient.  We discussed surgical versus nonsurgical treatments.  He would like to try cortisone injection today which we arranged with Dr. Junius Roads.  Questions encouraged and answered.  Follow-up as needed.  Follow-Up Instructions: Return if symptoms worsen or fail to improve.   Orders:  No orders of the defined types were placed in this encounter.  No orders of the defined types were placed in this encounter.     Procedures: No procedures performed   Clinical Data: No additional findings.   Subjective: Chief Complaint  Patient presents with  . Left Leg - Pain    Mr. Larry Reese is a 73 year old gentleman who comes in with mainly right hip and thigh pain that he rates as 7-8 out of 10.  He does have a previous history of alcoholism.  He is a retired gentleman.  He denies any numbness and tingling.  He walks with a slight limp.   Review of Systems  Constitutional: Negative.   All other systems reviewed and are negative.    Objective: Vital Signs: There were no vitals taken for this visit.  Physical Exam Vitals signs and nursing note reviewed.  Constitutional:      Appearance: He is well-developed.  HENT:     Head: Normocephalic and atraumatic.  Eyes:     Pupils: Pupils are equal, round, and reactive to light.  Neck:     Musculoskeletal: Neck supple.  Pulmonary:     Effort: Pulmonary effort is normal.  Abdominal:     Palpations: Abdomen is soft.  Musculoskeletal: Normal range of motion.    Skin:    General: Skin is warm.  Neurological:     Mental Status: He is alert and oriented to person, place, and time.  Psychiatric:        Behavior: Behavior normal.        Thought Content: Thought content normal.        Judgment: Judgment normal.     Ortho Exam Right hip exam shows positive FADIR and positive logroll.  Negative straight leg.  Mildly positive Stinchfield sign.  Greater trochanter is nontender.  Lumbar spine is nontender. Specialty Comments:  No specialty comments available.  Imaging: No results found.   PMFS History: Patient Active Problem List   Diagnosis Date Noted  . History of colonic polyps   . Hepatitis, alcoholic 83/15/1761  . ETOH abuse 02/10/2015  . Internal hemorrhoids 07/17/2009  . Colon adenomas 07/15/2009   Past Medical History:  Diagnosis Date  . Arthritis   . Hx of adenomatous colonic polyps   . Hypertension   . Pneumothorax   . Stroke [facility]) 2005   left sided weakness  . Trigger finger     Family History  Problem Relation Age of Onset  . Esophageal cancer Father   . Colon cancer Neg Hx     Past Surgical History:  Procedure  Laterality Date  . COLONOSCOPY  May 2010   Dr. fields: 5 cm terminal ileum normal, 6 polyps removed, moderate internal hemorrhoids, simple adenomas, next colonoscopy May 2015  . COLONOSCOPY N/A 08/01/2015   Procedure: COLONOSCOPY;  Surgeon: Danie Binder, MD;  Location[facility];  Service: Endoscopy;  Laterality: N/A;  0830  . CRANIOTOMY  2000   pressure  . INGUINAL HERNIA REPAIR Right   . OLECRANON BURSECTOMY Right 02/11/2015   Procedure: EXCISION RIGHT OLECRANON BURSA;  Surgeon: Sanjuana Kava, MD;  Location: [facility];  Service: Orthopedics;  Laterality: Right;  . repair of trigger finger Left    pinky   Social History   Occupational History  . Occupation: retired  Tobacco Use  . Smoking status: Current Every Day Smoker    Packs/day: 0.25    Years: 50.00    Pack years: 12.50    Types:  Cigarettes  . Smokeless tobacco: Never Used  . Tobacco comment: 5 cigarettes per day  Substance and Sexual Activity  . Alcohol use: Yes    Alcohol/week: 5.0 standard drinks    Types: 5 Cans of beer per week    Comment: 3-4 beers/day  . Drug use: No  . Sexual activity: Yes    Birth control/protection: None

## 2018-10-05 NOTE — Addendum Note (Signed)
Addended by: Hortencia Pilar on: 10/05/2018 11:22 AM   Modules accepted: Orders

## 2018-10-05 NOTE — Telephone Encounter (Signed)
Hydrocodone-Acetaminophen  7.5/325 mg  Qty 65 Tablets  PATIENT USES [name]

## 2018-10-09 DIAGNOSIS — L851 Acquired keratosis [keratoderma] palmaris et plantaris: Secondary | ICD-10-CM | POA: Diagnosis not present

## 2018-10-09 DIAGNOSIS — B351 Tinea unguium: Secondary | ICD-10-CM | POA: Diagnosis not present

## 2018-10-09 DIAGNOSIS — M79674 Pain in right toe(s): Secondary | ICD-10-CM | POA: Diagnosis not present

## 2018-10-09 DIAGNOSIS — I739 Peripheral vascular disease, unspecified: Secondary | ICD-10-CM | POA: Diagnosis not present

## 2018-10-09 DIAGNOSIS — M79675 Pain in left toe(s): Secondary | ICD-10-CM | POA: Diagnosis not present

## 2018-10-10 MED ORDER — HYDROCODONE-ACETAMINOPHEN 7.5-325 MG PO TABS: 1.0000 | ORAL_TABLET | Freq: Four times a day (QID) | ORAL | 0 refills | Status: DC | PRN

## 2018-10-10 NOTE — Addendum Note (Signed)
Addended by: Willette Pa on: 10/10/2018 08:01 AM   Modules accepted: Orders

## 2018-11-08 ENCOUNTER — Telehealth: Payer: Self-pay | Admitting: Orthopaedic Surgery

## 2018-11-08 MED ORDER — HYDROCODONE-ACETAMINOPHEN 7.5-325 MG PO TABS: 1.0000 | ORAL_TABLET | Freq: Four times a day (QID) | ORAL | 0 refills | Status: DC | PRN

## 2018-11-08 NOTE — Telephone Encounter (Signed)
Hydrocodone-Acetaminophen  7.5/325 mg  Qty 65 Tablets  PATIENT USES [facility]

## 2018-11-17 DIAGNOSIS — Z0001 Encounter for general adult medical examination with abnormal findings: Secondary | ICD-10-CM | POA: Diagnosis not present

## 2018-11-17 DIAGNOSIS — K7689 Other specified diseases of liver: Secondary | ICD-10-CM | POA: Diagnosis not present

## 2018-11-17 DIAGNOSIS — I739 Peripheral vascular disease, unspecified: Secondary | ICD-10-CM | POA: Diagnosis not present

## 2018-11-17 DIAGNOSIS — Z0000 Encounter for general adult medical examination without abnormal findings: Secondary | ICD-10-CM | POA: Diagnosis not present

## 2018-11-17 DIAGNOSIS — I10 Essential (primary) hypertension: Secondary | ICD-10-CM | POA: Diagnosis not present

## 2018-11-17 DIAGNOSIS — Z1331 Encounter for screening for depression: Secondary | ICD-10-CM | POA: Diagnosis not present

## 2018-11-17 DIAGNOSIS — F17210 Nicotine dependence, cigarettes, uncomplicated: Secondary | ICD-10-CM | POA: Diagnosis not present

## 2018-11-17 DIAGNOSIS — Z1389 Encounter for screening for other disorder: Secondary | ICD-10-CM | POA: Diagnosis not present

## 2018-11-17 DIAGNOSIS — I712 Thoracic aortic aneurysm, without rupture: Secondary | ICD-10-CM | POA: Diagnosis not present

## 2018-11-17 DIAGNOSIS — R945 Abnormal results of liver function studies: Secondary | ICD-10-CM | POA: Diagnosis not present

## 2018-11-23 ENCOUNTER — Encounter: Payer: Self-pay | Admitting: Orthopaedic Surgery

## 2018-11-23 ENCOUNTER — Ambulatory Visit: Payer: PPO | Admitting: Orthopaedic Surgery

## 2018-11-23 VITALS — BP 131/98 | HR 86 | Ht 78.0 in | Wt 206.0 lb

## 2018-11-23 DIAGNOSIS — G8929 Other chronic pain: Secondary | ICD-10-CM | POA: Diagnosis not present

## 2018-11-23 DIAGNOSIS — F17210 Nicotine dependence, cigarettes, uncomplicated: Secondary | ICD-10-CM | POA: Diagnosis not present

## 2018-11-23 DIAGNOSIS — M5441 Lumbago with sciatica, right side: Secondary | ICD-10-CM

## 2018-11-23 NOTE — Patient Instructions (Signed)
Steps to Quit Smoking    Smoking tobacco can be bad for your health. It can also affect almost every organ in your body. Smoking puts you and people around you at risk for many serious long-lasting (chronic) diseases. Quitting smoking is hard, but it is one of the best things that you can do for your health. It is never too late to quit.  What are the benefits of quitting smoking?  When you quit smoking, you lower your risk for getting serious diseases and conditions. They can include:  · Lung cancer or lung disease.  · Heart disease.  · Stroke.  · Heart attack.  · Not being able to have children (infertility).  · Weak bones (osteoporosis) and broken bones (fractures).  If you have coughing, wheezing, and shortness of breath, those symptoms may get better when you quit. You may also get sick less often. If you are pregnant, quitting smoking can help to lower your chances of having a baby of low birth weight.  What can I do to help me quit smoking?  Talk with your doctor about what can help you quit smoking. Some things you can do (strategies) include:  · Quitting smoking totally, instead of slowly cutting back how much you smoke over a period of time.  · Going to in-person counseling. You are more likely to quit if you go to many counseling sessions.  · Using resources and support systems, such as:  ? Online chats with a counselor.  ? Phone quitlines.  ? Printed self-help materials.  ? Support groups or group counseling.  ? Text messaging programs.  ? Mobile phone apps or applications.  · Taking medicines. Some of these medicines may have nicotine in them. If you are pregnant or breastfeeding, do not take any medicines to quit smoking unless your doctor says it is okay. Talk with your doctor about counseling or other things that can help you.  Talk with your doctor about using more than one strategy at the same time, such as taking medicines while you are also going to in-person counseling. This can help make  quitting easier.  What things can I do to make it easier to quit?  Quitting smoking might feel very hard at first, but there is a lot that you can do to make it easier. Take these steps:  · Talk to your family and friends. Ask them to support and encourage you.  · Call phone quitlines, reach out to support groups, or work with a counselor.  · Ask people who smoke to not smoke around you.  · Avoid places that make you want (trigger) to smoke, such as:  ? Bars.  ? Parties.  ? Smoke-break areas at work.  · Spend time with people who do not smoke.  · Lower the stress in your life. Stress can make you want to smoke. Try these things to help your stress:  ? Getting regular exercise.  ? Deep-breathing exercises.  ? Yoga.  ? Meditating.  ? Doing a body scan. To do this, close your eyes, focus on one area of your body at a time from head to toe, and notice which parts of your body are tense. Try to relax the muscles in those areas.  · Download or buy apps on your mobile phone or tablet that can help you stick to your quit plan. There are many free apps, such as QuitGuide from the CDC (Centers for Disease Control and Prevention). You can find more   support from smokefree.gov and other websites.  This information is not intended to replace advice given to you by your health care provider. Make sure you discuss any questions you have with your health care provider.  Document Released: 07/03/2009 Document Revised: 05/04/2016 Document Reviewed: 01/21/2015  Elsevier Interactive Patient Education © 2019 Elsevier Inc.

## 2018-11-23 NOTE — Progress Notes (Signed)
[name], male DOB:December 07, 1945, 73 y.o. [id number]  Chief Complaint  Patient presents with  . Shoulder Pain    left     HPI  Larry Reese is a 73 y.o. male who has chronic left shoulder pain.  He is doing his exercises and taking his medicine. The cold weather has made it worse.  He has no new trauma, no paresthesias.  He has seen the neurosurgeon for his back and Dr.Xu for his hip.    Body mass index is 23.81 kg/m.  ROS  Review of Systems  Constitutional: Positive for activity change.  HENT: Negative for congestion.   Respiratory: Positive for cough. Negative for shortness of breath.   Cardiovascular: Negative for chest pain.  Endocrine: Positive for cold intolerance.  Musculoskeletal: Positive for arthralgias, back pain, myalgias and neck pain.  Allergic/Immunologic: Negative for environmental allergies.  All other systems reviewed and are negative.   All other systems reviewed and are negative.  The following is a summary of the past history medically, past history surgically, known current medicines, social history and family history.  This information is gathered electronically by the computer from prior information and documentation.  I review this each visit and have found including this information at this point in the chart is beneficial and informative.    Past Medical History:  Diagnosis Date  . Arthritis   . Hx of adenomatous colonic polyps   . Hypertension   . Pneumothorax   . Stroke [facility]) 2005   left sided weakness  . Trigger finger     Past Surgical History:  Procedure Laterality Date  . COLONOSCOPY  May 2010   Dr. fields: 5 cm terminal ileum normal, 6 polyps removed, moderate internal hemorrhoids, simple adenomas, next colonoscopy May 2015  . COLONOSCOPY N/A 08/01/2015   Procedure: COLONOSCOPY;  Surgeon: Danie Binder, MD;  [facility];  Service: Endoscopy;  Laterality: N/A;  0830  . CRANIOTOMY  2000   pressure  . INGUINAL HERNIA REPAIR Right   . OLECRANON BURSECTOMY Right 02/11/2015   Procedure: EXCISION RIGHT OLECRANON BURSA;  Surgeon: Sanjuana Kava, MD;  Location: [facility] ORS;  Service: Orthopedics;  Laterality: Right;  . repair of trigger finger Left    pinky    Family History  Problem Relation Age of Onset  . Esophageal cancer Father   . Colon cancer Neg Hx     Social History Social History   Tobacco Use  . Smoking status: Current Every Day Smoker    Packs/day: 0.25    Years: 50.00    Pack years: 12.50    Types: Cigarettes  . Smokeless tobacco: Never Used  . Tobacco comment: 5 cigarettes per day  Substance Use Topics  . Alcohol use: Yes    Alcohol/week: 5.0 standard drinks    Types: 5 Cans of beer per week    Comment: 3-4 beers/day  . Drug use: No    Allergies  Allergen Reactions  . Aleve [Naproxen] Other (See Comments)    Numbness in extremities   . Celecoxib Other (See Comments)    Celebrex. Nose bleed.  . Fluoxetine Hcl Swelling    Current Outpatient Medications  Medication Sig Dispense Refill  . ALPRAZolam (XANAX) 0.5 MG tablet Take 0.5 mg by mouth at bedtime as needed for sleep. Reported on 02/19/2016    . amLODipine (NORVASC) 10 MG tablet Take 10 mg by mouth daily.    Marland Kitchen aspirin EC 81 MG tablet Take 81 mg  by mouth daily.    . Chlorpheniramine Maleate (ALLERGY PO) Take 1 tablet by mouth daily as needed (allergies). Reported on 02/19/2016    . diclofenac sodium (VOLTAREN) 1 % GEL Apply 2 g topically 4 (four) times daily. 100 g 0  . HYDROcodone-acetaminophen (NORCO) 7.5-325 MG tablet Take 1 tablet by mouth every 6 (six) hours as needed for moderate pain. Must last 30 days 65 tablet 0  . tamsulosin (FLOMAX) 0.4 MG CAPS capsule      Current Facility-Administered Medications  Medication Dose Route Frequency Provider Last Rate Last Dose  . lidocaine (XYLOCAINE) 1 % (with pres) injection 8 mL  8 mL Infiltration Once Hilts, Michael, MD      . methylPREDNISolone  acetate (DEPO-MEDROL) injection 40 mg  40 mg Intra-articular Once Hilts, Michael, MD         Physical Exam  Blood pressure (!) 131/98, pulse 86, height 6\' 6"  (1.981 m), weight 206 lb (93.4 kg).  Constitutional: overall normal hygiene, normal nutrition, well developed, normal grooming, normal body habitus. Assistive device:none  Musculoskeletal: gait and station Limp none, muscle tone and strength are normal, no tremors or atrophy is present.  .  Neurological: coordination overall normal.  Deep tendon reflex/nerve stretch intact.  Sensation normal.  Cranial nerves II-XII intact.   Skin:   Normal overall no scars, lesions, ulcers or rashes. No psoriasis.  Psychiatric: Alert and oriented x 3.  Recent memory intact, remote memory unclear.  Normal mood and affect. Well groomed.  Good eye contact.  Cardiovascular: overall no swelling, no varicosities, no edema bilaterally, normal temperatures of the legs and arms, no clubbing, cyanosis and good capillary refill.  Lymphatic: palpation is normal.  Examination of left Upper Extremity is done.  Inspection:   Overall:  Elbow non-tender without crepitus or defects, forearm non-tender without crepitus or defects, wrist non-tender without crepitus or defects, hand non-tender.    Shoulder: with glenohumeral joint tenderness, without effusion.   Upper arm: without swelling and tenderness   Range of motion:   Overall:  Full range of motion of the elbow, full range of motion of wrist and full range of motion in fingers.   Shoulder:  left  160 degrees forward flexion; 140 degrees abduction; 35 degrees internal rotation, 35 degrees external rotation, 15 degrees extension, 40 degrees adduction.   Stability:   Overall:  Shoulder, elbow and wrist stable   Strength and Tone:   Overall full shoulder muscles strength, full upper arm strength and normal upper arm bulk and tone.  All other systems reviewed and are negative   The patient has been  educated about the nature of the problem(s) and counseled on treatment options.  The patient appeared to understand what I have discussed and is in agreement with it.  Encounter Diagnoses  Name Primary?  . Chronic right-sided low back pain with right-sided sciatica Yes  . Cigarette nicotine dependence without complication     PLAN Call if any problems.  Precautions discussed.  Continue current medications.   Return to clinic 3 months   Electronically Signed Sanjuana Kava, MD 3/5/202010:24 AM

## 2018-12-07 ENCOUNTER — Telehealth: Payer: Self-pay | Admitting: Orthopaedic Surgery

## 2018-12-07 MED ORDER — HYDROCODONE-ACETAMINOPHEN 7.5-325 MG PO TABS: 1.0000 | ORAL_TABLET | Freq: Four times a day (QID) | ORAL | 0 refills | Status: DC | PRN

## 2018-12-07 NOTE — Telephone Encounter (Signed)
Hydrocodone-Acetaminophen  7.5/325 mg  Qty 65 Tablets  PATIENT USES [facility]

## 2019-01-08 ENCOUNTER — Telehealth: Payer: Self-pay | Admitting: Orthopaedic Surgery

## 2019-01-08 MED ORDER — HYDROCODONE-ACETAMINOPHEN 7.5-325 MG PO TABS: 1.0000 | ORAL_TABLET | Freq: Four times a day (QID) | ORAL | 0 refills | Status: DC | PRN

## 2019-01-08 NOTE — Telephone Encounter (Signed)
Patient called for refill: HYDROcodone-acetaminophen (NORCO) 7.5-325 MG tablet 60 tablet 0   - [place]

## 2019-01-23 ENCOUNTER — Ambulatory Visit (INDEPENDENT_AMBULATORY_CARE_PROVIDER_SITE_OTHER): Payer: PPO

## 2019-01-23 ENCOUNTER — Ambulatory Visit (INDEPENDENT_AMBULATORY_CARE_PROVIDER_SITE_OTHER): Payer: PPO | Admitting: Orthopaedic Surgery

## 2019-01-23 ENCOUNTER — Encounter: Payer: Self-pay | Admitting: Orthopaedic Surgery

## 2019-01-23 ENCOUNTER — Other Ambulatory Visit: Payer: Self-pay

## 2019-01-23 DIAGNOSIS — R0781 Pleurodynia: Secondary | ICD-10-CM | POA: Insufficient documentation

## 2019-01-23 DIAGNOSIS — M25552 Pain in left hip: Secondary | ICD-10-CM

## 2019-01-23 DIAGNOSIS — M87051 Idiopathic aseptic necrosis of right femur: Secondary | ICD-10-CM

## 2019-01-23 MED ORDER — METHYLPREDNISOLONE 4 MG PO TBPK: ORAL_TABLET | ORAL | 0 refills | Status: DC

## 2019-01-23 NOTE — Progress Notes (Signed)
Office Visit Note   Patient: Larry Reese           Date of Birth: Jan 19, 1946           MRN: [id number] Visit Date: 01/23/2019              Requested by: [name], MD [address] [place], [place] [address] PCP: [name], MD   Assessment & Plan: Visit Diagnoses:  1. Avascular necrosis of bone of right hip (HCC)   2. Pain in left hip   3. Rib pain on left side     Plan: Impression is left buttocks pain.  Left rib contusion.  We will in regards to the left buttock pain, we will start him on a methylprednisolone taper and send him to formal physical therapy.  He notes that due to financial reasons he can only attend 1 visit.  We will have them provide him with a home exercise program.  He will follow-up with [place] in 4 weeks time for recheck.  Follow-Up Instructions: Return in about 4 weeks (around 02/20/2019).   Orders:  Orders Placed This Encounter  Procedures   XR HIP UNILAT W OR W/O PELVIS 2-3 VIEWS LEFT   XR Ribs Unilateral Left   Meds ordered this encounter  Medications   methylPREDNISolone (MEDROL DOSEPAK) 4 MG TBPK tablet    Sig: Take as directed    Dispense:  21 tablet    Refill:  0      Procedures: No procedures performed   Clinical Data: No additional findings.   Subjective: Chief Complaint  Patient presents with   Left Hip - Pain    L>R   Right Hip - Pain    HPI patient is a pleasant 73 year old gentleman who presents our clinic today with left hip and buttocks pain.  He notes multiple falls which have occurred over the past several months.  He was seen in our office in January of this past year after a fall which occurred in December for which began the onset of bilateral hip pain.  X-rays were obtained which showed AVN of the right hip.  He underwent intra-articular cortisone injection in the right hip by Dr. Junius Roads.  He has noticed significant relief following the injection.  His left pain has started to worsen.  The pain he  has is primarily to the buttocks.  He denies any anterior thigh or groin pain.  Pain is worse when sitting.  He has not taking any over-the-counter anti-inflammatories.  He denies any radicular symptoms.  He does note bilateral lower extremity weakness.  No bowel or bladder change and no saddle paresthesias.  He also is complaining of left-sided rib pain today following his most recent fall this past Saturday.  No chest pain or shortness of breath.  The pain is worse when he is coughing or taking a deep breath.  He does have a history of pneumothorax of the side.  No new fevers or chills.  Review of Systems as detailed in HPI.  All others reviewed and are negative.   Objective: Vital Signs: There were no vitals taken for this visit.  Physical Exam well-developed and well-nourished gentleman in no acute distress.  Alert and oriented x3.  Ortho Exam examination of his right hip she has decreased internal rotation, although this is without pain.  Negative logroll on the left.  Negative straight leg raise both sides.  He does have tenderness in the sciatic notch with what feels like  a bony or muscular prominence.  He is neurovascularly intact distally.  Specialty Comments:  No specialty comments available.  Imaging: Xr Hip Unilat W Or W/o Pelvis 2-3 Views Left  Result Date: 01/23/2019 X-rays demonstrate AVN right femoral head.  Left hip shows what appears to be a calcified iliopsoas.  No other acute findings  Xr Ribs Unilateral Left  Result Date: 01/23/2019 No acute structural abnormalities    PMFS History: Patient Active Problem List   Diagnosis Date Noted   Avascular necrosis of bone of right hip ([place]) 01/23/2019   Pain in left hip 01/23/2019   Rib pain on left side 01/23/2019   History of colonic polyps    Hepatitis, alcoholic 50/93/2671   ETOH abuse 02/10/2015   Internal hemorrhoids 07/17/2009   Colon adenomas 07/15/2009   Past Medical History:  Diagnosis Date    Arthritis    Hx of adenomatous colonic polyps    Hypertension    Pneumothorax    Stroke [facility]) 2005   left sided weakness   Trigger finger     Family History  Problem Relation Age of Onset   Esophageal cancer Father    Colon cancer Neg Hx     Past Surgical History:  Procedure Laterality Date   COLONOSCOPY  May 2010   Dr. fields: 5 cm terminal ileum normal, 6 polyps removed, moderate internal hemorrhoids, simple adenomas, next colonoscopy May 2015   COLONOSCOPY N/A 08/01/2015   Procedure: COLONOSCOPY;  Surgeon: Danie Binder, MD;  Location[facility];  Service: Endoscopy;  Laterality: N/A;  0830   CRANIOTOMY  2000   pressure   INGUINAL HERNIA REPAIR Right    OLECRANON BURSECTOMY Right 02/11/2015   Procedure: EXCISION RIGHT OLECRANON BURSA;  Surgeon: Sanjuana Kava, MD;  Location: [facility];  Service: Orthopedics;  Laterality: Right;   repair of trigger finger Left    pinky   Social History   Occupational History   Occupation: retired  Tobacco Use   Smoking status: Current Every Day Smoker    Packs/day: 0.25    Years: 50.00    Pack years: 12.50    Types: Cigarettes   Smokeless tobacco: Never Used   Tobacco comment: 5 cigarettes per day  Substance and Sexual Activity   Alcohol use: Yes    Alcohol/week: 5.0 standard drinks    Types: 5 Cans of beer per week    Comment: 3-4 beers/day   Drug use: No   Sexual activity: Yes    Birth control/protection: None

## 2019-02-08 ENCOUNTER — Telehealth: Payer: Self-pay | Admitting: Orthopaedic Surgery

## 2019-02-08 MED ORDER — HYDROCODONE-ACETAMINOPHEN 7.5-325 MG PO TABS: 1.0000 | ORAL_TABLET | Freq: Four times a day (QID) | ORAL | 0 refills | Status: DC | PRN

## 2019-02-08 NOTE — Telephone Encounter (Signed)
Patient called for refill: HYDROcodone-acetaminophen (NORCO) 7.5-325 MG tablet 60 tablet  - [place]

## 2019-02-20 ENCOUNTER — Other Ambulatory Visit: Payer: Self-pay

## 2019-02-20 ENCOUNTER — Ambulatory Visit (INDEPENDENT_AMBULATORY_CARE_PROVIDER_SITE_OTHER): Payer: PPO

## 2019-02-20 ENCOUNTER — Encounter: Payer: Self-pay | Admitting: Orthopaedic Surgery

## 2019-02-20 ENCOUNTER — Ambulatory Visit (INDEPENDENT_AMBULATORY_CARE_PROVIDER_SITE_OTHER): Payer: PPO | Admitting: Orthopaedic Surgery

## 2019-02-20 DIAGNOSIS — M79674 Pain in right toe(s): Secondary | ICD-10-CM | POA: Diagnosis not present

## 2019-02-20 DIAGNOSIS — S7002XA Contusion of left hip, initial encounter: Secondary | ICD-10-CM | POA: Insufficient documentation

## 2019-02-20 DIAGNOSIS — G8929 Other chronic pain: Secondary | ICD-10-CM | POA: Insufficient documentation

## 2019-02-20 DIAGNOSIS — S7002XD Contusion of left hip, subsequent encounter: Secondary | ICD-10-CM

## 2019-02-20 NOTE — Progress Notes (Signed)
Office Visit Note   Patient: Larry Reese           Date of Birth: [birth year]           MRN: [id number] Visit Date: 02/20/2019              Requested by: [name], MD [address] [place], [place] [address] PCP: [name], MD   Assessment & Plan: Visit Diagnoses:  1. Chronic toe pain, right foot   2. Hematoma of left hip, subsequent encounter     Plan: Impression is left hip resolving hematoma and right third claw toe.  In regards to the hematoma, he will apply heat.  Hopefully this will continue to resorb.  In regards to the culture, we will provide him with a hammertoe crest.  This will hopefully help offset some of the pressure when ambulating.  Have also suggested wide toe box shoes.  He will follow-up with [place] as needed.  Follow-Up Instructions: Return if symptoms worsen or fail to improve.   Orders:  Orders Placed This Encounter  Procedures  . XR Toe 3rd Right   No orders of the defined types were placed in this encounter.     Procedures: No procedures performed   Clinical Data: No additional findings.   Subjective: Chief Complaint  Patient presents with  . Right Hip - Pain    HPI patient is a pleasant 73 year old gentleman who presents our clinic today for follow-up of his left buttock pain as well as new complaints of right third toe pain.  In regards to the left buttocks, he has had pain and questionable hematoma after sustaining several falls over the past 6 months.  Previous x-rays were negative for fracture.  He was started on oral steroid taper and encouraged to use ice and heat to the hematoma.  He notices significant relief of symptoms over the past month.  He does note that the "knot "has moved proximal and posterior.  This only causes slight uncomfortable when he is sitting or lying on the left side.  No fevers or chills.  Other issue he brings up with his right third toe.  He has had pain since last year.  He is unsure exactly  what started the pain, but he notes that he was told by his wife that he had a nightmare and was found kicking on the floor.  He thinks he may have kicked something hard, but is unsure what really happened.  He has been seen by podiatrist for this were x-rays obtained were negative according to the patient.  He notes that his pain initially improved, but has started to worsen.  He has pain with bearing weight as well as when he is wearing closed toe shoes.  Review of Systems as detailed in HPI.  All others reviewed and are negative.   Objective: Vital Signs: There were no vitals taken for this visit.  Physical Exam well-developed well-nourished gentleman in no acute distress.  Alert and oriented x3.  Ortho Exam examination of his left buttocks/lateral hip reveals a soft and mobile area without skin changes.  Mild tenderness.  Right toe reveal flexion of the pip/dip joints with callus formation to the tip of the toe.    Specialty Comments:  No specialty comments available.  Imaging: Xr Toe 3rd Right  Result Date: 02/20/2019 IP joint arthritis without evidence of new or old fracture    PMFS History: Patient Active Problem List   Diagnosis Date Noted  .  Chronic toe pain, right foot 02/20/2019  . Hematoma of left hip 02/20/2019  . Avascular necrosis of bone of right hip ([place]) 01/23/2019  . Pain in left hip 01/23/2019  . Rib pain on left side 01/23/2019  . History of colonic polyps   . Hepatitis, alcoholic 65/53/7482  . ETOH abuse 02/10/2015  . Internal hemorrhoids 07/17/2009  . Colon adenomas 07/15/2009   Past Medical History:  Diagnosis Date  . Arthritis   . Hx of adenomatous colonic polyps   . Hypertension   . Pneumothorax   . Stroke [facility]) 2005   left sided weakness  . Trigger finger     Family History  Problem Relation Age of Onset  . Esophageal cancer Father   . Colon cancer Neg Hx     Past Surgical History:  Procedure Laterality Date  . COLONOSCOPY  May 2010    Dr. fields: 5 cm terminal ileum normal, 6 polyps removed, moderate internal hemorrhoids, simple adenomas, next colonoscopy May 2015  . COLONOSCOPY N/A 08/01/2015   Procedure: COLONOSCOPY;  Surgeon: Danie Binder, MD;  [facility];  Service: Endoscopy;  Laterality: N/A;  0830  . CRANIOTOMY  2000   pressure  . INGUINAL HERNIA REPAIR Right   . OLECRANON BURSECTOMY Right 02/11/2015   Procedure: EXCISION RIGHT OLECRANON BURSA;  Surgeon: Sanjuana Kava, MD;  Location: [facility];  Service: Orthopedics;  Laterality: Right;  . repair of trigger finger Left    pinky   Social History   Occupational History  . Occupation: retired  Tobacco Use  . Smoking status: Current Every Day Smoker    Packs/day: 0.25    Years: 50.00    Pack years: 12.50    Types: Cigarettes  . Smokeless tobacco: Never Used  . Tobacco comment: 5 cigarettes per day  Substance and Sexual Activity  . Alcohol use: Yes    Alcohol/week: 5.0 standard drinks    Types: 5 Cans of beer per week    Comment: 3-4 beers/day  . Drug use: No  . Sexual activity: Yes    Birth control/protection: None

## 2019-02-22 ENCOUNTER — Ambulatory Visit: Payer: PPO | Admitting: Orthopaedic Surgery

## 2019-03-01 ENCOUNTER — Other Ambulatory Visit: Payer: Self-pay

## 2019-03-01 ENCOUNTER — Ambulatory Visit: Payer: PPO | Admitting: Orthopaedic Surgery

## 2019-03-01 ENCOUNTER — Encounter: Payer: Self-pay | Admitting: Orthopaedic Surgery

## 2019-03-01 VITALS — BP 148/87 | HR 79 | Temp 98.1°F | Ht 78.5 in | Wt 206.0 lb

## 2019-03-01 DIAGNOSIS — M5441 Lumbago with sciatica, right side: Secondary | ICD-10-CM | POA: Diagnosis not present

## 2019-03-01 DIAGNOSIS — G8929 Other chronic pain: Secondary | ICD-10-CM

## 2019-03-01 DIAGNOSIS — F17210 Nicotine dependence, cigarettes, uncomplicated: Secondary | ICD-10-CM | POA: Diagnosis not present

## 2019-03-01 DIAGNOSIS — M25512 Pain in left shoulder: Secondary | ICD-10-CM

## 2019-03-01 NOTE — Progress Notes (Signed)
[name], male DOB:23-Mar-1946, 73 y.o. [id number]  Chief Complaint  Patient presents with  . Shoulder Pain    Chronic left shoulder pain.    HPI  Larry Reese is a 73 y.o. male who has chronic shoulder pain more on the right.  He has no new trauma.  NV intact.     Body mass index is 23.5 kg/m.  ROS  Review of Systems  Constitutional: Positive for activity change.  HENT: Negative for congestion.   Respiratory: Positive for cough. Negative for shortness of breath.   Cardiovascular: Negative for chest pain.  Endocrine: Positive for cold intolerance.  Musculoskeletal: Positive for arthralgias, back pain, myalgias and neck pain.  Allergic/Immunologic: Negative for environmental allergies.  All other systems reviewed and are negative.   All other systems reviewed and are negative.  The following is a summary of the past history medically, past history surgically, known current medicines, social history and family history.  This information is gathered electronically by the computer from prior information and documentation.  I review this each visit and have found including this information at this point in the chart is beneficial and informative.    Past Medical History:  Diagnosis Date  . Arthritis   . Hx of adenomatous colonic polyps   . Hypertension   . Pneumothorax   . Stroke [facility]) 2005   left sided weakness  . Trigger finger     Past Surgical History:  Procedure Laterality Date  . COLONOSCOPY  May 2010   Dr. fields: 5 cm terminal ileum normal, 6 polyps removed, moderate internal hemorrhoids, simple adenomas, next colonoscopy May 2015  . COLONOSCOPY N/A 08/01/2015   Procedure: COLONOSCOPY;  Surgeon: Danie Binder, MD;  Location: [facility];  Service: Endoscopy;  Laterality: N/A;  0830  . CRANIOTOMY  2000   pressure  . INGUINAL HERNIA REPAIR Right   . OLECRANON BURSECTOMY Right 02/11/2015   Procedure: EXCISION RIGHT OLECRANON BURSA;   Surgeon: Sanjuana Kava, MD;  Location: [facility];  Service: Orthopedics;  Laterality: Right;  . repair of trigger finger Left    pinky    Family History  Problem Relation Age of Onset  . Esophageal cancer Father   . Colon cancer Neg Hx     Social History Social History   Tobacco Use  . Smoking status: Current Every Day Smoker    Packs/day: 0.25    Years: 50.00    Pack years: 12.50    Types: Cigarettes  . Smokeless tobacco: Never Used  . Tobacco comment: 5 cigarettes per day  Substance Use Topics  . Alcohol use: Yes    Alcohol/week: 5.0 standard drinks    Types: 5 Cans of beer per week    Comment: 3-4 beers/day  . Drug use: No    Allergies  Allergen Reactions  . Aleve [Naproxen] Other (See Comments)    Numbness in extremities   . Celecoxib Other (See Comments)    Celebrex. Nose bleed.  . Fluoxetine Hcl Swelling    Current Outpatient Medications  Medication Sig Dispense Refill  . ALPRAZolam (XANAX) 0.5 MG tablet Take 0.5 mg by mouth at bedtime as needed for sleep. Reported on 02/19/2016    . amLODipine (NORVASC) 10 MG tablet Take 10 mg by mouth daily.    Marland Kitchen aspirin EC 81 MG tablet Take 81 mg by mouth daily.    . Chlorpheniramine Maleate (ALLERGY PO) Take 1 tablet by mouth daily as needed (allergies). Reported on 02/19/2016    .  diclofenac sodium (VOLTAREN) 1 % GEL Apply 2 g topically 4 (four) times daily. 100 g 0  . HYDROcodone-acetaminophen (NORCO) 7.5-325 MG tablet Take 1 tablet by mouth every 6 (six) hours as needed for moderate pain. Must last 30 days 60 tablet 0  . methylPREDNISolone (MEDROL DOSEPAK) 4 MG TBPK tablet Take as directed 21 tablet 0  . tamsulosin (FLOMAX) 0.4 MG CAPS capsule      Current Facility-Administered Medications  Medication Dose Route Frequency Provider Last Rate Last Dose  . lidocaine (XYLOCAINE) 1 % (with pres) injection 8 mL  8 mL Infiltration Once Hilts, Michael, MD      . methylPREDNISolone acetate (DEPO-MEDROL) injection 40 mg  40 mg  Intra-articular Once Hilts, Michael, MD         Physical Exam  Blood pressure (!) 148/87, pulse 79, temperature 98.1 F (36.7 C), height 6' 6.5" (1.994 m), weight 206 lb (93.4 kg).  Constitutional: overall normal hygiene, normal nutrition, well developed, normal grooming, normal body habitus. Assistive device:none  Musculoskeletal: gait and station Limp none, muscle tone and strength are normal, no tremors or atrophy is present.  .  Neurological: coordination overall normal.  Deep tendon reflex/nerve stretch intact.  Sensation normal.  Cranial nerves II-XII intact.   Skin:   Normal overall no scars, lesions, ulcers or rashes. No psoriasis.  Psychiatric: Alert and oriented x 3.  Recent memory intact, remote memory unclear.  Normal mood and affect. Well groomed.  Good eye contact.  Cardiovascular: overall no swelling, no varicosities, no edema bilaterally, normal temperatures of the legs and arms, no clubbing, cyanosis and good capillary refill.  Lymphatic: palpation is normal.  His right shoulder has tenderness. ROM forward 145, abduction 120, internal 30, external 30, extension 10, adduction full.  Left is very similar.  NV intact.  All other systems reviewed and are negative   The patient has been educated about the nature of the problem(s) and counseled on treatment options.  The patient appeared to understand what I have discussed and is in agreement with it.  Encounter Diagnoses  Name Primary?  . Chronic right-sided low back pain with right-sided sciatica Yes  . Cigarette nicotine dependence without complication   . Chronic left shoulder pain     PLAN Call if any problems.  Precautions discussed.  Continue current medications.   Return to clinic 3 months   Electronically Signed Sanjuana Kava, MD 6/11/202010:44 AM

## 2019-03-08 ENCOUNTER — Other Ambulatory Visit: Payer: Self-pay | Admitting: Radiology

## 2019-03-08 MED ORDER — HYDROCODONE-ACETAMINOPHEN 7.5-325 MG PO TABS: 1.0000 | ORAL_TABLET | Freq: Four times a day (QID) | ORAL | 0 refills | Status: DC | PRN

## 2019-03-08 NOTE — Telephone Encounter (Signed)
Refill request  Larry Reese

## 2019-04-05 DIAGNOSIS — H2513 Age-related nuclear cataract, bilateral: Secondary | ICD-10-CM | POA: Diagnosis not present

## 2019-04-05 DIAGNOSIS — M1990 Unspecified osteoarthritis, unspecified site: Secondary | ICD-10-CM | POA: Diagnosis not present

## 2019-04-05 DIAGNOSIS — F17200 Nicotine dependence, unspecified, uncomplicated: Secondary | ICD-10-CM | POA: Diagnosis not present

## 2019-04-05 DIAGNOSIS — I10 Essential (primary) hypertension: Secondary | ICD-10-CM | POA: Diagnosis not present

## 2019-04-05 DIAGNOSIS — I739 Peripheral vascular disease, unspecified: Secondary | ICD-10-CM | POA: Diagnosis not present

## 2019-04-05 DIAGNOSIS — F17210 Nicotine dependence, cigarettes, uncomplicated: Secondary | ICD-10-CM | POA: Diagnosis not present

## 2019-04-09 ENCOUNTER — Telehealth: Payer: Self-pay | Admitting: Orthopaedic Surgery

## 2019-04-09 NOTE — Telephone Encounter (Signed)
Patient requests refill on Hydrocodone/Acetaminophen 7.5-325  Mgs.  Qty  60  Sig: Take 1 tablet by mouth every 6 (six) hours as needed for moderate pain. Must last 30 days  Patient states he uses [facility]

## 2019-04-10 MED ORDER — HYDROCODONE-ACETAMINOPHEN 7.5-325 MG PO TABS: 1.0000 | ORAL_TABLET | Freq: Four times a day (QID) | ORAL | 0 refills | Status: DC | PRN

## 2019-05-10 ENCOUNTER — Telehealth: Payer: Self-pay | Admitting: Orthopaedic Surgery

## 2019-05-10 MED ORDER — HYDROCODONE-ACETAMINOPHEN 7.5-325 MG PO TABS: 1.0000 | ORAL_TABLET | Freq: Four times a day (QID) | ORAL | 0 refills | Status: DC | PRN

## 2019-05-10 NOTE — Addendum Note (Signed)
Addended by: Willette Pa on: 05/10/2019 12:41 PM   Modules accepted: Orders

## 2019-05-10 NOTE — Telephone Encounter (Signed)
Patient called for refill: HYDROcodone-acetaminophen (NORCO) 7.5-325 MG tablet 60 tablet  -[facility]  

## 2019-05-20 DIAGNOSIS — M549 Dorsalgia, unspecified: Secondary | ICD-10-CM | POA: Diagnosis not present

## 2019-05-20 DIAGNOSIS — I739 Peripheral vascular disease, unspecified: Secondary | ICD-10-CM | POA: Diagnosis not present

## 2019-05-31 ENCOUNTER — Other Ambulatory Visit: Payer: Self-pay

## 2019-05-31 ENCOUNTER — Ambulatory Visit (INDEPENDENT_AMBULATORY_CARE_PROVIDER_SITE_OTHER): Payer: PPO | Admitting: Orthopaedic Surgery

## 2019-05-31 ENCOUNTER — Encounter: Payer: Self-pay | Admitting: Orthopaedic Surgery

## 2019-05-31 VITALS — BP 130/89 | HR 83 | Temp 98.1°F | Ht 78.5 in | Wt 205.0 lb

## 2019-05-31 DIAGNOSIS — G8929 Other chronic pain: Secondary | ICD-10-CM | POA: Diagnosis not present

## 2019-05-31 DIAGNOSIS — F17210 Nicotine dependence, cigarettes, uncomplicated: Secondary | ICD-10-CM | POA: Diagnosis not present

## 2019-05-31 DIAGNOSIS — M25512 Pain in left shoulder: Secondary | ICD-10-CM

## 2019-05-31 NOTE — Progress Notes (Signed)
[name], male DOB:1946/08/15, 73 y.o. [id number]  Chief Complaint  Patient presents with  . Shoulder Pain    Chronic pain of left shoulder.    HPI  Larry Reese is a 73 y.o. male who has chronic left shoulder pain.  He has pain at night and with overhead lifting.  He is doing his exercises and taking his medicine.  He has no new trauma,no weakness.   Body mass index is 23.39 kg/m.  ROS  Review of Systems  Constitutional: Positive for activity change.  HENT: Negative for congestion.   Respiratory: Positive for cough. Negative for shortness of breath.   Cardiovascular: Negative for chest pain.  Endocrine: Positive for cold intolerance.  Musculoskeletal: Positive for arthralgias, back pain, myalgias and neck pain.  Allergic/Immunologic: Negative for environmental allergies.  All other systems reviewed and are negative.   All other systems reviewed and are negative.  The following is a summary of the past history medically, past history surgically, known current medicines, social history and family history.  This information is gathered electronically by the computer from prior information and documentation.  I review this each visit and have found including this information at this point in the chart is beneficial and informative.    Past Medical History:  Diagnosis Date  . Arthritis   . Hx of adenomatous colonic polyps   . Hypertension   . Pneumothorax   . Stroke [facility]) 2005   left sided weakness  . Trigger finger     Past Surgical History:  Procedure Laterality Date  . COLONOSCOPY  May 2010   Dr. fields: 5 cm terminal ileum normal, 6 polyps removed, moderate internal hemorrhoids, simple adenomas, next colonoscopy May 2015  . COLONOSCOPY N/A 08/01/2015   Procedure: COLONOSCOPY;  Surgeon: Danie Binder, MD;  [facility];  Service: Endoscopy;  Laterality: N/A;  0830  . CRANIOTOMY  2000   pressure  . INGUINAL HERNIA REPAIR Right    . OLECRANON BURSECTOMY Right 02/11/2015   Procedure: EXCISION RIGHT OLECRANON BURSA;  Surgeon: Sanjuana Kava, MD;  Location: [facility];  Service: Orthopedics;  Laterality: Right;  . repair of trigger finger Left    pinky    Family History  Problem Relation Age of Onset  . Esophageal cancer Father   . Colon cancer Neg Hx     Social History Social History   Tobacco Use  . Smoking status: Current Every Day Smoker    Packs/day: 0.25    Years: 50.00    Pack years: 12.50    Types: Cigarettes  . Smokeless tobacco: Never Used  . Tobacco comment: 5 cigarettes per day  Substance Use Topics  . Alcohol use: Yes    Alcohol/week: 5.0 standard drinks    Types: 5 Cans of beer per week    Comment: 3-4 beers/day  . Drug use: No    Allergies  Allergen Reactions  . Aleve [Naproxen] Other (See Comments)    Numbness in extremities   . Celecoxib Other (See Comments)    Celebrex. Nose bleed.  . Fluoxetine Hcl Swelling    Current Outpatient Medications  Medication Sig Dispense Refill  . ALPRAZolam (XANAX) 0.5 MG tablet Take 0.5 mg by mouth at bedtime as needed for sleep. Reported on 02/19/2016    . amLODipine (NORVASC) 10 MG tablet Take 10 mg by mouth daily.    Marland Kitchen aspirin EC 81 MG tablet Take 81 mg by mouth daily.    . Chlorpheniramine Maleate (  ALLERGY PO) Take 1 tablet by mouth daily as needed (allergies). Reported on 02/19/2016    . diclofenac sodium (VOLTAREN) 1 % GEL Apply 2 g topically 4 (four) times daily. 100 g 0  . HYDROcodone-acetaminophen (NORCO) 7.5-325 MG tablet Take 1 tablet by mouth every 6 (six) hours as needed for moderate pain. Must last 30 days 60 tablet 0  . methylPREDNISolone (MEDROL DOSEPAK) 4 MG TBPK tablet Take as directed 21 tablet 0  . tamsulosin (FLOMAX) 0.4 MG CAPS capsule      Current Facility-Administered Medications  Medication Dose Route Frequency Provider Last Rate Last Dose  . lidocaine (XYLOCAINE) 1 % (with pres) injection 8 mL  8 mL Infiltration Once Hilts,  Michael, MD      . methylPREDNISolone acetate (DEPO-MEDROL) injection 40 mg  40 mg Intra-articular Once Hilts, Michael, MD         Physical Exam  Blood pressure 130/89, pulse 83, temperature 98.1 F (36.7 C), height 6' 6.5" (1.994 m), weight 205 lb (93 kg).  Constitutional: overall normal hygiene, normal nutrition, well developed, normal grooming, normal body habitus. Assistive device:none  Musculoskeletal: gait and station Limp none, muscle tone and strength are normal, no tremors or atrophy is present.  .  Neurological: coordination overall normal.  Deep tendon reflex/nerve stretch intact.  Sensation normal.  Cranial nerves II-XII intact.   Skin:   Normal overall no scars, lesions, ulcers or rashes. No psoriasis.  Psychiatric: Alert and oriented x 3.  Recent memory intact, remote memory unclear.  Normal mood and affect. Well groomed.  Good eye contact.  Cardiovascular: overall no swelling, no varicosities, no edema bilaterally, normal temperatures of the legs and arms, no clubbing, cyanosis and good capillary refill.  Lymphatic: palpation is normal.  Examination of left Upper Extremity is done.  Inspection:   Overall:  Elbow non-tender without crepitus or defects, forearm non-tender without crepitus or defects, wrist non-tender without crepitus or defects, hand non-tender.    Shoulder: with glenohumeral joint tenderness, without effusion.   Upper arm:  without swelling and tenderness   Range of motion:   Overall:  Full range of motion of the elbow, full range of motion of wrist and full range of motion in fingers.   Shoulder:  left  150 degrees forward flexion; 145 degrees abduction; 30 degrees internal rotation, 30 degrees external rotation, 15 degrees extension, 40 degrees adduction.   Stability:   Overall:  Shoulder, elbow and wrist stable   Strength and Tone:   Overall full shoulder muscles strength, full upper arm strength and normal upper arm bulk and tone. All other  systems reviewed and are negative   The patient has been educated about the nature of the problem(s) and counseled on treatment options.  The patient appeared to understand what I have discussed and is in agreement with it.  Encounter Diagnoses  Name Primary?  . Chronic left shoulder pain Yes  . Cigarette nicotine dependence without complication     PLAN Call if any problems.  Precautions discussed.  Continue current medications.   Return to clinic 3 months   Electronically Signed Sanjuana Kava, MD 9/10/20208:49 AM

## 2019-06-07 ENCOUNTER — Telehealth: Payer: Self-pay | Admitting: Orthopaedic Surgery

## 2019-06-07 MED ORDER — HYDROCODONE-ACETAMINOPHEN 7.5-325 MG PO TABS: 1.0000 | ORAL_TABLET | Freq: Four times a day (QID) | ORAL | 0 refills | Status: DC | PRN

## 2019-06-07 NOTE — Telephone Encounter (Signed)
Patient requests refill on Hydrocodone/Acetaminophen 7.5-325 mgs.  Qty  60  Sig: Take 1 tablet by mouth every 6 (six) hours as needed for moderate pain. Must last 30 days  Patient states he uses [facility]

## 2019-06-20 DIAGNOSIS — M549 Dorsalgia, unspecified: Secondary | ICD-10-CM | POA: Diagnosis not present

## 2019-06-20 DIAGNOSIS — I10 Essential (primary) hypertension: Secondary | ICD-10-CM | POA: Diagnosis not present

## 2019-07-09 ENCOUNTER — Telehealth: Payer: Self-pay

## 2019-07-09 MED ORDER — HYDROCODONE-ACETAMINOPHEN 7.5-325 MG PO TABS: 1.0000 | ORAL_TABLET | Freq: Four times a day (QID) | ORAL | 0 refills | Status: DC | PRN

## 2019-07-09 NOTE — Telephone Encounter (Signed)
Hydrocodone-Acetaminophen 7.5/325mg  Qty 60 Tablets  PATIENT USES  PHARMACY 

## 2019-07-20 DIAGNOSIS — I10 Essential (primary) hypertension: Secondary | ICD-10-CM | POA: Diagnosis not present

## 2019-07-20 DIAGNOSIS — M549 Dorsalgia, unspecified: Secondary | ICD-10-CM | POA: Diagnosis not present

## 2019-08-02 ENCOUNTER — Encounter: Payer: Self-pay | Admitting: Orthopaedic Surgery

## 2019-08-09 ENCOUNTER — Telehealth: Payer: Self-pay

## 2019-08-09 NOTE — Telephone Encounter (Signed)
Hydrocodone-Acetaminophen 7.5/325mg  Qty 60 Tablets  PATIENT USES  PHARMACY 

## 2019-08-13 MED ORDER — HYDROCODONE-ACETAMINOPHEN 7.5-325 MG PO TABS: 1.0000 | ORAL_TABLET | Freq: Four times a day (QID) | ORAL | 0 refills | Status: DC | PRN

## 2019-08-20 DIAGNOSIS — M549 Dorsalgia, unspecified: Secondary | ICD-10-CM | POA: Diagnosis not present

## 2019-08-20 DIAGNOSIS — I10 Essential (primary) hypertension: Secondary | ICD-10-CM | POA: Diagnosis not present

## 2019-08-24 DIAGNOSIS — I712 Thoracic aortic aneurysm, without rupture: Secondary | ICD-10-CM | POA: Diagnosis not present

## 2019-08-24 DIAGNOSIS — Z23 Encounter for immunization: Secondary | ICD-10-CM | POA: Diagnosis not present

## 2019-08-24 DIAGNOSIS — I10 Essential (primary) hypertension: Secondary | ICD-10-CM | POA: Diagnosis not present

## 2019-08-24 DIAGNOSIS — I739 Peripheral vascular disease, unspecified: Secondary | ICD-10-CM | POA: Diagnosis not present

## 2019-08-24 DIAGNOSIS — N400 Enlarged prostate without lower urinary tract symptoms: Secondary | ICD-10-CM | POA: Diagnosis not present

## 2019-08-30 ENCOUNTER — Ambulatory Visit: Payer: PPO | Admitting: Orthopaedic Surgery

## 2019-09-11 ENCOUNTER — Ambulatory Visit: Payer: PPO | Admitting: Orthopaedic Surgery

## 2019-09-11 ENCOUNTER — Encounter: Payer: Self-pay | Admitting: Orthopaedic Surgery

## 2019-09-11 ENCOUNTER — Other Ambulatory Visit: Payer: Self-pay

## 2019-09-11 VITALS — BP 138/80 | HR 80 | Temp 97.2°F | Ht 78.0 in | Wt 210.0 lb

## 2019-09-11 DIAGNOSIS — M79641 Pain in right hand: Secondary | ICD-10-CM | POA: Diagnosis not present

## 2019-09-11 DIAGNOSIS — M25512 Pain in left shoulder: Secondary | ICD-10-CM | POA: Diagnosis not present

## 2019-09-11 DIAGNOSIS — M79642 Pain in left hand: Secondary | ICD-10-CM | POA: Diagnosis not present

## 2019-09-11 DIAGNOSIS — G8929 Other chronic pain: Secondary | ICD-10-CM

## 2019-09-11 DIAGNOSIS — F17210 Nicotine dependence, cigarettes, uncomplicated: Secondary | ICD-10-CM

## 2019-09-11 MED ORDER — HYDROCODONE-ACETAMINOPHEN 7.5-325 MG PO TABS: 1.0000 | ORAL_TABLET | Freq: Four times a day (QID) | ORAL | 0 refills | Status: DC | PRN

## 2019-09-11 NOTE — Addendum Note (Signed)
Addended byCandice Camp on: 09/11/2019 09:49 AM   Modules accepted: Orders

## 2019-09-11 NOTE — Progress Notes (Signed)
Patient [id number] Larry Reese, male DOB:1945/11/15, 73 y.o. [id number]  Chief Complaint  Patient presents with  . Shoulder Pain    Chronic left shoulder pain.    HPI  Larry Reese is a 73 y.o. male who has left shoulder pain and Duputryens of the right little finger.  His shoulder is tender more with the cold weather.  He has no new trauma.  I will have hand surgeon see him for the finger.  He had surgery on the left hand several years ago.   Body mass index is 24.27 kg/m.  ROS  Review of Systems  Constitutional: Positive for activity change.  HENT: Negative for congestion.   Respiratory: Positive for cough. Negative for shortness of breath.   Cardiovascular: Negative for chest pain.  Endocrine: Positive for cold intolerance.  Musculoskeletal: Positive for arthralgias, back pain, myalgias and neck pain.  Allergic/Immunologic: Negative for environmental allergies.  All other systems reviewed and are negative.   All other systems reviewed and are negative.  The following is a summary of the past history medically, past history surgically, known current medicines, social history and family history.  This information is gathered electronically by the computer from prior information and documentation.  I review this each visit and have found including this information at this point in the chart is beneficial and informative.    Past Medical History:  Diagnosis Date  . Arthritis   . Hx of adenomatous colonic polyps   . Hypertension   . Pneumothorax   . Stroke [facility]) 2005   left sided weakness  . Trigger finger     Past Surgical History:  Procedure Laterality Date  . COLONOSCOPY  May 2010   Dr. fields: 5 cm terminal ileum normal, 6 polyps removed, moderate internal hemorrhoids, simple adenomas, next colonoscopy May 2015  . COLONOSCOPY N/A 08/01/2015   Procedure: COLONOSCOPY;  Surgeon: Danie Binder, MD;  [facility];  Service: Endoscopy;  Laterality: N/A;   0830  . CRANIOTOMY  2000   pressure  . INGUINAL HERNIA REPAIR Right   . OLECRANON BURSECTOMY Right 02/11/2015   Procedure: EXCISION RIGHT OLECRANON BURSA;  Surgeon: Sanjuana Kava, MD;  Location: [facility];  Service: Orthopedics;  Laterality: Right;  . repair of trigger finger Left    pinky    Family History  Problem Relation Age of Onset  . Esophageal cancer Father   . Colon cancer Neg Hx     Social History Social History   Tobacco Use  . Smoking status: Current Every Day Smoker    Packs/day: 0.25    Years: 50.00    Pack years: 12.50    Types: Cigarettes  . Smokeless tobacco: Never Used  . Tobacco comment: 5 cigarettes per day  Substance Use Topics  . Alcohol use: Yes    Alcohol/week: 5.0 standard drinks    Types: 5 Cans of beer per week    Comment: 3-4 beers/day  . Drug use: No    Allergies  Allergen Reactions  . Aleve [Naproxen] Other (See Comments)    Numbness in extremities   . Celecoxib Other (See Comments)    Celebrex. Nose bleed.  . Fluoxetine Hcl Swelling    Current Outpatient Medications  Medication Sig Dispense Refill  . ALPRAZolam (XANAX) 0.5 MG tablet Take 0.5 mg by mouth at bedtime as needed for sleep. Reported on 02/19/2016    . amLODipine (NORVASC) 10 MG tablet Take 10 mg by mouth daily.    Marland Kitchen aspirin  EC 81 MG tablet Take 81 mg by mouth daily.    . Chlorpheniramine Maleate (ALLERGY PO) Take 1 tablet by mouth daily as needed (allergies). Reported on 02/19/2016    . diclofenac sodium (VOLTAREN) 1 % GEL Apply 2 g topically 4 (four) times daily. 100 g 0  . HYDROcodone-acetaminophen (NORCO) 7.5-325 MG tablet Take 1 tablet by mouth every 6 (six) hours as needed for moderate pain. Must last 30 days 60 tablet 0  . methylPREDNISolone (MEDROL DOSEPAK) 4 MG TBPK tablet Take as directed 21 tablet 0  . tamsulosin (FLOMAX) 0.4 MG CAPS capsule      Current Facility-Administered Medications  Medication Dose Route Frequency Provider Last Rate Last Admin  . lidocaine  (XYLOCAINE) 1 % (with pres) injection 8 mL  8 mL Infiltration Once Hilts, Michael, MD      . methylPREDNISolone acetate (DEPO-MEDROL) injection 40 mg  40 mg Intra-articular Once Hilts, Michael, MD         Physical Exam  Blood pressure 138/80, pulse 80, temperature (!) 97.2 F (36.2 C), height 6\' 6"  (1.981 m), weight 210 lb (95.3 kg).  Constitutional: overall normal hygiene, normal nutrition, well developed, normal grooming, normal body habitus. Assistive device:none  Musculoskeletal: gait and station Limp none, muscle tone and strength are normal, no tremors or atrophy is present.  .  Neurological: coordination overall normal.  Deep tendon reflex/nerve stretch intact.  Sensation normal.  Cranial nerves II-XII intact.   Skin:   Normal overall no scars, lesions, ulcers or rashes. No psoriasis.  Psychiatric: Alert and oriented x 3.  Recent memory intact, remote memory unclear.  Normal mood and affect. Well groomed.  Good eye contact.  Cardiovascular: overall no swelling, no varicosities, no edema bilaterally, normal temperatures of the legs and arms, no clubbing, cyanosis and good capillary refill.  Lymphatic: palpation is normal.  All other systems reviewed and are negative   Left shoulder is tender with pain in the extremes.  NV intact.  The right little finger has duputyrens contracture, more at the MCP joint and involving some of the PIP joint.   The patient has been educated about the nature of the problem(s) and counseled on treatment options.  The patient appeared to understand what I have discussed and is in agreement with it.  Encounter Diagnoses  Name Primary?  . Chronic left shoulder pain Yes  . Cigarette nicotine dependence without complication     PLAN Call if any problems.  Precautions discussed.  Continue current medications.   Return to clinic 3 months   To hand surgeon.  I have reviewed the [place] web site  prior to prescribing narcotic medicine for this patient.   Electronically Signed Sanjuana Kava, MD 12/22/20209:46 AM

## 2019-09-24 DIAGNOSIS — M1990 Unspecified osteoarthritis, unspecified site: Secondary | ICD-10-CM | POA: Diagnosis not present

## 2019-09-24 DIAGNOSIS — I10 Essential (primary) hypertension: Secondary | ICD-10-CM | POA: Diagnosis not present

## 2019-10-09 ENCOUNTER — Telehealth: Payer: Self-pay | Admitting: Orthopaedic Surgery

## 2019-10-09 MED ORDER — HYDROCODONE-ACETAMINOPHEN 7.5-325 MG PO TABS: 1.0000 | ORAL_TABLET | Freq: Four times a day (QID) | ORAL | 0 refills | Status: DC | PRN

## 2019-10-09 NOTE — Telephone Encounter (Signed)
Patient requests refill on Hydrocodone/Acegtaminophen 7.5-325  Mgs.   Qty  60  Sig: Take 1 tablet by mouth every 6 (six) hours as needed for moderate pain. Must last 30 days         Patient states he uses [facility]

## 2019-10-30 DIAGNOSIS — M720 Palmar fascial fibromatosis [Dupuytren]: Secondary | ICD-10-CM | POA: Diagnosis not present

## 2019-11-05 ENCOUNTER — Other Ambulatory Visit: Payer: Self-pay

## 2019-11-05 DIAGNOSIS — M720 Palmar fascial fibromatosis [Dupuytren]: Secondary | ICD-10-CM | POA: Diagnosis not present

## 2019-11-09 ENCOUNTER — Other Ambulatory Visit: Payer: Self-pay | Admitting: Radiology

## 2019-11-09 MED ORDER — HYDROCODONE-ACETAMINOPHEN 7.5-325 MG PO TABS: 1.0000 | ORAL_TABLET | Freq: Four times a day (QID) | ORAL | 0 refills | Status: DC | PRN

## 2019-11-09 NOTE — Telephone Encounter (Signed)
Patient called, LM x 2 requesting refill of his medication.  Rx request attached.

## 2019-11-12 DIAGNOSIS — M720 Palmar fascial fibromatosis [Dupuytren]: Secondary | ICD-10-CM | POA: Diagnosis not present

## 2019-11-12 DIAGNOSIS — M79641 Pain in right hand: Secondary | ICD-10-CM | POA: Diagnosis not present

## 2019-11-15 DIAGNOSIS — T50905S Adverse effect of unspecified drugs, medicaments and biological substances, sequela: Secondary | ICD-10-CM | POA: Diagnosis not present

## 2019-11-15 DIAGNOSIS — Z1389 Encounter for screening for other disorder: Secondary | ICD-10-CM | POA: Diagnosis not present

## 2019-11-15 DIAGNOSIS — F17210 Nicotine dependence, cigarettes, uncomplicated: Secondary | ICD-10-CM | POA: Diagnosis not present

## 2019-11-15 DIAGNOSIS — Z1331 Encounter for screening for depression: Secondary | ICD-10-CM | POA: Diagnosis not present

## 2019-11-15 DIAGNOSIS — Z0001 Encounter for general adult medical examination with abnormal findings: Secondary | ICD-10-CM | POA: Diagnosis not present

## 2019-11-15 DIAGNOSIS — I10 Essential (primary) hypertension: Secondary | ICD-10-CM | POA: Diagnosis not present

## 2019-11-15 DIAGNOSIS — F17200 Nicotine dependence, unspecified, uncomplicated: Secondary | ICD-10-CM | POA: Diagnosis not present

## 2019-11-15 DIAGNOSIS — I739 Peripheral vascular disease, unspecified: Secondary | ICD-10-CM | POA: Diagnosis not present

## 2019-11-23 DIAGNOSIS — M720 Palmar fascial fibromatosis [Dupuytren]: Secondary | ICD-10-CM | POA: Diagnosis not present

## 2019-11-23 DIAGNOSIS — M25641 Stiffness of right hand, not elsewhere classified: Secondary | ICD-10-CM | POA: Diagnosis not present

## 2019-11-23 DIAGNOSIS — M79641 Pain in right hand: Secondary | ICD-10-CM | POA: Diagnosis not present

## 2019-11-30 DIAGNOSIS — M79641 Pain in right hand: Secondary | ICD-10-CM | POA: Diagnosis not present

## 2019-11-30 DIAGNOSIS — M25641 Stiffness of right hand, not elsewhere classified: Secondary | ICD-10-CM | POA: Diagnosis not present

## 2019-11-30 DIAGNOSIS — M720 Palmar fascial fibromatosis [Dupuytren]: Secondary | ICD-10-CM | POA: Diagnosis not present

## 2019-12-07 ENCOUNTER — Telehealth: Payer: Self-pay | Admitting: Orthopaedic Surgery

## 2019-12-07 NOTE — Telephone Encounter (Signed)
Patient called for refill (reminded of refill protocol to contact office before noon on Thursdays):   HYDROcodone-acetaminophen (NORCO) 7.5-325 MG tablet 60 tablet  -[place]

## 2019-12-10 NOTE — Telephone Encounter (Signed)
Will do this when I see him tomorrow.

## 2019-12-10 NOTE — Telephone Encounter (Signed)
Noted patient aware

## 2019-12-11 ENCOUNTER — Ambulatory Visit: Payer: PPO | Admitting: Orthopaedic Surgery

## 2019-12-11 ENCOUNTER — Encounter: Payer: Self-pay | Admitting: Orthopaedic Surgery

## 2019-12-11 ENCOUNTER — Other Ambulatory Visit: Payer: Self-pay

## 2019-12-11 VITALS — BP 151/91 | HR 89 | Temp 97.5°F | Ht 78.5 in | Wt 212.1 lb

## 2019-12-11 DIAGNOSIS — M25512 Pain in left shoulder: Secondary | ICD-10-CM

## 2019-12-11 DIAGNOSIS — G8929 Other chronic pain: Secondary | ICD-10-CM

## 2019-12-11 DIAGNOSIS — F17210 Nicotine dependence, cigarettes, uncomplicated: Secondary | ICD-10-CM

## 2019-12-11 MED ORDER — HYDROCODONE-ACETAMINOPHEN 7.5-325 MG PO TABS: 1.0000 | ORAL_TABLET | Freq: Four times a day (QID) | ORAL | 0 refills | Status: DC | PRN

## 2019-12-11 NOTE — Patient Instructions (Signed)

## 2019-12-11 NOTE — Progress Notes (Signed)
Patient MD:[id number] Larry Reese, male DOB:04-Dec-1945, 74 y.o. LA:[id number]  Chief Complaint  Patient presents with  . Shoulder Pain    L/ about the same/sometimes worse    HPI  Larry Reese is a 74 y.o. male who has chronic pain of the left shoulder.  He is doing his exercises.  He has more pain with cold days and overhead use.  He has no paresthesias.  He has no new trauma.   Body mass index is 24.2 kg/m.  ROS  Review of Systems  Constitutional: Positive for activity change.  HENT: Negative for congestion.   Respiratory: Positive for cough. Negative for shortness of breath.   Cardiovascular: Negative for chest pain.  Endocrine: Positive for cold intolerance.  Musculoskeletal: Positive for arthralgias, back pain, myalgias and neck pain.  Allergic/Immunologic: Negative for environmental allergies.  All other systems reviewed and are negative.   All other systems reviewed and are negative.  The following is a summary of the past history medically, past history surgically, known current medicines, social history and family history.  This information is gathered electronically by the computer from prior information and documentation.  I review this each visit and have found including this information at this point in the chart is beneficial and informative.    Past Medical History:  Diagnosis Date  . Arthritis   . Hx of adenomatous colonic polyps   . Hypertension   . Pneumothorax   . Stroke [facility]) 2005   left sided weakness  . Trigger finger     Past Surgical History:  Procedure Laterality Date  . COLONOSCOPY  May 2010   Dr. fields: 5 cm terminal ileum normal, 6 polyps removed, moderate internal hemorrhoids, simple adenomas, next colonoscopy May 2015  . COLONOSCOPY N/A 08/01/2015   Procedure: COLONOSCOPY;  Surgeon: Danie Binder, MD;  Location: [facility];  Service: Endoscopy;  Laterality: N/A;  0830  . CRANIOTOMY  2000   pressure  . INGUINAL HERNIA REPAIR Right    . OLECRANON BURSECTOMY Right 02/11/2015   Procedure: EXCISION RIGHT OLECRANON BURSA;  Surgeon: Sanjuana Kava, MD;  Location: [facility] ORS;  Service: Orthopedics;  Laterality: Right;  . repair of trigger finger Left    pinky    Family History  Problem Relation Age of Onset  . Esophageal cancer Father   . Colon cancer Neg Hx     Social History Social History   Tobacco Use  . Smoking status: Current Every Day Smoker    Packs/day: 0.25    Years: 50.00    Pack years: 12.50    Types: Cigarettes  . Smokeless tobacco: Never Used  . Tobacco comment: 5 cigarettes per day  Substance Use Topics  . Alcohol use: Yes    Alcohol/week: 5.0 standard drinks    Types: 5 Cans of beer per week    Comment: 3-4 beers/day  . Drug use: No    Allergies  Allergen Reactions  . Aleve [Naproxen] Other (See Comments)    Numbness in extremities   . Celecoxib Other (See Comments)    Celebrex. Nose bleed.  . Fluoxetine Hcl Swelling    Current Outpatient Medications  Medication Sig Dispense Refill  . ALPRAZolam (XANAX) 0.5 MG tablet Take 0.5 mg by mouth at bedtime as needed for sleep. Reported on 02/19/2016    . amLODipine (NORVASC) 10 MG tablet Take 10 mg by mouth daily.    Marland Kitchen aspirin EC 81 MG tablet Take 81 mg by mouth daily.    Marland Kitchen  diclofenac sodium (VOLTAREN) 1 % GEL Apply 2 g topically 4 (four) times daily. 100 g 0  . HYDROcodone-acetaminophen (NORCO) 7.5-325 MG tablet Take 1 tablet by mouth every 6 (six) hours as needed for moderate pain. Must last 30 days 60 tablet 0  . methylPREDNISolone (MEDROL DOSEPAK) 4 MG TBPK tablet Take as directed 21 tablet 0  . tamsulosin (FLOMAX) 0.4 MG CAPS capsule     . Chlorpheniramine Maleate (ALLERGY PO) Take 1 tablet by mouth daily as needed (allergies). Reported on 02/19/2016     Current Facility-Administered Medications  Medication Dose Route Frequency Provider Last Rate Last Admin  . lidocaine (XYLOCAINE) 1 % (with pres) injection 8 mL  8 mL Infiltration Once Hilts,  Michael, MD      . methylPREDNISolone acetate (DEPO-MEDROL) injection 40 mg  40 mg Intra-articular Once Hilts, Michael, MD         Physical Exam  Blood pressure (!) 151/91, pulse 89, temperature (!) 97.5 F (36.4 C), height 6' 6.5" (1.994 m), weight 212 lb 2 oz (96.2 kg).  Constitutional: overall normal hygiene, normal nutrition, well developed, normal grooming, normal body habitus. Assistive device:none  Musculoskeletal: gait and station Limp none, muscle tone and strength are normal, no tremors or atrophy is present.  .  Neurological: coordination overall normal.  Deep tendon reflex/nerve stretch intact.  Sensation normal.  Cranial nerves II-XII intact.   Skin:   Normal overall no scars, lesions, ulcers or rashes. No psoriasis.  Psychiatric: Alert and oriented x 3.  Recent memory intact, remote memory unclear.  Normal mood and affect. Well groomed.  Good eye contact.  Cardiovascular: overall no swelling, no varicosities, no edema bilaterally, normal temperatures of the legs and arms, no clubbing, cyanosis and good capillary refill.  Lymphatic: palpation is normal.  All other systems reviewed and are negative   Examination of left Upper Extremity is done.  Inspection:   Overall:  Elbow non-tender without crepitus or defects, forearm non-tender without crepitus or defects, wrist non-tender without crepitus or defects, hand non-tender.    Shoulder: with glenohumeral joint tenderness, without effusion.   Upper arm:  without swelling and tenderness   Range of motion:   Overall:  Full range of motion of the elbow, full range of motion of wrist and full range of motion in fingers.   Shoulder:  left  150 degrees forward flexion; 140 degrees abduction; 30 degrees internal rotation, 30 degrees external rotation, 10 degrees extension, 35 degrees adduction.   Stability:   Overall:  Shoulder, elbow and wrist stable   Strength and Tone:   Overall full shoulder muscles strength, full  upper arm strength and normal upper arm bulk and tone. The patient has been educated about the nature of the problem(s) and counseled on treatment options.  The patient appeared to understand what I have discussed and is in agreement with it.  Encounter Diagnoses  Name Primary?  . Chronic left shoulder pain Yes  . Cigarette nicotine dependence without complication     PLAN Call if any problems.  Precautions discussed.  Continue current medications.   Return to clinic 3 months   I have reviewed the [place] web site prior to prescribing narcotic medicine for this patient.   Electronically Signed Sanjuana Kava, MD 3/23/202110:34 AM

## 2019-12-13 DIAGNOSIS — M549 Dorsalgia, unspecified: Secondary | ICD-10-CM | POA: Diagnosis not present

## 2019-12-13 DIAGNOSIS — M1990 Unspecified osteoarthritis, unspecified site: Secondary | ICD-10-CM | POA: Diagnosis not present

## 2019-12-14 DIAGNOSIS — I10 Essential (primary) hypertension: Secondary | ICD-10-CM | POA: Diagnosis not present

## 2019-12-14 DIAGNOSIS — Z0001 Encounter for general adult medical examination with abnormal findings: Secondary | ICD-10-CM | POA: Diagnosis not present

## 2019-12-14 DIAGNOSIS — I739 Peripheral vascular disease, unspecified: Secondary | ICD-10-CM | POA: Diagnosis not present

## 2020-01-10 ENCOUNTER — Telehealth: Payer: Self-pay | Admitting: Orthopaedic Surgery

## 2020-01-10 MED ORDER — HYDROCODONE-ACETAMINOPHEN 7.5-325 MG PO TABS: 1.0000 | ORAL_TABLET | Freq: Four times a day (QID) | ORAL | 0 refills | Status: DC | PRN

## 2020-01-10 NOTE — Telephone Encounter (Signed)
Patient requests refill on Hydrocodone/Acetaminophen 7.5-325  Mgs.  Qty  60  Sig: Take 1 tablet by mouth every 6 (six) hours as needed for moderate pain. Must last 30 days  Patient uses [facility]

## 2020-01-13 DIAGNOSIS — M1990 Unspecified osteoarthritis, unspecified site: Secondary | ICD-10-CM | POA: Diagnosis not present

## 2020-01-13 DIAGNOSIS — I10 Essential (primary) hypertension: Secondary | ICD-10-CM | POA: Diagnosis not present

## 2020-02-07 ENCOUNTER — Telehealth: Payer: Self-pay | Admitting: Orthopaedic Surgery

## 2020-02-07 MED ORDER — HYDROCODONE-ACETAMINOPHEN 7.5-325 MG PO TABS: 1.0000 | ORAL_TABLET | Freq: Four times a day (QID) | ORAL | 0 refills | Status: DC | PRN

## 2020-02-07 NOTE — Telephone Encounter (Signed)
Patient called for refill: HYDROcodone-acetaminophen (NORCO) 7.5-325 MG tablet 60 tablet  -[place]

## 2020-02-12 DIAGNOSIS — I10 Essential (primary) hypertension: Secondary | ICD-10-CM | POA: Diagnosis not present

## 2020-02-12 DIAGNOSIS — K769 Liver disease, unspecified: Secondary | ICD-10-CM | POA: Diagnosis not present

## 2020-03-11 ENCOUNTER — Other Ambulatory Visit: Payer: Self-pay

## 2020-03-11 ENCOUNTER — Ambulatory Visit (INDEPENDENT_AMBULATORY_CARE_PROVIDER_SITE_OTHER): Payer: PPO | Admitting: Orthopaedic Surgery

## 2020-03-11 ENCOUNTER — Encounter: Payer: Self-pay | Admitting: Orthopaedic Surgery

## 2020-03-11 VITALS — BP 127/79 | HR 71 | Ht 78.0 in | Wt 202.0 lb

## 2020-03-11 DIAGNOSIS — G8929 Other chronic pain: Secondary | ICD-10-CM | POA: Diagnosis not present

## 2020-03-11 DIAGNOSIS — M25512 Pain in left shoulder: Secondary | ICD-10-CM

## 2020-03-11 DIAGNOSIS — F17210 Nicotine dependence, cigarettes, uncomplicated: Secondary | ICD-10-CM

## 2020-03-11 MED ORDER — HYDROCODONE-ACETAMINOPHEN 7.5-325 MG PO TABS: 1.0000 | ORAL_TABLET | Freq: Four times a day (QID) | ORAL | 0 refills | Status: DC | PRN

## 2020-03-11 NOTE — Progress Notes (Signed)
Patient FU:Larry Reese, male DOB:1946/07/08, 74 y.o. TDD:[id number]  Chief Complaint  Patient presents with  . Shoulder Pain    left    HPI  Larry Reese is a 74 y.o. male who has chronic pain of the left shoulder.  He has no new trauma.  He has no paresthesias or weakness.  He is doing his exercises and taking his medicine.     Body mass index is 23.34 kg/m.  ROS  Review of Systems  Constitutional: Positive for activity change.  HENT: Negative for congestion.   Respiratory: Positive for cough. Negative for shortness of breath.   Cardiovascular: Negative for chest pain.  Endocrine: Positive for cold intolerance.  Musculoskeletal: Positive for arthralgias, back pain, myalgias and neck pain.  Allergic/Immunologic: Negative for environmental allergies.  All other systems reviewed and are negative.   All other systems reviewed and are negative.  The following is a summary of the past history medically, past history surgically, known current medicines, social history and family history.  This information is gathered electronically by the computer from prior information and documentation.  I review this each visit and have found including this information at this point in the chart is beneficial and informative.    Past Medical History:  Diagnosis Date  . Arthritis   . Hx of adenomatous colonic polyps   . Hypertension   . Pneumothorax   . Stroke [facility]) 2005   left sided weakness  . Trigger finger     Past Surgical History:  Procedure Laterality Date  . COLONOSCOPY  May 2010   Dr. fields: 5 cm terminal ileum normal, 6 polyps removed, moderate internal hemorrhoids, simple adenomas, next colonoscopy May 2015  . COLONOSCOPY N/A 08/01/2015   Procedure: COLONOSCOPY;  Surgeon: Danie Binder, MD;  [facility];  Service: Endoscopy;  Laterality: N/A;  0830  . CRANIOTOMY  2000   pressure  . INGUINAL HERNIA REPAIR Right   . OLECRANON BURSECTOMY Right 02/11/2015    Procedure: EXCISION RIGHT OLECRANON BURSA;  Surgeon: Sanjuana Kava, MD;  Location: [facility];  Service: Orthopedics;  Laterality: Right;  . repair of trigger finger Left    pinky    Family History  Problem Relation Age of Onset  . Esophageal cancer Father   . Colon cancer Neg Hx     Social History Social History   Tobacco Use  . Smoking status: Current Every Day Smoker    Packs/day: 0.25    Years: 50.00    Pack years: 12.50    Types: Cigarettes  . Smokeless tobacco: Never Used  . Tobacco comment: 5 cigarettes per day  Vaping Use  . Vaping Use: Never used  Substance Use Topics  . Alcohol use: Yes    Alcohol/week: 5.0 standard drinks    Types: 5 Cans of beer per week    Comment: 3-4 beers/day  . Drug use: No    Allergies  Allergen Reactions  . Aleve [Naproxen] Other (See Comments)    Numbness in extremities   . Celecoxib Other (See Comments)    Celebrex. Nose bleed.  . Fluoxetine Hcl Swelling    Current Outpatient Medications  Medication Sig Dispense Refill  . ALPRAZolam (XANAX) 0.5 MG tablet Take 0.5 mg by mouth at bedtime as needed for sleep. Reported on 02/19/2016    . amLODipine (NORVASC) 10 MG tablet Take 10 mg by mouth daily.    Marland Kitchen aspirin EC 81 MG tablet Take 81 mg by mouth daily.    Marland Kitchen  Chlorpheniramine Maleate (ALLERGY PO) Take 1 tablet by mouth daily as needed (allergies). Reported on 02/19/2016    . diclofenac sodium (VOLTAREN) 1 % GEL Apply 2 g topically 4 (four) times daily. 100 g 0  . HYDROcodone-acetaminophen (NORCO) 7.5-325 MG tablet Take 1 tablet by mouth every 6 (six) hours as needed for moderate pain. Must last 30 days 60 tablet 0  . tamsulosin (FLOMAX) 0.4 MG CAPS capsule      Current Facility-Administered Medications  Medication Dose Route Frequency Provider Last Rate Last Admin  . lidocaine (XYLOCAINE) 1 % (with pres) injection 8 mL  8 mL Infiltration Once Hilts, Michael, MD      . methylPREDNISolone acetate (DEPO-MEDROL) injection 40 mg  40 mg  Intra-articular Once Hilts, Michael, MD         Physical Exam  Blood pressure 127/79, pulse 71, height 6\' 6"  (1.981 m), weight 202 lb (91.6 kg).  Constitutional: overall normal hygiene, normal nutrition, well developed, normal grooming, normal body habitus. Assistive device:none  Musculoskeletal: gait and station Limp none, muscle tone and strength are normal, no tremors or atrophy is present.  .  Neurological: coordination overall normal.  Deep tendon reflex/nerve stretch intact.  Sensation normal.  Cranial nerves II-XII intact.   Skin:   Normal overall no scars, lesions, ulcers or rashes. No psoriasis.  Psychiatric: Alert and oriented x 3.  Recent memory intact, remote memory unclear.  Normal mood and affect. Well groomed.  Good eye contact.  Cardiovascular: overall no swelling, no varicosities, no edema bilaterally, normal temperatures of the legs and arms, no clubbing, cyanosis and good capillary refill.  Lymphatic: palpation is normal.  He has painful ROM of the left shoulder.  NV intact.  All other systems reviewed and are negative   The patient has been educated about the nature of the problem(s) and counseled on treatment options.  The patient appeared to understand what I have discussed and is in agreement with it.  Encounter Diagnoses  Name Primary?  . Chronic left shoulder pain Yes  . Cigarette nicotine dependence without complication     PLAN Call if any problems.  Precautions discussed.  Continue current medications.   Return to clinic 3 months   I have reviewed the [place] web site prior to prescribing narcotic medicine for this patient.   Electronically Signed Sanjuana Kava, MD 6/22/202110:22 AM

## 2020-03-14 DIAGNOSIS — M1990 Unspecified osteoarthritis, unspecified site: Secondary | ICD-10-CM | POA: Diagnosis not present

## 2020-03-14 DIAGNOSIS — N400 Enlarged prostate without lower urinary tract symptoms: Secondary | ICD-10-CM | POA: Diagnosis not present

## 2020-04-08 ENCOUNTER — Telehealth: Payer: Self-pay

## 2020-04-08 MED ORDER — HYDROCODONE-ACETAMINOPHEN 7.5-325 MG PO TABS: 1.0000 | ORAL_TABLET | Freq: Four times a day (QID) | ORAL | 0 refills | Status: DC | PRN

## 2020-04-08 NOTE — Telephone Encounter (Signed)
Hydrocodone-Acetaminophen 7.5/325mg  Qty 60 Tablets  PATIENT USES [facility] 

## 2020-04-13 DIAGNOSIS — I10 Essential (primary) hypertension: Secondary | ICD-10-CM | POA: Diagnosis not present

## 2020-04-13 DIAGNOSIS — M1990 Unspecified osteoarthritis, unspecified site: Secondary | ICD-10-CM | POA: Diagnosis not present

## 2020-04-16 ENCOUNTER — Ambulatory Visit (INDEPENDENT_AMBULATORY_CARE_PROVIDER_SITE_OTHER): Payer: PPO

## 2020-04-16 ENCOUNTER — Ambulatory Visit: Payer: PPO | Admitting: Orthopaedic Surgery

## 2020-04-16 ENCOUNTER — Encounter: Payer: Self-pay | Admitting: Orthopaedic Surgery

## 2020-04-16 VITALS — Ht 79.0 in | Wt 200.6 lb

## 2020-04-16 DIAGNOSIS — M87051 Idiopathic aseptic necrosis of right femur: Secondary | ICD-10-CM

## 2020-04-16 NOTE — Progress Notes (Signed)
Office Visit Note   Patient: Larry Reese           Date of Birth: [birth year]           MRN: [id number] Visit Date: 04/16/2020              Requested by: [name], MD [address] [place],   [id number] PCP: [name], MD   Assessment & Plan: Visit Diagnoses:  1. Avascular necrosis of bone of right hip (HCC)     Plan: Impression is end-stage DJD right hip.  I reviewed the x-rays with the patient in detail and we had a lengthy discussion on treatment options and their respective likelihood of giving permanent relief.  Based on this discussion he has elected to proceed with a right total hip replacement.  Risk benefits rehab recovery reviewed in detail.  We look forward to treating the patient in the operating room in the near future.  Follow-Up Instructions: Return if symptoms worsen or fail to improve.   Orders:  Orders Placed This Encounter  Procedures  . XR HIP UNILAT W OR W/O PELVIS 2-3 VIEWS RIGHT   No orders of the defined types were placed in this encounter.     Procedures: No procedures performed   Clinical Data: No additional findings.   Subjective: Chief Complaint  Patient presents with  . Right Hip - Pain    Larry Reese is a 74 year old gentleman who comes in for chronic right hip pain and leg weakness.  He has had a severe decline in pain and symptoms.  He cannot pick his leg up into his bed or into a car.  He has had significant pain with any ambulation or weightbearing.  He is currently using a cane and sometimes a walker.  He has had a previous injection which gave him fairly good relief.  He denies any radicular symptoms.   Review of Systems  Constitutional: Negative.   All other systems reviewed and are negative.    Objective: Vital Signs: Ht 6\' 7"  (2.007 m)   Wt 200 lb 9.6 oz (91 kg)   BMI 22.60 kg/m   Physical Exam Vitals and nursing note reviewed.  Constitutional:      Appearance: He is well-developed.    HENT:     Head: Normocephalic and atraumatic.  Eyes:     Pupils: Pupils are equal, round, and reactive to light.  Pulmonary:     Effort: Pulmonary effort is normal.  Abdominal:     Palpations: Abdomen is soft.  Musculoskeletal:        General: Normal range of motion.     Cervical back: Neck supple.  Skin:    General: Skin is warm.  Neurological:     Mental Status: He is alert and oriented to person, place, and time.  Psychiatric:        Behavior: Behavior normal.        Thought Content: Thought content normal.        Judgment: Judgment normal.     Ortho Exam Right hip shows severe pain with any internal and external rotation.  Unable to perform Stinchfield sign secondary to pain. Specialty Comments:  No specialty comments available.  Imaging: XR HIP UNILAT W OR W/O PELVIS 2-3 VIEWS RIGHT  Result Date: 04/16/2020 Advanced right hip DJD    PMFS History: Patient Active Problem List   Diagnosis Date Noted  . Chronic toe pain, right foot 02/20/2019  . Hematoma of left hip  02/20/2019  . Avascular necrosis of bone of right hip ([place]) 01/23/2019  . Pain in left hip 01/23/2019  . Rib pain on left side 01/23/2019  . History of colonic polyps   . Hepatitis, alcoholic 35/57/3220  . ETOH abuse 02/10/2015  . Internal hemorrhoids 07/17/2009  . Colon adenomas 07/15/2009   Past Medical History:  Diagnosis Date  . Arthritis   . Hx of adenomatous colonic polyps   . Hypertension   . Pneumothorax   . Stroke [facility]) 2005   left sided weakness  . Trigger finger     Family History  Problem Relation Age of Onset  . Esophageal cancer Father   . Colon cancer Neg Hx     Past Surgical History:  Procedure Laterality Date  . COLONOSCOPY  May 2010   Dr. fields: 5 cm terminal ileum normal, 6 polyps removed, moderate internal hemorrhoids, simple adenomas, next colonoscopy May 2015  . COLONOSCOPY N/A 08/01/2015   Procedure: COLONOSCOPY;  Surgeon: Danie Binder, MD;  Location[facility];  Service: Endoscopy;  Laterality: N/A;  0830  . CRANIOTOMY  2000   pressure  . INGUINAL HERNIA REPAIR Right   . OLECRANON BURSECTOMY Right 02/11/2015   Procedure: EXCISION RIGHT OLECRANON BURSA;  Surgeon: Sanjuana Kava, MD;  Location: [facility];  Service: Orthopedics;  Laterality: Right;  . repair of trigger finger Left    pinky   Social History   Occupational History  . Occupation: retired  Tobacco Use  . Smoking status: Current Every Day Smoker    Packs/day: 0.25    Years: 50.00    Pack years: 12.50    Types: Cigarettes  . Smokeless tobacco: Never Used  . Tobacco comment: 5 cigarettes per day  Vaping Use  . Vaping Use: Never used  Substance and Sexual Activity  . Alcohol use: Yes    Alcohol/week: 5.0 standard drinks    Types: 5 Cans of beer per week    Comment: 3-4 beers/day  . Drug use: No  . Sexual activity: Yes    Birth control/protection: None

## 2020-04-18 ENCOUNTER — Other Ambulatory Visit: Payer: Self-pay

## 2020-04-30 ENCOUNTER — Telehealth: Payer: Self-pay

## 2020-04-30 ENCOUNTER — Encounter (HOSPITAL_COMMUNITY): Payer: Self-pay

## 2020-04-30 DIAGNOSIS — F411 Generalized anxiety disorder: Secondary | ICD-10-CM | POA: Diagnosis not present

## 2020-04-30 DIAGNOSIS — I10 Essential (primary) hypertension: Secondary | ICD-10-CM | POA: Diagnosis not present

## 2020-04-30 DIAGNOSIS — I739 Peripheral vascular disease, unspecified: Secondary | ICD-10-CM | POA: Diagnosis not present

## 2020-04-30 DIAGNOSIS — F17210 Nicotine dependence, cigarettes, uncomplicated: Secondary | ICD-10-CM | POA: Diagnosis not present

## 2020-04-30 DIAGNOSIS — M1990 Unspecified osteoarthritis, unspecified site: Secondary | ICD-10-CM | POA: Diagnosis not present

## 2020-04-30 NOTE — Telephone Encounter (Signed)
OK.  I will be up to Dr. Erlinda Hong to give pain medicine after his surgery.  Let him know.

## 2020-04-30 NOTE — Telephone Encounter (Signed)
Patient is having Right Total Hip surgery at [facility] by Dr. Erlinda Hong on 05/05/20. He wanted to be sure that you was informed.

## 2020-04-30 NOTE — Progress Notes (Signed)
COVID Vaccine Completed: Date COVID Vaccine completed: COVID vaccine manufacturer: Pfizer    Moderna   [employer]   PCP - Dr. [name]  Cardiologist -   Chest x-ray -  EKG - 05/01/20 in epic (pre op) Stress Test -  ECHO -  Cardiac Cath -   Sleep Study -  CPAP -   Fasting Blood Sugar -  Checks Blood Sugar _____ times a day  Blood Thinner Instructions: Aspirin Instructions: Last Dose:  Anesthesia review: Stroke  Patient denies shortness of breath, fever, cough and chest pain at PAT appointment   Patient verbalized understanding of instructions that were given to them at the PAT appointment. Patient was also instructed that they will need to review over the PAT instructions again at home before surgery.

## 2020-04-30 NOTE — Patient Instructions (Signed)
DUE TO COVID-19 ONLY ONE VISITOR IS ALLOWED TO COME WITH YOU AND STAY IN THE WAITING ROOM ONLY DURING PRE OP AND PROCEDURE.   IF YOU WILL BE ADMITTED INTO THE HOSPITAL YOU ARE ALLOWED ONE SUPPORT PERSON DURING VISITATION HOURS ONLY (10AM -8PM)   . The support person may change daily. . The support person must pass our screening, gel in and out, and wear a mask at all times, including in the patient's room. . Patients must also wear a mask when staff or their support person are in the room.   COVID SWAB TESTING MUST BE COMPLETED ON:  Today, Aug. 12, 2021 at 1155AM   [address] [place], [place] [address]  (Must self quarantine after testing. Follow instructions on handout.)       Your procedure is scheduled on: Monday, Aug. 16, 2021   Report to [facility] Main  Entrance    Report to admitting at 10:05 AM   Call this number if you have problems the morning of surgery [phone]   Do not eat food :After Midnight.   May have liquids until 9:30 AM   day of surgery  CLEAR LIQUID DIET  Foods Allowed                                                                     Foods Excluded  Water, Black Coffee and tea, regular and decaf                             liquids that you cannot  Plain Jell-O in any flavor  (No red)                                           see through such as: Fruit ices (not with fruit pulp)                                     milk, soups, orange juice              Iced Popsicles (No red)                                    All solid food                                   Apple juices Sports drinks like Gatorade (No red) Lightly seasoned clear broth or consume(fat free) Sugar, honey syrup  Sample Menu Breakfast                                Lunch                                     Supper Cranberry  juice                    Beef broth                            Chicken broth Jell-O                                     Grape juice                            Apple juice Coffee or tea                        Jell-O                                      Popsicle                                                Coffee or tea                        Coffee or tea      Complete one Ensure drink the morning of surgery at  9:30 AM     the day of surgery.   Oral Hygiene is also important to reduce your risk of infection.                                    Remember - BRUSH YOUR TEETH THE MORNING OF SURGERY WITH YOUR REGULAR TOOTHPASTE   Do NOT smoke after Midnight   Take these medicines the morning of surgery with A SIP OF WATER: Amlodipine, Tamsulosin                               You may not have any metal on your body including jewelry, and body piercings             Do not wear lotions, powders, perfumes/cologne, or deodorant                           Men may shave face and neck.   Do not bring valuables to the hospital. Larry Reese.   Contacts, dentures or bridgework may not be worn into surgery.   Bring small overnight bag day of surgery.    Special Instructions: Bring a copy of your healthcare power of attorney and living will documents         the day of surgery if you haven't scanned them in before.              Please read over the following fact sheets you were given: IF [place] [phone]   [facility] - Preparing for Surgery Before surgery, you can  play an important role.  Because skin is not sterile, your skin needs to be as free of germs as possible.  You can reduce the number of germs on your skin by washing with CHG (chlorahexidine gluconate) soap before surgery.  CHG is an antiseptic cleaner which kills germs and bonds with the skin to continue killing germs even after washing. Please DO NOT use if you have an allergy to CHG or antibacterial soaps.  If your skin becomes reddened/irritated stop using the CHG and inform your nurse  when you arrive at Short Stay. Do not shave (including legs and underarms) for at least 48 hours prior to the first CHG shower.  You may shave your face/neck.  Please follow these instructions carefully:  1.  Shower with CHG Soap the night before surgery and the  morning of surgery.  2.  If you choose to wash your hair, wash your hair first as usual with your normal  shampoo.  3.  After you shampoo, rinse your hair and body thoroughly to remove the shampoo.                             4.  Use CHG as you would any other liquid soap.  You can apply chg directly to the skin and wash.  Gently with a scrungie or clean washcloth.  5.  Apply the CHG Soap to your body ONLY FROM THE NECK DOWN.   Do   not use on face/ open                           Wound or open sores. Avoid contact with eyes, ears mouth and   genitals (private parts).                       Wash face,  Genitals (private parts) with your normal soap.             6.  Wash thoroughly, paying special attention to the area where your    surgery  will be performed.  7.  Thoroughly rinse your body with warm water from the neck down.  8.  DO NOT shower/wash with your normal soap after using and rinsing off the CHG Soap.                9.  Pat yourself dry with a clean towel.            10.  Wear clean pajamas.            11.  Place clean sheets on your bed the night of your first shower and do not  sleep with pets. Day of Surgery : Do not apply any lotions/deodorants the morning of surgery.  Please wear clean clothes to the hospital/surgery center.  FAILURE TO FOLLOW THESE INSTRUCTIONS MAY RESULT IN THE CANCELLATION OF YOUR SURGERY  PATIENT SIGNATURE_________________________________  NURSE SIGNATURE__________________________________  ________________________________________________________________________   Larry Reese  An incentive spirometer is a tool that can help keep your lungs clear and active. This tool measures how well  you are filling your lungs with each breath. Taking long deep breaths may help reverse or decrease the chance of developing breathing (pulmonary) problems (especially infection) following:  A long period of time when you are unable to move or be active. BEFORE THE PROCEDURE   If the spirometer includes an indicator to show your  best effort, your nurse or respiratory therapist will set it to a desired goal.  If possible, sit up straight or lean slightly forward. Try not to slouch.  Hold the incentive spirometer in an upright position. INSTRUCTIONS FOR USE  1. Sit on the edge of your bed if possible, or sit up as far as you can in bed or on a chair. 2. Hold the incentive spirometer in an upright position. 3. Breathe out normally. 4. Place the mouthpiece in your mouth and seal your lips tightly around it. 5. Breathe in slowly and as deeply as possible, raising the piston or the ball toward the top of the column. 6. Hold your breath for 3-5 seconds or for as long as possible. Allow the piston or ball to fall to the bottom of the column. 7. Remove the mouthpiece from your mouth and breathe out normally. 8. Rest for a few seconds and repeat Steps 1 through 7 at least 10 times every 1-2 hours when you are awake. Take your time and take a few normal breaths between deep breaths. 9. The spirometer may include an indicator to show your best effort. Use the indicator as a goal to work toward during each repetition. 10. After each set of 10 deep breaths, practice coughing to be sure your lungs are clear. If you have an incision (the cut made at the time of surgery), support your incision when coughing by placing a pillow or rolled up towels firmly against it. Once you are able to get out of bed, walk around indoors and cough well. You may stop using the incentive spirometer when instructed by your caregiver.  RISKS AND COMPLICATIONS  Take your time so you do not get dizzy or light-headed.  If you are  in pain, you may need to take or ask for pain medication before doing incentive spirometry. It is harder to take a deep breath if you are having pain. AFTER USE  Rest and breathe slowly and easily.  It can be helpful to keep track of a log of your progress. Your caregiver can provide you with a simple table to help with this. If you are using the spirometer at home, follow these instructions: [place] IF:   You are having difficultly using the spirometer.  You have trouble using the spirometer as often as instructed.  Your pain medication is not giving enough relief while using the spirometer.  You develop fever of 100.5 F (38.1 C) or higher. SEEK IMMEDIATE MEDICAL CARE IF:   You cough up bloody sputum that had not been present before.  You develop fever of 102 F (38.9 C) or greater.  You develop worsening pain at or near the incision site. MAKE SURE YOU:   Understand these instructions.  Will watch your condition.  Will get help right away if you are not doing well or get worse. Document Released: 01/17/2007 Document Revised: 11/29/2011 Document Reviewed: 03/20/2007 [employer] Patient Information 2014 [employer], [place].   ________________________________________________________________________  WHAT IS A BLOOD TRANSFUSION? Blood Transfusion Information  A transfusion is the replacement of blood or some of its parts. Blood is made up of multiple cells which provide different functions.  Red blood cells carry oxygen and are used for blood loss replacement.  White blood cells fight against infection.  Platelets control bleeding.  Plasma helps clot blood.  Other blood products are available for specialized needs, such as hemophilia or other clotting disorders. BEFORE THE TRANSFUSION  Who gives blood for transfusions?  Healthy volunteers who are fully evaluated to make sure their blood is safe. This is blood bank blood. Transfusion therapy is the safest it  has ever been in the practice of medicine. Before blood is taken from a donor, a complete history is taken to make sure that person has no history of diseases nor engages in risky social behavior (examples are intravenous drug use or sexual activity with multiple partners). The donor's travel history is screened to minimize risk of transmitting infections, such as malaria. The donated blood is tested for signs of infectious diseases, such as HIV and hepatitis. The blood is then tested to be sure it is compatible with you in order to minimize the chance of a transfusion reaction. If you or a relative donates blood, this is often done in anticipation of surgery and is not appropriate for emergency situations. It takes many days to process the donated blood. RISKS AND COMPLICATIONS Although transfusion therapy is very safe and saves many lives, the main dangers of transfusion include:   Getting an infectious disease.  Developing a transfusion reaction. This is an allergic reaction to something in the blood you were given. Every precaution is taken to prevent this. The decision to have a blood transfusion has been considered carefully by your caregiver before blood is given. Blood is not given unless the benefits outweigh the risks. AFTER THE TRANSFUSION  Right after receiving a blood transfusion, you will usually feel much better and more energetic. This is especially true if your red blood cells have gotten low (anemic). The transfusion raises the level of the red blood cells which carry oxygen, and this usually causes an energy increase.  The nurse administering the transfusion will monitor you carefully for complications. HOME CARE INSTRUCTIONS  No special instructions are needed after a transfusion. You may find your energy is better. Speak with your caregiver about any limitations on activity for underlying diseases you may have. SEEK MEDICAL CARE IF:   Your condition is not improving after your  transfusion.  You develop redness or irritation at the intravenous (IV) site. SEEK IMMEDIATE MEDICAL CARE IF:  Any of the following symptoms occur over the next 12 hours:  Shaking chills.  You have a temperature by mouth above 102 F (38.9 C), not controlled by medicine.  Chest, back, or muscle pain.  People around you feel you are not acting correctly or are confused.  Shortness of breath or difficulty breathing.  Dizziness and fainting.  You get a rash or develop hives.  You have a decrease in urine output.  Your urine turns a dark color or changes to pink, red, or brown. Any of the following symptoms occur over the next 10 days:  You have a temperature by mouth above 102 F (38.9 C), not controlled by medicine.  Shortness of breath.  Weakness after normal activity.  The white part of the eye turns yellow (jaundice).  You have a decrease in the amount of urine or are urinating less often.  Your urine turns a dark color or changes to pink, red, or brown. Document Released: 09/03/2000 Document Revised: 11/29/2011 Document Reviewed: 04/22/2008 [facility] Patient Information 2014 [place], [place].  _______________________________________________________________________

## 2020-05-01 ENCOUNTER — Encounter (HOSPITAL_COMMUNITY): Payer: Self-pay | Admitting: Physician Assistant

## 2020-05-01 ENCOUNTER — Other Ambulatory Visit: Payer: Self-pay

## 2020-05-01 ENCOUNTER — Encounter (HOSPITAL_COMMUNITY)
Admission: RE | Admit: 2020-05-01 | Discharge: 2020-05-01 | Disposition: A | Discharge status: Home / Self Care | Payer: PPO | Source: Ambulatory Visit | Attending: Orthopaedic Surgery | Admitting: Orthopaedic Surgery

## 2020-05-01 ENCOUNTER — Other Ambulatory Visit (HOSPITAL_COMMUNITY): Payer: PPO

## 2020-05-01 ENCOUNTER — Ambulatory Visit (HOSPITAL_COMMUNITY)
Admission: RE | Admit: 2020-05-01 | Discharge: 2020-05-01 | Disposition: A | Discharge status: Home / Self Care | Payer: PPO | Source: Ambulatory Visit | Attending: Physician Assistant | Admitting: Physician Assistant

## 2020-05-01 ENCOUNTER — Encounter (HOSPITAL_COMMUNITY): Payer: Self-pay

## 2020-05-01 ENCOUNTER — Other Ambulatory Visit (HOSPITAL_COMMUNITY)
Admission: RE | Admit: 2020-05-01 | Discharge: 2020-05-01 | Disposition: A | Discharge status: Home / Self Care | Payer: PPO | Source: Ambulatory Visit | Attending: Orthopaedic Surgery | Admitting: Orthopaedic Surgery

## 2020-05-01 DIAGNOSIS — Z79899 Other long term (current) drug therapy: Secondary | ICD-10-CM | POA: Insufficient documentation

## 2020-05-01 DIAGNOSIS — Z20822 Contact with and (suspected) exposure to covid-19: Secondary | ICD-10-CM | POA: Insufficient documentation

## 2020-05-01 DIAGNOSIS — I10 Essential (primary) hypertension: Secondary | ICD-10-CM | POA: Diagnosis not present

## 2020-05-01 DIAGNOSIS — F17210 Nicotine dependence, cigarettes, uncomplicated: Secondary | ICD-10-CM | POA: Diagnosis not present

## 2020-05-01 DIAGNOSIS — M1611 Unilateral primary osteoarthritis, right hip: Secondary | ICD-10-CM | POA: Diagnosis not present

## 2020-05-01 DIAGNOSIS — Z7982 Long term (current) use of aspirin: Secondary | ICD-10-CM | POA: Diagnosis not present

## 2020-05-01 DIAGNOSIS — J449 Chronic obstructive pulmonary disease, unspecified: Secondary | ICD-10-CM | POA: Diagnosis not present

## 2020-05-01 DIAGNOSIS — Z01818 Encounter for other preprocedural examination: Secondary | ICD-10-CM | POA: Diagnosis not present

## 2020-05-01 DIAGNOSIS — I4891 Unspecified atrial fibrillation: Secondary | ICD-10-CM | POA: Diagnosis not present

## 2020-05-01 DIAGNOSIS — Z8673 Personal history of transient ischemic attack (TIA), and cerebral infarction without residual deficits: Secondary | ICD-10-CM | POA: Diagnosis not present

## 2020-05-01 DIAGNOSIS — J811 Chronic pulmonary edema: Secondary | ICD-10-CM | POA: Diagnosis not present

## 2020-05-01 DIAGNOSIS — Z01812 Encounter for preprocedural laboratory examination: Secondary | ICD-10-CM | POA: Insufficient documentation

## 2020-05-01 HISTORY — DX: Palmar fascial fibromatosis (dupuytren): M72.0

## 2020-05-01 HISTORY — DX: Anemia, unspecified: D64.9

## 2020-05-01 LAB — CBC WITH DIFFERENTIAL/PLATELET
Abs Immature Granulocytes: 0.02 10*3/uL (ref 0.00–0.07)
Basophils Absolute: 0.1 10*3/uL (ref 0.0–0.1)
Basophils Relative: 1 %
Eosinophils Absolute: 0.1 10*3/uL (ref 0.0–0.5)
Eosinophils Relative: 2 %
HCT: 43.7 % (ref 39.0–52.0)
Hemoglobin: 14.2 g/dL (ref 13.0–17.0)
Immature Granulocytes: 0 %
Lymphocytes Relative: 26 %
Lymphs Abs: 1.6 10*3/uL (ref 0.7–4.0)
MCH: 32.6 pg (ref 26.0–34.0)
MCHC: 32.5 g/dL (ref 30.0–36.0)
MCV: 100.2 fL — ABNORMAL HIGH (ref 80.0–100.0)
Monocytes Absolute: 0.5 10*3/uL (ref 0.1–1.0)
Monocytes Relative: 9 %
Neutro Abs: 3.7 10*3/uL (ref 1.7–7.7)
Neutrophils Relative %: 62 %
Platelets: 360 10*3/uL (ref 150–400)
RBC: 4.36 MIL/uL (ref 4.22–5.81)
RDW: 13.5 % (ref 11.5–15.5)
WBC: 6 10*3/uL (ref 4.0–10.5)
nRBC: 0 % (ref 0.0–0.2)

## 2020-05-01 LAB — URINALYSIS, ROUTINE W REFLEX MICROSCOPIC
Bilirubin Urine: NEGATIVE
Glucose, UA: NEGATIVE mg/dL
Hgb urine dipstick: NEGATIVE
Ketones, ur: 5 mg/dL — AB
Leukocytes,Ua: NEGATIVE
Nitrite: NEGATIVE
Protein, ur: NEGATIVE mg/dL
Specific Gravity, Urine: 1.023 (ref 1.005–1.030)
pH: 5 (ref 5.0–8.0)

## 2020-05-01 LAB — SURGICAL PCR SCREEN
MRSA, PCR: NEGATIVE
Staphylococcus aureus: NEGATIVE

## 2020-05-01 LAB — COMPREHENSIVE METABOLIC PANEL
ALT: 19 U/L (ref 0–44)
AST: 20 U/L (ref 15–41)
Albumin: 4.3 g/dL (ref 3.5–5.0)
Alkaline Phosphatase: 84 U/L (ref 38–126)
Anion gap: 8 (ref 5–15)
BUN: 13 mg/dL (ref 8–23)
CO2: 23 mmol/L (ref 22–32)
Calcium: 8.9 mg/dL (ref 8.9–10.3)
Chloride: 103 mmol/L (ref 98–111)
Creatinine, Ser: 1.34 mg/dL — ABNORMAL HIGH (ref 0.61–1.24)
GFR calc Af Amer: >60 mL/min (ref >60)
GFR calc non Af Amer: 52 mL/min — ABNORMAL LOW (ref >60)
Glucose, Bld: 110 mg/dL — ABNORMAL HIGH (ref 70–99)
Potassium: 4 mmol/L (ref 3.5–5.1)
Sodium: 134 mmol/L — ABNORMAL LOW (ref 135–145)
Total Bilirubin: 1.4 mg/dL — ABNORMAL HIGH (ref 0.3–1.2)
Total Protein: 7.5 g/dL (ref 6.5–8.1)

## 2020-05-01 LAB — PROTIME-INR
INR: 1 (ref 0.8–1.2)
Prothrombin Time: 12.5 seconds (ref 11.4–15.2)

## 2020-05-01 LAB — APTT: aPTT: 41 seconds — ABNORMAL HIGH (ref 24–36)

## 2020-05-01 LAB — SARS CORONAVIRUS 2 (TAT 6-24 HRS): SARS Coronavirus 2: NEGATIVE

## 2020-05-01 NOTE — Patient Instructions (Signed)
DUE TO COVID-19 ONLY ONE VISITOR IS ALLOWED TO COME WITH YOU AND STAY IN THE WAITING ROOM ONLY DURING PRE OP AND PROCEDURE.   IF YOU WILL BE ADMITTED INTO THE HOSPITAL YOU ARE ALLOWED ONE SUPPORT PERSON DURING VISITATION HOURS ONLY (10AM -8PM)    The support person may change daily.  The support person must pass our screening, gel in and out, and wear a mask at all times, including in the patient's room.  Patients must also wear a mask when staff or their support person are in the room.              COVID SWAB TESTING MUST BE COMPLETED ON:  Today, Aug. 12, 2021 at 1155AM                         [address] [place], [place] [address]  (Must self quarantine after testing. Follow instructions on handout.)                  Your procedure is scheduled on: Monday, Aug. 16, 2021              Report to [facility] Main  Entrance              Report to admitting at 9:25 AM              Call this number if you have problems the morning of surgery [phone]              Do not eat food :After Midnight.              May have liquids until 9:30 AM   day of surgery  CLEAR LIQUID DIET  Foods Allowed                                                                     Foods Excluded  Water, Black Coffee and tea, regular and decaf                             liquids that you cannot  Plain Jell-O in any flavor  (No red)                                           see through such as: Fruit ices (not with fruit pulp)                                     milk, soups, orange juice              Iced Popsicles (No red)                                           All solid food  Apple juices Sports drinks like Gatorade (No red) Lightly seasoned clear broth or consume(fat free) Sugar, honey syrup  Sample Menu Breakfast                                Lunch                                     Supper Cranberry juice                    Beef broth                             Chicken broth Jell-O                                     Grape juice                           Apple juice Coffee or tea                        Jell-O                                      Popsicle                                                Coffee or tea                        Coffee or tea                            Complete one Ensure drink the morning of surgery at  8:55 AM     the day of surgery.  Oral Hygieneis also important to reduce your risk of infection.                                   Remember - BRUSH YOUR TEETH THE MORNING OF SURGERY WITH YOUR REGULAR TOOTHPASTE              Do NOT smoke after Midnight              Take these medicines the morning of surgery with A SIP OF WATER: Amlodipine, Tamsulosin                               You may not have any metal on your body including jewelry, and body piercings             Do not wear lotions, powders, perfumes/cologne, or deodorant                           Men may shave face and neck.  Do not bring valuables to the hospital. [place].              Contacts, dentures or bridgework may not be worn into surgery.              Bring small overnight bag day of surgery.                          Special Instructions: Bring a copy of your healthcare power of attorney and living will documents         the day of surgery if you haven't scanned them in before.              Please read over the following fact sheets you were given: IF YOU HAVE QUESTIONS ABOUT YOUR PRE OP INSTRUCTIONS PLEASE CALL [phone]   [place] - Preparing for Surgery Before surgery, you can play an important role.  Because skin is not sterile, your skin needs to be as free of germs as possible.  You can reduce the number of germs on your skin by washing with CHG (chlorahexidine gluconate) soap before surgery.  CHG is an antiseptic cleaner which kills germs  and bonds with the skin to continue killing germs even after washing. Please DO NOT use if you have an allergy to CHG or antibacterial soaps.  If your skin becomes reddened/irritated stop using the CHG and inform your nurse when you arrive at Short Stay. Do not shave (including legs and underarms) for at least 48 hours prior to the first CHG shower.  You may shave your face/neck.  Please follow these instructions carefully:             1.  Shower with CHG Soap the night before surgery and the  morning of surgery.             2.  If you choose to wash your hair, wash your hair first as usual with your normal  shampoo.             3.  After you shampoo, rinse your hair and body thoroughly to remove the shampoo.                                                 4.  Use CHG as you would any other liquid soap.  You can apply chg directly to the skin and wash.  Gently with a scrungie or clean washcloth.             5.  Apply the CHG Soap to your body ONLY FROM THE NECK DOWN.   Do                not use on face/ open                           Wound or open sores. Avoid contact with eyes, ears mouth and                        genitals (private parts).  Wash face,  Genitals (private parts) with your normal soap.             6.  Wash thoroughly, paying special attention to the area where your                                     surgery  will be performed.             7.  Thoroughly rinse your body with warm water from the neck down.             8.  DO NOT shower/wash with your normal soap after using and rinsing off the CHG Soap.                9.  Pat yourself dry with a clean towel.            10.  Wear clean pajamas.            11.  Place clean sheets on your bed the night of your first shower and do not  sleep with pets. Day of Surgery : Do not apply any lotions/deodorants the morning of surgery.  Please wear clean clothes to the hospital/surgery center.  FAILURE TO FOLLOW THESE  INSTRUCTIONS MAY RESULT IN THE CANCELLATION OF YOUR SURGERY  PATIENT SIGNATURE_________________________________  NURSE SIGNATURE__________________________________  ________________________________________________________________________   Larry Reese  An incentive spirometer is a tool that can help keep your lungs clear and active. This tool measures how well you are filling your lungs with each breath. Taking long deep breaths may help reverse or decrease the chance of developing breathing (pulmonary) problems (especially infection) following:  A long period of time when you are unable to move or be active. BEFORE THE PROCEDURE   If the spirometer includes an indicator to show your best effort, your nurse or respiratory therapist will set it to a desired goal.  If possible, sit up straight or lean slightly forward. Try not to slouch.  Hold the incentive spirometer in an upright position. INSTRUCTIONS FOR USE  1. Sit on the edge of your bed if possible, or sit up as far as you can in bed or on a chair. 2. Hold the incentive spirometer in an upright position. 3. Breathe out normally. 4. Place the mouthpiece in your mouth and seal your lips tightly around it. 5. Breathe in slowly and as deeply as possible, raising the piston or the ball toward the top of the column. 6. Hold your breath for 3-5 seconds or for as long as possible. Allow the piston or ball to fall to the bottom of the column. 7. Remove the mouthpiece from your mouth and breathe out normally. 8. Rest for a few seconds and repeat Steps 1 through 7 at least 10 times every 1-2 hours when you are awake. Take your time and take a few normal breaths between deep breaths. 9. The spirometer may include an indicator to show your best effort. Use the indicator as a goal to work toward during each repetition. 10. After each set of 10 deep breaths, practice coughing to be sure your lungs are clear. If you have an incision  (the cut made at the time of surgery), support your incision when coughing by placing a pillow or rolled up towels firmly against it. Once you are able to get out of bed, walk around indoors and cough well. You may  stop using the incentive spirometer when instructed by your caregiver.  RISKS AND COMPLICATIONS  Take your time so you do not get dizzy or light-headed.  If you are in pain, you may need to take or ask for pain medication before doing incentive spirometry. It is harder to take a deep breath if you are having pain. AFTER USE  Rest and breathe slowly and easily.  It can be helpful to keep track of a log of your progress. Your caregiver can provide you with a simple table to help with this. If you are using the spirometer at home, follow these instructions: [place] IF:   You are having difficultly using the spirometer.  You have trouble using the spirometer as often as instructed.  Your pain medication is not giving enough relief while using the spirometer.  You develop fever of 100.5 F (38.1 C) or higher. SEEK IMMEDIATE MEDICAL CARE IF:   You cough up bloody sputum that had not been present before.  You develop fever of 102 F (38.9 C) or greater.  You develop worsening pain at or near the incision site. MAKE SURE YOU:   Understand these instructions.  Will watch your condition.  Will get help right away if you are not doing well or get worse. Document Released: 01/17/2007 Document Revised: 11/29/2011 Document Reviewed: 03/20/2007 ExitCare Patient Information 2014 ExitCare, [place].   ________________________________________________________________________  WHAT IS A BLOOD TRANSFUSION? Blood Transfusion Information  A transfusion is the replacement of blood or some of its parts. Blood is made up of multiple cells which provide different functions.  Red blood cells carry oxygen and are used for blood loss replacement.  White blood cells fight  against infection.  Platelets control bleeding.  Plasma helps clot blood.  Other blood products are available for specialized needs, such as hemophilia or other clotting disorders. BEFORE THE TRANSFUSION  Who gives blood for transfusions?   Healthy volunteers who are fully evaluated to make sure their blood is safe. This is blood bank blood. Transfusion therapy is the safest it has ever been in the practice of medicine. Before blood is taken from a donor, a complete history is taken to make sure that person has no history of diseases nor engages in risky social behavior (examples are intravenous drug use or sexual activity with multiple partners). The donor's travel history is screened to minimize risk of transmitting infections, such as malaria. The donated blood is tested for signs of infectious diseases, such as HIV and hepatitis. The blood is then tested to be sure it is compatible with you in order to minimize the chance of a transfusion reaction. If you or a relative donates blood, this is often done in anticipation of surgery and is not appropriate for emergency situations. It takes many days to process the donated blood. RISKS AND COMPLICATIONS Although transfusion therapy is very safe and saves many lives, the main dangers of transfusion include:   Getting an infectious disease.  Developing a transfusion reaction. This is an allergic reaction to something in the blood you were given. Every precaution is taken to prevent this. The decision to have a blood transfusion has been considered carefully by your caregiver before blood is given. Blood is not given unless the benefits outweigh the risks. AFTER THE TRANSFUSION  Right after receiving a blood transfusion, you will usually feel much better and more energetic. This is especially true if your red blood cells have gotten low (anemic). The transfusion raises the level  of the red blood cells which carry oxygen, and this usually causes an  energy increase.  The nurse administering the transfusion will monitor you carefully for complications. HOME CARE INSTRUCTIONS  No special instructions are needed after a transfusion. You may find your energy is better. Speak with your caregiver about any limitations on activity for underlying diseases you may have. SEEK MEDICAL CARE IF:   Your condition is not improving after your transfusion.  You develop redness or irritation at the intravenous (IV) site. SEEK IMMEDIATE MEDICAL CARE IF:  Any of the following symptoms occur over the next 12 hours:  Shaking chills.  You have a temperature by mouth above 102 F (38.9 C), not controlled by medicine.  Chest, back, or muscle pain.  People around you feel you are not acting correctly or are confused.  Shortness of breath or difficulty breathing.  Dizziness and fainting.  You get a rash or develop hives.  You have a decrease in urine output.  Your urine turns a dark color or changes to pink, red, or brown. Any of the following symptoms occur over the next 10 days:  You have a temperature by mouth above 102 F (38.9 C), not controlled by medicine.  Shortness of breath.  Weakness after normal activity.  The white part of the eye turns yellow (jaundice).  You have a decrease in the amount of urine or are urinating less often.  Your urine turns a dark color or changes to pink, red, or brown. Document Released: 09/03/2000 Document Revised: 11/29/2011 Document Reviewed: 04/22/2008 [facility] Patient Information 2014 [place].

## 2020-05-02 NOTE — Progress Notes (Signed)
Lab results: PTT: 41

## 2020-05-02 NOTE — Progress Notes (Signed)
Anesthesia Chart Review   Case: [id number] Date/Time: 05/05/20 1140   Procedure: RIGHT TOTAL HIP ARTHROPLASTY ANTERIOR APPROACH (Right Hip)   Anesthesia type: Spinal   Pre-op diagnosis: right hip degenerative joint disease   Location: [facility] ROOM 08 / [facility]   Surgeons: Leandrew Koyanagi, MD      DISCUSSION:74 y.o. current every day smoker (12.5 pack years) with h/o HTN, Stroke 2005 with left sided weakness, right hip djd scheduled for above procedure 05/05/2020 with Dr. Frankey Shown.   New onset a-fib on EKG at PAT visit 05/01/2020.  Pt will need evaluation by cardiology before proceeding with hip surgery.  Appointment made for 05/07/2020 with Dr. Harrell Gave.  Dr. Phoebe Sharps office informed.   VS: BP 128/69   Pulse 84   Temp 36.8 C (Oral)   Resp 18   Ht 6\' 6"  (1.981 m)   Wt 90.7 kg   SpO2 100%   BMI 23.11 kg/m   PROVIDERS: [name], MD is PCP    LABS: Labs reviewed: Acceptable for surgery. (all labs ordered are listed, but only abnormal results are displayed)  Labs Reviewed  APTT - Abnormal; Notable for the following components:      Result Value   aPTT 41 (*)    All other components within normal limits  CBC WITH DIFFERENTIAL/PLATELET - Abnormal; Notable for the following components:   MCV 100.2 (*)    All other components within normal limits  COMPREHENSIVE METABOLIC PANEL - Abnormal; Notable for the following components:   Sodium 134 (*)    Glucose, Bld 110 (*)    Creatinine, Ser 1.34 (*)    Total Bilirubin 1.4 (*)    GFR calc non Af Amer 52 (*)    All other components within normal limits  URINALYSIS, ROUTINE W REFLEX MICROSCOPIC - Abnormal; Notable for the following components:   Ketones, ur 5 (*)    All other components within normal limits  SURGICAL PCR SCREEN  PROTIME-INR  TYPE AND SCREEN     IMAGES:   EKG: 05/01/2020 Rate 67 bpm  Atrial fibrillation   CV:  Past Medical History:  Diagnosis Date  . Anemia   . Arthritis   . Dupuytren's contracture of  right hand   . Hx of adenomatous colonic polyps   . Hypertension   . Pneumothorax   . Stroke [facility]) 2005   left sided weakness  . Trigger finger     Past Surgical History:  Procedure Laterality Date  . COLONOSCOPY  May 2010   Dr. fields: 5 cm terminal ileum normal, 6 polyps removed, moderate internal hemorrhoids, simple adenomas, next colonoscopy May 2015  . COLONOSCOPY N/A 08/01/2015   Procedure: COLONOSCOPY;  Surgeon: Danie Binder, MD;  Location: [facility] SUITE;  Service: Endoscopy;  Laterality: N/A;  0830  . CRANIOTOMY  2000   pressure  . INGUINAL HERNIA REPAIR Right   . OLECRANON BURSECTOMY Right 02/11/2015   Procedure: EXCISION RIGHT OLECRANON BURSA;  Surgeon: Sanjuana Kava, MD;  Location: [facility] ORS;  Service: Orthopedics;  Laterality: Right;  . repair of trigger finger Left    pinky    MEDICATIONS: . ALPRAZolam (XANAX) 0.5 MG tablet  . amLODipine (NORVASC) 10 MG tablet  . aspirin EC 81 MG tablet  . diclofenac sodium (VOLTAREN) 1 % GEL  . HYDROcodone-acetaminophen (NORCO) 7.5-325 MG tablet  . tamsulosin (FLOMAX) 0.4 MG CAPS capsule  . vitamin B-12 (CYANOCOBALAMIN) 1000 MCG tablet  . vitamin E (VITAMIN E) 180 MG (400 UNITS) capsule   .  lidocaine (XYLOCAINE) 1 % (with pres) injection 8 mL  . methylPREDNISolone acetate (DEPO-MEDROL) injection 40 mg   [facility] Pre-Surgical Testing [phone]

## 2020-05-05 ENCOUNTER — Ambulatory Visit (HOSPITAL_COMMUNITY): Admission: RE | Admit: 2020-05-05 | Payer: PPO | Source: Home / Self Care | Admitting: Orthopaedic Surgery

## 2020-05-05 ENCOUNTER — Encounter (HOSPITAL_COMMUNITY): Admission: RE | Payer: Self-pay | Source: Home / Self Care

## 2020-05-05 LAB — TYPE AND SCREEN
ABO/RH(D): O POS
Antibody Screen: NEGATIVE

## 2020-05-05 SURGERY — ARTHROPLASTY, HIP, TOTAL, ANTERIOR APPROACH
Anesthesia: Spinal | Site: Hip | Laterality: Right

## 2020-05-07 ENCOUNTER — Ambulatory Visit (INDEPENDENT_AMBULATORY_CARE_PROVIDER_SITE_OTHER): Payer: PPO | Admitting: Cardiology

## 2020-05-07 ENCOUNTER — Telehealth: Payer: Self-pay

## 2020-05-07 ENCOUNTER — Encounter: Payer: Self-pay | Admitting: Cardiology

## 2020-05-07 ENCOUNTER — Other Ambulatory Visit: Payer: Self-pay

## 2020-05-07 VITALS — BP 110/86 | HR 57 | Ht 78.0 in | Wt 202.6 lb

## 2020-05-07 DIAGNOSIS — Z8673 Personal history of transient ischemic attack (TIA), and cerebral infarction without residual deficits: Secondary | ICD-10-CM | POA: Diagnosis not present

## 2020-05-07 DIAGNOSIS — E7800 Pure hypercholesterolemia, unspecified: Secondary | ICD-10-CM | POA: Diagnosis not present

## 2020-05-07 DIAGNOSIS — Z7189 Other specified counseling: Secondary | ICD-10-CM | POA: Diagnosis not present

## 2020-05-07 DIAGNOSIS — I4891 Unspecified atrial fibrillation: Secondary | ICD-10-CM

## 2020-05-07 DIAGNOSIS — Z01810 Encounter for preprocedural cardiovascular examination: Secondary | ICD-10-CM | POA: Diagnosis not present

## 2020-05-07 DIAGNOSIS — I739 Peripheral vascular disease, unspecified: Secondary | ICD-10-CM

## 2020-05-07 DIAGNOSIS — I10 Essential (primary) hypertension: Secondary | ICD-10-CM | POA: Diagnosis not present

## 2020-05-07 NOTE — Telephone Encounter (Signed)
Patient wife called in to see if patient is still having surgery or not.

## 2020-05-07 NOTE — Telephone Encounter (Signed)
Wife states the Cardiologist cleared him today. They faxed form.

## 2020-05-07 NOTE — Progress Notes (Signed)
Cardiology Office Note:    Date:  05/07/2020   ID:  [name], DOB 1945-11-23, MRN [id number]  PCP:  [name], MD  Cardiologist:  Buford Dresser, MD  Referring MD: [name], MD   CC: new patient consultation for preoperative evaluation  History of Present Illness:    Larry Reese is a 74 y.o. male with a hx of tobacco use, hypertension, CVA 2005 who is seen as a new consult at the request of [name], MD for the evaluation and management of atrial fibrillation, preoperative evaluation.  Notes in chart reviewed. At preop visit 05/01/20, noted to be in new atrial fibrillation on ECG. Referred to cardiology for evaluation prior to hip surgery.  Planned surgery: right total hip arthoplasty, Dr. Erlinda Hong, date TBD.  Pertinent past cardiac history: Prior cardiac workup: none History of valve disease: none History of CAD/PAD/CVA/TIA: CVA 2005, no other that he knows of History of heart failure: none History of arrhythmia: new afib, wasn't told what it was On anticoagulation: not yet Additional history: Hypertension. Denies diabetes, CKD, OSA, anesthesia complications Current symptoms: hip pain Functional capacity: can climb stairs, though not quickly, due to hip pain. Can do yardwork, etc.   Denies chest pain, shortness of breath at rest or with normal exertion. No PND, orthopnea, LE edema or unexpected weight gain. No syncope or palpitations.  Past Medical History:  Diagnosis Date  . Anemia   . Arthritis   . Dupuytren's contracture of right hand   . Hx of adenomatous colonic polyps   . Hypertension   . Pneumothorax   . Stroke [facility]) 2005   left sided weakness  . Trigger finger     Past Surgical History:  Procedure Laterality Date  . COLONOSCOPY  May 2010   Dr. fields: 5 cm terminal ileum normal, 6 polyps removed, moderate internal hemorrhoids, simple adenomas, next colonoscopy May 2015  . COLONOSCOPY N/A 08/01/2015   Procedure: COLONOSCOPY;   Surgeon: Danie Binder, MD;  Location: [facility] END[facility] SU[facility];  Service: Endoscopy;  Laterality: N/A;  0830  . CRANIOTOMY  2000   pressure  . INGUINAL HERNIA REPAIR Right   . OLECRANON BURSECTOMY Right 02/11/2015   Procedure: EXCISION RIGHT OLECRANON BURSA;  Surgeon: Sanjuana Kava, MD;  Location: [facility] ORS;  Service: Orthopedics;  Laterality: Right;  . repair of trigger finger Left    pinky    Current Medications: Current Outpatient Medications on File Prior to Visit  Medication Sig  . ALPRAZolam (XANAX) 0.5 MG tablet Take 0.5 mg by mouth at bedtime as needed for sleep. Reported on 02/19/2016  . amLODipine (NORVASC) 10 MG tablet Take 10 mg by mouth daily.  Marland Kitchen aspirin EC 81 MG tablet Take 81 mg by mouth daily.  . diclofenac sodium (VOLTAREN) 1 % GEL Apply 2 g topically 4 (four) times daily. (Patient taking differently: Apply 2 g topically at bedtime as needed (pain). )  . HYDROcodone-acetaminophen (NORCO) 7.5-325 MG tablet Take 1 tablet by mouth every 6 (six) hours as needed for moderate pain. Must last 30 days  . tamsulosin (FLOMAX) 0.4 MG CAPS capsule Take 0.4 mg by mouth daily.   . vitamin B-12 (CYANOCOBALAMIN) 1000 MCG tablet Take 1,000 mcg by mouth daily.  . vitamin E (VITAMIN E) 180 MG (400 UNITS) capsule Take 400 Units by mouth daily.   Current Facility-Administered Medications on File Prior to Visit  Medication  . lidocaine (XYLOCAINE) 1 % (with pres) injection 8 mL  . methylPREDNISolone acetate (DEPO-MEDROL) injection  40 mg     Allergies:   Aleve [naproxen], Celecoxib, and Fluoxetine hcl   Social History   Tobacco Use  . Smoking status: Current Every Day Smoker    Packs/day: 0.25    Years: 50.00    Pack years: 12.50    Types: Cigarettes  . Smokeless tobacco: Never Used  . Tobacco comment: 5 cigarettes per day  Vaping Use  . Vaping Use: Never used  Substance Use Topics  . Alcohol use: Yes    Alcohol/week: 5.0 standard drinks    Types: 5 Cans of beer per week    Comment: 3-4  beers/day  . Drug use: No    Family History: family history includes Esophageal cancer in his father. There is no history of Colon cancer.  ROS:   Please see the history of present illness.  Additional pertinent ROS: Constitutional: Negative for chills, fever, night sweats, unintentional weight loss  HENT: Negative for ear pain and hearing loss.   Eyes: Negative for loss of vision and eye pain.  Respiratory: Negative for cough, sputum, wheezing.   Cardiovascular: See HPI. Gastrointestinal: Negative for abdominal pain, melena, and hematochezia.  Genitourinary: Negative for dysuria and hematuria.  Musculoskeletal: Negative for falls and myalgias.  Skin: Negative for itching and rash.  Neurological: Negative for focal weakness, focal sensory changes and loss of consciousness.  Endo/Heme/Allergies: Does not bruise/bleed easily.     EKGs/Labs/Other Studies Reviewed:    The following studies were reviewed today: No prior cardiac studies  EKG:  EKG is personally reviewed.  The ekg ordered today demonstrates atrial fibrillation with slow ventricular response of 57 bpm.  Recent Labs: 05/01/2020: ALT 19; BUN 13; Creatinine, Ser 1.34; Hemoglobin 14.2; Platelets 360; Potassium 4.0; Sodium 134  Recent Lipid Panel No results found for: CHOL, TRIG, HDL, CHOLHDL, VLDL, LDLCALC, LDLDIRECT  Physical Exam:    VS:  BP 110/86   Pulse (!) 57   Ht 6\' 6"  (1.981 m)   Wt 202 lb 9.6 oz (91.9 kg)   SpO2 99%   BMI 23.41 kg/m     Wt Readings from Last 3 Encounters:  05/07/20 202 lb 9.6 oz (91.9 kg)  05/01/20 200 lb (90.7 kg)  04/16/20 200 lb 9.6 oz (91 kg)    GEN: Well nourished, well developed in no acute distress HEENT: Normal, moist mucous membranes NECK: No JVD CARDIAC: irregularly irregular rhythm, normal S1 and S2, no rubs or gallops. No murmurs. VASCULAR: Radial and DP pulses 2+ bilaterally. No carotid bruits RESPIRATORY:  Clear to auscultation without rales, wheezing or rhonchi    ABDOMEN: Soft, non-tender, non-distended MUSCULOSKELETAL:  Ambulates independently SKIN: Warm and dry, no edema NEUROLOGIC:  Alert and oriented x 3. No focal neuro deficits noted. PSYCHIATRIC:  Normal affect    ASSESSMENT:    1. Atrial fibrillation, unspecified type ([place])   2. Preop cardiovascular exam   3. Essential hypertension   4. PAD (peripheral artery disease) (HCC)   5. Cardiac risk counseling   6. Counseling on health promotion and disease prevention   7. History of hemorrhagic cerebrovascular accident (CVA) without residual deficits   8. Pure hypercholesterolemia    PLAN:    New atrial fibrillation CHA2DS2/VAS Stroke Risk Points =5 (age, hypertension, CVAx2, PAD) -had hemorrhagic stroke in 2005, seen at [facility], had neurosurgery for "drill a hole to drain the fluid." Doesn't recall being told that he had an aneurysm, etc.  -will need to determine if he can be anticoagulated long term. I reviewed available  notes in Care Everywhere, but these do not give significant information on the stroke. We will request further records.  Preoperative cardiovascular examination: Based on available date, patient's RCRI score = 0, which carries a 3.9% 30-day risk of death, MI, or cardiac arrest.  The patient is not currently having active cardiac symptoms, and they can achieve >4 METs of activity.  According to ACC/AHA Guidelines, no further testing is needed.  Proceed with surgery at acceptable risk.  Our service is available as needed in the peri-operative period.    As he is not yet on anticoagulation, this does not need to be addressed if surgery will be done in the near future  Hypertension: -continue amlodipine 10 mg daily  PAD: -continue aspirin for now. If anticoagulation started, would drop aspirin -should be on statin. Will address at follow up  Tobacco abuse counseling: The patient was counseled on tobacco cessation today for 4 minutes.  Counseling included reviewing the  risks of smoking tobacco products, how it impacts the patient's current medical diagnoses and different strategies for quitting.  Pharmacotherapy to aid in tobacco cessation was not prescribed today.  Hypercholesterolemia: -with PAD, goal LDL <70 -per [facility], lipids 12/14/19 show Tchol 221, HDL 40, LDL 155, TG 139 -need to discuss statin on follow up   Cardiac risk counseling and prevention recommendations: -recommend heart healthy/Mediterranean diet, with whole grains, fruits, vegetable, fish, lean meats, nuts, and olive oil. Limit salt. -recommend moderate walking, 3-5 times/week for 30-50 minutes each session. Aim for at least 150 minutes.week. Goal should be pace of 3 miles/hours, or walking 1.5 miles in 30 minutes -recommend avoidance of tobacco products. Avoid excess alcohol.  Plan for follow up: 4-6 weeks  Buford Dresser, MD, PhD [place]  [facility]    Medication Adjustments/Labs and Tests Ordered: Current medicines are reviewed at length with the patient today.  Concerns regarding medicines are outlined above.  Orders Placed This Encounter  Procedures  . EKG 12-Lead   No orders of the defined types were placed in this encounter.   Patient Instructions  Medication Instructions:  Your Physician recommend you continue on your current medication as directed.    *If you need a refill on your cardiac medications before your next appointment, please call your pharmacy*   Lab Work: None   Testing/Procedures: None   Follow-Up: At [facility], you and your health needs are our priority.  As part of our continuing mission to provide you with exceptional heart care, we have created designated Provider Care Teams.  These Care Teams include your primary Cardiologist (physician) and Advanced Practice Providers (APPs -  Physician Assistants and Nurse Practitioners) who all work together to provide you with the care you need, when you need it.  We recommend signing  up for the patient portal called "MyChart".  Sign up information is provided on this After Visit Summary.  MyChart is used to connect with patients for Virtual Visits (Telemedicine).  Patients are able to view lab/test results, encounter notes, upcoming appointments, etc.  Non-urgent messages can be sent to your provider as well.   To learn more about what you can do with MyChart, go to [email or web address].    Your next appointment:   4-6 week(s)  The format for your next appointment:   In Person  Provider:   Buford Dresser, MD       Signed, Buford Dresser, MD PhD 05/07/2020   [place]

## 2020-05-07 NOTE — Patient Instructions (Signed)
Medication Instructions:  Your Physician recommend you continue on your current medication as directed.    *If you need a refill on your cardiac medications before your next appointment, please call your pharmacy*   Lab Work: None   Testing/Procedures: None   Follow-Up: At [facility], you and your health needs are our priority.  As part of our continuing mission to provide you with exceptional heart care, we have created designated Provider Care Teams.  These Care Teams include your primary Cardiologist (physician) and Advanced Practice Providers (APPs -  Physician Assistants and Nurse Practitioners) who all work together to provide you with the care you need, when you need it.  We recommend signing up for the patient portal called "MyChart".  Sign up information is provided on this After Visit Summary.  MyChart is used to connect with patients for Virtual Visits (Telemedicine).  Patients are able to view lab/test results, encounter notes, upcoming appointments, etc.  Non-urgent messages can be sent to your provider as well.   To learn more about what you can do with MyChart, go to [email or web address].    Your next appointment:   4-6 week(s)  The format for your next appointment:   In Person  Provider:   Bridgette Christopher, MD    

## 2020-05-08 ENCOUNTER — Other Ambulatory Visit: Payer: Self-pay | Admitting: Orthopaedic Surgery

## 2020-05-08 MED ORDER — HYDROCODONE-ACETAMINOPHEN 7.5-325 MG PO TABS: 1.0000 | ORAL_TABLET | Freq: Four times a day (QID) | ORAL | 0 refills | Status: DC | PRN

## 2020-05-08 NOTE — Telephone Encounter (Signed)
Ok thanks 

## 2020-05-08 NOTE — Telephone Encounter (Signed)
Patient (Larry Reese)_called to request refill of pain medication; states that his surgery scheduled with Larry Reese has been cancelled, pending clearance. He is aware Larry Reese is now out of clinic until 05/20/20 - routing to clinical staff for request to be sent to Larry Reese:   HYDROcodone-acetaminophen ([name]) 7.5-325 MG tablet 60 tablet  [place]

## 2020-05-08 NOTE — Telephone Encounter (Signed)
Patient relays that his surgery has been postponed; awaiting clearance.

## 2020-05-08 NOTE — Telephone Encounter (Deleted)
Patient reports his Hydrocodone is to Take 1 tablet by mouth every 6 (six) hours as needed for moderate pain and he must make this last 30 days per Dr. Raliegh Ip last note.   The patient uses [facility]. Please advise.

## 2020-05-14 ENCOUNTER — Telehealth: Payer: Self-pay | Admitting: *Deleted

## 2020-05-14 NOTE — Telephone Encounter (Signed)
Spoke to wife in office today.  Clearance not received yet and nothing noted in cardiology notes that patient is cleared.  I faxed clearance request to Dr. Harrell Gave to obtain clearance.  Per discussion with wife, patient is unable to be a same day discharge so rescheduling will have to wait until overnight stays are allowed at [facility] again.  COVID restrictions are preventing that presently.  Target date will be in October pending hospital opening back up for overnight beds.Marland Kitchen

## 2020-05-14 NOTE — Telephone Encounter (Signed)
[name] you saw this patient 8/18- can you comment on pre op clearance-  thanks  Kerin Ransom PA-C 05/14/2020 3:54 PM  CV DIV PRE OP

## 2020-05-14 NOTE — Telephone Encounter (Signed)
° °  [name]    HEARTCARE STAFF: - Please ensure there is not already an duplicate clearance open for this procedure. - Under Visit Info/Reason for Call, type in Other and utilize the format Clearance MM/DD/YY or Clearance TBD. Do not use dashes or single digits. - If request is for dental extraction, please clarify the # of teeth to be extracted.  Request for surgical clearance:  1. What type of surgery is being performed? RIGHT TOTAL HIP ARTHROPLASTY   2. When is this surgery scheduled? TBD   3. What type of clearance is required (medical clearance vs. Pharmacy clearance to hold med vs. Both)? MEDICAL  4. Are there any medications that need to be held prior to surgery and how long? ASA   5. Practice name and name of physician performing surgery? [facility] [place]; [place] Larry Reese   6. What is the office phone number? [phone]   7.   What is the office fax number? [place].   Anesthesia type (None, local, MAC, general) ? SPINAL   Larry Reese 05/14/2020, 3:47 PM  _________________________________________________________________   (provider comments below)

## 2020-05-20 NOTE — Telephone Encounter (Addendum)
   Primary Cardiologist: Buford Dresser, MD  Chart revisited as part of pre-operative protocol coverage. Patient recently saw Dr. Harrell Gave 05/07/20, office note not yet available. Awaiting MD reply as below. Dr. Harrell Gave - Please route response to P CV DIV PREOP (the pre-op pool). Thank you!  Charlie Pitter, PA-C 05/20/2020, 8:19 AM

## 2020-05-27 ENCOUNTER — Encounter: Payer: Self-pay | Admitting: Cardiology

## 2020-05-27 DIAGNOSIS — Z8673 Personal history of transient ischemic attack (TIA), and cerebral infarction without residual deficits: Secondary | ICD-10-CM | POA: Insufficient documentation

## 2020-05-27 DIAGNOSIS — I739 Peripheral vascular disease, unspecified: Secondary | ICD-10-CM | POA: Insufficient documentation

## 2020-05-27 DIAGNOSIS — I4891 Unspecified atrial fibrillation: Secondary | ICD-10-CM | POA: Insufficient documentation

## 2020-05-27 DIAGNOSIS — I10 Essential (primary) hypertension: Secondary | ICD-10-CM | POA: Insufficient documentation

## 2020-05-27 NOTE — Telephone Encounter (Signed)
Chart complete, acceptable risk for surgery

## 2020-05-28 NOTE — Telephone Encounter (Signed)
   Primary Cardiologist: Buford Dresser, MD  Chart reviewed as part of pre-operative protocol coverage. Given past medical history and time since last visit, based on ACC/AHA guidelines, Larry Reese would be at acceptable risk for the planned procedure without further cardiovascular testing.   Ok to hold aspirin 5 days pre op if needed.  The patient was advised that if he develops new symptoms prior to surgery to contact our office to arrange for a follow-up visit, and he verbalized understanding.  I will route this recommendation to the requesting party via Epic fax function and remove from pre-op pool.  Please call with questions.  Kerin Ransom, PA-C 05/28/2020, 8:17 AM

## 2020-05-31 DIAGNOSIS — M1990 Unspecified osteoarthritis, unspecified site: Secondary | ICD-10-CM | POA: Diagnosis not present

## 2020-05-31 DIAGNOSIS — I10 Essential (primary) hypertension: Secondary | ICD-10-CM | POA: Diagnosis not present

## 2020-06-10 ENCOUNTER — Other Ambulatory Visit: Payer: Self-pay

## 2020-06-10 ENCOUNTER — Encounter: Payer: Self-pay | Admitting: Orthopaedic Surgery

## 2020-06-10 ENCOUNTER — Ambulatory Visit: Payer: PPO | Admitting: Orthopaedic Surgery

## 2020-06-10 VITALS — BP 125/80 | HR 75 | Ht 78.0 in | Wt 207.0 lb

## 2020-06-10 DIAGNOSIS — M25512 Pain in left shoulder: Secondary | ICD-10-CM

## 2020-06-10 DIAGNOSIS — F17210 Nicotine dependence, cigarettes, uncomplicated: Secondary | ICD-10-CM

## 2020-06-10 DIAGNOSIS — G8929 Other chronic pain: Secondary | ICD-10-CM

## 2020-06-10 MED ORDER — HYDROCODONE-ACETAMINOPHEN 7.5-325 MG PO TABS: 1.0000 | ORAL_TABLET | Freq: Four times a day (QID) | ORAL | 0 refills | Status: DC | PRN

## 2020-06-10 NOTE — Progress Notes (Signed)
[name], male DOB:[birth year], 74 y.o. QMG:[id number]  Chief Complaint  Patient presents with  . Shoulder Pain    left     HPI  Larry Reese is a 74 y.o. male who has chronic left shoulder pain.  He is stable. He has good and bad days.  He has no new trauma.  He is awaiting to get a total hip.   Body mass index is 23.92 kg/m.  ROS  Review of Systems  Constitutional: Positive for activity change.  HENT: Negative for congestion.   Respiratory: Positive for cough. Negative for shortness of breath.   Cardiovascular: Negative for chest pain.  Endocrine: Positive for cold intolerance.  Musculoskeletal: Positive for arthralgias, back pain, myalgias and neck pain.  Allergic/Immunologic: Negative for environmental allergies.  All other systems reviewed and are negative.   All other systems reviewed and are negative.  The following is a summary of the past history medically, past history surgically, known current medicines, social history and family history.  This information is gathered electronically by the computer from prior information and documentation.  I review this each visit and have found including this information at this point in the chart is beneficial and informative.    Past Medical History:  Diagnosis Date  . Anemia   . Arthritis   . Dupuytren's contracture of right hand   . Hx of adenomatous colonic polyps   . Hypertension   . Pneumothorax   . Stroke [facility]) 2005   left sided weakness  . Trigger finger     Past Surgical History:  Procedure Laterality Date  . COLONOSCOPY  May 2010   Dr. fields: 5 cm terminal ileum normal, 6 polyps removed, moderate internal hemorrhoids, simple adenomas, next colonoscopy May 2015  . COLONOSCOPY N/A 08/01/2015   Procedure: COLONOSCOPY;  Surgeon: Danie Binder, MD;  Location[facility];  Service: Endoscopy;  Laterality: N/A;  0830  . CRANIOTOMY  2000   pressure  . INGUINAL HERNIA REPAIR Right   .  OLECRANON BURSECTOMY Right 02/11/2015   Procedure: EXCISION RIGHT OLECRANON BURSA;  Surgeon: Sanjuana Kava, MD;  Location: [facility] ORS;  Service: Orthopedics;  Laterality: Right;  . repair of trigger finger Left    pinky    Family History  Problem Relation Age of Onset  . Esophageal cancer Father   . Colon cancer Neg Hx     Social History Social History   Tobacco Use  . Smoking status: Current Every Day Smoker    Packs/day: 0.25    Years: 50.00    Pack years: 12.50    Types: Cigarettes  . Smokeless tobacco: Never Used  . Tobacco comment: 5 cigarettes per day  Vaping Use  . Vaping Use: Never used  Substance Use Topics  . Alcohol use: Yes    Alcohol/week: 5.0 standard drinks    Types: 5 Cans of beer per week    Comment: 3-4 beers/day  . Drug use: No    Allergies  Allergen Reactions  . Aleve [Naproxen] Other (See Comments)    Numbness in extremities   . Celecoxib Other (See Comments)    Celebrex. Nose bleed.  . Fluoxetine Hcl Swelling    Current Outpatient Medications  Medication Sig Dispense Refill  . ALPRAZolam (XANAX) 0.5 MG tablet Take 0.5 mg by mouth at bedtime as needed for sleep. Reported on 02/19/2016    . amLODipine (NORVASC) 10 MG tablet Take 10 mg by mouth daily.    Marland Kitchen aspirin  EC 81 MG tablet Take 81 mg by mouth daily.    . diclofenac sodium (VOLTAREN) 1 % GEL Apply 2 g topically 4 (four) times daily. (Patient taking differently: Apply 2 g topically at bedtime as needed (pain). ) 100 g 0  . HYDROcodone-acetaminophen (NORCO) 7.5-325 MG tablet Take 1 tablet by mouth every 6 (six) hours as needed for moderate pain. Must last 30 days 60 tablet 0  . tamsulosin (FLOMAX) 0.4 MG CAPS capsule Take 0.4 mg by mouth daily.     . vitamin B-12 (CYANOCOBALAMIN) 1000 MCG tablet Take 1,000 mcg by mouth daily.    . vitamin E (VITAMIN E) 180 MG (400 UNITS) capsule Take 400 Units by mouth daily.     Current Facility-Administered Medications  Medication Dose Route Frequency Provider  Last Rate Last Admin  . lidocaine (XYLOCAINE) 1 % (with pres) injection 8 mL  8 mL Infiltration Once Hilts, Michael, MD      . methylPREDNISolone acetate (DEPO-MEDROL) injection 40 mg  40 mg Intra-articular Once Hilts, Michael, MD         Physical Exam  Blood pressure 125/80, pulse 75, height 6\' 6"  (1.981 m), weight 207 lb (93.9 kg).  Constitutional: overall normal hygiene, normal nutrition, well developed, normal grooming, normal body habitus. Assistive device:none  Musculoskeletal: gait and station Limp none, muscle tone and strength are normal, no tremors or atrophy is present.  .  Neurological: coordination overall normal.  Deep tendon reflex/nerve stretch intact.  Sensation normal.  Cranial nerves II-XII intact.   Skin:   Normal overall no scars, lesions, ulcers or rashes. No psoriasis.  Psychiatric: Alert and oriented x 3.  Recent memory intact, remote memory unclear.  Normal mood and affect. Well groomed.  Good eye contact.  Cardiovascular: overall no swelling, no varicosities, no edema bilaterally, normal temperatures of the legs and arms, no clubbing, cyanosis and good capillary refill.  Left shoulder with pain, forward 125, abduction 120, internal 25, external 25, adduction full, extension 5.  NV intact.   Lymphatic: palpation is normal.  All other systems reviewed and are negative   The patient has been educated about the nature of the problem(s) and counseled on treatment options.  The patient appeared to understand what I have discussed and is in agreement with it.  Encounter Diagnoses  Name Primary?  . Chronic left shoulder pain Yes  . Cigarette nicotine dependence without complication     PLAN Call if any problems.  Precautions discussed.  Continue current medications.   Return to clinic 3 months   I have reviewed the [place] web site prior to prescribing narcotic medicine for this patient.   Electronically  Signed Sanjuana Kava, MD 9/21/202110:14 AM

## 2020-06-12 ENCOUNTER — Other Ambulatory Visit: Payer: Self-pay

## 2020-06-12 ENCOUNTER — Ambulatory Visit (INDEPENDENT_AMBULATORY_CARE_PROVIDER_SITE_OTHER): Payer: PPO | Admitting: Cardiology

## 2020-06-12 ENCOUNTER — Encounter: Payer: Self-pay | Admitting: Cardiology

## 2020-06-12 VITALS — BP 138/82 | HR 77 | Ht 78.6 in | Wt 207.2 lb

## 2020-06-12 DIAGNOSIS — I480 Paroxysmal atrial fibrillation: Secondary | ICD-10-CM | POA: Diagnosis not present

## 2020-06-12 DIAGNOSIS — Z01810 Encounter for preprocedural cardiovascular examination: Secondary | ICD-10-CM

## 2020-06-12 DIAGNOSIS — I739 Peripheral vascular disease, unspecified: Secondary | ICD-10-CM

## 2020-06-12 DIAGNOSIS — I10 Essential (primary) hypertension: Secondary | ICD-10-CM

## 2020-06-12 DIAGNOSIS — Z8679 Personal history of other diseases of the circulatory system: Secondary | ICD-10-CM | POA: Diagnosis not present

## 2020-06-12 DIAGNOSIS — E7800 Pure hypercholesterolemia, unspecified: Secondary | ICD-10-CM

## 2020-06-12 DIAGNOSIS — Z7189 Other specified counseling: Secondary | ICD-10-CM | POA: Diagnosis not present

## 2020-06-12 NOTE — Patient Instructions (Addendum)
Medication Instructions:  Your Physician recommend you continue on your current medication as directed.    *If you need a refill on your cardiac medications before your next appointment, please call your pharmacy*   Lab Work: None ordered  Testing/Procedures: None ordered    Follow-Up: At [facility], you and your health needs are our priority.  As part of our continuing mission to provide you with exceptional heart care, we have created designated Provider Care Teams.  These Care Teams include your primary Cardiologist (physician) and Advanced Practice Providers (APPs -  Physician Assistants and Nurse Practitioners) who all work together to provide you with the care you need, when you need it.  We recommend signing up for the patient portal called "MyChart".  Sign up information is provided on this After Visit Summary.  MyChart is used to connect with patients for Virtual Visits (Telemedicine).  Patients are able to view lab/test results, encounter notes, upcoming appointments, etc.  Non-urgent messages can be sent to your provider as well.   To learn more about what you can do with MyChart, go to [email or web address].    Your next appointment:   6 month(s)  The format for your next appointment:   In Person  Provider:   Buford Dresser, MD    Apixaban oral tablets What is this medicine? APIXABAN (a PIX a ban) is an anticoagulant (blood thinner). It is used to lower the chance of stroke in people with a medical condition called atrial fibrillation. It is also used to treat or prevent blood clots in the lungs or in the veins. This medicine may be used for other purposes; ask your health care provider or pharmacist if you have questions. COMMON BRAND NAME(S): Eliquis What should I tell my health care provider before I take this medicine? They need to know if you have any of these conditions:  antiphospholipid antibody syndrome  bleeding disorders  bleeding in the  brain  blood in your stools (black or tarry stools) or if you have blood in your vomit  history of blood clots  history of stomach bleeding  kidney disease  liver disease  mechanical heart valve  an unusual or allergic reaction to apixaban, other medicines, foods, dyes, or preservatives  pregnant or trying to get pregnant  breast-feeding How should I use this medicine? Take this medicine by mouth with a glass of water. Follow the directions on the prescription label. You can take it with or without food. If it upsets your stomach, take it with food. Take your medicine at regular intervals. Do not take it more often than directed. Do not stop taking except on your doctor's advice. Stopping this medicine may increase your risk of a blood clot. Be sure to refill your prescription before you run out of medicine. Talk to your pediatrician regarding the use of this medicine in children. Special care may be needed. Overdosage: If you think you have taken too much of this medicine contact a poison control center or emergency room at once. NOTE: This medicine is only for you. Do not share this medicine with others. What if I miss a dose? If you miss a dose, take it as soon as you can. If it is almost time for your next dose, take only that dose. Do not take double or extra doses. What may interact with this medicine? This medicine may interact with the following:  aspirin and aspirin-like medicines  certain medicines for fungal infections like ketoconazole and itraconazole  certain  medicines for seizures like carbamazepine and phenytoin  certain medicines that treat or prevent blood clots like warfarin, enoxaparin, and dalteparin  clarithromycin  NSAIDs, medicines for pain and inflammation, like ibuprofen or naproxen  rifampin  ritonavir  St. John's wort This list may not describe all possible interactions. Give your health care provider a list of all the medicines, herbs,  non-prescription drugs, or dietary supplements you use. Also tell them if you smoke, drink alcohol, or use illegal drugs. Some items may interact with your medicine. What should I watch for while using this medicine? Visit your healthcare professional for regular checks on your progress. You may need blood work done while you are taking this medicine. Your condition will be monitored carefully while you are receiving this medicine. It is important not to miss any appointments. Avoid sports and activities that might cause injury while you are using this medicine. Severe falls or injuries can cause unseen bleeding. Be careful when using sharp tools or knives. Consider using an Copy. Take special care brushing or flossing your teeth. Report any injuries, bruising, or red spots on the skin to your healthcare professional. If you are going to need surgery or other procedure, tell your healthcare professional that you are taking this medicine. Wear a medical ID bracelet or chain. Carry a card that describes your disease and details of your medicine and dosage times. What side effects may I notice from receiving this medicine? Side effects that you should report to your doctor or health care professional as soon as possible:  allergic reactions like skin rash, itching or hives, swelling of the face, lips, or tongue  signs and symptoms of bleeding such as bloody or black, tarry stools; red or dark-brown urine; spitting up blood or brown material that looks like coffee grounds; red spots on the skin; unusual bruising or bleeding from the eye, gums, or nose  signs and symptoms of a blood clot such as chest pain; shortness of breath; pain, swelling, or warmth in the leg  signs and symptoms of a stroke such as changes in vision; confusion; trouble speaking or understanding; severe headaches; sudden numbness or weakness of the face, arm or leg; trouble walking; dizziness; loss of coordination This list  may not describe all possible side effects. Call your doctor for medical advice about side effects. You may report side effects to FDA at 1-800-FDA-1088. Where should I keep my medicine? Keep out of the reach of children. Store at room temperature between 20 and 25 degrees C (68 and 77 degrees F). Throw away any unused medicine after the expiration date. NOTE: This sheet is a summary. It may not cover all possible information. If you have questions about this medicine, talk to your doctor, pharmacist, or health care provider.  2020 Elsevier/Gold Standard (2018-05-17 17:39:34)

## 2020-06-12 NOTE — Progress Notes (Signed)
Cardiology Office Note:    Date:  06/12/2020   ID:  MIKHAEL HENDRIKS, DOB 08/17/1946, MRN [id number]  PCP:  [name], MD  Cardiologist:  Buford Dresser, MD  Referring MD: [name], MD   CC: follow up  History of Present Illness:    Larry Reese is a 74 y.o. male with a hx of tobacco use, hypertension, subdural hematoma 2006 who is seen for follow up today. He was initially seen 05/07/20 as a new consult at the request of [name], MD for the evaluation and management of atrial fibrillation, preoperative evaluation.  Cardiac history: At preop visit 05/01/20, noted to be in new atrial fibrillation on ECG. Referred to cardiology for evaluation prior to hip surgery. History reported stroke in 2005, but this actually appears to be a subdural hematoma in 2006.   Today: Hip surgery on hold due to Covid. He has had two severe episodes of hip pain, but none very recently.  Reviewed records from Dr. Harl Bowie. Had an unprovoked subdural hematoma 10/2004. Diagnosed as chronic left hemispheric subdural hematoma. No trauma noted. Required drain placement and ICU admission. Has not recurred. Also noted history of spontaneous pneumothorax and GI bleeding from ulcers. Patient denies any recent issues with this.   We spent significant time today discussing atrial fibrillation, risk of stroke, what a subdural hematoma is. Discussed balance between stroke prevention with anticoagulation and risk of bleeding. Patient's wife is very concerned about long term anticoagulation, knows people who have had significant issues with it. We did discuss Watchman procedure as well, reviewed images of what it is today.  After discussion of risk/ebenefit, they would like to discuss Watchman further with Dr. Quentin Ore prior to deciding on whether or not to trial anticoagulation. They are both very overwhelmed, felt like they came in for hip preop and the finding of afib on his ECG has changed everything.    Denies chest pain, shortness of breath at rest or with normal exertion. No PND, orthopnea, LE edema or unexpected weight gain. No syncope or palpitations.  Past Medical History:  Diagnosis Date   Anemia    Arthritis    Dupuytren's contracture of right hand    Hx of adenomatous colonic polyps    Hypertension    Pneumothorax    Stroke [facility]) 2005   left sided weakness   Trigger finger     Past Surgical History:  Procedure Laterality Date   COLONOSCOPY  May 2010   Dr. fields: 5 cm terminal ileum normal, 6 polyps removed, moderate internal hemorrhoids, simple adenomas, next colonoscopy May 2015   COLONOSCOPY N/A 08/01/2015   Procedure: COLONOSCOPY;  Surgeon: Danie Binder, MD;  Location: [facility] ENDO SUITE;  Service: Endoscopy;  Laterality: N/A;  0830   CRANIOTOMY  2000   pressure   INGUINAL HERNIA REPAIR Right    OLECRANON BURSECTOMY Right 02/11/2015   Procedure: EXCISION RIGHT OLECRANON BURSA;  Surgeon: Sanjuana Kava, MD;  Location: [facility] ORS;  Service: Orthopedics;  Laterality: Right;   repair of trigger finger Left    pinky    Current Medications: Current Outpatient Medications on File Prior to Visit  Medication Sig   ALPRAZolam (XANAX) 0.5 MG tablet Take 0.5 mg by mouth at bedtime as needed for sleep. Reported on 02/19/2016   amLODipine (NORVASC) 10 MG tablet Take 10 mg by mouth daily.   aspirin EC 81 MG tablet Take 81 mg by mouth daily.   diclofenac sodium (VOLTAREN) 1 % GEL Apply 2 g  topically 4 (four) times daily. (Patient taking differently: Apply 2 g topically at bedtime as needed (pain). )   HYDROcodone-acetaminophen (NORCO) 7.5-325 MG tablet Take 1 tablet by mouth every 6 (six) hours as needed for moderate pain. Must last 30 days   tamsulosin (FLOMAX) 0.4 MG CAPS capsule Take 0.4 mg by mouth daily.    vitamin B-12 (CYANOCOBALAMIN) 1000 MCG tablet Take 1,000 mcg by mouth daily.   vitamin E (VITAMIN E) 180 MG (400 UNITS) capsule Take 400 Units by mouth  daily.   Current Facility-Administered Medications on File Prior to Visit  Medication   lidocaine (XYLOCAINE) 1 % (with pres) injection 8 mL   methylPREDNISolone acetate (DEPO-MEDROL) injection 40 mg     Allergies:   Aleve [naproxen], Celecoxib, and Fluoxetine hcl   Social History   Tobacco Use   Smoking status: Current Every Day Smoker    Packs/day: 0.25    Years: 50.00    Pack years: 12.50    Types: Cigarettes   Smokeless tobacco: Never Used   Tobacco comment: 5 cigarettes per day  Vaping Use   Vaping Use: Never used  Substance Use Topics   Alcohol use: Yes    Alcohol/week: 5.0 standard drinks    Types: 5 Cans of beer per week    Comment: 3-4 beers/day   Drug use: No    Family History: family history includes Esophageal cancer in his father. There is no history of Colon cancer.  ROS:   Please see the history of present illness.  Additional pertinent ROS otherwise unremarkable.  EKGs/Labs/Other Studies Reviewed:    The following studies were reviewed today: No prior cardiac studies  EKG:  EKG is personally reviewed.  The ekg ordered 05/08/20 demonstrates atrial fibrillation with slow ventricular response of 57 bpm.  Recent Labs: 05/01/2020: ALT 19; BUN 13; Creatinine, Ser 1.34; Hemoglobin 14.2; Platelets 360; Potassium 4.0; Sodium 134  Recent Lipid Panel No results found for: CHOL, TRIG, HDL, CHOLHDL, VLDL, LDLCALC, LDLDIRECT  Physical Exam:    VS:  BP 138/82    Pulse 77    Ht 6' 6.6" (1.996 m)    Wt 207 lb 3.2 oz (94 kg)    SpO2 98%    BMI 23.58 kg/m     Wt Readings from Last 3 Encounters:  06/12/20 207 lb 3.2 oz (94 kg)  06/10/20 207 lb (93.9 kg)  05/07/20 202 lb 9.6 oz (91.9 kg)    GEN: Well nourished, well developed in no acute distress HEENT: Normal, moist mucous membranes NECK: No JVD CARDIAC: largely regular rhythm on exam today, normal S1 and S2, no rubs or gallops. No murmur. VASCULAR: Radial and DP pulses 2+ bilaterally. No carotid  bruits RESPIRATORY:  Clear to auscultation without rales, wheezing or rhonchi  ABDOMEN: Soft, non-tender, non-distended MUSCULOSKELETAL:  Ambulates independently SKIN: Warm and dry, no edema NEUROLOGIC:  Alert and oriented x 3. No focal neuro deficits noted. PSYCHIATRIC:  Normal affect   ASSESSMENT:    1. Paroxysmal atrial fibrillation (HCC)   2. History of subdural hematoma   3. Preop cardiovascular exam   4. Essential hypertension   5. PAD (peripheral artery disease) (HCC)   6. Pure hypercholesterolemia   7. Cardiac risk counseling    PLAN:    Atrial fibrillation, paroxysmal vs. persistent CHA2DS2/VAS Stroke Risk Points =5 (age, hypertension, CVAx2, PAD) initially, though now appears to have been a subdural hematoma and not a stroke, placing his CHADSVASC=3 -we discussed anticoagulation at length today. Wife is  very hesistant. No recent bleeding, though with prior subdural hematoma (reported as nontraumatic) and remote GI bleeding, we did discuss that there is always a small chance these may recur on anticoagulation. -they both want to prevent stroke if possible. We discussed Watchman in general today. I will refer him to Dr. Quentin Ore to discuss further -gave information on apixaban today to review -no symptoms. Unclear if will need TEE if Watchman pursued; if not, would get echo  Preoperative cardiovascular examination: See prior note, acceptable risk for hip surgery. This is on hold currently due to Covid.  Hypertension: slightly elevated today, was well controlled at visit 06/10/20 -continue amlodipine 10 mg daily  PAD: -continue aspirin for now. If anticoagulation started, would drop aspirin -have discussed statin, tobacco cessation. Precontemplative, continue to address  Hypercholesterolemia: -with PAD, goal LDL <70 -per [facility], lipids 12/14/19 show Tchol 221, HDL 40, LDL 155, TG 139 -will continue to discuss statin   Cardiac risk counseling and prevention  recommendations: -recommend heart healthy/Mediterranean diet, with whole grains, fruits, vegetable, fish, lean meats, nuts, and olive oil. Limit salt. -recommend moderate walking, 3-5 times/week for 30-50 minutes each session. Aim for at least 150 minutes.week. Goal should be pace of 3 miles/hours, or walking 1.5 miles in 30 minutes -recommend avoidance of tobacco products. Avoid excess alcohol.  Plan for follow up: will refer to Dr. Quentin Ore to discuss [place]. Will follow up in 6 mos or sooner as needed  Total time of encounter: 49 minutes total time of encounter, including 34 minutes spent in face-to-face patient care. This time includes coordination of care and counseling regarding high complexity medical decision making re: anticoagulation. Remainder of non-face-to-face time involved reviewing chart documents/testing relevant to the patient encounter and documentation in the medical record.  Buford Dresser, MD, PhD [place]   [facility]   Medication Adjustments/Labs and Tests Ordered: Current medicines are reviewed at length with the patient today.  Concerns regarding medicines are outlined above.  Orders Placed This Encounter  Procedures   Ambulatory referral to Cardiac Electrophysiology   No orders of the defined types were placed in this encounter.   Patient Instructions  Medication Instructions:  Your Physician recommend you continue on your current medication as directed.    *If you need a refill on your cardiac medications before your next appointment, please call your pharmacy*   Lab Work: None ordered  Testing/Procedures: None ordered    Follow-Up: At [facility], you and your health needs are our priority.  As part of our continuing mission to provide you with exceptional heart care, we have created designated Provider Care Teams.  These Care Teams include your primary Cardiologist (physician) and Advanced Practice Providers (APPs -  Physician  Assistants and Nurse Practitioners) who all work together to provide you with the care you need, when you need it.  We recommend signing up for the patient portal called "MyChart".  Sign up information is provided on this After Visit Summary.  MyChart is used to connect with patients for Virtual Visits (Telemedicine).  Patients are able to view lab/test results, encounter notes, upcoming appointments, etc.  Non-urgent messages can be sent to your provider as well.   To learn more about what you can do with MyChart, go to [email or web address].    Your next appointment:   6 month(s)  The format for your next appointment:   In Person  Provider:   Buford Dresser, MD    Apixaban oral tablets What is this medicine? APIXABAN (a  PIX a ban) is an anticoagulant (blood thinner). It is used to lower the chance of stroke in people with a medical condition called atrial fibrillation. It is also used to treat or prevent blood clots in the lungs or in the veins. This medicine may be used for other purposes; ask your health care provider or pharmacist if you have questions. COMMON BRAND NAME(S): Eliquis What should I tell my health care provider before I take this medicine? They need to know if you have any of these conditions:  antiphospholipid antibody syndrome  bleeding disorders  bleeding in the brain  blood in your stools (black or tarry stools) or if you have blood in your vomit  history of blood clots  history of stomach bleeding  kidney disease  liver disease  mechanical heart valve  an unusual or allergic reaction to apixaban, other medicines, foods, dyes, or preservatives  pregnant or trying to get pregnant  breast-feeding How should I use this medicine? Take this medicine by mouth with a glass of water. Follow the directions on the prescription label. You can take it with or without food. If it upsets your stomach, take it with food. Take your medicine at regular  intervals. Do not take it more often than directed. Do not stop taking except on your doctor's advice. Stopping this medicine may increase your risk of a blood clot. Be sure to refill your prescription before you run out of medicine. Talk to your pediatrician regarding the use of this medicine in children. Special care may be needed. Overdosage: If you think you have taken too much of this medicine contact a poison control center or emergency room at once. NOTE: This medicine is only for you. Do not share this medicine with others. What if I miss a dose? If you miss a dose, take it as soon as you can. If it is almost time for your next dose, take only that dose. Do not take double or extra doses. What may interact with this medicine? This medicine may interact with the following:  aspirin and aspirin-like medicines  certain medicines for fungal infections like ketoconazole and itraconazole  certain medicines for seizures like carbamazepine and phenytoin  certain medicines that treat or prevent blood clots like warfarin, enoxaparin, and dalteparin  clarithromycin  NSAIDs, medicines for pain and inflammation, like ibuprofen or naproxen  rifampin  ritonavir  St. John's wort This list may not describe all possible interactions. Give your health care provider a list of all the medicines, herbs, non-prescription drugs, or dietary supplements you use. Also tell them if you smoke, drink alcohol, or use illegal drugs. Some items may interact with your medicine. What should I watch for while using this medicine? Visit your healthcare professional for regular checks on your progress. You may need blood work done while you are taking this medicine. Your condition will be monitored carefully while you are receiving this medicine. It is important not to miss any appointments. Avoid sports and activities that might cause injury while you are using this medicine. Severe falls or injuries can cause  unseen bleeding. Be careful when using sharp tools or knives. Consider using an Copy. Take special care brushing or flossing your teeth. Report any injuries, bruising, or red spots on the skin to your healthcare professional. If you are going to need surgery or other procedure, tell your healthcare professional that you are taking this medicine. Wear a medical ID bracelet or chain. Carry a card that describes your  disease and details of your medicine and dosage times. What side effects may I notice from receiving this medicine? Side effects that you should report to your doctor or health care professional as soon as possible:  allergic reactions like skin rash, itching or hives, swelling of the face, lips, or tongue  signs and symptoms of bleeding such as bloody or black, tarry stools; red or dark-brown urine; spitting up blood or brown material that looks like coffee grounds; red spots on the skin; unusual bruising or bleeding from the eye, gums, or nose  signs and symptoms of a blood clot such as chest pain; shortness of breath; pain, swelling, or warmth in the leg  signs and symptoms of a stroke such as changes in vision; confusion; trouble speaking or understanding; severe headaches; sudden numbness or weakness of the face, arm or leg; trouble walking; dizziness; loss of coordination This list may not describe all possible side effects. Call your doctor for medical advice about side effects. You may report side effects to FDA at 1-800-FDA-1088. Where should I keep my medicine? Keep out of the reach of children. Store at room temperature between 20 and 25 degrees C (68 and 77 degrees F). Throw away any unused medicine after the expiration date. NOTE: This sheet is a summary. It may not cover all possible information. If you have questions about this medicine, talk to your doctor, pharmacist, or health care provider.  2020 Elsevier/Gold Standard (2018-05-17 17:39:34)     Signed, Buford Dresser, MD PhD 06/12/2020   [facility]

## 2020-06-16 ENCOUNTER — Institutional Professional Consult (permissible substitution): Payer: PPO | Admitting: Cardiology

## 2020-06-19 ENCOUNTER — Ambulatory Visit: Payer: PPO | Attending: Internal Medicine

## 2020-06-19 DIAGNOSIS — Z23 Encounter for immunization: Secondary | ICD-10-CM

## 2020-06-19 NOTE — Progress Notes (Signed)
   Covid-19 Vaccination Clinic  Name:  Larry Reese    MRN: [id number] DOB: [birth year]  06/19/2020  [name] was observed post Covid-19 immunization for 15 minutes without incident. He was provided with Vaccine Information Sheet and instruction to access the V-Safe system.   [name] was instructed to call 911 with any severe reactions post vaccine: Marland Kitchen Difficulty breathing  . Swelling of face and throat  . A fast heartbeat  . A bad rash all over body  . Dizziness and weakness

## 2020-07-02 ENCOUNTER — Ambulatory Visit: Payer: PPO | Admitting: Cardiology

## 2020-07-02 ENCOUNTER — Other Ambulatory Visit: Payer: Self-pay

## 2020-07-02 VITALS — BP 118/77 | HR 77 | Ht 78.5 in | Wt 202.0 lb

## 2020-07-02 DIAGNOSIS — I480 Paroxysmal atrial fibrillation: Secondary | ICD-10-CM | POA: Diagnosis not present
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
## 2020-07-02 NOTE — Patient Instructions (Addendum)
Medication Instructions:  Your physician recommends that you continue on your current medications as directed. Please refer to the Current Medication list given to you today. *If you need a refill on your cardiac medications before your next appointment, please call your pharmacy*  Lab Work: None ordered. If you have labs (blood work) drawn today and your tests are completely normal, you will receive your results only by:  [place] (if you have MyChart) OR  A paper copy in the mail If you have any lab test that is abnormal or we need to change your treatment, we will call you to review the results.  Testing/Procedures:  Left Atrial Appendage Closure Device Implantation Left atrial appendage (LAA) closure device implantation is a procedure that is done to place a small device in the LAA of the heart. The left atrium is one of the heart's two upper chambers, and the LAA is a small sac in the wall of the left atrium. The device closes the LAA. This procedure can help prevent a stroke caused by atrial fibrillation. Atrial fibrillation is a type of irregular or rapid heartbeat (arrhythmia). When the heart does not beat normally and the heartbeat is irregular, there is an increased risk of blood clots and stroke. A blood clot can form in the LAA. Follow-Up: At [facility], you and your health needs are our priority.  As part of our continuing mission to provide you with exceptional heart care, we have created designated Provider Care Teams.  These Care Teams include your primary Cardiologist (physician) and Advanced Practice Providers (APPs -  Physician Assistants and Nurse Practitioners) who all work together to provide you with the care you need, when you need it.  We recommend signing up for the patient portal called "MyChart".  Sign up information is provided on this After Visit Summary.  MyChart is used to connect with patients for Virtual Visits (Telemedicine).  Patients are able to  view lab/test results, encounter notes, upcoming appointments, etc.  Non-urgent messages can be sent to your provider as well.   To learn more about what you can do with MyChart, go to [email or web address].    Your next appointment:   Your physician wants you to follow-up in: as needed with Dr. Quentin Ore.

## 2020-07-07 DIAGNOSIS — I739 Peripheral vascular disease, unspecified: Secondary | ICD-10-CM | POA: Diagnosis not present

## 2020-07-07 DIAGNOSIS — I10 Essential (primary) hypertension: Secondary | ICD-10-CM | POA: Diagnosis not present

## 2020-07-08 ENCOUNTER — Ambulatory Visit: Payer: PPO | Admitting: Orthopaedic Surgery

## 2020-07-08 ENCOUNTER — Ambulatory Visit: Payer: Self-pay

## 2020-07-08 ENCOUNTER — Encounter: Payer: Self-pay | Admitting: Orthopaedic Surgery

## 2020-07-08 DIAGNOSIS — M87051 Idiopathic aseptic necrosis of right femur: Secondary | ICD-10-CM

## 2020-07-08 NOTE — Progress Notes (Signed)
Office Visit Note   Patient: Larry Reese           Date of Birth: [birth year]           MRN: [id number] Visit Date: 07/08/2020              Requested by: [name], MD [place],  [place] [address] PCP: [name], MD   Assessment & Plan: Visit Diagnoses:  1. Avascular necrosis of bone of right hip (HCC)     Plan: Impression is right hip avascular necrosis.  We have referred the patient to Dr. Junius Roads for ultrasound-guided cortisone injection to the right hip.  He will follow up with [place] when he is ready and able to proceed with total hip arthroplasty.  Follow-Up Instructions: No follow-ups on file.   Orders:  Orders Placed This Encounter  Procedures  . US Guided Needle Placement - No Linked Charges   No orders of the defined types were placed in this encounter.     Procedures: No procedures performed   Clinical Data: No additional findings.   Subjective: Chief Complaint  Patient presents with  . Right Hip - Pain    HPI patient is a very pleasant 74 year old gentleman who comes in today with continued right hip pain.  History of avascular necrosis to the right hip.  He has been interested in proceeding with right total hip arthroplasty but has not been cleared.  He is undergoing stent placement in the near future and would like a cortisone injection to the right hip to hold him over until he is able to proceed with total hip arthroplasty.  Review of Systems as detailed in HPI.  All others reviewed and are negative.   Objective: Vital Signs: There were no vitals taken for this visit.  Physical Exam well-developed well-nourished gentleman in no acute distress.  Alert oriented x3.  Ortho Exam right hip exam shows a positive logroll [place] very little internal rotation.  Positive FADIR.  He is neurovascular intact distally.  Specialty Comments:  No specialty comments available.  Imaging: No new imaging   PMFS History: Patient  Active Problem List   Diagnosis Date Noted  . History of subdural hematoma 06/12/2020  . PAD (peripheral artery disease) ([place]) 05/27/2020  . Essential hypertension 05/27/2020  . Atrial fibrillation ([place]) 05/27/2020  . Chronic toe pain, right foot 02/20/2019  . Hematoma of left hip 02/20/2019  . Avascular necrosis of bone of right hip ([place]) 01/23/2019  . Pain in left hip 01/23/2019  . Rib pain on left side 01/23/2019  . History of colonic polyps   . Hepatitis, alcoholic 17/35/6701  . ETOH abuse 02/10/2015  . Internal hemorrhoids 07/17/2009  . Colon adenomas 07/15/2009   Past Medical History:  Diagnosis Date  . Anemia   . Arthritis   . Dupuytren's contracture of right hand   . Hx of adenomatous colonic polyps   . Hypertension   . Pneumothorax   . Stroke [facility]) 2005   left sided weakness  . Trigger finger     Family History  Problem Relation Age of Onset  . Esophageal cancer Father   . Colon cancer Neg Hx     Past Surgical History:  Procedure Laterality Date  . COLONOSCOPY  May 2010   Dr. fields: 5 cm terminal ileum normal, 6 polyps removed, moderate internal hemorrhoids, simple adenomas, next colonoscopy May 2015  . COLONOSCOPY N/A 08/01/2015   Procedure: COLONOSCOPY;  Surgeon: Danie Binder,  MD;  Location: [facility] END[facility];  Service: Endoscopy;  Laterality: N/A;  0830  . CRANIOTOMY  2000   pressure  . INGUINAL HERNIA REPAIR Right   . OLECRANON BURSECTOMY Right 02/11/2015   Procedure: EXCISION RIGHT OLECRANON BURSA;  Surgeon: Sanjuana Kava, MD;  Location: [facility] ORS;  Service: Orthopedics;  Laterality: Right;  . repair of trigger finger Left    pinky   Social History   Occupational History  . Occupation: retired  Tobacco Use  . Smoking status: Current Every Day Smoker    Packs/day: 0.25    Years: 50.00    Pack years: 12.50    Types: Cigarettes  . Smokeless tobacco: Never Used  . Tobacco comment: 5 cigarettes per day  Vaping Use  . Vaping Use: Never used  Substance  and Sexual Activity  . Alcohol use: Yes    Alcohol/week: 5.0 standard drinks    Types: 5 Cans of beer per week    Comment: 3-4 beers/day  . Drug use: No  . Sexual activity: Yes    Birth control/protection: None

## 2020-07-08 NOTE — Progress Notes (Signed)
Subjective: Patient is here for ultrasound-guided intra-articular right hip injection.   Pain from AVN.  Objective:  Pain with passive IR.  Procedure: Ultrasound-guided right hip injection: After sterile prep with Betadine, injected 8 cc 1% lidocaine without epinephrine and 40 mg methylprednisolone using a 22-gauge spinal needle, passing the needle through the iliofemoral ligament into the femoral head/neck junction.  Injectate seen filling joint capsule.  Good immediate relief.

## 2020-07-09 ENCOUNTER — Telehealth: Payer: Self-pay | Admitting: Orthopaedic Surgery

## 2020-07-09 ENCOUNTER — Telehealth: Payer: Self-pay | Admitting: Nurse Practitioner

## 2020-07-09 ENCOUNTER — Telehealth: Payer: Self-pay | Admitting: Cardiology

## 2020-07-09 MED ORDER — HYDROCODONE-ACETAMINOPHEN 7.5-325 MG PO TABS: 1.0000 | ORAL_TABLET | Freq: Four times a day (QID) | ORAL | 0 refills | Status: DC | PRN

## 2020-07-09 NOTE — Telephone Encounter (Signed)
Hydrocodone-Acetaminophen 7.5/325mg   Qty 60 Tablets  PATIENT USES [facility]

## 2020-07-09 NOTE — Telephone Encounter (Signed)
Pt advised will forward his message to Dr. Mardene Speak nurse for a call back re: his procedure.

## 2020-07-09 NOTE — Telephone Encounter (Signed)
See phone note per Ambrose Pancoast for follow up.

## 2020-07-09 NOTE — Telephone Encounter (Signed)
New message:     Patient would like for some one to call him.patient some one was going to call him.

## 2020-07-09 NOTE — Telephone Encounter (Signed)
Call placed this afternoon to Lindy regarding the WATCHMAN program.Patient unavailable and call never went to voicemail to leave message. I was able to reach his wife Roylene and she advised that it is best to call back in the morning and that he is interested in discussing the procedure further. I will return call tomorrow morning to discuss further.     [facility] [phone]

## 2020-07-10 ENCOUNTER — Telehealth: Payer: Self-pay

## 2020-07-10 ENCOUNTER — Telehealth: Payer: Self-pay | Admitting: Nurse Practitioner

## 2020-07-10 DIAGNOSIS — I4891 Unspecified atrial fibrillation: Secondary | ICD-10-CM

## 2020-07-10 DIAGNOSIS — Z23 Encounter for immunization: Secondary | ICD-10-CM | POA: Diagnosis not present

## 2020-07-10 DIAGNOSIS — I480 Paroxysmal atrial fibrillation: Secondary | ICD-10-CM

## 2020-07-10 NOTE — Telephone Encounter (Signed)
Call placed to Pt's wife.  Pt will come to [facility] office 07/14/2020 at 11:00 am for BMP and cardiac CT instructions

## 2020-07-10 NOTE — Telephone Encounter (Signed)
Contacted patient today and discussed his interest in the [facility] program. We discussed the procedure in detail and I explained the next steps in the work up process involving a cardiac CT scan. Patient agreed and would wish to proceed with work up process at this time I will contact pre cert and scheduling to begin process.  [place] Program Coordinator [phone]

## 2020-07-10 NOTE — Telephone Encounter (Signed)
Order placed for ECHO

## 2020-07-10 NOTE — Addendum Note (Signed)
Addended by: Tempie Donning on: 07/10/2020 10:57 AM   Modules accepted: Orders

## 2020-07-14 ENCOUNTER — Other Ambulatory Visit: Payer: Self-pay

## 2020-07-14 ENCOUNTER — Other Ambulatory Visit: Payer: PPO

## 2020-07-14 DIAGNOSIS — I480 Paroxysmal atrial fibrillation: Secondary | ICD-10-CM | POA: Diagnosis not present

## 2020-07-14 LAB — BASIC METABOLIC PANEL
BUN/Creatinine Ratio: 9 — ABNORMAL LOW (ref 10–24)
BUN: 11 mg/dL (ref 8–27)
CO2: 21 mmol/L (ref 20–29)
Calcium: 9.6 mg/dL (ref 8.6–10.2)
Chloride: 99 mmol/L (ref 96–106)
Creatinine, Ser: 1.24 mg/dL (ref 0.76–1.27)
GFR calc Af Amer: 66 mL/min/{1.73_m2} (ref >59)
GFR calc non Af Amer: 57 mL/min/{1.73_m2} — ABNORMAL LOW (ref >59)
Glucose: 98 mg/dL (ref 65–99)
Potassium: 4.5 mmol/L (ref 3.5–5.2)
Sodium: 136 mmol/L (ref 134–144)

## 2020-07-14 MED ORDER — METOPROLOL TARTRATE 50 MG PO TABS
ORAL_TABLET | ORAL | 0 refills | Status: DC
Start: 2020-07-14 — End: 2020-08-20

## 2020-07-14 NOTE — Telephone Encounter (Signed)
Met with family.  Went over cardiac CT instructions.  Advised they would receive a phone call to schedule an Echo at the [facility] office.  All questions answered.

## 2020-07-17 ENCOUNTER — Other Ambulatory Visit: Payer: PPO

## 2020-07-18 ENCOUNTER — Telehealth (HOSPITAL_COMMUNITY): Payer: Self-pay | Admitting: Emergency Medicine

## 2020-07-18 NOTE — Telephone Encounter (Signed)
Reaching out to patient to offer assistance regarding upcoming cardiac imaging study; pt verbalizes understanding of appt date/time, parking situation and where to check in, pre-test NPO status and medications ordered, and verified current allergies; name and call back number provided for further questions should they arise Marchia Bond RN Navigator Cardiac Imaging [place] [facility] [phone] office [phone] cell   Pt verbalized understanding to take PO metoprolol 2 hr prior to scan. Clarise Cruz

## 2020-07-21 ENCOUNTER — Ambulatory Visit (HOSPITAL_COMMUNITY)
Admission: RE | Admit: 2020-07-21 | Discharge: 2020-07-21 | Disposition: A | Discharge status: Home / Self Care | Payer: PPO | Source: Ambulatory Visit | Attending: Cardiology | Admitting: Cardiology

## 2020-07-21 ENCOUNTER — Other Ambulatory Visit: Payer: Self-pay

## 2020-07-21 DIAGNOSIS — I4891 Unspecified atrial fibrillation: Secondary | ICD-10-CM | POA: Diagnosis not present

## 2020-07-21 MED ORDER — IOHEXOL 350 MG/ML SOLN
80.0000 mL | Freq: Once | INTRAVENOUS | Status: AC | PRN
  Administered 2020-07-21: 80 mL via INTRAVENOUS

## 2020-08-04 ENCOUNTER — Ambulatory Visit (HOSPITAL_COMMUNITY): Payer: PPO | Attending: Cardiology

## 2020-08-04 ENCOUNTER — Other Ambulatory Visit: Payer: Self-pay

## 2020-08-04 DIAGNOSIS — I480 Paroxysmal atrial fibrillation: Secondary | ICD-10-CM

## 2020-08-04 LAB — ECHOCARDIOGRAM COMPLETE: S' Lateral: 3.3 cm

## 2020-08-07 ENCOUNTER — Telehealth: Payer: Self-pay | Admitting: Nurse Practitioner

## 2020-08-07 ENCOUNTER — Telehealth: Payer: Self-pay | Admitting: Orthopaedic Surgery

## 2020-08-07 DIAGNOSIS — M1990 Unspecified osteoarthritis, unspecified site: Secondary | ICD-10-CM | POA: Diagnosis not present

## 2020-08-07 DIAGNOSIS — I10 Essential (primary) hypertension: Secondary | ICD-10-CM | POA: Diagnosis not present

## 2020-08-07 MED ORDER — HYDROCODONE-ACETAMINOPHEN 7.5-325 MG PO TABS: 1.0000 | ORAL_TABLET | Freq: Four times a day (QID) | ORAL | 0 refills | Status: DC | PRN

## 2020-08-07 NOTE — Telephone Encounter (Signed)
Patient contacted this morning to determine interest in having Watchman device implanted on 12/16.Pateint and wife both in agreement to proceed with implant. Appointment in AF clinic will be scheduled and I provided my contact information for any questions prior to the procedural date.  Ambrose Pancoast, RN Watchman Coordinator

## 2020-08-07 NOTE — Telephone Encounter (Signed)
Patient request refill on his pain medicine   HYDROcodone-Acetaminophen (Tab) NORCO 7.5-325 MG Take 1 tablet by mouth every 6 (six) hours as needed for moderate pain. Must last 30 days     [place]

## 2020-08-20 ENCOUNTER — Other Ambulatory Visit: Payer: Self-pay

## 2020-08-20 ENCOUNTER — Encounter (HOSPITAL_COMMUNITY): Payer: Self-pay | Admitting: Physician Assistant

## 2020-08-20 ENCOUNTER — Ambulatory Visit (HOSPITAL_COMMUNITY)
Admission: RE | Admit: 2020-08-20 | Discharge: 2020-08-20 | Disposition: A | Discharge status: Home / Self Care | Payer: PPO | Source: Ambulatory Visit | Attending: Physician Assistant | Admitting: Physician Assistant

## 2020-08-20 VITALS — BP 102/70 | HR 81 | Ht 78.5 in | Wt 210.6 lb

## 2020-08-20 DIAGNOSIS — I10 Essential (primary) hypertension: Secondary | ICD-10-CM | POA: Insufficient documentation

## 2020-08-20 DIAGNOSIS — Z7982 Long term (current) use of aspirin: Secondary | ICD-10-CM | POA: Diagnosis not present

## 2020-08-20 DIAGNOSIS — I480 Paroxysmal atrial fibrillation: Secondary | ICD-10-CM | POA: Insufficient documentation

## 2020-08-20 DIAGNOSIS — F17210 Nicotine dependence, cigarettes, uncomplicated: Secondary | ICD-10-CM | POA: Insufficient documentation

## 2020-08-20 DIAGNOSIS — Z79899 Other long term (current) drug therapy: Secondary | ICD-10-CM | POA: Insufficient documentation

## 2020-08-20 DIAGNOSIS — I4892 Unspecified atrial flutter: Secondary | ICD-10-CM | POA: Diagnosis not present

## 2020-08-20 DIAGNOSIS — D6869 Other thrombophilia: Secondary | ICD-10-CM | POA: Diagnosis not present

## 2020-08-20 DIAGNOSIS — I483 Typical atrial flutter: Secondary | ICD-10-CM | POA: Insufficient documentation

## 2020-08-20 LAB — CBC
HCT: 43.2 % (ref 39.0–52.0)
Hemoglobin: 14.4 g/dL (ref 13.0–17.0)
MCH: 32.3 pg (ref 26.0–34.0)
MCHC: 33.3 g/dL (ref 30.0–36.0)
MCV: 96.9 fL (ref 80.0–100.0)
Platelets: 274 10*3/uL (ref 150–400)
RBC: 4.46 MIL/uL (ref 4.22–5.81)
RDW: 12.8 % (ref 11.5–15.5)
WBC: 6.3 10*3/uL (ref 4.0–10.5)
nRBC: 0 % (ref 0.0–0.2)

## 2020-08-20 LAB — BASIC METABOLIC PANEL
Anion gap: 13 (ref 5–15)
BUN: 12 mg/dL (ref 8–23)
CO2: 20 mmol/L — ABNORMAL LOW (ref 22–32)
Calcium: 9 mg/dL (ref 8.9–10.3)
Chloride: 100 mmol/L (ref 98–111)
Creatinine, Ser: 1.42 mg/dL — ABNORMAL HIGH (ref 0.61–1.24)
GFR, Estimated: 52 mL/min — ABNORMAL LOW (ref >60)
Glucose, Bld: 105 mg/dL — ABNORMAL HIGH (ref 70–99)
Potassium: 3.8 mmol/L (ref 3.5–5.1)
Sodium: 133 mmol/L — ABNORMAL LOW (ref 135–145)

## 2020-08-20 MED ORDER — APIXABAN 5 MG PO TABS: 5.0000 mg | ORAL_TABLET | Freq: Two times a day (BID) | ORAL | 3 refills | Status: DC

## 2020-08-20 NOTE — Progress Notes (Signed)
Primary Care Physician: [name], MD Primary Cardiologist: [name]  Primary Electrophysiologist: [name] Referring Physician: Dr [name] [name] is a 74 y.o. male with a history of HTN, subdural hematoma 2006, and paroxysmal atrial fibrillation who presents for follow up in the [facility]. Patient is not on anticoagulation for a CHADS2VASC score of 5. Patient seen by [name] 07/02/20 and Watchman implant was recommended. Patient had a cardiac CT 07/21/20 which showed Chicken wing morphology. He is in rate controlled atrial flutter today but denies any symptoms.   Today, he denies symptoms of palpitations, chest pain, shortness of breath, orthopnea, PND, lower extremity edema, dizziness, presyncope, syncope, snoring, daytime somnolence, bleeding, or neurologic sequela. The patient is tolerating medications without difficulties and is otherwise without complaint today.    Atrial Fibrillation Risk Factors:  he does not have symptoms or diagnosis of sleep apnea. he does not have a history of rheumatic fever. he does have a history of alcohol use.   he has a BMI of Body mass index is 24.03 kg/m.Marland Kitchen Filed Weights   08/20/20 1325  Weight: 95.5 kg    Family History  Problem Relation Age of Onset   Esophageal cancer Father    Colon cancer Neg Hx      Atrial Fibrillation Management history:  Previous antiarrhythmic drugs: none Previous cardioversions: none Previous ablations: none CHADS2VASC score: 5 Anticoagulation history: none   Past Medical History:  Diagnosis Date   Anemia    Arthritis    Dupuytren's contracture of right hand    Hx of adenomatous colonic polyps    Hypertension    Pneumothorax    Stroke [facility]) 2005   left sided weakness   Trigger finger    Past Surgical History:  Procedure Laterality Date   COLONOSCOPY  May 2010   Dr. fields: 5 cm terminal ileum normal, 6 polyps removed, moderate  internal hemorrhoids, simple adenomas, next colonoscopy May 2015   COLONOSCOPY N/A 08/01/2015   Procedure: COLONOSCOPY;  Surgeon: Danie Binder, MD;  [facility];  Service: Endoscopy;  Laterality: N/A;  0830   CRANIOTOMY  2000   pressure   INGUINAL HERNIA REPAIR Right    OLECRANON BURSECTOMY Right 02/11/2015   Procedure: EXCISION RIGHT OLECRANON BURSA;  Surgeon: Sanjuana Kava, MD;  Location: [facility];  Service: Orthopedics;  Laterality: Right;   repair of trigger finger Left    pinky    Current Outpatient Medications  Medication Sig Dispense Refill   ALPRAZolam (XANAX) 0.5 MG tablet Take 0.5 mg by mouth at bedtime as needed for sleep. Reported on 02/19/2016     amLODipine (NORVASC) 10 MG tablet Take 10 mg by mouth daily.     aspirin EC 81 MG tablet Take 81 mg by mouth daily.     diclofenac sodium (VOLTAREN) 1 % GEL Apply 2 g topically 4 (four) times daily. 100 g 0   HYDROcodone-acetaminophen (NORCO) 7.5-325 MG tablet Take 1 tablet by mouth every 6 (six) hours as needed for moderate pain. Must last 30 days 60 tablet 0   tamsulosin (FLOMAX) 0.4 MG CAPS capsule Take 0.4 mg by mouth daily.      vitamin B-12 (CYANOCOBALAMIN) 1000 MCG tablet Take 1,000 mcg by mouth daily.     vitamin E (VITAMIN E) 180 MG (400 UNITS) capsule Take 400 Units by mouth daily.     apixaban (ELIQUIS) 5 MG TABS tablet Take 1 tablet (5 mg total) by  mouth 2 (two) times daily. 60 tablet 3   Current Facility-Administered Medications  Medication Dose Route Frequency Provider Last Rate Last Admin   lidocaine (XYLOCAINE) 1 % (with pres) injection 8 mL  8 mL Infiltration Once Hilts, Michael, MD       methylPREDNISolone acetate (DEPO-MEDROL) injection 40 mg  40 mg Intra-articular Once Hilts, Michael, MD        Allergies  Allergen Reactions   Aleve [Naproxen] Other (See Comments)    Numbness in extremities    Celecoxib Other (See Comments)    Celebrex. Nose bleed.   Fluoxetine Hcl Swelling     Social History   Socioeconomic History   Marital status: Married    Spouse name: Not on file   Number of children: 3   Years of education: Not on file   Highest education level: Not on file  Occupational History   Occupation: retired  Tobacco Use   Smoking status: Current Every Day Smoker    Packs/day: 0.25    Years: 50.00    Pack years: 12.50    Types: Cigarettes   Smokeless tobacco: Never Used   Tobacco comment: 5-6 cigarettes per day  Vaping Use   Vaping Use: Never used  Substance and Sexual Activity   Alcohol use: Yes    Alcohol/week: 5.0 - 6.0 standard drinks    Types: 5 - 6 Cans of beer per week    Comment: 3-4 beers/day   Drug use: No   Sexual activity: Yes    Birth control/protection: None  Other Topics Concern   Not on file  Social History Narrative   Not on file   Social Determinants of Health   Financial Resource Strain:    Difficulty of Paying Living Expenses: Not on file  Food Insecurity:    Worried About Charity fundraiser in the Last Year: Not on file   [employer] of Food in the Last Year: Not on file  Transportation Needs:    Lack of Transportation (Medical): Not on file   Lack of Transportation (Non-Medical): Not on file  Physical Activity:    Days of Exercise per Week: Not on file   Minutes of Exercise per Session: Not on file  Stress:    Feeling of Stress : Not on file  Social Connections:    Frequency of Communication with Friends and Family: Not on file   Frequency of Social Gatherings with Friends and Family: Not on file   Attends Religious Services: Not on file   Active Member of Clubs or Organizations: Not on file   Attends Archivist Meetings: Not on file   Marital Status: Not on file  Intimate Partner Violence:    Fear of Current or Ex-Partner: Not on file   Emotionally Abused: Not on file   Physically Abused: Not on file   Sexually Abused: Not on file     ROS- All systems are  reviewed and negative except as per the HPI above.  Physical Exam: Vitals:   08/20/20 1325  BP: 102/70  Pulse: 81  Weight: 95.5 kg  Height: 6' 6.5" (1.994 m)    GEN- The patient is well appearing, alert and oriented x 3 today.   Head- normocephalic, atraumatic Eyes-  Sclera clear, conjunctiva pink Ears- hearing intact Oropharynx- clear Neck- supple  Lungs- Clear to ausculation bilaterally, normal work of breathing Heart- irregular rate and rhythm, no murmurs, rubs or gallops  GI- soft, NT, ND, + BS Extremities- no clubbing,  cyanosis, or edema MS- no significant deformity or atrophy Skin- no rash or lesion Psych- euthymic mood, full affect Neuro- strength and sensation are intact  Wt Readings from Last 3 Encounters:  08/20/20 95.5 kg  07/02/20 91.6 kg  06/12/20 94 kg    EKG today demonstrates typical atrial flutter with variable block HR 81, QRS 72, QTc 408  Echo 08/04/20 demonstrated  1. Left ventricular ejection fraction, by estimation, is 60 to 65%. The  left ventricle has normal function. The left ventricle has no regional  wall motion abnormalities. Left ventricular diastolic function could not  be evaluated.  2. Right ventricular systolic function is normal. The right ventricular  size is normal. There is normal pulmonary artery systolic pressure. The  estimated right ventricular systolic pressure is 54.5 mmHg.  3. Left atrial size was mildly dilated.  4. The mitral valve is normal in structure. Mild mitral valve  regurgitation. No evidence of mitral stenosis.  5. The aortic valve is normal in structure. Aortic valve regurgitation is  not visualized. No aortic stenosis is present.  6. Aortic dilatation noted. There is mild dilatation of the aortic root,  measuring 40 mm. There is moderate to severe dilatation of the ascending  aorta, measuring 50 mm.  7. The inferior vena cava is normal in size with greater than 50%  respiratory variability, suggesting  right atrial pressure of 3 mmHg.  Epic records are reviewed at length today  CHA2DS2-VASc Score = 5  The patient's score is based upon: CHF History: 0 HTN History: 1 Diabetes History: 0 Stroke History: 2 Vascular Disease History: 1 Age Score: 1 Gender Score: 0      ASSESSMENT AND PLAN: 1. Paroxysmal Atrial Fibrillation/atrial flutter The patient's CHA2DS2-VASc score is 5, indicating a 7.2% annual risk of stroke.   He has a history of paroxysmal atrial fibrillation. This patient's CHA2DS2-VASc Score and unadjusted Ischemic Stroke Rate (% per year) is equal to 7.2 % stroke rate/year from a score of 5 which necessitates long term oral anticoagulation to prevent stroke. HasBled score is 5.  Unfortunately, He is not felt to be a long term anticoagulation candidate secondary to h/o unprovoked subdural hematoma.  Patient felt to be a good candidate for short term oral anticoagulation.  Procedural risks for the Watchman implant again reviewed with the patient. Patient scheduled for Watchman implant 09/04/20.  Cardiac CT 07/21/20 showed Chicken wing morphology LAA suitable for a 27 mm Watchman FLX.  Will start Eliquis 5 mg BID today in anticipation of implant. Will hold morning of procedure. Continue ASA 81 mg daily Continue Lopressor 50 mg BID CXR 05/01/20 reviewed. Check bmet/CBC today. Lifestyle modification was discussed including smoking and alcohol cessation.   2. Secondary Hypercoagulable State (ICD10:  D68.69) The patient is at significant risk for stroke/thromboembolism based upon his CHA2DS2-VASc Score of 5.  However, the patient is not on anticoagulation due to his high bleeding risk. Start Eliquis, see discussion above.  3. HTN Stable, no changes today.   Follow up for Watchman implant 09/04/20. Instructions given.   [facility] [address] [place], [place] [address] [phone] 08/20/2020 1:52 PM

## 2020-08-20 NOTE — Patient Instructions (Addendum)
Start Eliquis 5mg  twice a day   Go for Covid testing on Tuesday, December 14th at 1:45pm. Go to [place] [id number]   LEFT ATRIAL APPENDAGE CLOSURE INSTRUCTIONS:  You are scheduled for a Left Atrial Appendage Device Implantation on Thursday, December 16th  1. Please arrive at the [facility] (Main Entrance A) at [facility]: [address] [name], [name] at [name]   (This time is two hours before your procedure to ensure your preparation). Free valet parking service is available. You are allowed ONE visitor in the waiting room during your procedure. Both you and your guest must wear masks. Special note: Every effort is made to have your procedure done on time. Please understand that emergencies sometimes delay scheduled procedures.  2.   Do not eat or drink after midnight prior to your procedure.     3.   Medication Instructions:    Take ONLY the listed medications morning of procedure with a sip of water: Aspirin and Norvasc (amlodipine)      4. Plan for one night stay--bring personal belongings. When you are discharged, you will need someone to drive you home and stay with you for 24 hours.  5. Bring a current list of your medications and current insurance cards.  6. Please wear clothes that are easy to get on and off and wear slip-on shoes.  7. Larry Reese, the [facility], will arrange follow-up for you during your hospital stay and you will be discharged with your appointment dates/times. His direct number is [phone] if you need assistance.  **If you have any questions, do not hesitate to call the office! You will also be contacted the week of your procedure to confirm instructions.**

## 2020-09-02 ENCOUNTER — Encounter: Payer: Self-pay | Admitting: Orthopaedic Surgery

## 2020-09-02 ENCOUNTER — Telehealth: Payer: Self-pay | Admitting: Nurse Practitioner

## 2020-09-02 ENCOUNTER — Other Ambulatory Visit (HOSPITAL_COMMUNITY)
Admission: RE | Admit: 2020-09-02 | Discharge: 2020-09-02 | Disposition: A | Discharge status: Home / Self Care | Payer: PPO | Source: Ambulatory Visit | Attending: Cardiology | Admitting: Cardiology

## 2020-09-02 ENCOUNTER — Ambulatory Visit (INDEPENDENT_AMBULATORY_CARE_PROVIDER_SITE_OTHER): Payer: PPO | Admitting: Orthopaedic Surgery

## 2020-09-02 ENCOUNTER — Other Ambulatory Visit: Payer: Self-pay

## 2020-09-02 VITALS — BP 134/90 | HR 75 | Ht 78.0 in | Wt 210.0 lb

## 2020-09-02 DIAGNOSIS — I4819 Other persistent atrial fibrillation: Secondary | ICD-10-CM | POA: Diagnosis present

## 2020-09-02 DIAGNOSIS — I480 Paroxysmal atrial fibrillation: Secondary | ICD-10-CM | POA: Diagnosis not present

## 2020-09-02 DIAGNOSIS — M25512 Pain in left shoulder: Secondary | ICD-10-CM

## 2020-09-02 DIAGNOSIS — I483 Typical atrial flutter: Secondary | ICD-10-CM | POA: Diagnosis not present

## 2020-09-02 DIAGNOSIS — M1990 Unspecified osteoarthritis, unspecified site: Secondary | ICD-10-CM | POA: Diagnosis present

## 2020-09-02 DIAGNOSIS — Z01812 Encounter for preprocedural laboratory examination: Secondary | ICD-10-CM | POA: Insufficient documentation

## 2020-09-02 DIAGNOSIS — Z8673 Personal history of transient ischemic attack (TIA), and cerebral infarction without residual deficits: Secondary | ICD-10-CM | POA: Diagnosis not present

## 2020-09-02 DIAGNOSIS — G8929 Other chronic pain: Secondary | ICD-10-CM | POA: Diagnosis not present

## 2020-09-02 DIAGNOSIS — I739 Peripheral vascular disease, unspecified: Secondary | ICD-10-CM | POA: Diagnosis present

## 2020-09-02 DIAGNOSIS — Z20822 Contact with and (suspected) exposure to covid-19: Secondary | ICD-10-CM | POA: Diagnosis present

## 2020-09-02 DIAGNOSIS — Z800 Family history of malignant neoplasm of digestive organs: Secondary | ICD-10-CM | POA: Diagnosis not present

## 2020-09-02 DIAGNOSIS — Z7982 Long term (current) use of aspirin: Secondary | ICD-10-CM | POA: Diagnosis not present

## 2020-09-02 DIAGNOSIS — F17210 Nicotine dependence, cigarettes, uncomplicated: Secondary | ICD-10-CM | POA: Diagnosis present

## 2020-09-02 DIAGNOSIS — Z006 Encounter for examination for normal comparison and control in clinical research program: Secondary | ICD-10-CM | POA: Diagnosis not present

## 2020-09-02 DIAGNOSIS — I10 Essential (primary) hypertension: Secondary | ICD-10-CM | POA: Diagnosis present

## 2020-09-02 DIAGNOSIS — K648 Other hemorrhoids: Secondary | ICD-10-CM | POA: Diagnosis not present

## 2020-09-02 DIAGNOSIS — Z79899 Other long term (current) drug therapy: Secondary | ICD-10-CM | POA: Diagnosis not present

## 2020-09-02 LAB — SARS CORONAVIRUS 2 (TAT 6-24 HRS): SARS Coronavirus 2: NEGATIVE

## 2020-09-02 MED ORDER — HYDROCODONE-ACETAMINOPHEN 7.5-325 MG PO TABS: 1.0000 | ORAL_TABLET | Freq: Four times a day (QID) | ORAL | 0 refills | Status: DC | PRN

## 2020-09-02 NOTE — Progress Notes (Signed)
[name], male DOB:[birth year], 74 y.o. UXL:[id number]  Chief Complaint  Patient presents with  . Shoulder Pain    Left     HPI  Larry Reese is a 74 y.o. male who has chronic left shoulder pain.  He is to have aortic valve surgery soon.  They discovered problem with heart in workup for hip surgery.  He has no numbness of the left hand.   Body mass index is 24.27 kg/m.  ROS  Review of Systems  Constitutional: Positive for activity change.  HENT: Negative for congestion.   Respiratory: Positive for cough. Negative for shortness of breath.   Cardiovascular: Negative for chest pain.  Endocrine: Positive for cold intolerance.  Musculoskeletal: Positive for arthralgias, back pain, myalgias and neck pain.  Allergic/Immunologic: Negative for environmental allergies.  All other systems reviewed and are negative.   All other systems reviewed and are negative.  The following is a summary of the past history medically, past history surgically, known current medicines, social history and family history.  This information is gathered electronically by the computer from prior information and documentation.  I review this each visit and have found including this information at this point in the chart is beneficial and informative.    Past Medical History:  Diagnosis Date  . Anemia   . Arthritis   . Dupuytren's contracture of right hand   . Hx of adenomatous colonic polyps   . Hypertension   . Pneumothorax   . Stroke [facility]) 2005   left sided weakness  . Trigger finger     Past Surgical History:  Procedure Laterality Date  . COLONOSCOPY  May 2010   Dr. fields: 5 cm terminal ileum normal, 6 polyps removed, moderate internal hemorrhoids, simple adenomas, next colonoscopy May 2015  . COLONOSCOPY N/A 08/01/2015   Procedure: COLONOSCOPY;  Surgeon: Danie Binder, MD;  [facility];  Service: Endoscopy;  Laterality: N/A;  0830  . CRANIOTOMY  2000    pressure  . INGUINAL HERNIA REPAIR Right   . OLECRANON BURSECTOMY Right 02/11/2015   Procedure: EXCISION RIGHT OLECRANON BURSA;  Surgeon: Sanjuana Kava, MD;  Location: [facility];  Service: Orthopedics;  Laterality: Right;  . repair of trigger finger Left    pinky    Family History  Problem Relation Age of Onset  . Esophageal cancer Father   . Colon cancer Neg Hx     Social History Social History   Tobacco Use  . Smoking status: Current Every Day Smoker    Packs/day: 0.25    Years: 50.00    Pack years: 12.50    Types: Cigarettes  . Smokeless tobacco: Never Used  . Tobacco comment: 5-6 cigarettes per day  Vaping Use  . Vaping Use: Never used  Substance Use Topics  . Alcohol use: Yes    Alcohol/week: 5.0 - 6.0 standard drinks    Types: 5 - 6 Cans of beer per week    Comment: 3-4 beers/day  . Drug use: No    Allergies  Allergen Reactions  . Aleve [Naproxen] Other (See Comments)    Numbness in extremities   . Celecoxib Other (See Comments)    Celebrex. Nose bleed.  . Fluoxetine Hcl Swelling    Current Outpatient Medications  Medication Sig Dispense Refill  . ALPRAZolam (XANAX) 0.5 MG tablet Take 0.5 mg by mouth at bedtime as needed for sleep. Reported on 02/19/2016    . amLODipine (NORVASC) 10 MG tablet Take 10 mg  by mouth daily.    Marland Kitchen apixaban (ELIQUIS) 5 MG TABS tablet Take 1 tablet (5 mg total) by mouth 2 (two) times daily. 60 tablet 3  . aspirin EC 81 MG tablet Take 81 mg by mouth daily.    . diclofenac sodium (VOLTAREN) 1 % GEL Apply 2 g topically 4 (four) times daily. 100 g 0  . tamsulosin (FLOMAX) 0.4 MG CAPS capsule Take 0.4 mg by mouth daily.     . vitamin B-12 (CYANOCOBALAMIN) 1000 MCG tablet Take 1,000 mcg by mouth daily.    . vitamin E 180 MG (400 UNITS) capsule Take 400 Units by mouth daily.    Marland Kitchen HYDROcodone-acetaminophen (NORCO) 7.5-325 MG tablet Take 1 tablet by mouth every 6 (six) hours as needed for moderate pain. Must last 30 days 60 tablet 0   Current  Facility-Administered Medications  Medication Dose Route Frequency Provider Last Rate Last Admin  . lidocaine (XYLOCAINE) 1 % (with pres) injection 8 mL  8 mL Infiltration Once Hilts, Michael, MD      . methylPREDNISolone acetate (DEPO-MEDROL) injection 40 mg  40 mg Intra-articular Once Hilts, Michael, MD         Physical Exam  Blood pressure 134/90, pulse 75, height 6\' 6"  (1.981 m), weight 210 lb (95.3 kg).  Constitutional: overall normal hygiene, normal nutrition, well developed, normal grooming, normal body habitus. Assistive device:none  Musculoskeletal: gait and station Limp none, muscle tone and strength are normal, no tremors or atrophy is present.  .  Neurological: coordination overall normal.  Deep tendon reflex/nerve stretch intact.  Sensation normal.  Cranial nerves II-XII intact.   Skin:   Normal overall no scars, lesions, ulcers or rashes. No psoriasis.  Psychiatric: Alert and oriented x 3.  Recent memory intact, remote memory unclear.  Normal mood and affect. Well groomed.  Good eye contact.  Cardiovascular: overall no swelling, no varicosities, no edema bilaterally, normal temperatures of the legs and arms, no clubbing, cyanosis and good capillary refill.  Lymphatic: palpation is normal.  Left shoulder limited motion, abduction 10, forward 135.  All other systems reviewed and are negative   The patient has been educated about the nature of the problem(s) and counseled on treatment options.  The patient appeared to understand what I have discussed and is in agreement with it.  Encounter Diagnosis  Name Primary?  . Chronic left shoulder pain Yes    PLAN Call if any problems.  Precautions discussed.  Continue current medications.   Return to clinic 3 months   .[email or web address] Electronically Signed Sanjuana Kava, MD 12/14/202110:53 AM

## 2020-09-02 NOTE — Telephone Encounter (Signed)
Call attempted this morning to review and discuss Watchman implant on 12/16. I will try again later today.  Ambrose Pancoast, RN

## 2020-09-03 ENCOUNTER — Telehealth: Payer: Self-pay | Admitting: Nurse Practitioner

## 2020-09-03 NOTE — Telephone Encounter (Signed)
Unable to contact patient will attempt at a later time.

## 2020-09-04 ENCOUNTER — Inpatient Hospital Stay (HOSPITAL_COMMUNITY)
Admission: RE | Admit: 2020-09-04 | Discharge: 2020-09-05 | DRG: 274 | Disposition: A | Discharge status: Home / Self Care | Payer: PPO | Attending: Cardiology | Admitting: Cardiology

## 2020-09-04 ENCOUNTER — Encounter (HOSPITAL_COMMUNITY): Payer: Self-pay | Admitting: Cardiology

## 2020-09-04 ENCOUNTER — Other Ambulatory Visit: Payer: Self-pay

## 2020-09-04 ENCOUNTER — Inpatient Hospital Stay (HOSPITAL_COMMUNITY): Payer: PPO | Admitting: Anesthesiology

## 2020-09-04 ENCOUNTER — Encounter (HOSPITAL_COMMUNITY)
Admission: RE | Disposition: A | Discharge status: Home / Self Care | Payer: Self-pay | Source: Home / Self Care | Attending: Cardiology

## 2020-09-04 ENCOUNTER — Inpatient Hospital Stay (HOSPITAL_COMMUNITY)
Admission: RE | Admit: 2020-09-04 | Discharge: 2020-09-04 | Disposition: A | Discharge status: Home / Self Care | Payer: PPO | Source: Ambulatory Visit | Attending: Cardiology | Admitting: Cardiology

## 2020-09-04 DIAGNOSIS — Z800 Family history of malignant neoplasm of digestive organs: Secondary | ICD-10-CM

## 2020-09-04 DIAGNOSIS — I10 Essential (primary) hypertension: Secondary | ICD-10-CM | POA: Diagnosis present

## 2020-09-04 DIAGNOSIS — Z79899 Other long term (current) drug therapy: Secondary | ICD-10-CM

## 2020-09-04 DIAGNOSIS — Z006 Encounter for examination for normal comparison and control in clinical research program: Secondary | ICD-10-CM | POA: Diagnosis not present

## 2020-09-04 DIAGNOSIS — Z7982 Long term (current) use of aspirin: Secondary | ICD-10-CM | POA: Diagnosis not present

## 2020-09-04 DIAGNOSIS — M1990 Unspecified osteoarthritis, unspecified site: Secondary | ICD-10-CM | POA: Diagnosis present

## 2020-09-04 DIAGNOSIS — Z8673 Personal history of transient ischemic attack (TIA), and cerebral infarction without residual deficits: Secondary | ICD-10-CM

## 2020-09-04 DIAGNOSIS — F17210 Nicotine dependence, cigarettes, uncomplicated: Secondary | ICD-10-CM | POA: Diagnosis present

## 2020-09-04 DIAGNOSIS — I4819 Other persistent atrial fibrillation: Secondary | ICD-10-CM

## 2020-09-04 DIAGNOSIS — I739 Peripheral vascular disease, unspecified: Secondary | ICD-10-CM | POA: Diagnosis not present

## 2020-09-04 DIAGNOSIS — M87051 Idiopathic aseptic necrosis of right femur: Secondary | ICD-10-CM

## 2020-09-04 DIAGNOSIS — I480 Paroxysmal atrial fibrillation: Secondary | ICD-10-CM | POA: Diagnosis not present

## 2020-09-04 DIAGNOSIS — Z20822 Contact with and (suspected) exposure to covid-19: Secondary | ICD-10-CM | POA: Diagnosis present

## 2020-09-04 HISTORY — PX: LEFT ATRIAL APPENDAGE OCCLUSION: EP1229

## 2020-09-04 HISTORY — PX: TEE WITHOUT CARDIOVERSION: SHX5443

## 2020-09-04 LAB — SURGICAL PCR SCREEN
MRSA, PCR: NEGATIVE
Staphylococcus aureus: NEGATIVE

## 2020-09-04 LAB — TYPE AND SCREEN
ABO/RH(D): O POS
Antibody Screen: NEGATIVE

## 2020-09-04 LAB — POCT ACTIVATED CLOTTING TIME: Activated Clotting Time: 416 seconds

## 2020-09-04 SURGERY — LEFT ATRIAL APPENDAGE OCCLUSION
Anesthesia: General

## 2020-09-04 MED ORDER — SODIUM CHLORIDE 0.45 % IV SOLN: INTRAVENOUS | Status: DC

## 2020-09-04 MED ORDER — LACTATED RINGERS IV SOLN: INTRAVENOUS | Status: DC | PRN

## 2020-09-04 MED ORDER — ALPRAZOLAM 0.5 MG PO TABS: 0.5000 mg | ORAL_TABLET | Freq: Every evening | ORAL | Status: DC | PRN

## 2020-09-04 MED ORDER — PHENYLEPHRINE 40 MCG/ML (10ML) SYRINGE FOR IV PUSH (FOR BLOOD PRESSURE SUPPORT)
PREFILLED_SYRINGE | INTRAVENOUS | Status: DC | PRN
  Administered 2020-09-04: 80 ug via INTRAVENOUS

## 2020-09-04 MED ORDER — ONDANSETRON HCL 4 MG/2ML IJ SOLN
INTRAMUSCULAR | Status: DC | PRN
  Administered 2020-09-04: 4 mg via INTRAVENOUS

## 2020-09-04 MED ORDER — ROCURONIUM BROMIDE 10 MG/ML (PF) SYRINGE
PREFILLED_SYRINGE | INTRAVENOUS | Status: DC | PRN
  Administered 2020-09-04: 70 mg via INTRAVENOUS

## 2020-09-04 MED ORDER — APIXABAN 2.5 MG PO TABS
2.5000 mg | ORAL_TABLET | Freq: Two times a day (BID) | ORAL | Status: DC
  Administered 2020-09-04 – 2020-09-05 (×2): 2.5 mg via ORAL
  Filled 2020-09-04 (×2): qty 1

## 2020-09-04 MED ORDER — HYDROCODONE-ACETAMINOPHEN 7.5-325 MG PO TABS: 1.0000 | ORAL_TABLET | Freq: Four times a day (QID) | ORAL | Status: DC | PRN

## 2020-09-04 MED ORDER — ASPIRIN EC 81 MG PO TBEC
81.0000 mg | DELAYED_RELEASE_TABLET | Freq: Every day | ORAL | Status: DC
  Administered 2020-09-05: 09:00:00 81 mg via ORAL
  Filled 2020-09-04: qty 1

## 2020-09-04 MED ORDER — LACTATED RINGERS IV SOLN: INTRAVENOUS | Status: DC

## 2020-09-04 MED ORDER — SODIUM CHLORIDE 0.9% FLUSH: 3.0000 mL | INTRAVENOUS | Status: DC | PRN

## 2020-09-04 MED ORDER — DEXAMETHASONE SODIUM PHOSPHATE 10 MG/ML IJ SOLN
INTRAMUSCULAR | Status: DC | PRN
  Administered 2020-09-04: 10 mg via INTRAVENOUS

## 2020-09-04 MED ORDER — PROTAMINE SULFATE 10 MG/ML IV SOLN
INTRAVENOUS | Status: DC | PRN
  Administered 2020-09-04: 5 mg via INTRAVENOUS
  Administered 2020-09-04 (×2): 10 mg via INTRAVENOUS

## 2020-09-04 MED ORDER — SODIUM CHLORIDE 0.9% FLUSH
3.0000 mL | Freq: Two times a day (BID) | INTRAVENOUS | Status: DC
  Administered 2020-09-04 – 2020-09-05 (×2): 3 mL via INTRAVENOUS

## 2020-09-04 MED ORDER — SODIUM CHLORIDE 0.9 % IV SOLN: 250.0000 mL | INTRAVENOUS | Status: DC | PRN

## 2020-09-04 MED ORDER — ONDANSETRON HCL 4 MG/2ML IJ SOLN: 4.0000 mg | Freq: Four times a day (QID) | INTRAMUSCULAR | Status: DC | PRN

## 2020-09-04 MED ORDER — PHENYLEPHRINE HCL-NACL 10-0.9 MG/250ML-% IV SOLN
INTRAVENOUS | Status: DC | PRN
  Administered 2020-09-04: 120 ug/min via INTRAVENOUS

## 2020-09-04 MED ORDER — HEPARIN (PORCINE) IN NACL 1000-0.9 UT/500ML-% IV SOLN
INTRAVENOUS | Status: DC | PRN
  Administered 2020-09-04 (×2): 500 mL

## 2020-09-04 MED ORDER — FENTANYL CITRATE (PF) 250 MCG/5ML IJ SOLN
INTRAMUSCULAR | Status: DC | PRN
  Administered 2020-09-04: 50 ug via INTRAVENOUS

## 2020-09-04 MED ORDER — SODIUM CHLORIDE 0.9 % IV SOLN: INTRAVENOUS | Status: DC

## 2020-09-04 MED ORDER — LIDOCAINE 2% (20 MG/ML) 5 ML SYRINGE
INTRAMUSCULAR | Status: DC | PRN
  Administered 2020-09-04: 80 mg via INTRAVENOUS

## 2020-09-04 MED ORDER — CEFAZOLIN SODIUM-DEXTROSE 2-4 GM/100ML-% IV SOLN
2.0000 g | INTRAVENOUS | Status: AC
  Administered 2020-09-04: 15:00:00 2 g via INTRAVENOUS

## 2020-09-04 MED ORDER — SUGAMMADEX SODIUM 200 MG/2ML IV SOLN
INTRAVENOUS | Status: DC | PRN
  Administered 2020-09-04: 200 mg via INTRAVENOUS

## 2020-09-04 MED ORDER — TAMSULOSIN HCL 0.4 MG PO CAPS
0.4000 mg | ORAL_CAPSULE | Freq: Every day | ORAL | Status: DC
  Administered 2020-09-05: 09:00:00 0.4 mg via ORAL
  Filled 2020-09-04: qty 1

## 2020-09-04 MED ORDER — SODIUM CHLORIDE 0.9 % IV SOLN: INTRAVENOUS | Status: AC

## 2020-09-04 MED ORDER — HEPARIN SODIUM (PORCINE) 1000 UNIT/ML IJ SOLN
INTRAMUSCULAR | Status: DC | PRN
  Administered 2020-09-04: 15000 [IU] via INTRAVENOUS

## 2020-09-04 MED ORDER — AMLODIPINE BESYLATE 10 MG PO TABS
10.0000 mg | ORAL_TABLET | Freq: Every day | ORAL | Status: DC
  Administered 2020-09-05: 09:00:00 10 mg via ORAL
  Filled 2020-09-04: qty 1

## 2020-09-04 MED ORDER — ACETAMINOPHEN 325 MG PO TABS: 650.0000 mg | ORAL_TABLET | ORAL | Status: DC | PRN

## 2020-09-04 MED ORDER — CEFAZOLIN SODIUM-DEXTROSE 2-4 GM/100ML-% IV SOLN
INTRAVENOUS | Status: AC
  Filled 2020-09-04: qty 100

## 2020-09-04 MED ORDER — PROPOFOL 10 MG/ML IV BOLUS
INTRAVENOUS | Status: DC | PRN
  Administered 2020-09-04: 150 mg via INTRAVENOUS

## 2020-09-04 SURGICAL SUPPLY — 18 items
CATH INFINITI 5FR ANG PIGTAIL (CATHETERS) ×3 IMPLANT
CATH STRAIGHT 5FR 65CM (CATHETERS) ×3 IMPLANT
CLOSURE PERCLOSE PROSTYLE (VASCULAR PRODUCTS) ×6 IMPLANT
ELECT DEFIB PAD ADLT CADENCE (PAD) ×3 IMPLANT
GUIDEWIRE SAFE TJ AMPLATZ EXST (WIRE) ×3 IMPLANT
KIT DILATOR VASC 18G NDL (KITS) ×3 IMPLANT
KIT VERSACROSS FXD 230 (KITS) ×3 IMPLANT
MAT PREVALON FULL STRYKER (MISCELLANEOUS) ×3 IMPLANT
PACK CARDIAC CATHETERIZATION (CUSTOM PROCEDURE TRAY) ×3 IMPLANT
SHEATH PERFORMER 16FR 30 (SHEATH) ×3 IMPLANT
SHEATH PINNACLE 8F 10CM (SHEATH) ×3 IMPLANT
SHEATH PROBE COVER 6X72 (BAG) ×3 IMPLANT
SHIELD RADPAD SCOOP 12X17 (MISCELLANEOUS) ×3 IMPLANT
WATCHMAN FLX 31 (Prosthesis & Implant Heart) ×3 IMPLANT
WATCHMAN FLX PROCEDURE DEVICE (KITS) ×3 IMPLANT
WATCHMAN PROCED TRUSEAL ACCESS (SHEATH) ×3 IMPLANT
WATCHMAN TRUSEAL DOUBLE CURVE (SHEATH) ×3 IMPLANT
WIRE HI TORQ VERSACORE-J 145CM (WIRE) ×3 IMPLANT

## 2020-09-04 NOTE — Plan of Care (Signed)
  Problem: Education: Goal: Knowledge of General Education information will improve Description: Including pain rating scale, medication(s)/side effects and non-pharmacologic comfort measures Outcome: Progressing   Problem: Clinical Measurements: Goal: Ability to maintain clinical measurements within normal limits will improve Outcome: Progressing Goal: Will remain free from infection Outcome: Progressing Goal: Diagnostic test results will improve Outcome: Progressing Goal: Respiratory complications will improve Outcome: Progressing Goal: Cardiovascular complication will be avoided Outcome: Progressing   Problem: Activity: Goal: Risk for activity intolerance will decrease Outcome: Progressing   Problem: Nutrition: Goal: Adequate nutrition will be maintained Outcome: Progressing   Problem: Coping: Goal: Level of anxiety will decrease Outcome: Progressing   Problem: Elimination: Goal: Will not experience complications related to bowel motility Outcome: Progressing Goal: Will not experience complications related to urinary retention Outcome: Progressing   Problem: Safety: Goal: Ability to remain free from injury will improve Outcome: Progressing   Problem: Skin Integrity: Goal: Risk for impaired skin integrity will decrease Outcome: Progressing   
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
## 2020-09-04 NOTE — Progress Notes (Signed)
Lt radial arterial line d/c'ed. Manual pressure held x 10 minutes; palpable left radial pulse; level 0; dressed w/gauze and tegaderm

## 2020-09-04 NOTE — Anesthesia Procedure Notes (Signed)
Procedure Name: Intubation Date/Time: 09/04/2020 2:35 PM Performed by: Griffin Dakin, CRNA Pre-anesthesia Checklist: Patient identified, Emergency Drugs available, Suction available and Patient being monitored Patient Re-evaluated:Patient Re-evaluated prior to induction Oxygen Delivery Method: Circle system utilized Preoxygenation: Pre-oxygenation with 100% oxygen Induction Type: IV induction Ventilation: Mask ventilation without difficulty and Oral airway inserted - appropriate to patient size Laryngoscope Size: Mac and 4 Grade View: Grade II Tube type: Oral Tube size: 7.0 mm Number of attempts: 1 Airway Equipment and Method: Stylet and Oral airway Placement Confirmation: ETT inserted through vocal cords under direct vision,  positive ETCO2 and breath sounds checked- equal and bilateral Secured at: 26 cm Tube secured with: Tape Dental Injury: Teeth and Oropharynx as per pre-operative assessment

## 2020-09-04 NOTE — Anesthesia Postprocedure Evaluation (Signed)
Anesthesia Post Note  Patient: Larry Reese  Procedure(s) Performed: LEFT ATRIAL APPENDAGE OCCLUSION (N/A ) TRANSESOPHAGEAL ECHOCARDIOGRAM (TEE) (N/A )     Patient location during evaluation: Cath Lab Anesthesia Type: General Level of consciousness: awake and alert and awake Pain management: pain level controlled Vital Signs Assessment: post-procedure vital signs reviewed and stable Respiratory status: spontaneous breathing, nonlabored ventilation, respiratory function stable and patient connected to nasal cannula oxygen Cardiovascular status: blood pressure returned to baseline and stable Postop Assessment: no apparent nausea or vomiting Anesthetic complications: no   No complications documented.  Last Vitals:  Vitals:   09/04/20 1720 09/04/20 1735  BP: 125/69 108/83  Pulse: (!) 48 64  Resp: 16 13  Temp:    SpO2: 100% 100%    Last Pain:  Vitals:   09/04/20 1631  TempSrc: Temporal  PainSc: 0-No pain                 Catalina Gravel

## 2020-09-04 NOTE — Transfer of Care (Signed)
Immediate Anesthesia Transfer of Care Note  Patient: Larry Reese  Procedure(s) Performed: LEFT ATRIAL APPENDAGE OCCLUSION (N/A ) TRANSESOPHAGEAL ECHOCARDIOGRAM (TEE) (N/A )  Patient Location: PACU and Cath Lab  Anesthesia Type:General  Level of Consciousness: awake, alert  and oriented  Airway & Oxygen Therapy: Patient Spontanous Breathing  Post-op Assessment: Report given to RN and Post -op Vital signs reviewed and stable  Post vital signs: Reviewed and stable  Last Vitals:  Vitals Value Taken Time  BP    Temp 37.1 C 09/04/20 1557  Pulse    Resp    SpO2      Last Pain:  Vitals:   09/04/20 1557  TempSrc: Temporal  PainSc: Asleep      Patients Stated Pain Goal: 3 (30/10/40 4591)  Complications: No complications documented.

## 2020-09-04 NOTE — Op Note (Addendum)
  [place] TEAM  Watchman [place] Procedure   Surgeon: Lars Mage, MD Co-Surgeon: Sherren Mocha, MD Anesthesia: Hoy Morn, MD Imager: Jenkins Rouge, MD   Procedure: 1) Transseptal Puncture 2) Watchman left atrial appendage occlusion 3) Perclose right femoral vein   Device Implant: 31 mm Watchman Flex Device   Background and Indication: 74 yo with persistent atrial fibrillation, prior intracranial bleeding, felt to be a poor candidate for long term anticoagulation. He is referred for the above procedure by his primary cardiologist, [name].    Procedure description: US guidance is used for right femoral venous access. [place] images are stored digitally in the patient's chart. Double Preclose is performed at 10" and 2" positions using normal technique and a after progressively dilating the femoral vein over a Versacross wire, a 16 Fr sheath is inserted. A versacore wire is used initially because of iliac vein tortuosity. Transseptal puncture is then performed after fully anticoagulating the patient with unfractionated heparin. A therapeutic ACT greater than 250 seconds is achieved. A Versacross system is used with RF energy to cross the interatrial septum under fluoroscopic and TEE guidance. Please see Dr Mardene Speak detailed report for further description of transseptal puncture and Watchman Access Sheath insertion.   Once the 14 Fr access sheath is in appropriate position in the left atrial appendage, a 31 mm Watchman Flex device is inserted and deployed, demonstrating appropriate position and compression in the left atrial appendage. The device is recaptured x 1 and redeployed. Thorough TEE imaging is performed to evaluate all PASS criteria prior to device release. After device release, the device remains in stable position with complete occlusion of the appendage. The sheath is removed from the body and the previously deployed perclose  sutures are tightened for complete hemostasis.   Conclusion: Successful implantation of a 31 mm Watchman left atrial appendage occlusion device using fluoroscopic and transesophageal echo guidance  Sherren Mocha 09/04/2020 4:07 PM

## 2020-09-04 NOTE — Anesthesia Preprocedure Evaluation (Addendum)
Anesthesia Evaluation  Patient identified by MRN, date of birth, ID band Patient awake    Reviewed: Allergy & Precautions, NPO status , Patient's Chart, lab work & pertinent test results  Airway Mallampati: II  TM Distance: >3 FB Neck ROM: Full    Dental  (+) Chipped, Upper Dentures   Pulmonary Current Smoker and Patient abstained from smoking.,    Pulmonary exam normal breath sounds clear to auscultation       Cardiovascular hypertension, Pt. on medications + Peripheral Vascular Disease  + dysrhythmias Atrial Fibrillation  Rhythm:Irregular Rate:Tachycardia     Neuro/Psych CVA    GI/Hepatic negative GI ROS, Patient received Oral Contrast Agents,(+)     substance abuse  alcohol use,   Endo/Other  negative endocrine ROS  Renal/GU negative Renal ROS     Musculoskeletal  (+) Arthritis ,   Abdominal   Peds  Hematology  (+) Blood dyscrasia (Eliquis), ,   Anesthesia Other Findings Day of surgery medications reviewed with the patient.  Reproductive/Obstetrics                           Anesthesia Physical Anesthesia Plan  ASA: III  Anesthesia Plan: General   Post-op Pain Management:    Induction: Intravenous  PONV Risk Score and Plan: 2 and Dexamethasone and Ondansetron  Airway Management Planned: Oral ETT  Additional Equipment: Arterial line  Intra-op Plan:   Post-operative Plan: Extubation in OR  Informed Consent: I have reviewed the patients History and Physical, chart, labs and discussed the procedure including the risks, benefits and alternatives for the proposed anesthesia with the patient or authorized representative who has indicated his/her understanding and acceptance.       Plan Discussed with: CRNA  Anesthesia Plan Comments: (2 large bore PIV)        Anesthesia Quick Evaluation

## 2020-09-04 NOTE — Anesthesia Procedure Notes (Addendum)
Arterial Line Insertion Start/End12/16/2021 1:57 PM, 09/04/2020 2:07 PM Performed by: Catalina Gravel, MD, Reece Agar, CRNA, CRNA  Patient location: Pre-op. Preanesthetic checklist: patient identified, IV checked, site marked, risks and benefits discussed, surgical consent, monitors and equipment checked, pre-op evaluation, timeout performed and anesthesia consent Lidocaine 1% used for infiltration Left, radial was placed Catheter size: 20 G Hand hygiene performed  and maximum sterile barriers used  Allen's test indicative of satisfactory collateral circulation Attempts: 1 Procedure performed without using ultrasound guided technique. Following insertion, dressing applied and Biopatch. Post procedure assessment: normal and unchanged  Patient tolerated the procedure well with no immediate complications.

## 2020-09-05 ENCOUNTER — Encounter (HOSPITAL_COMMUNITY): Payer: Self-pay | Admitting: Cardiology

## 2020-09-05 DIAGNOSIS — I480 Paroxysmal atrial fibrillation: Secondary | ICD-10-CM

## 2020-09-05 LAB — BASIC METABOLIC PANEL
Anion gap: 7 (ref 5–15)
BUN: 12 mg/dL (ref 8–23)
CO2: 24 mmol/L (ref 22–32)
Calcium: 8.9 mg/dL (ref 8.9–10.3)
Chloride: 105 mmol/L (ref 98–111)
Creatinine, Ser: 1.31 mg/dL — ABNORMAL HIGH (ref 0.61–1.24)
GFR, Estimated: 57 mL/min — ABNORMAL LOW (ref >60)
Glucose, Bld: 137 mg/dL — ABNORMAL HIGH (ref 70–99)
Potassium: 4.2 mmol/L (ref 3.5–5.1)
Sodium: 136 mmol/L (ref 135–145)

## 2020-09-05 MED ORDER — APIXABAN 2.5 MG PO TABS: 2.5000 mg | ORAL_TABLET | Freq: Two times a day (BID) | ORAL | 6 refills | Status: DC

## 2020-09-05 MED ORDER — ACETAMINOPHEN 325 MG PO TABS
650.0000 mg | ORAL_TABLET | ORAL | Status: DC | PRN
Start: 2020-09-05 — End: 2021-05-21

## 2020-09-05 NOTE — Plan of Care (Signed)

## 2020-09-05 NOTE — Discharge Summary (Signed)
ELECTROPHYSIOLOGY PROCEDURE DISCHARGE SUMMARY    Patient ID: Larry Reese,  MRN: [id number], DOB/AGE: 10/04/1945 74 y.o.  Admit date: 09/04/2020 Discharge date: 09/05/2020  Primary Care Physician: [name], MD  Primary Cardiologist: Buford Dresser, MD  Electrophysiologist: Vickie Epley, MD  Primary Discharge Diagnosis:  Persistent Atrial Fibrillation Poor candidacy for long term anticoagulation due to h/o remote subdural hematoma  Procedures This Admission:  1. Transeptal Puncture 2. Intra-procedural TEE which showed no LAA thrombus 3. Left atrial appendage occlusive device placement on 09/04/2020 by Dr. Quentin Ore.  This study demonstrated: 1.Successful implantation of a WATCHMAN left atrial appendage occlusive device   2. TEE demonstrating no LAA thrombus 3. No early apparent complications.  4. Post procedural anticoagulation strategy will be Apixaban 2.5mg  PO BID and Aspirin 81mg  PO daily.   Brief HPI: Larry Reese is a 74 y.o. male with a history of Persistent Atrial Fibrillation. The patient was seen as an outpatient and thought to be a poor candidate for continued anticoagulation due to h/o remote subdural hematoma. Risks, benefits, and alternatives to left atrial appendage occlusive device placement device were reviewed with the patient who wished to proceed.  The patient underwent intra-procedural TEE as above.   Hospital Course:  The patient was admitted and underwent Left Atrial appendage occlusive device placement with details as outlined above.  They were monitored on telemetry overnight which demonstrated NSR.  Groin was without complication on the day of discharge.  The patient was examined and considered to be stable for discharge.  Wound care and restrictions were reviewed with the patient.  The patient will be seen back by Adline Peals, PA approximately 1 month to set up a TEE at approximately 6 weeks post procedure to ensure proper seal  of the device. They will follow up with Dr. Quentin Ore in 12 weeks for post ablation follow up.   CHA2DS2VASC of at least 2.    The patient understands the importance of continuing on their Eliquis 2.5 mg BID and a 81 mg ASA for 45 days. If they are on coumadin, they will have close follow up for INR check as below. They understand that after 45 days, they will then take 81 mg ASA and Plavix for a total of 6 months post procedure subject to change per MD discretion.   Physical Exam: Vitals:   09/05/20 0000 09/05/20 0318 09/05/20 0400 09/05/20 0800  BP: 113/83 129/69 128/82 114/68  Pulse: 70 61 64 77  Resp: 15 17 13 15   Temp:  98 F (36.7 C)  98.4 F (36.9 C)  TempSrc:  Oral  Oral  SpO2: 100% 95%  100%  Weight:      Height:        GEN- The patient is well appearing, alert and oriented x 3 today.   HEENT: normocephalic, atraumatic; sclera clear, conjunctiva pink; hearing intact; oropharynx clear; neck supple  Lungs- Clear to ausculation bilaterally, normal work of breathing.  No wheezes, rales, rhonchi Heart- Regular rate and rhythm, no murmurs, rubs or gallops  GI- soft, non-tender, non-distended, bowel sounds present  Extremities- no clubbing, cyanosis, or edema; DP/PT/radial pulses 2+ bilaterally, groin without hematoma/bruit MS- no significant deformity or atrophy Skin- warm and dry, no rash or lesion Psych- euthymic mood, full affect Neuro- strength and sensation are intact   Labs:   Lab Results  Component Value Date   WBC 6.3 08/20/2020   HGB 14.4 08/20/2020   HCT 43.2 08/20/2020   MCV 96.9 08/20/2020  PLT 274 08/20/2020    Recent Labs  Lab 09/05/20 0030  NA 136  K 4.2  CL 105  CO2 24  BUN 12  CREATININE 1.31*  CALCIUM 8.9  GLUCOSE 137*     Discharge Medications:  Allergies as of 09/05/2020      Reactions   Aleve [naproxen] Other (See Comments)   Numbness in extremities   Celecoxib Other (See Comments)   Celebrex. Nose bleed.   Fluoxetine Hcl  Swelling      Medication List    TAKE these medications   acetaminophen 325 MG tablet Commonly known as: TYLENOL Take 2 tablets (650 mg total) by mouth every 4 (four) hours as needed for headache or mild pain.   ALPRAZolam 0.5 MG tablet Commonly known as: XANAX Take 0.5 mg by mouth at bedtime as needed for sleep. Reported on 02/19/2016   amLODipine 10 MG tablet Commonly known as: NORVASC Take 10 mg by mouth daily.   apixaban 2.5 MG Tabs tablet Commonly known as: ELIQUIS Take 1 tablet (2.5 mg total) by mouth 2 (two) times daily. What changed:   medication strength  how much to take   aspirin EC 81 MG tablet Take 81 mg by mouth daily.   diclofenac sodium 1 % Gel Commonly known as: VOLTAREN Apply 2 g topically 4 (four) times daily.   HYDROcodone-acetaminophen 7.5-325 MG tablet Commonly known as: Norco Take 1 tablet by mouth every 6 (six) hours as needed for moderate pain. Must last 30 days   tamsulosin 0.4 MG Caps capsule Commonly known as: FLOMAX Take 0.4 mg by mouth daily.   vitamin B-12 1000 MCG tablet Commonly known as: CYANOCOBALAMIN Take 1,000 mcg by mouth daily.   vitamin E 180 MG (400 UNITS) capsule Take 400 Units by mouth daily.       Disposition:    Follow-up Information    [facility] Follow up on 10/06/2020.   Specialty: Cardiology Why: Our watchman coordinator will call you in one week to set you up to see Adline Peals in January Contact information: [address] [id number] Danice G[place] [address] [phone]              Duration of Discharge Encounter: Greater than 30 minutes including physician time.  Jacalyn Lefevre, PA-C  09/05/2020 8:30 AM

## 2020-09-05 NOTE — Progress Notes (Signed)
Patient was given discharge instructions and stated understanding. 

## 2020-09-05 NOTE — Discharge Instructions (Signed)
WATCHMANT Procedure, Care After  Procedure MD: Dr. Lambert Watchman Clinical Coordinator: Ernest Dick, RN  This sheet gives you information about how to care for yourself after your procedure. Your health care provider may also give you more specific instructions. If you have problems or questions, contact your health care provider.  What can I expect after the procedure? After the procedure, it is common to have:  Bruising around your puncture site.  Tenderness around your puncture site.  Tiredness (fatigue).  Medication instructions . It is very important to continue to take your blood thinner as well as Aspirin after the Watchman procedure. Call your procedure doctor's office with question or concerns. . If you are on Coumadin (warfarin), you will have your INR checked the week after your procedure, with a goal INR of 2.0 - 3.0. . Please follow your medication instructions on your discharge summary. Only take the medications listed on your discharge paperwork.  Follow up . You will be seen in 1 week and 6 weeks after your procedure in the [facility] Clinic . You will have another TEE (Transesophageal Echocardiogram) approximately 6 weeks after your procedure mark to check your device . You will follow up the MD who performed your procedure 6 months after your procedure . The Watchman Clinical Coordinator will check in with you from time to time, including 1 and 2 years after your procedure.    Follow these instructions at home: Puncture site care   Follow instructions from your health care provider about how to take care of your puncture site. Make sure you: ? If present, leave stitches (sutures), skin glue, or adhesive strips in place.  ? If a large square bandage is present, this may be removed 24 hours after surgery.   Check your puncture site every day for signs of infection. Check for: ? Redness, swelling, or pain. ? Fluid or blood. If your puncture site starts  to bleed, lie down on your back, apply firm pressure to the area, and contact your health care provider. ? Warmth. ? Pus or a bad smell. Driving  Do not drive yourself home if you received sedation  Do not drive for at least 4 days after your procedure or however long your health care provider recommends. (Do not resume driving if you have previously been instructed not to drive for other health reasons.)  Do not spend greater than 1 hour at a time in a car for the first 3 days. Stop and take a break with a 5 minute walk at least every hour.   Do not drive or use heavy machinery while taking prescription pain medicine.  Activity  Avoid activities that take a lot of effort, including exercise, for at least 7 days after your procedure.  For the first 3 days, avoid sitting for longer than one hour at a time.   Avoid alcoholic beverages, signing paperwork, or participating in legal proceedings for 24 hours after receiving sedation  Do not lift anything that is heavier than 10 lb (4.5 kg) for one week.   No sexual activity for 1 week.   Return to your normal activities as told by your health care provider. Ask your health care provider what activities are safe for you. General instructions  Take over-the-counter and prescription medicines only as told by your health care provider.  Do not use any products that contain nicotine or tobacco, such as cigarettes and e-cigarettes. If you need help quitting, ask your health care provider.  You   may shower after 24 hours, but Do not take baths, swim, or use a hot tub for 1 week.   Do not drink alcohol for 24 hours after your procedure.  Keep all follow-up visits as told by your health care provider. This is important.  Dental Work: You will require antibiotics prior to any dental work, including cleanings, for 6 months after your Watchman implantation to help protect you from infection. After 6 months, antibiotics are no longer  required. Contact a health care provider if:  You have redness, mild swelling, or pain around your puncture site.  You have soreness in your throat or at your puncture site that does not improve after several days  You have fluid or blood coming from your puncture site that stops after applying firm pressure to the area.  Your puncture site feels warm to the touch.  You have pus or a bad smell coming from your puncture site.  You have a fever.  You have chest pain or discomfort that spreads to your neck, jaw, or arm.  You are sweating a lot.  You feel nauseous.  You have a fast or irregular heartbeat.  You have shortness of breath.  You are dizzy or light-headed and feel the need to lie down.  You have pain or numbness in the arm or leg closest to your puncture site. Get help right away if:  Your puncture site suddenly swells.  Your puncture site is bleeding and the bleeding does not stop after applying firm pressure to the area. These symptoms may represent a serious problem that is an emergency. Do not wait to see if the symptoms will go away. Get medical help right away. Call your local emergency services (911 in the [place] Do not drive yourself to the hospital. Summary  After the procedure, it is normal to have bruising and tenderness at the puncture site in your groin, neck, or forearm.  Check your puncture site every day for signs of infection.  Get help right away if your puncture site is bleeding and the bleeding does not stop after applying firm pressure to the area. This is a medical emergency.  This information is not intended to replace advice given to you by your health care provider. Make sure you discuss any questions you have with your health care provider.

## 2020-09-10 DIAGNOSIS — I739 Peripheral vascular disease, unspecified: Secondary | ICD-10-CM | POA: Diagnosis not present

## 2020-09-10 DIAGNOSIS — I10 Essential (primary) hypertension: Secondary | ICD-10-CM | POA: Diagnosis not present

## 2020-09-15 ENCOUNTER — Encounter (HOSPITAL_COMMUNITY): Payer: PPO | Admitting: Physician Assistant

## 2020-09-16 NOTE — H&P (View-Only) (Signed)
Primary Care Physician: [name], MD Primary Cardiologist: [name]  Primary Electrophysiologist: [name] Referring Physician: Dr [name] [name] is a 74 y.o. male with a history of HTN, subdural hematoma 2006, and paroxysmal atrial fibrillation who presents for follow up in the [facility]. Patient is not on anticoagulation for a CHADS2VASC score of 5. Patient seen by [name] 07/02/20 and Watchman implant was recommended. Patient had a cardiac CT 07/21/20 which showed Chicken wing morphology. He is in rate controlled atrial flutter today but denies any symptoms.   On follow up today, patient is s/p Watchman implant with [name] on 09/04/20. He reports that he feels well with no CP or groin issues. He also denies any bleeding issues.   Today, he denies symptoms of palpitations, chest pain, shortness of breath, orthopnea, PND, lower extremity edema, dizziness, presyncope, syncope, snoring, daytime somnolence, bleeding, or neurologic sequela. The patient is tolerating medications without difficulties and is otherwise without complaint today.    Atrial Fibrillation Risk Factors:  he does not have symptoms or diagnosis of sleep apnea. he does not have a history of rheumatic fever. he does have a history of alcohol use.   he has a BMI of Body mass index is 24.5 kg/m.Marland Kitchen Filed Weights   09/17/20 1014  Weight: 96.2 kg    Family History  Problem Relation Age of Onset  . Esophageal cancer Father   . Colon cancer Neg Hx      Atrial Fibrillation Management history:  Previous antiarrhythmic drugs: none Previous cardioversions: none Previous ablations: none CHADS2VASC score: 5 Anticoagulation history: Eliquis   Past Medical History:  Diagnosis Date  . Anemia   . Arthritis   . Dupuytren's contracture of right hand   . Hx of adenomatous colonic polyps   . Hypertension   . Pneumothorax   . Stroke [facility]) 2005   left  sided weakness  . Trigger finger    Past Surgical History:  Procedure Laterality Date  . COLONOSCOPY  May 2010   Dr. fields: 5 cm terminal ileum normal, 6 polyps removed, moderate internal hemorrhoids, simple adenomas, next colonoscopy May 2015  . COLONOSCOPY N/A 08/01/2015   Procedure: COLONOSCOPY;  Surgeon: Danie Binder, MD;  [facility];  Service: Endoscopy;  Laterality: N/A;  0830  . CRANIOTOMY  2000   pressure  . INGUINAL HERNIA REPAIR Right   . LEFT ATRIAL APPENDAGE OCCLUSION N/A 09/04/2020   Procedure: LEFT ATRIAL APPENDAGE OCCLUSION;  Surgeon: Vickie Epley, MD;  Location: [place] CV LAB;  Service: Cardiovascular;  Laterality: N/A;  . OLECRANON BURSECTOMY Right 02/11/2015   Procedure: EXCISION RIGHT OLECRANON BURSA;  Surgeon: Sanjuana Kava, MD;  Location: [facility] ORS;  Service: Orthopedics;  Laterality: Right;  . repair of trigger finger Left    pinky  . TEE WITHOUT CARDIOVERSION N/A 09/04/2020   Procedure: TRANSESOPHAGEAL ECHOCARDIOGRAM (TEE);  Surgeon: Vickie Epley, MD;  Location: [place] CV LAB;  Service: Cardiovascular;  Laterality: N/A;    Current Outpatient Medications  Medication Sig Dispense Refill  . acetaminophen (TYLENOL) 325 MG tablet Take 2 tablets (650 mg total) by mouth every 4 (four) hours as needed for headache or mild pain.    Marland Kitchen ALPRAZolam (XANAX) 0.5 MG tablet Take 0.5 mg by mouth at bedtime as needed for sleep. Reported on 02/19/2016    . amLODipine (NORVASC) 10 MG tablet Take 10 mg by mouth daily.    Marland Kitchen apixaban (  ELIQUIS) 2.5 MG TABS tablet Take 1 tablet (2.5 mg total) by mouth 2 (two) times daily. 60 tablet 6  . aspirin EC 81 MG tablet Take 81 mg by mouth daily.    . diclofenac sodium (VOLTAREN) 1 % GEL Apply 2 g topically 4 (four) times daily. 100 g 0  . HYDROcodone-acetaminophen (NORCO) 7.5-325 MG tablet Take 1 tablet by mouth every 6 (six) hours as needed for moderate pain. Must last 30 days 60 tablet 0  . tamsulosin (FLOMAX) 0.4  MG CAPS capsule Take 0.4 mg by mouth daily.     . vitamin B-12 (CYANOCOBALAMIN) 1000 MCG tablet Take 1,000 mcg by mouth daily.    . vitamin E 180 MG (400 UNITS) capsule Take 400 Units by mouth daily.     Current Facility-Administered Medications  Medication Dose Route Frequency Provider Last Rate Last Admin  . lidocaine (XYLOCAINE) 1 % (with pres) injection 8 mL  8 mL Infiltration Once Hilts, Michael, MD      . methylPREDNISolone acetate (DEPO-MEDROL) injection 40 mg  40 mg Intra-articular Once Hilts, Michael, MD        Allergies  Allergen Reactions  . Aleve [Naproxen] Other (See Comments)    Numbness in extremities   . Celecoxib Other (See Comments)    Celebrex. Nose bleed.  . Fluoxetine Hcl Swelling    Social History   Socioeconomic History  . Marital status: Married    Spouse name: Not on file  . Number of children: 3  . Years of education: Not on file  . Highest education level: Not on file  Occupational History  . Occupation: retired  Tobacco Use  . Smoking status: Current Every Day Smoker    Packs/day: 0.25    Years: 50.00    Pack years: 12.50    Types: Cigarettes  . Smokeless tobacco: Never Used  . Tobacco comment: 5-6 cigarettes per day  Vaping Use  . Vaping Use: Never used  Substance and Sexual Activity  . Alcohol use: Yes    Alcohol/week: 5.0 - 6.0 standard drinks    Types: 5 - 6 Cans of beer per week    Comment: 3-4 beers/day  . Drug use: No  . Sexual activity: Yes    Birth control/protection: None  Other Topics Concern  . Not on file  Social History Narrative  . Not on file   Social Determinants of Health   Financial Resource Strain: Not on file  Food Insecurity: Not on file  Transportation Needs: Not on file  Physical Activity: Not on file  Stress: Not on file  Social Connections: Not on file  Intimate Partner Violence: Not on file     ROS- All systems are reviewed and negative except as per the HPI above.  Physical Exam: Vitals:    09/17/20 1014  BP: 124/80  Pulse: 61  Weight: 96.2 kg  Height: 6\' 6"  (1.981 m)    GEN- The patient is well appearing, alert and oriented x 3 today.   HEENT-head normocephalic, atraumatic, sclera clear, conjunctiva pink, hearing intact, trachea midline. Lungs- Clear to ausculation bilaterally, normal work of breathing Heart- irregular rate and rhythm, no murmurs, rubs or gallops  GI- soft, NT, ND, + BS Extremities- no clubbing, cyanosis, or edema MS- no significant deformity or atrophy Skin- no rash or lesion Psych- euthymic mood, full affect Neuro- strength and sensation are intact   Wt Readings from Last 3 Encounters:  09/17/20 96.2 kg  09/04/20 95.3 kg  09/02/20 95.3 kg  EKG today demonstrates coarse afib HR 61, QRS 74, QTc 388  Echo 08/04/20 demonstrated  1. Left ventricular ejection fraction, by estimation, is 60 to 65%. The  left ventricle has normal function. The left ventricle has no regional  wall motion abnormalities. Left ventricular diastolic function could not  be evaluated.  2. Right ventricular systolic function is normal. The right ventricular  size is normal. There is normal pulmonary artery systolic pressure. The  estimated right ventricular systolic pressure is 123XX123 mmHg.  3. Left atrial size was mildly dilated.  4. The mitral valve is normal in structure. Mild mitral valve  regurgitation. No evidence of mitral stenosis.  5. The aortic valve is normal in structure. Aortic valve regurgitation is  not visualized. No aortic stenosis is present.  6. Aortic dilatation noted. There is mild dilatation of the aortic root,  measuring 40 mm. There is moderate to severe dilatation of the ascending  aorta, measuring 50 mm.  7. The inferior vena cava is normal in size with greater than 50%  respiratory variability, suggesting right atrial pressure of 3 mmHg.  Epic records are reviewed at length today  CHA2DS2-VASc Score = 5  The patient's score is based  upon: CHF History: No HTN History: Yes Diabetes History: No Stroke History: Yes Vascular Disease History: Yes Age Score: 1 Gender Score: 0      ASSESSMENT AND PLAN: 1. Paroxysmal Atrial Fibrillation/atrial flutter The patient's CHA2DS2-VASc score is 5, indicating a 7.2% annual risk of stroke. Patient in rate controlled afib, asymptomatic.  S/p Watchman implant with [name] 09/04/20 Continue Eliquis 2.5 mg BID and ASA 81 mg daily for now. Post Watchman TEE scheduled for 10/14/19. Will plan to change to ASA and Plavix at that time.  Continue Lopressor 50 mg BID  2. Secondary Hypercoagulable State (ICD10:  D68.69) The patient is at significant risk for stroke/thromboembolism based upon his CHA2DS2-VASc Score of 5.  S/p Watchman, plans as above.  3. HTN Stable, no changes today.   Follow up in the AF clinic post TEE.    [facility] [address] [place], [place] [address] [phone] 09/17/2020 10:38 AM

## 2020-09-16 NOTE — Progress Notes (Signed)
Primary Care Physician: [name], MD Primary Cardiologist: [name]  Primary Electrophysiologist: [name] Referring Physician: Dr [name] [name] is a 74 y.o. male with a history of HTN, subdural hematoma 2006, and paroxysmal atrial fibrillation who presents for follow up in the [facility]. Patient is not on anticoagulation for a CHADS2VASC score of 5. Patient seen by [name] 07/02/20 and Watchman implant was recommended. Patient had a cardiac CT 07/21/20 which showed Chicken wing morphology. He is in rate controlled atrial flutter today but denies any symptoms.   On follow up today, patient is s/p Watchman implant with [name] on 09/04/20. He reports that he feels well with no CP or groin issues. He also denies any bleeding issues.   Today, he denies symptoms of palpitations, chest pain, shortness of breath, orthopnea, PND, lower extremity edema, dizziness, presyncope, syncope, snoring, daytime somnolence, bleeding, or neurologic sequela. The patient is tolerating medications without difficulties and is otherwise without complaint today.    Atrial Fibrillation Risk Factors:  he does not have symptoms or diagnosis of sleep apnea. he does not have a history of rheumatic fever. he does have a history of alcohol use.   he has a BMI of Body mass index is 24.5 kg/m.Marland Kitchen Filed Weights   09/17/20 1014  Weight: 96.2 kg    Family History  Problem Relation Age of Onset   Esophageal cancer Father    Colon cancer Neg Hx      Atrial Fibrillation Management history:  Previous antiarrhythmic drugs: none Previous cardioversions: none Previous ablations: none CHADS2VASC score: 5 Anticoagulation history: Eliquis   Past Medical History:  Diagnosis Date   Anemia    Arthritis    Dupuytren's contracture of right hand    Hx of adenomatous colonic polyps    Hypertension    Pneumothorax    Stroke [facility]) 2005   left  sided weakness   Trigger finger    Past Surgical History:  Procedure Laterality Date   COLONOSCOPY  May 2010   Dr. fields: 5 cm terminal ileum normal, 6 polyps removed, moderate internal hemorrhoids, simple adenomas, next colonoscopy May 2015   COLONOSCOPY N/A 08/01/2015   Procedure: COLONOSCOPY;  Surgeon: Danie Binder, MD;  [facility];  Service: Endoscopy;  Laterality: N/A;  0830   CRANIOTOMY  2000   pressure   INGUINAL HERNIA REPAIR Right    LEFT ATRIAL APPENDAGE OCCLUSION N/A 09/04/2020   Procedure: LEFT ATRIAL APPENDAGE OCCLUSION;  Surgeon: Vickie Epley, MD;  Location: [place] CV LAB;  Service: Cardiovascular;  Laterality: N/A;   OLECRANON BURSECTOMY Right 02/11/2015   Procedure: EXCISION RIGHT OLECRANON BURSA;  Surgeon: Sanjuana Kava, MD;  Location: [facility]S;  Service: Orthopedics;  Laterality: Right;   repair of trigger finger Left    pinky   TEE WITHOUT CARDIOVERSION N/A 09/04/2020   Procedure: TRANSESOPHAGEAL ECHOCARDIOGRAM (TEE);  Surgeon: Vickie Epley, MD;  Location: [place] CV LAB;  Service: Cardiovascular;  Laterality: N/A;    Current Outpatient Medications  Medication Sig Dispense Refill   acetaminophen (TYLENOL) 325 MG tablet Take 2 tablets (650 mg total) by mouth every 4 (four) hours as needed for headache or mild pain.     ALPRAZolam (XANAX) 0.5 MG tablet Take 0.5 mg by mouth at bedtime as needed for sleep. Reported on 02/19/2016     amLODipine (NORVASC) 10 MG tablet Take 10 mg by mouth daily.     apixaban (  ELIQUIS) 2.5 MG TABS tablet Take 1 tablet (2.5 mg total) by mouth 2 (two) times daily. 60 tablet 6   aspirin EC 81 MG tablet Take 81 mg by mouth daily.     diclofenac sodium (VOLTAREN) 1 % GEL Apply 2 g topically 4 (four) times daily. 100 g 0   HYDROcodone-acetaminophen (NORCO) 7.5-325 MG tablet Take 1 tablet by mouth every 6 (six) hours as needed for moderate pain. Must last 30 days 60 tablet 0   tamsulosin (FLOMAX) 0.4  MG CAPS capsule Take 0.4 mg by mouth daily.      vitamin B-12 (CYANOCOBALAMIN) 1000 MCG tablet Take 1,000 mcg by mouth daily.     vitamin E 180 MG (400 UNITS) capsule Take 400 Units by mouth daily.     Current Facility-Administered Medications  Medication Dose Route Frequency Provider Last Rate Last Admin   lidocaine (XYLOCAINE) 1 % (with pres) injection 8 mL  8 mL Infiltration Once Hilts, Michael, MD       methylPREDNISolone acetate (DEPO-MEDROL) injection 40 mg  40 mg Intra-articular Once Hilts, Michael, MD        Allergies  Allergen Reactions   Aleve [Naproxen] Other (See Comments)    Numbness in extremities    Celecoxib Other (See Comments)    Celebrex. Nose bleed.   Fluoxetine Hcl Swelling    Social History   Socioeconomic History   Marital status: Married    Spouse name: Not on file   Number of children: 3   Years of education: Not on file   Highest education level: Not on file  Occupational History   Occupation: retired  Tobacco Use   Smoking status: Current Every Day Smoker    Packs/day: 0.25    Years: 50.00    Pack years: 12.50    Types: Cigarettes   Smokeless tobacco: Never Used   Tobacco comment: 5-6 cigarettes per day  Vaping Use   Vaping Use: Never used  Substance and Sexual Activity   Alcohol use: Yes    Alcohol/week: 5.0 - 6.0 standard drinks    Types: 5 - 6 Cans of beer per week    Comment: 3-4 beers/day   Drug use: No   Sexual activity: Yes    Birth control/protection: None  Other Topics Concern   Not on file  Social History Narrative   Not on file   Social Determinants of Health   Financial Resource Strain: Not on file  Food Insecurity: Not on file  Transportation Needs: Not on file  Physical Activity: Not on file  Stress: Not on file  Social Connections: Not on file  Intimate Partner Violence: Not on file     ROS- All systems are reviewed and negative except as per the HPI above.  Physical Exam: Vitals:    09/17/20 1014  BP: 124/80  Pulse: 61  Weight: 96.2 kg  Height: 6\' 6"  (1.981 m)    GEN- The patient is well appearing, alert and oriented x 3 today.   HEENT-head normocephalic, atraumatic, sclera clear, conjunctiva pink, hearing intact, trachea midline. Lungs- Clear to ausculation bilaterally, normal work of breathing Heart- irregular rate and rhythm, no murmurs, rubs or gallops  GI- soft, NT, ND, + BS Extremities- no clubbing, cyanosis, or edema MS- no significant deformity or atrophy Skin- no rash or lesion Psych- euthymic mood, full affect Neuro- strength and sensation are intact   Wt Readings from Last 3 Encounters:  09/17/20 96.2 kg  09/04/20 95.3 kg  09/02/20 95.3 kg  EKG today demonstrates coarse afib HR 61, QRS 74, QTc 388  Echo 08/04/20 demonstrated  1. Left ventricular ejection fraction, by estimation, is 60 to 65%. The  left ventricle has normal function. The left ventricle has no regional  wall motion abnormalities. Left ventricular diastolic function could not  be evaluated.  2. Right ventricular systolic function is normal. The right ventricular  size is normal. There is normal pulmonary artery systolic pressure. The  estimated right ventricular systolic pressure is 123XX123 mmHg.  3. Left atrial size was mildly dilated.  4. The mitral valve is normal in structure. Mild mitral valve  regurgitation. No evidence of mitral stenosis.  5. The aortic valve is normal in structure. Aortic valve regurgitation is  not visualized. No aortic stenosis is present.  6. Aortic dilatation noted. There is mild dilatation of the aortic root,  measuring 40 mm. There is moderate to severe dilatation of the ascending  aorta, measuring 50 mm.  7. The inferior vena cava is normal in size with greater than 50%  respiratory variability, suggesting right atrial pressure of 3 mmHg.  Epic records are reviewed at length today  CHA2DS2-VASc Score = 5  The patient's score is based  upon: CHF History: No HTN History: Yes Diabetes History: No Stroke History: Yes Vascular Disease History: Yes Age Score: 1 Gender Score: 0      ASSESSMENT AND PLAN: 1. Paroxysmal Atrial Fibrillation/atrial flutter The patient's CHA2DS2-VASc score is 5, indicating a 7.2% annual risk of stroke. Patient in rate controlled afib, asymptomatic.  S/p Watchman implant with [name] 09/04/20 Continue Eliquis 2.5 mg BID and ASA 81 mg daily for now. Post Watchman TEE scheduled for 10/14/19. Will plan to change to ASA and Plavix at that time.  Continue Lopressor 50 mg BID  2. Secondary Hypercoagulable State (ICD10:  D68.69) The patient is at significant risk for stroke/thromboembolism based upon his CHA2DS2-VASc Score of 5.  S/p Watchman, plans as above.  3. HTN Stable, no changes today.   Follow up in the AF clinic post TEE.    [facility] [address] [place], [place] [address] [phone] 09/17/2020 10:38 AM

## 2020-09-17 ENCOUNTER — Other Ambulatory Visit: Payer: Self-pay

## 2020-09-17 ENCOUNTER — Encounter (HOSPITAL_COMMUNITY): Payer: Self-pay | Admitting: Physician Assistant

## 2020-09-17 ENCOUNTER — Ambulatory Visit (HOSPITAL_COMMUNITY)
Admission: RE | Admit: 2020-09-17 | Discharge: 2020-09-17 | Disposition: A | Discharge status: Home / Self Care | Payer: PPO | Source: Ambulatory Visit | Attending: Physician Assistant | Admitting: Physician Assistant

## 2020-09-17 VITALS — BP 124/80 | HR 61 | Ht 78.0 in | Wt 212.0 lb

## 2020-09-17 DIAGNOSIS — I10 Essential (primary) hypertension: Secondary | ICD-10-CM | POA: Diagnosis not present

## 2020-09-17 DIAGNOSIS — I4892 Unspecified atrial flutter: Secondary | ICD-10-CM | POA: Insufficient documentation

## 2020-09-17 DIAGNOSIS — F17210 Nicotine dependence, cigarettes, uncomplicated: Secondary | ICD-10-CM | POA: Diagnosis not present

## 2020-09-17 DIAGNOSIS — Z79899 Other long term (current) drug therapy: Secondary | ICD-10-CM | POA: Diagnosis not present

## 2020-09-17 DIAGNOSIS — Z7901 Long term (current) use of anticoagulants: Secondary | ICD-10-CM | POA: Diagnosis not present

## 2020-09-17 DIAGNOSIS — Z7982 Long term (current) use of aspirin: Secondary | ICD-10-CM | POA: Insufficient documentation

## 2020-09-17 DIAGNOSIS — I480 Paroxysmal atrial fibrillation: Secondary | ICD-10-CM | POA: Diagnosis not present

## 2020-09-17 DIAGNOSIS — D6869 Other thrombophilia: Secondary | ICD-10-CM | POA: Diagnosis not present

## 2020-09-17 LAB — CBC
HCT: 38.2 % — ABNORMAL LOW (ref 39.0–52.0)
Hemoglobin: 12.7 g/dL — ABNORMAL LOW (ref 13.0–17.0)
MCH: 31.5 pg (ref 26.0–34.0)
MCHC: 33.2 g/dL (ref 30.0–36.0)
MCV: 94.8 fL (ref 80.0–100.0)
Platelets: 364 10*3/uL (ref 150–400)
RBC: 4.03 MIL/uL — ABNORMAL LOW (ref 4.22–5.81)
RDW: 12.1 % (ref 11.5–15.5)
WBC: 6.5 10*3/uL (ref 4.0–10.5)
nRBC: 0 % (ref 0.0–0.2)

## 2020-09-17 LAB — BASIC METABOLIC PANEL
Anion gap: 9 (ref 5–15)
BUN: 10 mg/dL (ref 8–23)
CO2: 19 mmol/L — ABNORMAL LOW (ref 22–32)
Calcium: 9.1 mg/dL (ref 8.9–10.3)
Chloride: 105 mmol/L (ref 98–111)
Creatinine, Ser: 1.18 mg/dL (ref 0.61–1.24)
GFR, Estimated: >60 mL/min (ref >60)
Glucose, Bld: 110 mg/dL — ABNORMAL HIGH (ref 70–99)
Potassium: 4.2 mmol/L (ref 3.5–5.1)
Sodium: 133 mmol/L — ABNORMAL LOW (ref 135–145)

## 2020-09-17 NOTE — Patient Instructions (Addendum)
TEE scheduled for Monday January 24th  - Arrive at the [facility] and go to admitting at 10am  - Do not eat or drink anything after midnight the night prior to your procedure.  - Take all your morning medication (except diabetic medications) with a sip of water prior to arrival.  - You will not be able to drive home after your procedure.  - Do NOT miss any doses of your blood thinner - if you should miss a dose please notify our office immediately.

## 2020-10-06 ENCOUNTER — Encounter (HOSPITAL_COMMUNITY): Payer: PPO | Admitting: Physician Assistant

## 2020-10-09 ENCOUNTER — Telehealth: Payer: Self-pay | Admitting: Orthopaedic Surgery

## 2020-10-09 NOTE — Telephone Encounter (Signed)
Rx request 

## 2020-10-10 ENCOUNTER — Other Ambulatory Visit (HOSPITAL_COMMUNITY)
Admission: RE | Admit: 2020-10-10 | Discharge: 2020-10-10 | Disposition: A | Discharge status: Home / Self Care | Payer: Medicare HMO | Source: Ambulatory Visit | Attending: Cardiovascular Disease | Admitting: Cardiovascular Disease

## 2020-10-10 DIAGNOSIS — Z20822 Contact with and (suspected) exposure to covid-19: Secondary | ICD-10-CM | POA: Insufficient documentation

## 2020-10-10 DIAGNOSIS — Z01812 Encounter for preprocedural laboratory examination: Secondary | ICD-10-CM | POA: Diagnosis not present

## 2020-10-10 LAB — SARS CORONAVIRUS 2 (TAT 6-24 HRS): SARS Coronavirus 2: NEGATIVE

## 2020-10-11 DIAGNOSIS — I10 Essential (primary) hypertension: Secondary | ICD-10-CM | POA: Diagnosis not present

## 2020-10-11 DIAGNOSIS — F17200 Nicotine dependence, unspecified, uncomplicated: Secondary | ICD-10-CM | POA: Diagnosis not present

## 2020-10-13 ENCOUNTER — Other Ambulatory Visit: Payer: Self-pay

## 2020-10-13 ENCOUNTER — Ambulatory Visit (HOSPITAL_COMMUNITY): Payer: Medicare HMO | Admitting: Certified Registered"

## 2020-10-13 ENCOUNTER — Encounter (HOSPITAL_COMMUNITY)
Admission: RE | Disposition: A | Discharge status: Home / Self Care | Payer: Medicare HMO | Source: Home / Self Care | Attending: Cardiovascular Disease

## 2020-10-13 ENCOUNTER — Ambulatory Visit (HOSPITAL_COMMUNITY)
Admission: RE | Admit: 2020-10-13 | Discharge: 2020-10-13 | Disposition: A | Discharge status: Home / Self Care | Payer: Medicare HMO | Attending: Cardiovascular Disease | Admitting: Cardiovascular Disease

## 2020-10-13 ENCOUNTER — Encounter (HOSPITAL_COMMUNITY): Payer: Self-pay | Admitting: Cardiovascular Disease

## 2020-10-13 ENCOUNTER — Ambulatory Visit (HOSPITAL_BASED_OUTPATIENT_CLINIC_OR_DEPARTMENT_OTHER)
Admission: RE | Admit: 2020-10-13 | Discharge: 2020-10-13 | Disposition: A | Discharge status: Home / Self Care | Payer: Medicare HMO | Source: Ambulatory Visit | Attending: Cardiology | Admitting: Cardiology

## 2020-10-13 DIAGNOSIS — Z7982 Long term (current) use of aspirin: Secondary | ICD-10-CM | POA: Diagnosis not present

## 2020-10-13 DIAGNOSIS — F17210 Nicotine dependence, cigarettes, uncomplicated: Secondary | ICD-10-CM | POA: Insufficient documentation

## 2020-10-13 DIAGNOSIS — D6869 Other thrombophilia: Secondary | ICD-10-CM | POA: Diagnosis not present

## 2020-10-13 DIAGNOSIS — Z7901 Long term (current) use of anticoagulants: Secondary | ICD-10-CM | POA: Insufficient documentation

## 2020-10-13 DIAGNOSIS — I480 Paroxysmal atrial fibrillation: Secondary | ICD-10-CM | POA: Diagnosis not present

## 2020-10-13 DIAGNOSIS — Z79899 Other long term (current) drug therapy: Secondary | ICD-10-CM | POA: Diagnosis not present

## 2020-10-13 DIAGNOSIS — I340 Nonrheumatic mitral (valve) insufficiency: Secondary | ICD-10-CM | POA: Diagnosis not present

## 2020-10-13 DIAGNOSIS — I10 Essential (primary) hypertension: Secondary | ICD-10-CM | POA: Diagnosis not present

## 2020-10-13 DIAGNOSIS — I4892 Unspecified atrial flutter: Secondary | ICD-10-CM | POA: Insufficient documentation

## 2020-10-13 DIAGNOSIS — I081 Rheumatic disorders of both mitral and tricuspid valves: Secondary | ICD-10-CM | POA: Diagnosis not present

## 2020-10-13 DIAGNOSIS — I739 Peripheral vascular disease, unspecified: Secondary | ICD-10-CM | POA: Diagnosis not present

## 2020-10-13 HISTORY — PX: TEE WITHOUT CARDIOVERSION: SHX5443

## 2020-10-13 SURGERY — ECHOCARDIOGRAM, TRANSESOPHAGEAL
Anesthesia: Monitor Anesthesia Care

## 2020-10-13 MED ORDER — PROPOFOL 10 MG/ML IV BOLUS
INTRAVENOUS | Status: DC | PRN
  Administered 2020-10-13: 20 mg via INTRAVENOUS
  Administered 2020-10-13: 25 mg via INTRAVENOUS
  Administered 2020-10-13: 15 mg via INTRAVENOUS
  Administered 2020-10-13: 25 mg via INTRAVENOUS

## 2020-10-13 MED ORDER — FENTANYL CITRATE (PF) 100 MCG/2ML IJ SOLN
INTRAMUSCULAR | Status: DC | PRN
  Administered 2020-10-13 (×2): 25 ug via INTRAVENOUS

## 2020-10-13 MED ORDER — PROPOFOL 500 MG/50ML IV EMUL
INTRAVENOUS | Status: DC | PRN
  Administered 2020-10-13: 100 ug/kg/min via INTRAVENOUS

## 2020-10-13 MED ORDER — LIDOCAINE 2% (20 MG/ML) 5 ML SYRINGE
INTRAMUSCULAR | Status: DC | PRN
  Administered 2020-10-13: 80 mg via INTRAVENOUS

## 2020-10-13 MED ORDER — BUTAMBEN-TETRACAINE-BENZOCAINE 2-2-14 % EX AERO
INHALATION_SPRAY | CUTANEOUS | Status: DC | PRN
  Administered 2020-10-13: 2 via TOPICAL

## 2020-10-13 MED ORDER — LACTATED RINGERS IV SOLN
INTRAVENOUS | Status: DC | PRN
Start: 2020-10-13 — End: 2020-10-13

## 2020-10-13 MED ORDER — PHENYLEPHRINE 40 MCG/ML (10ML) SYRINGE FOR IV PUSH (FOR BLOOD PRESSURE SUPPORT)
PREFILLED_SYRINGE | INTRAVENOUS | Status: DC | PRN
  Administered 2020-10-13 (×2): 120 ug via INTRAVENOUS

## 2020-10-13 MED ORDER — SODIUM CHLORIDE 0.9 % IV SOLN: INTRAVENOUS | Status: DC

## 2020-10-13 NOTE — Interval H&P Note (Signed)
History and Physical Interval Note:  10/13/2020 10:25 AM  Larry Reese  has presented today for surgery, with the diagnosis of POST WATCHMAN IMPLANT.  The various methods of treatment have been discussed with the patient and family. After consideration of risks, benefits and other options for treatment, the patient has consented to  Procedure(s): TRANSESOPHAGEAL ECHOCARDIOGRAM (TEE) (N/A) as a surgical intervention.  The patient's history has been reviewed, patient examined, no change in status, stable for surgery.  I have reviewed the patient's chart and labs.  Questions were answered to the patient's satisfaction.    NPO for TEE. S/p watchman device.   [place] Bells T. Audie Box, MD, [place]  [address] [place], [place] [address] [phone]  10:25 AM

## 2020-10-13 NOTE — Progress Notes (Signed)
  Echocardiogram Echocardiogram Transesophageal has been performed.  Larry Reese 10/13/2020, 12:58 PM

## 2020-10-13 NOTE — Transfer of Care (Signed)
Immediate Anesthesia Transfer of Care Note  Patient: Larry Reese  Procedure(s) Performed: TRANSESOPHAGEAL ECHOCARDIOGRAM (TEE) (N/A )  Patient Location: Endoscopy Unit  Anesthesia Type:MAC  Level of Consciousness: awake and alert   Airway & Oxygen Therapy: Patient Spontanous Breathing  Post-op Assessment: Report given to RN and Post -op Vital signs reviewed and stable  Post vital signs: Reviewed and stable  Last Vitals:  Vitals Value Taken Time  BP 104/67 10/13/20 1121  Temp    Pulse 64 10/13/20 1122  Resp 14 10/13/20 1122  SpO2 93 % 10/13/20 1122  Vitals shown include unvalidated device data.  Last Pain:  Vitals:   10/13/20 1024  TempSrc: Temporal  PainSc: 0-No pain         Complications: No complications documented.

## 2020-10-13 NOTE — Discharge Instructions (Signed)
Transesophageal Echocardiogram Transesophageal echocardiogram (TEE) is a test that uses sound waves to take pictures of your heart. TEE is done by passing a small probe attached to a flexible tube down the part of the body that moves food from your mouth to your stomach (esophagus). The pictures give clear images of your heart. This can help your doctor see if there are problems with your heart. Tell a doctor about:  Any allergies you have.  All medicines you are taking. This includes vitamins, herbs, eye drops, creams, and over-the-counter medicines.  Any problems you or family members have had with anesthetic medicines.  Any blood disorders you have.  Any surgeries you have had.  Any medical conditions you have.  Any swallowing problems.  Whether you have or have had a blockage in the part of the body that moves food from your mouth to your stomach.  Whether you are pregnant or may be pregnant. What are the risks? In general, this is a safe procedure. But, problems may occur, such as:  Damage to nearby structures or organs.  A tear in the part of the body that moves food from your mouth to your stomach.  Irregular heartbeat.  Hoarse voice or trouble swallowing.  Bleeding. What happens before the procedure? Medicines  Ask your doctor about changing or stopping: ? Your normal medicines. ? Vitamins, herbs, and supplements. ? Over-the-counter medicines.  Do not take aspirin or ibuprofen unless you are told to. General instructions  Follow instructions from your doctor about what you cannot eat or drink.  You will take out any dentures or dental retainers.  Plan to have a responsible adult take you home from the hospital or clinic.  Plan to have a responsible adult care for you for the time you are told after you leave the hospital or clinic. This is important. What happens during the procedure?  An IV will be put into one of your veins.  You may be given: ? A  sedative. This medicine helps you relax. ? A medicine to numb the back of your throat. This may be sprayed or gargled.  Your blood pressure, heart rate, and breathing will be watched.  You may be asked to lie on your left side.  A bite block will be placed in your mouth. This keeps you from biting the tube.  The tip of the probe will be placed into the back of your mouth.  You will be asked to swallow.  Your doctor will take pictures of your heart.  The probe and bite block will be taken out after the test is done. The procedure may vary among doctors and hospitals.   What can I expect after the procedure?  You will be monitored until you leave the hospital or clinic. This includes checking your blood pressure, heart rate, breathing rate, and blood oxygen level.  Your throat may feel sore and numb. This will get better over time. You will not be allowed to eat or drink until the numbness has gone away.  It is common to have a sore throat for a day or two.  It is up to you to get the results of your procedure. Ask how to get your results when they are ready. Follow these instructions at home:  If you were given a sedative during your procedure, do not drive or use machines until your doctor says that it is safe.  Return to your normal activities when your doctor says that it is safe.    Keep all follow-up visits. Summary  TEE is a test that uses sound waves to take pictures of your heart.  You will be given a medicine to help you relax.  Do not drive or use machines until your doctor says that it is safe 24 hours This information is not intended to replace advice given to you by your health care provider. Make sure you discuss any questions you have with your health care provider. Document Revised: 04/29/2020 Document Reviewed: 04/29/2020 Elsevier Patient Education  2021 [employer].

## 2020-10-13 NOTE — Anesthesia Procedure Notes (Signed)
Procedure Name: MAC Date/Time: 10/13/2020 10:56 AM Performed by: Imagene Riches, CRNA Pre-anesthesia Checklist: Patient identified, Emergency Drugs available, Suction available, Patient being monitored and Timeout performed Patient Re-evaluated:Patient Re-evaluated prior to induction Oxygen Delivery Method: Ambu bag

## 2020-10-13 NOTE — Anesthesia Preprocedure Evaluation (Addendum)
Anesthesia Evaluation  Patient identified by MRN, date of birth, ID band Patient awake    Reviewed: Allergy & Precautions, NPO status , Patient's Chart, lab work & pertinent test results  Airway Mallampati: II  TM Distance: >3 FB Neck ROM: Full    Dental  (+) Dental Advisory Given, Missing, Upper Dentures   Pulmonary Current Smoker and Patient abstained from smoking.,    Pulmonary exam normal breath sounds clear to auscultation       Cardiovascular hypertension, Pt. on medications + Peripheral Vascular Disease  Normal cardiovascular exam+ dysrhythmias (s/p watchman 08/2020) Atrial Fibrillation  Rhythm:Irregular Rate:Abnormal  TEE 08/2020 1. Pre implant largest LAA landing zone measuremnt 2.6 cm. Large bi lobed  appendage with dense network of pectinate muscles. Post implant of 31 mm  device < 1/3 shoulder with negative tug test. No leak by color flow or  angiographic injection Average 26%  compression after implant.  2. Left ventricular ejection fraction, by estimation, is 65 to 70%. The  left ventricle has normal function. There is moderate left ventricular  hypertrophy.  3. Right ventricular systolic function is normal. The right ventricular  size is normal.  4. No left atrial/left atrial appendage thrombus was detected.  5. A trival pericardial effusion present prior to procedure and no change  post implant. The pericardial effusion is anterior to the right ventricle.  6. The mitral valve is normal in structure. Mild mitral valve  regurgitation.  7. The aortic valve is tricuspid. Aortic valve regurgitation is mild. No  aortic stenosis is present.  8. Aortic dilatation noted. There is moderate dilatation of the ascending  aorta.  9. Small left to right shunt post trans septal.   Neuro/Psych PSYCHIATRIC DISORDERS Anxiety CVA (left sided weakness), Residual Symptoms    GI/Hepatic negative GI ROS, (+)      substance abuse  alcohol use,   Endo/Other  negative endocrine ROS  Renal/GU negative Renal ROS  negative genitourinary   Musculoskeletal  (+) Arthritis ,   Abdominal   Peds  Hematology On eliquis   Anesthesia Other Findings   Reproductive/Obstetrics                            Anesthesia Physical Anesthesia Plan  ASA: III  Anesthesia Plan: MAC   Post-op Pain Management:    Induction: Intravenous  PONV Risk Score and Plan: 0 and Propofol infusion and Treatment may vary due to age or medical condition  Airway Management Planned: Natural Airway  Additional Equipment:   Intra-op Plan:   Post-operative Plan:   Informed Consent: I have reviewed the patients History and Physical, chart, labs and discussed the procedure including the risks, benefits and alternatives for the proposed anesthesia with the patient or authorized representative who has indicated his/her understanding and acceptance.     Dental advisory given  Plan Discussed with: CRNA  Anesthesia Plan Comments:        Anesthesia Quick Evaluation

## 2020-10-13 NOTE — CV Procedure (Signed)
    TRANSESOPHAGEAL ECHOCARDIOGRAM   NAME:  Larry Reese    MRN: [id number] DOB:  03-13-1946    ADMIT DATE: 10/13/2020  INDICATIONS: S/p watchman  PROCEDURE:   Informed consent was obtained prior to the procedure. The risks, benefits and alternatives for the procedure were discussed and the patient comprehended these risks.  Risks include, but are not limited to, cough, sore throat, vomiting, nausea, somnolence, esophageal and stomach trauma or perforation, bleeding, low blood pressure, aspiration, pneumonia, infection, trauma to the teeth and death.    Procedural time out performed. The oropharynx was anesthetized with topical 1% benzocaine.    Anesthesia was administered by Dr. Gifford Shave.  The patient was administered a total of 280 mg propofol and 80 mg lidocaine to maintain moderate conscious sedation.  The patient's heart rate, blood pressure, and oxygen saturation are monitored continuously during the procedure. The period of conscious sedation is 19 minutes, of which I was present face-to-face 100% of this time.   The transesophageal probe was inserted in the esophagus and stomach without difficulty and multiple views were obtained.   COMPLICATIONS:    There were no immediate complications.  KEY FINDINGS:  1. 31 mm Watchman FLX in LAA without DRT or leak. 2. Normal LVEF ~60%.  3. Full report to follow. 4. Further management per primary team.   Addison Naegeli. Audie Box, [place]  [address], [place], [place] [address] [phone]  11:16 AM

## 2020-10-14 NOTE — Anesthesia Postprocedure Evaluation (Signed)
Anesthesia Post Note  Patient: Larry Reese  Procedure(s) Performed: TRANSESOPHAGEAL ECHOCARDIOGRAM (TEE) (N/A )     Patient location during evaluation: Endoscopy Anesthesia Type: MAC Level of consciousness: awake and alert Pain management: pain level controlled Vital Signs Assessment: post-procedure vital signs reviewed and stable Respiratory status: spontaneous breathing, nonlabored ventilation, respiratory function stable and patient connected to nasal cannula oxygen Cardiovascular status: stable and blood pressure returned to baseline Postop Assessment: no apparent nausea or vomiting Anesthetic complications: no   No complications documented.  Last Vitals:  Vitals:   10/13/20 1131 10/13/20 1141  BP: 103/67 114/73  Pulse: 64 65  Resp: 18 13  Temp:    SpO2: 95% 95%    Last Pain:  Vitals:   10/13/20 1141  TempSrc:   PainSc: 0-No pain                 Catalina Gravel

## 2020-10-15 ENCOUNTER — Other Ambulatory Visit: Payer: Self-pay

## 2020-10-15 ENCOUNTER — Ambulatory Visit (HOSPITAL_COMMUNITY)
Admission: RE | Admit: 2020-10-15 | Discharge: 2020-10-15 | Disposition: A | Discharge status: Home / Self Care | Payer: Medicare HMO | Source: Ambulatory Visit | Attending: Physician Assistant | Admitting: Physician Assistant

## 2020-10-15 ENCOUNTER — Encounter (HOSPITAL_COMMUNITY): Payer: Self-pay | Admitting: Physician Assistant

## 2020-10-15 VITALS — BP 128/78 | HR 69 | Ht 78.0 in | Wt 215.6 lb

## 2020-10-15 DIAGNOSIS — I4892 Unspecified atrial flutter: Secondary | ICD-10-CM | POA: Insufficient documentation

## 2020-10-15 DIAGNOSIS — I10 Essential (primary) hypertension: Secondary | ICD-10-CM | POA: Insufficient documentation

## 2020-10-15 DIAGNOSIS — I4819 Other persistent atrial fibrillation: Secondary | ICD-10-CM | POA: Diagnosis not present

## 2020-10-15 DIAGNOSIS — D6869 Other thrombophilia: Secondary | ICD-10-CM | POA: Insufficient documentation

## 2020-10-15 DIAGNOSIS — F17210 Nicotine dependence, cigarettes, uncomplicated: Secondary | ICD-10-CM | POA: Diagnosis not present

## 2020-10-15 DIAGNOSIS — Z7901 Long term (current) use of anticoagulants: Secondary | ICD-10-CM | POA: Insufficient documentation

## 2020-10-15 DIAGNOSIS — I483 Typical atrial flutter: Secondary | ICD-10-CM

## 2020-10-15 MED ORDER — CLOPIDOGREL BISULFATE 75 MG PO TABS: 75.0000 mg | ORAL_TABLET | Freq: Every day | ORAL | 3 refills | Status: DC

## 2020-10-15 NOTE — Patient Instructions (Signed)
On Sunday, January 30th -- STOP Eliquis and Start Plavix 75mg  once a day

## 2020-10-15 NOTE — Progress Notes (Signed)
Primary Care Physician: Larry Fire, MD Primary Cardiologist: Dr Larry Reese  Primary Electrophysiologist: Dr Larry Reese Referring Physician: Dr Larry Reese is a 75 y.o. male with a history of HTN, subdural hematoma 2006, and paroxysmal atrial fibrillation who presents for follow up in the [facility]. Patient is not on anticoagulation for a CHADS2VASC score of 5. Patient seen by Dr Larry Reese 07/02/20 and Watchman implant was recommended. Patient had a cardiac CT 07/21/20 which showed Chicken wing morphology. He is in rate controlled atrial flutter today but denies any symptoms.   On follow up today, patient is s/p Watchman implant with Dr Larry Reese on 09/04/20. TEE on 10/13/20 showed no device related thrombus or leak. Patient reports that he has done well since his last visit. No bleeding issues on anticoagulation presently.    Today, he denies symptoms of palpitations, chest pain, shortness of breath, orthopnea, PND, lower extremity edema, dizziness, presyncope, syncope, snoring, daytime somnolence, bleeding, or neurologic sequela. The patient is tolerating medications without difficulties and is otherwise without complaint today.    Atrial Fibrillation Risk Factors:  he does not have symptoms or diagnosis of sleep apnea. he does not have a history of rheumatic fever. he does have a history of alcohol use.   he has a BMI of Body mass index is 24.92 kg/m.Marland Kitchen Filed Weights   10/15/20 1032  Weight: 97.8 kg    Family History  Problem Relation Age of Onset  . Esophageal cancer Father   . Colon cancer Neg Hx      Atrial Fibrillation Management history:  Previous antiarrhythmic drugs: none Previous cardioversions: none Previous ablations: none CHADS2VASC score: 5 Anticoagulation history: Eliquis   Past Medical History:  Diagnosis Date  . Anemia   . Arthritis   . Dupuytren's contracture of right hand   . Hx of adenomatous colonic  polyps   . Hypertension   . Pneumothorax   . Stroke [facility]) 2005   left sided weakness  . Trigger finger    Past Surgical History:  Procedure Laterality Date  . COLONOSCOPY  May 2010   Dr. fields: 5 cm terminal ileum normal, 6 polyps removed, moderate internal hemorrhoids, simple adenomas, next colonoscopy May 2015  . COLONOSCOPY N/A 08/01/2015   Procedure: COLONOSCOPY;  Surgeon: Larry Binder, MD;  Location: [facility] SUITE;  Service: Endoscopy;  Laterality: N/A;  0830  . CRANIOTOMY  2000   pressure  . INGUINAL HERNIA REPAIR Right   . LEFT ATRIAL APPENDAGE OCCLUSION N/A 09/04/2020   Procedure: LEFT ATRIAL APPENDAGE OCCLUSION;  Surgeon: Larry Epley, MD;  Location: [facility] CV LAB;  Service: Cardiovascular;  Laterality: N/A;  . OLECRANON BURSECTOMY Right 02/11/2015   Procedure: EXCISION RIGHT OLECRANON BURSA;  Surgeon: Larry Kava, MD;  Location: [facility] ORS;  Service: Orthopedics;  Laterality: Right;  . repair of trigger finger Left    pinky  . TEE WITHOUT CARDIOVERSION N/A 09/04/2020   Procedure: TRANSESOPHAGEAL ECHOCARDIOGRAM (TEE);  Surgeon: Larry Epley, MD;  Location: [facility] CV LAB;  Service: Cardiovascular;  Laterality: N/A;    Current Outpatient Medications  Medication Sig Dispense Refill  . acetaminophen (TYLENOL) 325 MG tablet Take 2 tablets (650 mg total) by mouth every 4 (four) hours as needed for headache or mild pain.    Marland Kitchen ALPRAZolam (XANAX) 0.5 MG tablet Take 0.5 mg by mouth at bedtime as needed for sleep. Reported on 02/19/2016    . amLODipine (NORVASC) 10 MG tablet Take  10 mg by mouth daily.    Marland Kitchen aspirin EC 81 MG tablet Take 81 mg by mouth daily.    Derrill Memo ON 10/19/2020] clopidogrel (PLAVIX) 75 MG tablet Take 1 tablet (75 mg total) by mouth daily. 30 tablet 3  . diclofenac sodium (VOLTAREN) 1 % GEL Apply 2 g topically 4 (four) times daily. 100 g 0  . HYDROcodone-acetaminophen (NORCO) 7.5-325 MG tablet TAKE ONE (1) TABLET BY MOUTH EVERY 6 HOURS AS NEEDED  FOR MODERATE PAIN 60 tablet 0  . tamsulosin (FLOMAX) 0.4 MG CAPS capsule Take 0.4 mg by mouth daily.      Current Facility-Administered Medications  Medication Dose Route Frequency Provider Last Rate Last Admin  . lidocaine (XYLOCAINE) 1 % (with pres) injection 8 mL  8 mL Infiltration Once Reese, Michael, MD      . methylPREDNISolone acetate (DEPO-MEDROL) injection 40 mg  40 mg Intra-articular Once Reese, Michael, MD        Allergies  Allergen Reactions  . Aleve [Naproxen] Other (See Comments)    Numbness in extremities   . Celecoxib Other (See Comments)    Celebrex. Nose bleed.  . Fluoxetine Hcl Swelling    Social History   Socioeconomic History  . Marital status: Married    Spouse name: Not on file  . Number of children: 3  . Years of education: Not on file  . Highest education level: Not on file  Occupational History  . Occupation: retired  Tobacco Use  . Smoking status: Current Every Day Smoker    Packs/day: 0.25    Years: 50.00    Pack years: 12.50    Types: Cigarettes  . Smokeless tobacco: Never Used  . Tobacco comment: 5-6 cigarettes per day  Vaping Use  . Vaping Use: Never used  Substance and Sexual Activity  . Alcohol use: Yes    Alcohol/week: 5.0 - 6.0 standard drinks    Types: 5 - 6 Cans of beer per week    Comment: 3-4 beers/day  . Drug use: No  . Sexual activity: Yes    Birth control/protection: None  Other Topics Concern  . Not on file  Social History Narrative  . Not on file   Social Determinants of Health   Financial Resource Strain: Not on file  Food Insecurity: Not on file  Transportation Needs: Not on file  Physical Activity: Not on file  Stress: Not on file  Social Connections: Not on file  Intimate Partner Violence: Not on file     ROS- All systems are reviewed and negative except as per the HPI above.  Physical Exam: Vitals:   10/15/20 1032  BP: 128/78  Pulse: 69  Weight: 97.8 kg  Height: 6\' 6"  (1.981 m)    GEN- The  patient is well appearing, alert and oriented x 3 today.   HEENT-head normocephalic, atraumatic, sclera clear, conjunctiva pink, hearing intact, trachea midline. Lungs- Clear to ausculation bilaterally, normal work of breathing Heart- irregular rate and rhythm, no murmurs, rubs or gallops  GI- soft, NT, ND, + BS Extremities- no clubbing, cyanosis, or edema MS- no significant deformity or atrophy Skin- no rash or lesion Psych- euthymic mood, full affect Neuro- strength and sensation are intact   Wt Readings from Last 3 Encounters:  10/15/20 97.8 kg  10/13/20 99.3 kg  09/17/20 96.2 kg    EKG today demonstrates  Atrial flutter with predominantly 4:1 block Vent. rate 69 BPM QRS duration 70 ms QT/QTc 362/387 ms   Echo 08/04/20  demonstrated  1. Left ventricular ejection fraction, by estimation, is 60 to 65%. The  left ventricle has normal function. The left ventricle has no regional  wall motion abnormalities. Left ventricular diastolic function could not  be evaluated.  2. Right ventricular systolic function is normal. The right ventricular  size is normal. There is normal pulmonary artery systolic pressure. The  estimated right ventricular systolic pressure is 10.2 mmHg.  3. Left atrial size was mildly dilated.  4. The mitral valve is normal in structure. Mild mitral valve  regurgitation. No evidence of mitral stenosis.  5. The aortic valve is normal in structure. Aortic valve regurgitation is  not visualized. No aortic stenosis is present.  6. Aortic dilatation noted. There is mild dilatation of the aortic root,  measuring 40 mm. There is moderate to severe dilatation of the ascending  aorta, measuring 50 mm.  7. The inferior vena cava is normal in size with greater than 50%  respiratory variability, suggesting right atrial pressure of 3 mmHg.  Epic records are reviewed at length today  CHA2DS2-VASc Score = 5  The patient's score is based upon: CHF History: No HTN  History: Yes Diabetes History: No Stroke History: Yes Vascular Disease History: Yes Age Score: 1 Gender Score: 0      ASSESSMENT AND PLAN: 1. Persistent Atrial Fibrillation/atrial flutter The patient's CHA2DS2-VASc score is 5, indicating a 7.2% annual risk of stroke. Patient in rate controlled afib, asymptomatic. S/p Watchman implant with Dr Larry Reese 09/04/20 TEE 10/13/20 showed A 31 mm Watchman FLX is present in the LAA. Max diameter 27.9 mm (10% compression). No device related thrombus or device leak. Left atrial size was mildly dilated. No left atrial/left atrial appendage thrombus was detected. On 10/19/20 (45 days post implant) will stop Eliquis and start Plavix 75 mg daily.  Continue ASA 81 mg daily. Continue Lopressor 50 mg BID  2. Secondary Hypercoagulable State (ICD10:  D68.69) The patient is at significant risk for stroke/thromboembolism based upon his CHA2DS2-VASc Score of 5.  S/p Watchman, plans as above.  3. HTN Stable, no changes today.   Follow up with Dr Larry Reese as scheduled.     [facility] [address] [place], [place] [address] [phone] 10/15/2020 10:49 AM

## 2020-10-16 ENCOUNTER — Encounter (HOSPITAL_COMMUNITY): Payer: Self-pay | Admitting: Cardiovascular Disease

## 2020-10-17 ENCOUNTER — Telehealth: Payer: Self-pay | Admitting: Nurse Practitioner

## 2020-10-17 NOTE — Telephone Encounter (Signed)
Contacted Mr. Befort to discuss his initiation of Plavix 75 mg on 10/19/2020 and stopping Eliquis. Patient understood the medication regimen and time was spent discussing any question associated with Plavix initiation. I advise to call me with any medication related questions if needed.  Ambrose Pancoast, RN Watchman Coordinator

## 2020-11-04 DIAGNOSIS — F411 Generalized anxiety disorder: Secondary | ICD-10-CM | POA: Diagnosis not present

## 2020-11-04 DIAGNOSIS — I739 Peripheral vascular disease, unspecified: Secondary | ICD-10-CM | POA: Diagnosis not present

## 2020-11-04 DIAGNOSIS — Z0001 Encounter for general adult medical examination with abnormal findings: Secondary | ICD-10-CM | POA: Diagnosis not present

## 2020-11-04 DIAGNOSIS — Z1331 Encounter for screening for depression: Secondary | ICD-10-CM | POA: Diagnosis not present

## 2020-11-04 DIAGNOSIS — Z1389 Encounter for screening for other disorder: Secondary | ICD-10-CM | POA: Diagnosis not present

## 2020-11-04 DIAGNOSIS — I10 Essential (primary) hypertension: Secondary | ICD-10-CM | POA: Diagnosis not present

## 2020-11-06 ENCOUNTER — Telehealth: Payer: Self-pay | Admitting: Orthopaedic Surgery

## 2020-11-06 NOTE — Telephone Encounter (Signed)
Patient requests prescription for Hydrocodone/Acetaminophen 7.5-325  Mgs.  Qty  60      Sig: TAKE ONE (1) TABLET BY MOUTH EVERY 6 HOURS AS NEEDED FOR MODERATE PAIN    Patient states he uses [facility]

## 2020-11-07 MED ORDER — HYDROCODONE-ACETAMINOPHEN 7.5-325 MG PO TABS: ORAL_TABLET | ORAL | 0 refills | Status: DC

## 2020-12-02 ENCOUNTER — Ambulatory Visit: Payer: Medicare HMO | Admitting: Orthopaedic Surgery

## 2020-12-02 ENCOUNTER — Encounter: Payer: Self-pay | Admitting: Orthopaedic Surgery

## 2020-12-02 ENCOUNTER — Encounter: Payer: Self-pay | Admitting: Cardiology

## 2020-12-02 ENCOUNTER — Other Ambulatory Visit: Payer: Self-pay

## 2020-12-02 ENCOUNTER — Ambulatory Visit: Payer: Medicare HMO | Admitting: Cardiology

## 2020-12-02 VITALS — BP 134/66 | HR 72 | Ht 78.5 in | Wt 213.0 lb

## 2020-12-02 VITALS — BP 124/87 | HR 76 | Ht 78.0 in | Wt 213.0 lb

## 2020-12-02 DIAGNOSIS — I480 Paroxysmal atrial fibrillation: Secondary | ICD-10-CM

## 2020-12-02 DIAGNOSIS — I483 Typical atrial flutter: Secondary | ICD-10-CM

## 2020-12-02 DIAGNOSIS — Z8679 Personal history of other diseases of the circulatory system: Secondary | ICD-10-CM | POA: Diagnosis not present

## 2020-12-02 DIAGNOSIS — F17210 Nicotine dependence, cigarettes, uncomplicated: Secondary | ICD-10-CM | POA: Diagnosis not present

## 2020-12-02 DIAGNOSIS — G8929 Other chronic pain: Secondary | ICD-10-CM | POA: Diagnosis not present

## 2020-12-02 DIAGNOSIS — I10 Essential (primary) hypertension: Secondary | ICD-10-CM | POA: Diagnosis not present

## 2020-12-02 DIAGNOSIS — F17200 Nicotine dependence, unspecified, uncomplicated: Secondary | ICD-10-CM | POA: Diagnosis not present

## 2020-12-02 DIAGNOSIS — M25512 Pain in left shoulder: Secondary | ICD-10-CM | POA: Diagnosis not present

## 2020-12-02 MED ORDER — CLOPIDOGREL BISULFATE 75 MG PO TABS: 75.0000 mg | ORAL_TABLET | Freq: Every day | ORAL | 0 refills | Status: DC

## 2020-12-02 NOTE — Patient Instructions (Addendum)
Medication Instructions:  Your physician has recommended you make the following change in your medication:  1. RESTART your Plavix 2. Take your Plavix with your Aspirin 3. YOU can STOP your PLAVIX 03/05/21  *If you need a refill on your cardiac medications before your next appointment, please call your pharmacy*   Lab Work: None ordered   Testing/Procedures: None ordered   Follow-Up: At [facility], you and your health needs are our priority.  As part of our continuing mission to provide you with exceptional heart care, we have created designated Provider Care Teams.  These Care Teams include your primary Cardiologist (physician) and Advanced Practice Providers (APPs -  Physician Assistants and Nurse Practitioners) who all work together to provide you with the care you need, when you need it.  We recommend signing up for the patient portal called "MyChart".  Sign up information is provided on this After Visit Summary.  MyChart is used to connect with patients for Virtual Visits (Telemedicine).  Patients are able to view lab/test results, encounter notes, upcoming appointments, etc.  Non-urgent messages can be sent to your provider as well.   To learn more about what you can do with MyChart, go to [email or web address].    Your next appointment:   9 month(s)  The format for your next appointment:   In Person  Provider:   Lars Mage, MD    Thank you for choosing [facility]!!    [phone]

## 2020-12-02 NOTE — Progress Notes (Signed)
Electrophysiology Office Follow up Visit Note:    Date:  12/02/2020   ID:  Larry Reese, DOB Apr 04, 1946, MRN [id number]  PCP:  [name], [facility] Cardiologist:  Buford Dresser, MD  [facility] Electrophysiologist:  Vickie Epley, MD    Interval History:    Larry Reese is a 75 y.o. male who presents for a follow up visit.  He underwent a successful watchman implant on September 04, 2020.  He had follow-up imaging October 13, 2020.  He has been doing well since the implant.  Without bleeding issues.  Currently, he is maintained on aspirin 81 mg by mouth daily.  He tells me that he stopped taking his Plavix 1 week ago.  He was tolerating the medication before stopping it.     Past Medical History:  Diagnosis Date  . Anemia   . Arthritis   . Dupuytren's contracture of right hand   . Hx of adenomatous colonic polyps   . Hypertension   . Pneumothorax   . Stroke [facility]) 2005   left sided weakness  . Trigger finger     Past Surgical History:  Procedure Laterality Date  . COLONOSCOPY  May 2010   Dr. fields: 5 cm terminal ileum normal, 6 polyps removed, moderate internal hemorrhoids, simple adenomas, next colonoscopy May 2015  . COLONOSCOPY N/A 08/01/2015   Procedure: COLONOSCOPY;  Surgeon: Danie Binder, MD;  [facility];  Service: Endoscopy;  Laterality: N/A;  0830  . CRANIOTOMY  2000   pressure  . INGUINAL HERNIA REPAIR Right   . LEFT ATRIAL APPENDAGE OCCLUSION N/A 09/04/2020   Procedure: LEFT ATRIAL APPENDAGE OCCLUSION;  Surgeon: Vickie Epley, MD;  Location: [place] CV LAB;  Service: Cardiovascular;  Laterality: N/A;  . OLECRANON BURSECTOMY Right 02/11/2015   Procedure: EXCISION RIGHT OLECRANON BURSA;  Surgeon: Sanjuana Kava, MD;  Location: [facility];  Service: Orthopedics;  Laterality: Right;  . repair of trigger finger Left    pinky  . TEE WITHOUT CARDIOVERSION N/A 09/04/2020   Procedure: TRANSESOPHAGEAL  ECHOCARDIOGRAM (TEE);  Surgeon: Vickie Epley, MD;  Location: [place] [facility];  Service: Cardiovascular;  Laterality: N/A;  . TEE WITHOUT CARDIOVERSION N/A 10/13/2020   Procedure: TRANSESOPHAGEAL ECHOCARDIOGRAM (TEE);  Surgeon: Geralynn Rile, MD;  Location: [place];  Service: Cardiovascular;  Laterality: N/A;    Current Medications: Current Meds  Medication Sig  . acetaminophen (TYLENOL) 325 MG tablet Take 2 tablets (650 mg total) by mouth every 4 (four) hours as needed for headache or mild pain.  Marland Kitchen ALPRAZolam (XANAX) 0.5 MG tablet Take 0.5 mg by mouth at bedtime as needed for sleep. Reported on 02/19/2016  . amLODipine (NORVASC) 10 MG tablet Take 10 mg by mouth daily.  Marland Kitchen aspirin EC 81 MG tablet Take 81 mg by mouth daily.  . diclofenac sodium (VOLTAREN) 1 % GEL Apply 2 g topically 4 (four) times daily.  Marland Kitchen HYDROcodone-acetaminophen (NORCO) 7.5-325 MG tablet TAKE ONE (1) TABLET BY MOUTH EVERY 6 HOURS AS NEEDED FOR MODERATE PAIN  . tamsulosin (FLOMAX) 0.4 MG CAPS capsule Take 0.4 mg by mouth daily.   . [DISCONTINUED] clopidogrel (PLAVIX) 75 MG tablet Take 1 tablet (75 mg total) by mouth daily.   Current Facility-Administered Medications for the 12/02/20 encounter (Office Visit) with Vickie Epley, MD  Medication  . lidocaine (XYLOCAINE) 1 % (with pres) injection 8 mL  . methylPREDNISolone acetate (DEPO-MEDROL) injection 40 mg     Allergies:  Aleve [naproxen], Celecoxib, and Fluoxetine hcl   Social History   Socioeconomic History  . Marital status: Married    Spouse name: Not on file  . Number of children: 3  . Years of education: Not on file  . Highest education level: Not on file  Occupational History  . Occupation: retired  Tobacco Use  . Smoking status: Current Every Day Smoker    Packs/day: 0.25    Years: 50.00    Pack years: 12.50    Types: Cigarettes  . Smokeless tobacco: Never Used  . Tobacco comment: 5-6 cigarettes per day  Vaping Use  . Vaping  Use: Never used  Substance and Sexual Activity  . Alcohol use: Yes    Alcohol/week: 5.0 - 6.0 standard drinks    Types: 5 - 6 Cans of beer per week    Comment: 3-4 beers/day  . Drug use: No  . Sexual activity: Yes    Birth control/protection: None  Other Topics Concern  . Not on file  Social History Narrative  . Not on file   Social Determinants of Health   Financial Resource Strain: Not on file  Food Insecurity: Not on file  Transportation Needs: Not on file  Physical Activity: Not on file  Stress: Not on file  Social Connections: Not on file     Family History: The patient's family history includes Esophageal cancer in his father. There is no history of Colon cancer.  ROS:   Please see the history of present illness.    All other systems reviewed and are negative.  EKGs/Labs/Other Studies Reviewed:    The following studies were reviewed today:  October 13, 2020 TEE personally reviewed Watchman well-seated without leak.  10% compression at the device is maximal diameter.  No thrombus within the left atrium.  December 02, 2020 EKG personally reviewed shows atrial fibrillation with a ventricular rate of 72 bpm.  Recent Labs: 05/01/2020: ALT 19 09/17/2020: BUN 10; Creatinine, Ser 1.18; Hemoglobin 12.7; Platelets 364; Potassium 4.2; Sodium 133  Recent Lipid Panel No results found for: CHOL, TRIG, HDL, CHOLHDL, VLDL, LDLCALC, LDLDIRECT  Physical Exam:    VS:  BP 134/66   Pulse 72   Ht 6' 6.5" (1.994 m)   Wt 213 lb (96.6 kg)   BMI 24.30 kg/m     Wt Readings from Last 3 Encounters:  12/02/20 213 lb (96.6 kg)  10/15/20 215 lb 9.6 oz (97.8 kg)  10/13/20 219 lb (99.3 kg)     GEN:  Well nourished, well developed in no acute distress HEENT: Normal NECK: No JVD; No carotid bruits LYMPHATICS: No lymphadenopathy CARDIAC: RRR, no murmurs, rubs, gallops RESPIRATORY:  Clear to auscultation without rales, wheezing or rhonchi  ABDOMEN: Soft, non-tender,  non-distended MUSCULOSKELETAL:  No edema; No deformity  SKIN: Warm and dry NEUROLOGIC:  Alert and oriented x 3 PSYCHIATRIC:  Normal affect   ASSESSMENT:    1. Paroxysmal atrial fibrillation (HCC)   2. Typical atrial flutter ([place])   3. History of subdural hematoma    PLAN:    In order of problems listed above:  1. History of paroxysmal atrial fibrillation flutter Post watchman implant on September 04, 2020.  For unclear reasons he stopped taking his Plavix 1 week ago.  He has been taking his aspirin 81 mg by mouth once daily.  No bleeding problems.  I would like him to restart his Plavix until he is 6 months post implant.  This will be until March 05, 2021.  At that time, he will drop down to aspirin 81 mg by mouth once daily monotherapy.    2.  History of subdural hematoma Plan to continue Plavix and aspirin until June 16.  At that time, patient can drop down to aspirin 81 mg by mouth once daily.       Medication Adjustments/Labs and Tests Ordered: Current medicines are reviewed at length with the patient today.  Concerns regarding medicines are outlined above.  No orders of the defined types were placed in this encounter.  No orders of the defined types were placed in this encounter.    Signed, Lars Mage, MD, [facility]  12/02/2020 9:16 AM    Electrophysiology [place]

## 2020-12-02 NOTE — Progress Notes (Signed)
[name], male DOB:1946/01/08, 75 y.o. WGN:[id number]  Chief Complaint  Patient presents with  . Shoulder Pain    left    HPI  Larry Reese is a 75 y.o. male who has chronic left shoulder pain.  He has good and bad days.  The cold weather recently has made it worse.  He has no weakness, no numbness, no new trauma.   Body mass index is 24.61 kg/m.  ROS  Review of Systems  Constitutional: Positive for activity change.  HENT: Negative for congestion.   Respiratory: Positive for cough. Negative for shortness of breath.   Cardiovascular: Negative for chest pain.  Endocrine: Positive for cold intolerance.  Musculoskeletal: Positive for arthralgias, back pain, myalgias and neck pain.  Allergic/Immunologic: Negative for environmental allergies.  All other systems reviewed and are negative.   All other systems reviewed and are negative.  The following is a summary of the past history medically, past history surgically, known current medicines, social history and family history.  This information is gathered electronically by the computer from prior information and documentation.  I review this each visit and have found including this information at this point in the chart is beneficial and informative.    Past Medical History:  Diagnosis Date  . Anemia   . Arthritis   . Dupuytren's contracture of right hand   . Hx of adenomatous colonic polyps   . Hypertension   . Pneumothorax   . Stroke [facility]) 2005   left sided weakness  . Trigger finger     Past Surgical History:  Procedure Laterality Date  . COLONOSCOPY  May 2010   Dr. fields: 5 cm terminal ileum normal, 6 polyps removed, moderate internal hemorrhoids, simple adenomas, next colonoscopy May 2015  . COLONOSCOPY N/A 08/01/2015   Procedure: COLONOSCOPY;  Surgeon: Danie Binder, MD;  [facility];  Service: Endoscopy;  Laterality: N/A;  0830  . CRANIOTOMY  2000   pressure  . INGUINAL  HERNIA REPAIR Right   . LEFT ATRIAL APPENDAGE OCCLUSION N/A 09/04/2020   Procedure: LEFT ATRIAL APPENDAGE OCCLUSION;  Surgeon: Vickie Epley, MD;  Location: [place] CV LAB;  Service: Cardiovascular;  Laterality: N/A;  . OLECRANON BURSECTOMY Right 02/11/2015   Procedure: EXCISION RIGHT OLECRANON BURSA;  Surgeon: Sanjuana Kava, MD;  Location: [facility];  Service: Orthopedics;  Laterality: Right;  . repair of trigger finger Left    pinky  . TEE WITHOUT CARDIOVERSION N/A 09/04/2020   Procedure: TRANSESOPHAGEAL ECHOCARDIOGRAM (TEE);  Surgeon: Vickie Epley, MD;  Location: [place] [facility] LAB;  Service: Cardiovascular;  Laterality: N/A;  . TEE WITHOUT CARDIOVERSION N/A 10/13/2020   Procedure: TRANSESOPHAGEAL ECHOCARDIOGRAM (TEE);  Surgeon: Geralynn Rile, MD;  Location: [facility] ENDOSCOPY;  Service: Cardiovascular;  Laterality: N/A;    Family History  Problem Relation Age of Onset  . Esophageal cancer Father   . Colon cancer Neg Hx     Social History Social History   Tobacco Use  . Smoking status: Current Every Day Smoker    Packs/day: 0.25    Years: 50.00    Pack years: 12.50    Types: Cigarettes  . Smokeless tobacco: Never Used  . Tobacco comment: 5-6 cigarettes per day  Vaping Use  . Vaping Use: Never used  Substance Use Topics  . Alcohol use: Yes    Alcohol/week: 5.0 - 6.0 standard drinks    Types: 5 - 6 Cans of beer per week    Comment:  3-4 beers/day  . Drug use: No    Allergies  Allergen Reactions  . Aleve [Naproxen] Other (See Comments)    Numbness in extremities   . Celecoxib Other (See Comments)    Celebrex. Nose bleed.  . Fluoxetine Hcl Swelling    Current Outpatient Medications  Medication Sig Dispense Refill  . acetaminophen (TYLENOL) 325 MG tablet Take 2 tablets (650 mg total) by mouth every 4 (four) hours as needed for headache or mild pain.    Marland Kitchen ALPRAZolam (XANAX) 0.5 MG tablet Take 0.5 mg by mouth at bedtime as needed for sleep. Reported on  02/19/2016    . amLODipine (NORVASC) 10 MG tablet Take 10 mg by mouth daily.    Marland Kitchen aspirin EC 81 MG tablet Take 81 mg by mouth daily.    . clopidogrel (PLAVIX) 75 MG tablet Take 1 tablet (75 mg total) by mouth daily. 62 tablet 0  . diclofenac sodium (VOLTAREN) 1 % GEL Apply 2 g topically 4 (four) times daily. 100 g 0  . HYDROcodone-acetaminophen (NORCO) 7.5-325 MG tablet TAKE ONE (1) TABLET BY MOUTH EVERY 6 HOURS AS NEEDED FOR MODERATE PAIN 60 tablet 0  . tamsulosin (FLOMAX) 0.4 MG CAPS capsule Take 0.4 mg by mouth daily.      Current Facility-Administered Medications  Medication Dose Route Frequency Provider Last Rate Last Admin  . lidocaine (XYLOCAINE) 1 % (with pres) injection 8 mL  8 mL Infiltration Once Hilts, Michael, MD      . methylPREDNISolone acetate (DEPO-MEDROL) injection 40 mg  40 mg Intra-articular Once Hilts, Michael, MD         Physical Exam  Blood pressure 124/87, pulse 76, height 6\' 6"  (1.981 m), weight 213 lb (96.6 kg).  Constitutional: overall normal hygiene, normal nutrition, well developed, normal grooming, normal body habitus. Assistive device:none  Musculoskeletal: gait and station Limp none, muscle tone and strength are normal, no tremors or atrophy is present.  .  Neurological: coordination overall normal.  Deep tendon reflex/nerve stretch intact.  Sensation normal.  Cranial nerves II-XII intact.   Skin:   Normal overall no scars, lesions, ulcers or rashes. No psoriasis.  Psychiatric: Alert and oriented x 3.  Recent memory intact, remote memory unclear.  Normal mood and affect. Well groomed.  Good eye contact.  Cardiovascular: overall no swelling, no varicosities, no edema bilaterally, normal temperatures of the legs and arms, no clubbing, cyanosis and good capillary refill.  Lymphatic: palpation is normal.  Left shoulder with decreased motion, forward 145, abduction 90.  All other systems reviewed and are negative   The patient has been educated about the  nature of the problem(s) and counseled on treatment options.  The patient appeared to understand what I have discussed and is in agreement with it.  Encounter Diagnoses  Name Primary?  . Chronic left shoulder pain Yes  . Cigarette nicotine dependence without complication     PLAN Call if any problems.  Precautions discussed.  Continue current medications.   Return to clinic 3 months   Electronically Signed Sanjuana Kava, MD 3/15/20224:01 PM

## 2020-12-02 NOTE — Patient Instructions (Addendum)
Steps to Quit Smoking Smoking tobacco is the leading cause of preventable death. It can affect almost every organ in the body. Smoking puts you and people around you at risk for many serious, long-lasting (chronic) diseases. Quitting smoking can be hard, but it is one of the best things that you can do for your health. It is never too late to quit. How do I get ready to quit? When you decide to quit smoking, make a plan to help you succeed. Before you quit:  Pick a date to quit. Set a date within the next 2 weeks to give you time to prepare.  Write down the reasons why you are quitting. Keep this list in places where you will see it often.  Tell your family, friends, and co-workers that you are quitting. Their support is important.  Talk with your doctor about the choices that may help you quit.  Find out if your health insurance will pay for these treatments.  Know the people, places, things, and activities that make you want to smoke (triggers). Avoid them. What first steps can I take to quit smoking?  Throw away all cigarettes at home, at work, and in your car.  Throw away the things that you use when you smoke, such as ashtrays and lighters.  Clean your car. Make sure to empty the ashtray.  Clean your home, including curtains and carpets. What can I do to help me quit smoking? Talk with your doctor about taking medicines and seeing a counselor at the same time. You are more likely to succeed when you do both.  If you are pregnant or breastfeeding, talk with your doctor about counseling or other ways to quit smoking. Do not take medicine to help you quit smoking unless your doctor tells you to do so. To quit smoking: Quit right away  Quit smoking totally, instead of slowly cutting back on how much you smoke over a period of time.  Go to counseling. You are more likely to quit if you go to counseling sessions regularly. Take medicine You may take medicines to help you quit. Some  medicines need a prescription, and some you can buy over-the-counter. Some medicines may contain a drug called nicotine to replace the nicotine in cigarettes. Medicines may:  Help you to stop having the desire to smoke (cravings).  Help to stop the problems that come when you stop smoking (withdrawal symptoms). Your doctor may ask you to use:  Nicotine patches, gum, or lozenges.  Nicotine inhalers or sprays.  Non-nicotine medicine that is taken by mouth. Find resources Find resources and other ways to help you quit smoking and remain smoke-free after you quit. These resources are most helpful when you use them often. They include:  Online chats with a counselor.  Phone quitlines.  Printed self-help materials.  Support groups or group counseling.  Text messaging programs.  Mobile phone apps. Use apps on your mobile phone or tablet that can help you stick to your quit plan. There are many free apps for mobile phones and tablets as well as websites. Examples include Quit Guide from the CDC and smokefree.gov   What things can I do to make it easier to quit?  Talk to your family and friends. Ask them to support and encourage you.  Call a phone quitline (1-800-QUIT-NOW), reach out to support groups, or work with a counselor.  Ask people who smoke to not smoke around you.  Avoid places that make you want to smoke,   such as: ? Bars. ? Parties. ? Smoke-break areas at work.  Spend time with people who do not smoke.  Lower the stress in your life. Stress can make you want to smoke. Try these things to help your stress: ? Getting regular exercise. ? Doing deep-breathing exercises. ? Doing yoga. ? Meditating. ? Doing a body scan. To do this, close your eyes, focus on one area of your body at a time from head to toe. Notice which parts of your body are tense. Try to relax the muscles in those areas.   How will I feel when I quit smoking? Day 1 to 3 weeks Within the first 24 hours,  you may start to have some problems that come from quitting tobacco. These problems are very bad 2-3 days after you quit, but they do not often last for more than 2-3 weeks. You may get these symptoms:  Mood swings.  Feeling restless, nervous, angry, or annoyed.  Trouble concentrating.  Dizziness.  Strong desire for high-sugar foods and nicotine.  Weight gain.  Trouble pooping (constipation).  Feeling like you may vomit (nausea).  Coughing or a sore throat.  Changes in how the medicines that you take for other issues work in your body.  Depression.  Trouble sleeping (insomnia). Week 3 and afterward After the first 2-3 weeks of quitting, you may start to notice more positive results, such as:  Better sense of smell and taste.  Less coughing and sore throat.  Slower heart rate.  Lower blood pressure.  Clearer skin.  Better breathing.  Fewer sick days. Quitting smoking can be hard. Do not give up if you fail the first time. Some people need to try a few times before they succeed. Do your best to stick to your quit plan, and talk with your doctor if you have any questions or concerns. Summary  Smoking tobacco is the leading cause of preventable death. Quitting smoking can be hard, but it is one of the best things that you can do for your health.  When you decide to quit smoking, make a plan to help you succeed.  Quit smoking right away, not slowly over a period of time.  When you start quitting, seek help from your doctor, family, or friends. This information is not intended to replace advice given to you by your health care provider. Make sure you discuss any questions you have with your health care provider. Document Revised: 06/01/2019 Document Reviewed: 11/25/2018 Elsevier Patient Education  Central.   Please call the office at [phone] for a medication refill before 12 pm.

## 2020-12-04 ENCOUNTER — Telehealth: Payer: Self-pay | Admitting: Orthopaedic Surgery

## 2020-12-04 NOTE — Telephone Encounter (Signed)
Rx request 

## 2020-12-04 NOTE — Telephone Encounter (Signed)
Patient states that Dr. [name] asked him to remind that he is to get his prescription for Hydrocodone/Acetaminohen 7.5-325  Mgs.  Qty  60  Sig: TAKE ONE (1) TABLET BY MOUTH EVERY 6 HOURS AS NEEDED FOR MODERATE PAIN  Patient states he uses [facility]

## 2020-12-17 ENCOUNTER — Telehealth: Payer: Self-pay | Admitting: Cardiology

## 2020-12-17 NOTE — Telephone Encounter (Signed)
3.30.22 Spoke with patient to schedule fu w/Dr.Christopher. Patient was unclear why he still had to see Dr. Harrell Gave when he is being treated by Dr. Quentin Ore. Advised I will check w/Dr. Harrell Gave and get back to him. LP

## 2021-01-02 DIAGNOSIS — I10 Essential (primary) hypertension: Secondary | ICD-10-CM | POA: Diagnosis not present

## 2021-01-02 DIAGNOSIS — F17200 Nicotine dependence, unspecified, uncomplicated: Secondary | ICD-10-CM | POA: Diagnosis not present

## 2021-01-06 ENCOUNTER — Telehealth: Payer: Self-pay | Admitting: Orthopaedic Surgery

## 2021-01-29 DIAGNOSIS — H5203 Hypermetropia, bilateral: Secondary | ICD-10-CM | POA: Diagnosis not present

## 2021-02-01 DIAGNOSIS — I4892 Unspecified atrial flutter: Secondary | ICD-10-CM | POA: Diagnosis not present

## 2021-02-01 DIAGNOSIS — I10 Essential (primary) hypertension: Secondary | ICD-10-CM | POA: Diagnosis not present

## 2021-02-02 ENCOUNTER — Other Ambulatory Visit: Payer: Self-pay | Admitting: Cardiology

## 2021-02-05 ENCOUNTER — Telehealth: Payer: Self-pay | Admitting: Orthopaedic Surgery

## 2021-02-26 ENCOUNTER — Other Ambulatory Visit: Payer: Self-pay

## 2021-02-26 ENCOUNTER — Ambulatory Visit: Payer: Medicare HMO | Admitting: Orthopaedic Surgery

## 2021-02-26 ENCOUNTER — Encounter: Payer: Self-pay | Admitting: Orthopaedic Surgery

## 2021-02-26 VITALS — BP 153/89 | HR 62 | Ht 78.5 in | Wt 205.0 lb

## 2021-02-26 DIAGNOSIS — F17210 Nicotine dependence, cigarettes, uncomplicated: Secondary | ICD-10-CM

## 2021-02-26 DIAGNOSIS — G8929 Other chronic pain: Secondary | ICD-10-CM

## 2021-02-26 DIAGNOSIS — M25512 Pain in left shoulder: Secondary | ICD-10-CM | POA: Diagnosis not present

## 2021-02-26 MED ORDER — HYDROCODONE-ACETAMINOPHEN 7.5-325 MG PO TABS: ORAL_TABLET | ORAL | 0 refills | Status: DC

## 2021-02-26 NOTE — Progress Notes (Signed)
My shoulder is more sore  He has long history of left shoulder pain.  It is more tender recently.  He has no new trauma.  He has pain trying to lift it above shoulder and more recently he has not been able to get it up that high.  He has no swelling, no redness, no numbness.  He is taking his medicine.  Examination of left Upper Extremity is done.  Inspection:   Overall:  Elbow non-tender without crepitus or defects, forearm non-tender without crepitus or defects, wrist non-tender without crepitus or defects, hand non-tender.    Shoulder: with glenohumeral joint tenderness, without effusion.   Upper arm:  without swelling and tenderness   Range of motion:   Overall:  Full range of motion of the elbow, full range of motion of wrist and full range of motion in fingers.   Shoulder:  left  100 degrees forward flexion; 75 degrees abduction; 20 degrees internal rotation, 20 degrees external rotation, 5 degrees extension, 40 degrees adduction.   Stability:   Overall:  Shoulder, elbow and wrist stable   Strength and Tone:   Overall full shoulder muscles strength, full upper arm strength and normal upper arm bulk and tone.   Encounter Diagnoses  Name Primary?   Chronic left shoulder pain Yes   Cigarette nicotine dependence without complication    I have shown him exercises to do for the shoulder using a cane or empty broom stick.  He will do them at home supine.  I will refill his pain medicine.  It will be a week early but he is set to go out of town for a vacation.  Return in three months.  I have reviewed the [place] web site prior to prescribing narcotic medicine for this patient.    Call if any problem.  Precautions discussed.  Electronically Signed Sanjuana Kava, MD 6/9/20229:13 AM

## 2021-03-03 ENCOUNTER — Ambulatory Visit: Payer: Medicare HMO | Admitting: Orthopaedic Surgery

## 2021-03-05 DIAGNOSIS — F17200 Nicotine dependence, unspecified, uncomplicated: Secondary | ICD-10-CM | POA: Diagnosis not present

## 2021-03-05 DIAGNOSIS — I10 Essential (primary) hypertension: Secondary | ICD-10-CM | POA: Diagnosis not present

## 2021-03-06 ENCOUNTER — Ambulatory Visit: Payer: Medicare HMO | Admitting: Cardiology

## 2021-03-10 ENCOUNTER — Ambulatory Visit: Payer: Medicare HMO | Admitting: Student

## 2021-03-10 ENCOUNTER — Encounter: Payer: Self-pay | Admitting: Student

## 2021-03-10 ENCOUNTER — Other Ambulatory Visit: Payer: Self-pay

## 2021-03-10 VITALS — BP 140/60 | HR 62 | Ht 78.5 in | Wt 202.0 lb

## 2021-03-10 DIAGNOSIS — I480 Paroxysmal atrial fibrillation: Secondary | ICD-10-CM

## 2021-03-10 DIAGNOSIS — Z8679 Personal history of other diseases of the circulatory system: Secondary | ICD-10-CM | POA: Diagnosis not present

## 2021-03-10 NOTE — Patient Instructions (Addendum)
Medication Instructions:  Your physician has recommended you make the following change in your medication:   STOP: Clopidogrel (Plavix)  *If you need a refill on your cardiac medications before your next appointment, please call your pharmacy*   Lab Work: None If you have labs (blood work) drawn today and your tests are completely normal, you will receive your results only by: [place] (if you have MyChart) OR A paper copy in the mail If you have any lab test that is abnormal or we need to change your treatment, we will call you to review the results.   Follow-Up: At [facility], you and your health needs are our priority.  As part of our continuing mission to provide you with exceptional heart care, we have created designated Provider Care Teams.  These Care Teams include your primary Cardiologist (physician) and Advanced Practice Providers (APPs -  Physician Assistants and Nurse Practitioners) who all work together to provide you with the care you need, when you need it.  We recommend signing up for the patient portal called "MyChart".  Sign up information is provided on this After Visit Summary.  MyChart is used to connect with patients for Virtual Visits (Telemedicine).  Patients are able to view lab/test results, encounter notes, upcoming appointments, etc.  Non-urgent messages can be sent to your provider as well.   To learn more about what you can do with MyChart, go to [email or web address].    Your next appointment:   6 month(s)  The format for your next appointment:   In Person  Provider:   You may see Vickie Epley, MD or one of the following Advanced Practice Providers on your designated Care Team:    Tommye Standard, [place] "[facility]" [place], [place]

## 2021-03-10 NOTE — Progress Notes (Signed)
PCP:  [name], MD Primary Cardiologist: Buford Dresser, MD Electrophysiologist: Vickie Epley, MD   Larry Reese is a 75 y.o. male seen today for Vickie Epley, MD for routine electrophysiology followup.  Since last being seen in our clinic the patient reports doing well. He is still on plavix. Did not understand to stop it 03/05/21.  He did have a fall about 3 weeks ago. He was leaning on a porch railing that came loose. He fell backwards into the yard without injury. He is not very active. Limited by hip pain. Wife states he is mostly sedentary doing word puzzles. he denies chest pain, palpitations, dyspnea, PND, orthopnea, nausea, vomiting, dizziness, syncope, edema, weight gain, or early satiety.  Past Medical History:  Diagnosis Date   Anemia    Arthritis    Dupuytren's contracture of right hand    Hx of adenomatous colonic polyps    Hypertension    Pneumothorax    Stroke [facility]) 2005   left sided weakness   Trigger finger    Past Surgical History:  Procedure Laterality Date   COLONOSCOPY  May 2010   Dr. fields: 5 cm terminal ileum normal, 6 polyps removed, moderate internal hemorrhoids, simple adenomas, next colonoscopy May 2015   COLONOSCOPY N/A 08/01/2015   Procedure: COLONOSCOPY;  Surgeon: Danie Binder, MD;  Location: [facility];  Service: Endoscopy;  Laterality: N/A;  0830   CRANIOTOMY  2000   pressure   INGUINAL HERNIA REPAIR Right    LEFT ATRIAL APPENDAGE OCCLUSION N/A 09/04/2020   Procedure: LEFT ATRIAL APPENDAGE OCCLUSION;  Surgeon: Vickie Epley, MD;  Location: [place] [facility];  Service: Cardiovascular;  Laterality: N/A;   OLECRANON BURSECTOMY Right 02/11/2015   Procedure: EXCISION RIGHT OLECRANON BURSA;  Surgeon: Sanjuana Kava, MD;  Location: [facility] OR[facility];  Service: Orthopedics;  Laterality: Right;   repair of trigger finger Left    pinky   TEE WITHOUT CARDIOVERSION N/A 09/04/2020   Procedure: TRANSESOPHAGEAL ECHOCARDIOGRAM (TEE);   Surgeon: Vickie Epley, MD;  Location: [place] [facility];  Service: Cardiovascular;  Laterality: N/A;   TEE WITHOUT CARDIOVERSION N/A 10/13/2020   Procedure: TRANSESOPHAGEAL ECHOCARDIOGRAM (TEE);  Surgeon: Geralynn Rile, MD;  Location: [place];  Service: Cardiovascular;  Laterality: N/A;    Current Outpatient Medications  Medication Sig Dispense Refill   acetaminophen (TYLENOL) 325 MG tablet Take 2 tablets (650 mg total) by mouth every 4 (four) hours as needed for headache or mild pain.     amLODipine (NORVASC) 10 MG tablet Take 10 mg by mouth daily.     aspirin EC 81 MG tablet Take 81 mg by mouth daily.     clopidogrel (PLAVIX) 75 MG tablet TAKE ONE (1) TABLET BY MOUTH EVERY DAY 90 tablet 3   diclofenac sodium (VOLTAREN) 1 % GEL Apply 2 g topically 4 (four) times daily. 100 g 0   HYDROcodone-acetaminophen (NORCO) 7.5-325 MG tablet One tablet every six hours as needed for pain 60 tablet 0   tamsulosin (FLOMAX) 0.4 MG CAPS capsule Take 0.4 mg by mouth daily.      Current Facility-Administered Medications  Medication Dose Route Frequency Provider Last Rate Last Admin   lidocaine (XYLOCAINE) 1 % (with pres) injection 8 mL  8 mL Infiltration Once Hilts, Nuri Branca, MD       methylPREDNISolone acetate (DEPO-MEDROL) injection 40 mg  40 mg Intra-articular Once Hilts, Legrand Como, MD        Allergies  Allergen Reactions  Aleve [Naproxen] Other (See Comments)    Numbness in extremities    Celecoxib Other (See Comments)    Celebrex. Nose bleed.   Fluoxetine Hcl Swelling    Social History   Socioeconomic History   Marital status: Married    Spouse name: Not on file   Number of children: 3   Years of education: Not on file   Highest education level: Not on file  Occupational History   Occupation: retired  Tobacco Use   Smoking status: Every Day    Packs/day: 0.25    Years: 50.00    Pack years: 12.50    Types: Cigarettes   Smokeless tobacco: Never   Tobacco comments:     5-6 cigarettes per day  Vaping Use   Vaping Use: Never used  Substance and Sexual Activity   Alcohol use: Yes    Alcohol/week: 5.0 - 6.0 standard drinks    Types: 5 - 6 Cans of beer per week    Comment: 3-4 beers/day   Drug use: No   Sexual activity: Yes    Birth control/protection: None  Other Topics Concern   Not on file  Social History Narrative   Not on file   Social Determinants of Health   Financial Resource Strain: Not on file  Food Insecurity: Not on file  Transportation Needs: Not on file  Physical Activity: Not on file  Stress: Not on file  Social Connections: Not on file  Intimate Partner Violence: Not on file     Review of Systems: All other systems reviewed and are otherwise negative except as noted above.  Physical Exam: Vitals:   03/10/21 1055  BP: 140/60  Pulse: 62  SpO2: 98%  Weight: 202 lb (91.6 kg)  Height: 6' 6.5" (1.994 m)    GEN- The patient is well appearing, alert and oriented x 3 today.   HEENT: normocephalic, atraumatic; sclera clear, conjunctiva pink; hearing intact; oropharynx clear; neck supple, no JVP Lymph- no cervical lymphadenopathy Lungs- Clear to ausculation bilaterally, normal work of breathing.  No wheezes, rales, rhonchi Heart- Regular rate and rhythm, no murmurs, rubs or gallops, PMI not laterally displaced GI- soft, non-tender, non-distended, bowel sounds present, no hepatosplenomegaly Extremities- no clubbing, cyanosis, or edema; DP/PT/radial pulses 2+ bilaterally MS- no significant deformity or atrophy Skin- warm and dry, no rash or lesion Psych- euthymic mood, full affect Neuro- strength and sensation are intact  EKG is not ordered.   Additional studies reviewed include: Previous EP office notes  Assessment and Plan:  History of paroxysmal atrial fibrillation flutter S/p watchman implant on September 04, 2020. Discussed personally with Dr. Quentin Ore. Stop plavix. Was supposed to stop 03/05/21 Continue ASA alone.     2.  History of subdural hematoma Stop Plavix today.  Continue ASA alone.   3. Fall prevention Discussed using a 4 pronged cane, removing loose objects such as rugs from common used areas, and nightlight between bed and bathroom.   Shirley Friar, PA-C  03/10/21 11:07 AM

## 2021-04-04 DIAGNOSIS — I10 Essential (primary) hypertension: Secondary | ICD-10-CM | POA: Diagnosis not present

## 2021-04-04 DIAGNOSIS — I739 Peripheral vascular disease, unspecified: Secondary | ICD-10-CM | POA: Diagnosis not present

## 2021-04-07 ENCOUNTER — Telehealth: Payer: Self-pay | Admitting: Orthopaedic Surgery

## 2021-04-07 MED ORDER — HYDROCODONE-ACETAMINOPHEN 7.5-325 MG PO TABS: ORAL_TABLET | ORAL | 0 refills | Status: DC

## 2021-04-07 NOTE — Telephone Encounter (Signed)
Patient called request refill on pain medicine   HYDROcodone-acetaminophen (NORCO) 7.5-325 MG tablet    Pharmacy [facility]

## 2021-04-27 DIAGNOSIS — I480 Paroxysmal atrial fibrillation: Secondary | ICD-10-CM | POA: Diagnosis not present

## 2021-04-27 DIAGNOSIS — M1990 Unspecified osteoarthritis, unspecified site: Secondary | ICD-10-CM | POA: Diagnosis not present

## 2021-04-27 DIAGNOSIS — I10 Essential (primary) hypertension: Secondary | ICD-10-CM | POA: Diagnosis not present

## 2021-04-27 DIAGNOSIS — N400 Enlarged prostate without lower urinary tract symptoms: Secondary | ICD-10-CM | POA: Diagnosis not present

## 2021-05-01 ENCOUNTER — Telehealth: Payer: Self-pay

## 2021-05-01 NOTE — Telephone Encounter (Signed)
Called patient to follow-up on pre-LAAO CT (07/21/2020). 1. Aortic atherosclerosis with aneurysmal dilatation of the ascending thoracic aorta (4.8 cm in diameter). Ascending thoracic aortic aneurysm. Recommend semi-annual imaging followup by CTA or MRA and referral to cardiothoracic surgery if not already obtained.  Post Watchman TEE 10/13/20: "Aorta measurements comparable to recent CT. Aneurysm of the ascending  aorta, measuring 48 mm."  Discussed with Dr. Quentin Ore - "follow up with the patient and schedule the CTA. The patient will need follow up for the results with the referring cardiologist, [name], after the repeat CTA"  Left message for the patient to call back to discuss CT/follow-up.Marland Kitchen

## 2021-05-07 ENCOUNTER — Telehealth: Payer: Self-pay | Admitting: Orthopaedic Surgery

## 2021-05-07 NOTE — Telephone Encounter (Signed)
Spoke with the patient in detail about aortic measurements. He does not wish to arrange CT at this time. Scheduled the patient for overdue follow-up with Dr. Harrell Gave 05/21/21. He understands they will discuss enlargement in further detail at that visit. He understands new office location. He was grateful for call and agrees with plan.

## 2021-05-07 NOTE — Telephone Encounter (Signed)
Patient called requested refill on pain medicine   HYDROcodone-acetaminophen (NORCO) 7.5-325 MG tablet   Pharmacy:  [facility]

## 2021-05-21 ENCOUNTER — Encounter (HOSPITAL_BASED_OUTPATIENT_CLINIC_OR_DEPARTMENT_OTHER): Payer: Self-pay | Admitting: Cardiology

## 2021-05-21 ENCOUNTER — Ambulatory Visit (HOSPITAL_BASED_OUTPATIENT_CLINIC_OR_DEPARTMENT_OTHER)
Admission: RE | Admit: 2021-05-21 | Discharge: 2021-05-21 | Disposition: A | Discharge status: Home / Self Care | Payer: Medicare HMO | Source: Ambulatory Visit | Attending: Cardiology | Admitting: Cardiology

## 2021-05-21 ENCOUNTER — Ambulatory Visit (INDEPENDENT_AMBULATORY_CARE_PROVIDER_SITE_OTHER): Payer: Medicare HMO

## 2021-05-21 ENCOUNTER — Ambulatory Visit (HOSPITAL_BASED_OUTPATIENT_CLINIC_OR_DEPARTMENT_OTHER): Payer: Medicare HMO | Admitting: Cardiology

## 2021-05-21 ENCOUNTER — Telehealth: Payer: Self-pay | Admitting: Cardiology

## 2021-05-21 ENCOUNTER — Other Ambulatory Visit: Payer: Self-pay

## 2021-05-21 VITALS — BP 108/70 | HR 54 | Ht 78.5 in | Wt 198.6 lb

## 2021-05-21 DIAGNOSIS — I10 Essential (primary) hypertension: Secondary | ICD-10-CM

## 2021-05-21 DIAGNOSIS — R0902 Hypoxemia: Secondary | ICD-10-CM | POA: Diagnosis not present

## 2021-05-21 DIAGNOSIS — E7800 Pure hypercholesterolemia, unspecified: Secondary | ICD-10-CM

## 2021-05-21 DIAGNOSIS — R0602 Shortness of breath: Secondary | ICD-10-CM | POA: Diagnosis not present

## 2021-05-21 DIAGNOSIS — I4821 Permanent atrial fibrillation: Secondary | ICD-10-CM

## 2021-05-21 DIAGNOSIS — Z8679 Personal history of other diseases of the circulatory system: Secondary | ICD-10-CM

## 2021-05-21 DIAGNOSIS — R001 Bradycardia, unspecified: Secondary | ICD-10-CM | POA: Diagnosis not present

## 2021-05-21 NOTE — Patient Instructions (Addendum)
Medication Instructions:  Your Physician recommend you continue on your current medication as directed.    *If you need a refill on your cardiac medications before your next appointment, please call your pharmacy*   Lab Work: None ordered today   Testing/Procedures: Your physician has requested that you have an echocardiogram. Echocardiography is a painless test that uses sound waves to create images of your heart. It provides your doctor with information about the size and shape of your heart and how well your heart's chambers and valves are working. This procedure takes approximately one hour. There are no restrictions for this procedure. [place] [address]  A chest x-ray takes a picture of the organs and structures inside the chest, including the heart, lungs, and blood vessels. This test can show several things, including, whether the heart is enlarges; whether fluid is building up in the lungs; and whether pacemaker / defibrillator leads are still in place. [facility]   Our physician has recommended that you wear an  7 DAY ZIO-PATCH monitor. The Zio patch cardiac monitor continuously records heart rhythm data for up to 14 days, this is for patients being evaluated for multiple types heart rhythms. For the first 24 hours post application, please avoid getting the Zio monitor wet in the shower or by excessive sweating during exercise. After that, feel free to carry on with regular activities. Keep soaps and lotions away from the ZIO XT Patch.     Follow-Up: At [facility], you and your health needs are our priority.  As part of our continuing mission to provide you with exceptional heart care, we have created designated Provider Care Teams.  These Care Teams include your primary Cardiologist (physician) and Advanced Practice Providers (APPs -  Physician Assistants and Nurse Practitioners) who all work together to provide you with the care you need, when you need it.  We  recommend signing up for the patient portal called "MyChart".  Sign up information is provided on this After Visit Summary.  MyChart is used to connect with patients for Virtual Visits (Telemedicine).  Patients are able to view lab/test results, encounter notes, upcoming appointments, etc.  Non-urgent messages can be sent to your provider as well.   To learn more about what you can do with MyChart, go to [email or web address].    Your next appointment:   1 month(s)  The format for your next appointment:   In Person  Provider:   Buford Dresser, MD  Look into finger pulse oximeter to check oxygen at home.   ZIO AT Long term monitor-Live Telemetry  Your physician has requested you wear a ZIO patch monitor for 7 days.  This is a single patch monitor. [employer] supplies one patch monitor per enrollment. Additional  stickers are not available.  Please do not apply patch if you will be having a Nuclear Stress Test, Echocardiogram, Cardiac CT, MRI,  or Chest Xray during the period you would be wearing the monitor. The patch cannot be worn during  these tests. You cannot remove and re-apply the ZIO AT patch monitor.  Your ZIO patch monitor will be mailed 3 day USPS to your address on file. It may take 3-5 days to  receive your monitor after you have been enrolled.  Once you have received your monitor, please review the enclosed instructions. Your monitor has  already been registered assigning a specific monitor serial # to you.   Billing and Patient Assistance Program information  Larry Reese has been supplied with any  insurance information on record for billing. [employer] offers a sliding scale Patient Assistance Program for patients without insurance, or whose  insurance does not completely cover the cost of the ZIO patch monitor. You must apply for the  Patient Assistance Program to qualify for the discounted rate. To apply, call [employer] at [phone],  select option 4, select option 2  , ask to apply for the Patient Assistance Program, (you can request an  interpreter if needed). [employer] will ask your household income and how many people are in your  household. [employer] will quote your out-of-pocket cost based on this information. They will also be able  to set up a 12 month interest free payment plan if needed.  Applying the monitor   Shave hair from upper left chest.  Hold the abrader disc by orange tab. Rub the abrader in 40 strokes over left upper chest as indicated in  your monitor instructions.  Clean area with 4 enclosed alcohol pads. Use all pads to ensure the area is cleaned thoroughly. Let  dry.  Apply patch as indicated in monitor instructions. Patch will be placed under collarbone on left side of  chest with arrow pointing upward.  Rub patch adhesive wings for 2 minutes. Remove the white label marked "1". Remove the white label  marked "2". Rub patch adhesive wings for 2 additional minutes.  While looking in a mirror, press and release button in center of patch. A small green light will flash 3-4  times. This will be your only indicator that the monitor has been turned on.  Do not shower for the first 24 hours. You may shower after the first 24 hours.  Press the button if you feel a symptom. You will hear a small click. Record Date, Time and Symptom in  the Patient Log.   Starting the Gateway  In your kit there is a Hydrographic surveyor box the size of a cellphone. This is Airline pilot. It transmits all your  recorded data to [facility]. This box must always stay within 10 feet of you. Open the box and push the *  button. There will be a light that blinks orange and then green a few times. When the light stops  blinking, the Gateway is connected to the ZIO patch. Call [employer] at [phone] to confirm your monitor is transmitting.  Returning your monitor  Remove your patch and place it inside the [place]. In the lower half of the Gateway there is a white  bag  with prepaid postage on it. Place Gateway in bag and seal. Mail package back to [place] as soon as  possible. Your physician should have your final report approximately 7 days after you have mailed back  your monitor. Call [place] at [phone] if you have questions regarding your ZIO AT  patch monitor. Call them immediately if you see an orange light blinking on your monitor.  If your monitor falls off in less than 4 days, contact our Monitor department at [phone]. If your  monitor becomes loose or falls off after 4 days call [employer] at [phone] for suggestions on  securing your monitor

## 2021-05-21 NOTE — Telephone Encounter (Signed)
Monitor report reviewed by Dr. Harrell Gave which revealed Afib HR 45-62 bpm.   MD recommends no further changes.

## 2021-05-21 NOTE — Telephone Encounter (Signed)
Spoke with pt, he reports he is feeling fine. Will make [name] aware and will fax this strip to [facility].

## 2021-05-21 NOTE — Progress Notes (Signed)
Cardiology Office Note:    Date:  05/21/2021   ID:  Larry Reese, DOB [birth year], MRN [id number]  PCP:  [name], MD  Cardiologist:  Buford Dresser, MD  Referring MD: [name], MD   CC: follow up  History of Present Illness:    Larry Reese is a 75 y.o. male with a hx of tobacco use, hypertension, subdural hematoma 2006 who is seen for follow up today. He was initially seen 05/07/20 as a new consult at the request of [name], MD for the evaluation and management of atrial fibrillation, preoperative evaluation.  Cardiac history: At preop visit 05/01/20, noted to be in new atrial fibrillation on ECG. Referred to cardiology for evaluation prior to hip surgery. History reported stroke in 2005, but this actually appears to be a subdural hematoma in 2006.   Today: Here with his wife today.  Has had months of coughing up white phlegm. Home covid test has been negative. Breathing has been hard/heavy even at rest. No fevers/chills/sweats. No hemoptysis. Initial O2 sats on arrival were in the upper 70s/low 80s, improved to 99% on recheck.  No LE edema, has not noted any fluid. Has adjustable bed, sleeps partially inclined. No PND. Does have nocturia 4-5 times/night.   Notes intermittent heart rate in the can be low, once saw in the 20s. Has chronic afib, on no nodal agents. Had a recent fall, but no loss of consciousness.   Still smoking, about 10-15 cigarettes/day. Has never seen a lung doctor or been told he has COPD. Sister has COPD, father had lung cancer, mother had asthma.  Past Medical History:  Diagnosis Date   Anemia    Arthritis    Dupuytren's contracture of right hand    Hx of adenomatous colonic polyps    Hypertension    Pneumothorax    Stroke [facility]) 2005   left sided weakness   Trigger finger     Past Surgical History:  Procedure Laterality Date   COLONOSCOPY  May 2010   Dr. fields: 5 cm terminal ileum normal, 6 polyps removed, moderate  internal hemorrhoids, simple adenomas, next colonoscopy May 2015   COLONOSCOPY N/A 08/01/2015   Procedure: COLONOSCOPY;  Surgeon: Danie Binder, MD;  Location: [facility];  Service: Endoscopy;  Laterality: N/A;  0830   CRANIOTOMY  2000   pressure   INGUINAL HERNIA REPAIR Right    LEFT ATRIAL APPENDAGE OCCLUSION N/A 09/04/2020   Procedure: LEFT ATRIAL APPENDAGE OCCLUSION;  Surgeon: Vickie Epley, MD;  Location: [place] CV LAB;  Service: Cardiovascular;  Laterality: N/A;   OLECRANON BURSECTOMY Right 02/11/2015   Procedure: EXCISION RIGHT OLECRANON BURSA;  Surgeon: Sanjuana Kava, MD;  Location: [facility] ORS;  Service: Orthopedics;  Laterality: Right;   repair of trigger finger Left    pinky   TEE WITHOUT CARDIOVERSION N/A 09/04/2020   Procedure: TRANSESOPHAGEAL ECHOCARDIOGRAM (TEE);  Surgeon: Vickie Epley, MD;  Location: [place] CV LAB;  Service: Cardiovascular;  Laterality: N/A;   TEE WITHOUT CARDIOVERSION N/A 10/13/2020   Procedure: TRANSESOPHAGEAL ECHOCARDIOGRAM (TEE);  Surgeon: Geralynn Rile, MD;  Location: [facility] ENDOSCOPY;  Service: Cardiovascular;  Laterality: N/A;    Current Medications: Current Outpatient Medications on File Prior to Visit  Medication Sig   amLODipine (NORVASC) 10 MG tablet Take 10 mg by mouth daily.   aspirin EC 81 MG tablet Take 81 mg by mouth daily.   diclofenac sodium (VOLTAREN) 1 % GEL Apply 2 g topically 4 (four) times  daily.   HYDROcodone-acetaminophen (NORCO) 7.5-325 MG tablet TAKE ONE TABLET BY MOUTH EVERY SIX HOURSAS NEEDED FOR PAIN MAY CAUSE DROWSINESS   tamsulosin (FLOMAX) 0.4 MG CAPS capsule Take 0.4 mg by mouth daily.    No current facility-administered medications on file prior to visit.     Allergies:   Aleve [naproxen], Celecoxib, and Fluoxetine hcl   Social History   Tobacco Use   Smoking status: Every Day    Packs/day: 0.25    Years: 50.00    Pack years: 12.50    Types: Cigarettes   Smokeless tobacco: Never    Tobacco comments:    5-6 cigarettes per day  Vaping Use   Vaping Use: Never used  Substance Use Topics   Alcohol use: Yes    Alcohol/week: 5.0 - 6.0 standard drinks    Types: 5 - 6 Cans of beer per week    Comment: 3-4 beers/day   Drug use: No    Family History: family history includes Esophageal cancer in his father. There is no history of Colon cancer.  ROS:   Please see the history of present illness.  Additional pertinent ROS otherwise unremarkable.  EKGs/Labs/Other Studies Reviewed:    The following studies were reviewed today: Echo 08/04/20 1. Left ventricular ejection fraction, by estimation, is 60 to 65%. The  left ventricle has normal function. The left ventricle has no regional  wall motion abnormalities. Left ventricular diastolic function could not  be evaluated.   2. Right ventricular systolic function is normal. The right ventricular  size is normal. There is normal pulmonary artery systolic pressure. The  estimated right ventricular systolic pressure is 75.3 mmHg.   3. Left atrial size was mildly dilated.   4. The mitral valve is normal in structure. Mild mitral valve  regurgitation. No evidence of mitral stenosis.   5. The aortic valve is normal in structure. Aortic valve regurgitation is  not visualized. No aortic stenosis is present.   6. Aortic dilatation noted. There is mild dilatation of the aortic root,  measuring 40 mm. There is moderate to severe dilatation of the ascending  aorta, measuring 50 mm.   7. The inferior vena cava is normal in size with greater than 50%  respiratory variability, suggesting right atrial pressure of 3 mmHg.   EKG:  EKG is personally reviewed.   05/21/21: atrial fibrillation with slow ventricular response at 54 bpm 05/08/20 demonstrates atrial fibrillation with slow ventricular response of 57 bpm.  Recent Labs: 09/17/2020: BUN 10; Creatinine, Ser 1.18; Hemoglobin 12.7; Platelets 364; Potassium 4.2; Sodium 133  Recent Lipid  Panel No results found for: CHOL, TRIG, HDL, CHOLHDL, VLDL, LDLCALC, LDLDIRECT  Physical Exam:    VS:  BP 108/70   Pulse (!) 54   Ht 6' 6.5" (1.994 m)   Wt 198 lb 9.6 oz (90.1 kg)   SpO2 99%   BMI 22.66 kg/m     Wt Readings from Last 3 Encounters:  05/21/21 198 lb 9.6 oz (90.1 kg)  03/10/21 202 lb (91.6 kg)  02/26/21 205 lb (93 kg)    GEN: Well nourished, well developed in no acute distress HEENT: Normal, moist mucous membranes NECK: No JVD CARDIAC: slow, irregularly irregular rhythm, normal S1 and S2, no rubs or gallops. No murmur. VASCULAR: Radial and DP pulses 2+ bilaterally. No carotid bruits RESPIRATORY:  Clear to auscultation without rales, wheezing or rhonchi  ABDOMEN: Soft, non-tender, non-distended MUSCULOSKELETAL:  Ambulates independently SKIN: Warm and dry, no edema NEUROLOGIC:  Alert  and oriented x 3. No focal neuro deficits noted. PSYCHIATRIC:  Normal affect    ASSESSMENT:    1. Exercise hypoxemia   2. Bradycardia   3. Permanent atrial fibrillation ([place])   4. Shortness of breath   5. History of subdural hematoma   6. Essential hypertension   7. Pure hypercholesterolemia     PLAN:    Shortness of breath, initial hypoxemia Bradycardia -concern that slow ventricular rates may be the etiology of his symptoms. We will get a live telemetry monitor given risk of pauses/severe bradycardia -will repeat echo to rule out structural changes -discussed home pulse ox to monitor O2 sats and heart rate -reviewed red flag warning signs that need immediate medical attention  Atrial fibrillation, permanent CHA2DS2/VAS Stroke Risk Points =5 (age, hypertension, CVAx2, PAD) initially, though now appears to have been a subdural hematoma and not a stroke, placing his CHADSVASC=3 -s/p Watchman and doing well  Hypertension: at goal of <130/80 -continue amlodipine 10 mg daily  PAD: -continue aspirin -have discussed statin, tobacco cessation. Precontemplative, continue to  address  Hypercholesterolemia: -with PAD, goal LDL <70 -per [facility], lipids 12/14/19 show Tchol 221, HDL 40, LDL 155, TG 139. Will see if he has had more recent labs done with his PCP -will continue to discuss statin. He has declined in the past   Cardiac risk counseling and prevention recommendations: -recommend heart healthy/Mediterranean diet, with whole grains, fruits, vegetable, fish, lean meats, nuts, and olive oil. Limit salt. -recommend moderate walking, 3-5 times/week for 30-50 minutes each session. Aim for at least 150 minutes.week. Goal should be pace of 3 miles/hours, or walking 1.5 miles in 30 minutes -recommend avoidance of tobacco products. Avoid excess alcohol.  Plan for follow up: one month  Buford Dresser, MD, PhD [place]  [facility]   Medication Adjustments/Labs and Tests Ordered: Current medicines are reviewed at length with the patient today.  Concerns regarding medicines are outlined above.  Orders Placed This Encounter  Procedures   DG Chest 2 View   LONG TERM MONITOR-LIVE TELEMETRY (3-14 DAYS)   EKG 12-Lead   ECHOCARDIOGRAM COMPLETE    No orders of the defined types were placed in this encounter.   Patient Instructions  Medication Instructions:  Your Physician recommend you continue on your current medication as directed.    *If you need a refill on your cardiac medications before your next appointment, please call your pharmacy*   Lab Work: None ordered today   Testing/Procedures: Your physician has requested that you have an echocardiogram. Echocardiography is a painless test that uses sound waves to create images of your heart. It provides your doctor with information about the size and shape of your heart and how well your heart's chambers and valves are working. This procedure takes approximately one hour. There are no restrictions for this procedure. [address]  A chest x-ray takes a picture of the organs and  structures inside the chest, including the heart, lungs, and blood vessels. This test can show several things, including, whether the heart is enlarges; whether fluid is building up in the lungs; and whether pacemaker / defibrillator leads are still in place. [place]   Our physician has recommended that you wear an  7 DAY ZIO-PATCH monitor. The Zio patch cardiac monitor continuously records heart rhythm data for up to 14 days, this is for patients being evaluated for multiple types heart rhythms. For the first 24 hours post application, please avoid getting the Zio monitor wet  in the shower or by excessive sweating during exercise. After that, feel free to carry on with regular activities. Keep soaps and lotions away from the ZIO XT Patch.     Follow-Up: At [facility], you and your health needs are our priority.  As part of our continuing mission to provide you with exceptional heart care, we have created designated Provider Care Teams.  These Care Teams include your primary Cardiologist (physician) and Advanced Practice Providers (APPs -  Physician Assistants and Nurse Practitioners) who all work together to provide you with the care you need, when you need it.  We recommend signing up for the patient portal called "MyChart".  Sign up information is provided on this After Visit Summary.  MyChart is used to connect with patients for Virtual Visits (Telemedicine).  Patients are able to view lab/test results, encounter notes, upcoming appointments, etc.  Non-urgent messages can be sent to your provider as well.   To learn more about what you can do with MyChart, go to [email or web address].    Your next appointment:   1 month(s)  The format for your next appointment:   In Person  Provider:   Buford Dresser, MD  Look into finger pulse oximeter to check oxygen at home.   ZIO AT Long term monitor-Live Telemetry  Your physician has requested you wear a ZIO patch monitor for 7  days.  This is a single patch monitor. [employer] supplies one patch monitor per enrollment. Additional  stickers are not available.  Please do not apply patch if you will be having a Nuclear Stress Test, Echocardiogram, Cardiac CT, MRI,  or Chest Xray during the period you would be wearing the monitor. The patch cannot be worn during  these tests. You cannot remove and re-apply the ZIO AT patch monitor.  Your ZIO patch monitor will be mailed 3 day USPS to your address on file. It may take 3-5 days to  receive your monitor after you have been enrolled.  Once you have received your monitor, please review the enclosed instructions. Your monitor has  already been registered assigning a specific monitor serial # to you.   Billing and Patient Assistance Program information  Larry Reese has been supplied with any insurance information on record for billing. [employer] offers a sliding scale Patient Assistance Program for patients without insurance, or whose  insurance does not completely cover the cost of the ZIO patch monitor. You must apply for the  Patient Assistance Program to qualify for the discounted rate. To apply, call [employer] at [phone],  select option 4, select option 2 , ask to apply for the Patient Assistance Program, (you can request an  interpreter if needed). [employer] will ask your household income and how many people are in your  household. [employer] will quote your out-of-pocket cost based on this information. They will also be able  to set up a 12 month interest free payment plan if needed.  Applying the monitor   Shave hair from upper left chest.  Hold the abrader disc by orange tab. Rub the abrader in 40 strokes over left upper chest as indicated in  your monitor instructions.  Clean area with 4 enclosed alcohol pads. Use all pads to ensure the area is cleaned thoroughly. Let  dry.  Apply patch as indicated in monitor instructions. Patch will be placed under collarbone on left  side of  chest with arrow pointing upward.  Rub patch adhesive wings for 2 minutes. Remove the white label marked "1".  Remove the white label  marked "2". Rub patch adhesive wings for 2 additional minutes.  While looking in a mirror, press and release button in center of patch. A small green light will flash 3-4  times. This will be your only indicator that the monitor has been turned on.  Do not shower for the first 24 hours. You may shower after the first 24 hours.  Press the button if you feel a symptom. You will hear a small click. Record Date, Time and Symptom in  the Patient Log.   Starting the Gateway  In your kit there is a Hydrographic surveyor box the size of a cellphone. This is Airline pilot. It transmits all your  recorded data to [facility]. This box must always stay within 10 feet of you. Open the box and push the *  button. There will be a light that blinks orange and then green a few times. When the light stops  blinking, the Gateway is connected to the ZIO patch. Call [employer] at [phone] to confirm your monitor is transmitting.  Returning your monitor  Remove your patch and place it inside the [place]. In the lower half of the Gateway there is a white  bag with prepaid postage on it. Place Gateway in bag and seal. Mail package back to [place] as soon as  possible. Your physician should have your final report approximately 7 days after you have mailed back  your monitor. Call [place] at [phone] if you have questions regarding your ZIO AT  patch monitor. Call them immediately if you see an orange light blinking on your monitor.  If your monitor falls off in less than 4 days, contact our Monitor department at [phone]. If your  monitor becomes loose or falls off after 4 days call [employer] at [phone] for suggestions on  securing your monitor   Signed, Buford Dresser, MD PhD 05/21/2021   [place]

## 2021-05-21 NOTE — Telephone Encounter (Signed)
Zio by Theodore Demark is calling about abnormal zio monitor

## 2021-05-22 ENCOUNTER — Telehealth: Payer: Self-pay | Admitting: Cardiology

## 2021-05-22 DIAGNOSIS — R001 Bradycardia, unspecified: Secondary | ICD-10-CM | POA: Diagnosis not present

## 2021-05-22 NOTE — Telephone Encounter (Signed)
Spoke to representative -  per representative strip shows a heart rate drop to 33 - slow afib  Post strip to the web.

## 2021-05-22 NOTE — Telephone Encounter (Signed)
Called spoke to patient. Informed patient  office had a call  about his heart from this morning. Patient states he does not remember anything different this morning  No  issues ar present time.   Wife had to cal the patient from outside.

## 2021-05-22 NOTE — Telephone Encounter (Signed)
Larry Reese [employer] alerted for slow afib, hr dropped to 33 bpm this morning aroung 4:50 am.

## 2021-05-28 ENCOUNTER — Ambulatory Visit: Payer: Medicare HMO | Admitting: Orthopaedic Surgery

## 2021-05-28 DIAGNOSIS — I10 Essential (primary) hypertension: Secondary | ICD-10-CM | POA: Diagnosis not present

## 2021-05-28 DIAGNOSIS — I4892 Unspecified atrial flutter: Secondary | ICD-10-CM | POA: Diagnosis not present

## 2021-06-02 ENCOUNTER — Ambulatory Visit (INDEPENDENT_AMBULATORY_CARE_PROVIDER_SITE_OTHER): Payer: Medicare HMO

## 2021-06-02 ENCOUNTER — Other Ambulatory Visit: Payer: Self-pay

## 2021-06-02 DIAGNOSIS — R0602 Shortness of breath: Secondary | ICD-10-CM

## 2021-06-03 LAB — ECHOCARDIOGRAM COMPLETE
Area-P 1/2: 2.66 cm2
S' Lateral: 2.67 cm

## 2021-06-04 ENCOUNTER — Ambulatory Visit: Payer: Medicare HMO | Admitting: Orthopaedic Surgery

## 2021-06-04 ENCOUNTER — Other Ambulatory Visit: Payer: Self-pay

## 2021-06-04 ENCOUNTER — Encounter: Payer: Self-pay | Admitting: Orthopaedic Surgery

## 2021-06-04 VITALS — BP 132/86 | HR 70 | Ht 78.5 in | Wt 199.2 lb

## 2021-06-04 DIAGNOSIS — M25512 Pain in left shoulder: Secondary | ICD-10-CM | POA: Diagnosis not present

## 2021-06-04 DIAGNOSIS — G8929 Other chronic pain: Secondary | ICD-10-CM

## 2021-06-04 DIAGNOSIS — R001 Bradycardia, unspecified: Secondary | ICD-10-CM

## 2021-06-04 DIAGNOSIS — F17210 Nicotine dependence, cigarettes, uncomplicated: Secondary | ICD-10-CM | POA: Diagnosis not present

## 2021-06-04 MED ORDER — HYDROCODONE-ACETAMINOPHEN 7.5-325 MG PO TABS: ORAL_TABLET | ORAL | 0 refills | Status: DC

## 2021-06-04 NOTE — Progress Notes (Signed)
My shoulder is still hurting.  He has chronic pain of the left shoulder.  He has no new injury.  He has pain with overhead use.  He has no numbness or swelling.  He is taking his medicine.  Examination of left Upper Extremity is done.  Inspection:   Overall:  Elbow non-tender without crepitus or defects, forearm non-tender without crepitus or defects, wrist non-tender without crepitus or defects, hand non-tender.    Shoulder: with glenohumeral joint tenderness, without effusion.   Upper arm:  without swelling and tenderness   Range of motion:   Overall:  Full range of motion of the elbow, full range of motion of wrist and full range of motion in fingers.   Shoulder:  left  140 degrees forward flexion; 120 degrees abduction; 25 degrees internal rotation, 25 degrees external rotation, 10 degrees extension, 40 degrees adduction.   Stability:   Overall:  Shoulder, elbow and wrist stable   Strength and Tone:   Overall full shoulder muscles strength, full upper arm strength and normal upper arm bulk and tone.   Encounter Diagnoses  Name Primary?   Chronic left shoulder pain Yes   Cigarette nicotine dependence without complication    I have reviewed the [place] web site prior to prescribing narcotic medicine for this patient.  Return in three months.  Continue exercises.  Call if any problem.  Precautions discussed.  Electronically Signed Sanjuana Kava, MD 9/15/202211:09 AM

## 2021-06-27 DIAGNOSIS — I10 Essential (primary) hypertension: Secondary | ICD-10-CM | POA: Diagnosis not present

## 2021-06-27 DIAGNOSIS — I4892 Unspecified atrial flutter: Secondary | ICD-10-CM | POA: Diagnosis not present

## 2021-07-01 ENCOUNTER — Encounter (HOSPITAL_BASED_OUTPATIENT_CLINIC_OR_DEPARTMENT_OTHER): Payer: Self-pay | Admitting: Cardiology

## 2021-07-01 ENCOUNTER — Other Ambulatory Visit: Payer: Self-pay

## 2021-07-01 ENCOUNTER — Ambulatory Visit (HOSPITAL_BASED_OUTPATIENT_CLINIC_OR_DEPARTMENT_OTHER): Payer: Medicare HMO | Admitting: Cardiology

## 2021-07-01 VITALS — BP 114/72 | HR 67 | Ht 78.5 in | Wt 198.8 lb

## 2021-07-01 DIAGNOSIS — Z8679 Personal history of other diseases of the circulatory system: Secondary | ICD-10-CM | POA: Diagnosis not present

## 2021-07-01 DIAGNOSIS — Z712 Person consulting for explanation of examination or test findings: Secondary | ICD-10-CM

## 2021-07-01 DIAGNOSIS — R001 Bradycardia, unspecified: Secondary | ICD-10-CM

## 2021-07-01 DIAGNOSIS — I10 Essential (primary) hypertension: Secondary | ICD-10-CM

## 2021-07-01 DIAGNOSIS — D6869 Other thrombophilia: Secondary | ICD-10-CM | POA: Diagnosis not present

## 2021-07-01 DIAGNOSIS — I4821 Permanent atrial fibrillation: Secondary | ICD-10-CM

## 2021-07-01 NOTE — Progress Notes (Signed)
Cardiology Office Note:    Date:  07/01/2021   ID:  Larry Reese, DOB June 01, 1946, MRN [id number]  PCP:  [name], MD  Cardiologist:  Buford Dresser, MD  Referring MD: [name], MD   CC: follow up  History of Present Illness:    Larry Reese is a 75 y.o. male with a hx of tobacco use, hypertension, subdural hematoma 2006 who is seen for follow up today. He was initially seen 05/07/20 as a new consult at the request of [name], MD for the evaluation and management of atrial fibrillation, preoperative evaluation.  Cardiac history: At preop visit 05/01/20, noted to be in new atrial fibrillation on ECG. Referred to cardiology for evaluation prior to hip surgery. History reported stroke in 2005, but this actually appears to be a subdural hematoma in 2006.   Today: He is accompanied by his wife. Overall he is feeling good, but is suffering from bilateral hip pain that limits his physical activity.  Occasionally he has a short episode of a "funny feeling" associated with feeling like his heart is beating slightly faster.  We reviewed his recent monitor results (05/2021) in detail.  He denies any chest pain, or shortness of breath. No lightheadedness, headaches, syncope, orthopnea, or PND. Also has no lower extremity edema.   Past Medical History:  Diagnosis Date   Anemia    Arthritis    Dupuytren's contracture of right hand    Hx of adenomatous colonic polyps    Hypertension    Pneumothorax    Stroke [facility]) 2005   left sided weakness   Trigger finger     Past Surgical History:  Procedure Laterality Date   COLONOSCOPY  May 2010   Dr. fields: 5 cm terminal ileum normal, 6 polyps removed, moderate internal hemorrhoids, simple adenomas, next colonoscopy May 2015   COLONOSCOPY N/A 08/01/2015   Procedure: COLONOSCOPY;  Surgeon: Danie Binder, MD;  [facility];  Service: Endoscopy;  Laterality: N/A;  0830   CRANIOTOMY  2000   pressure    INGUINAL HERNIA REPAIR Right    LEFT ATRIAL APPENDAGE OCCLUSION N/A 09/04/2020   Procedure: LEFT ATRIAL APPENDAGE OCCLUSION;  Surgeon: Vickie Epley, MD;  Location: [place] CV LAB;  Service: Cardiovascular;  Laterality: N/A;   OLECRANON BURSECTOMY Right 02/11/2015   Procedure: EXCISION RIGHT OLECRANON BURSA;  Surgeon: Sanjuana Kava, MD;  Location: [facility] OR[facility];  Service: Orthopedics;  Laterality: Right;   repair of trigger finger Left    pinky   TEE WITHOUT CARDIOVERSION N/A 09/04/2020   Procedure: TRANSESOPHAGEAL ECHOCARDIOGRAM (TEE);  Surgeon: Vickie Epley, MD;  Location: [place] CV LAB;  Service: Cardiovascular;  Laterality: N/A;   TEE WITHOUT CARDIOVERSION N/A 10/13/2020   Procedure: TRANSESOPHAGEAL ECHOCARDIOGRAM (TEE);  Surgeon: Geralynn Rile, MD;  Location: [facility] ENDOSCOPY;  Service: Cardiovascular;  Laterality: N/A;    Current Medications: Current Outpatient Medications on File Prior to Visit  Medication Sig   amLODipine (NORVASC) 10 MG tablet Take 10 mg by mouth daily.   aspirin EC 81 MG tablet Take 81 mg by mouth daily.   diclofenac sodium (VOLTAREN) 1 % GEL Apply 2 g topically 4 (four) times daily.   HYDROcodone-acetaminophen (NORCO) 7.5-325 MG tablet TAKE ONE TABLET BY MOUTH EVERY SIX HOURSAS NEEDED FOR PAIN MAY CAUSE DROWSINESS   tamsulosin (FLOMAX) 0.4 MG CAPS capsule Take 0.4 mg by mouth daily.    No current facility-administered medications on file prior to visit.  Allergies:   Aleve [naproxen], Celecoxib, and Fluoxetine hcl   Social History   Tobacco Use   Smoking status: Every Day    Packs/day: 0.25    Years: 50.00    Pack years: 12.50    Types: Cigarettes   Smokeless tobacco: Never   Tobacco comments:    5-6 cigarettes per day  Vaping Use   Vaping Use: Never used  Substance Use Topics   Alcohol use: Yes    Alcohol/week: 5.0 - 6.0 standard drinks    Types: 5 - 6 Cans of beer per week    Comment: 3-4 beers/day   Drug use: No     Family History: family history includes Esophageal cancer in his father. There is no history of Colon cancer.  ROS:   Please see the history of present illness.   (+) Bilateral hip pain (+) Palpitations Additional pertinent ROS otherwise unremarkable.  EKGs/Labs/Other Studies Reviewed:    The following studies were reviewed today:  Non-Telemetry Monitoring 06/10/2021 Patch Wear Time:  14 days and 0 hours   Atrial Fibrillation occurred continuously (100% burden), ranging from 31-114 bpm (avg of 63 bpm). 1 Pause occurred lasting 3 secs (20 bpm) on 05/22/21 at 3:44 AM. Isolated VEs were rare (<1.0%). There was 1 triggered event, which was atrial fibrillation.  ECHO 06/02/2021  1. Left ventricular ejection fraction, by estimation, is 60 to 65%. The  left ventricle has normal function. The left ventricle has no regional  wall motion abnormalities. Left ventricular diastolic function could not  be evaluated.   2. Right ventricular systolic function is normal. The right ventricular  size is normal. There is normal pulmonary artery systolic pressure. The  estimated right ventricular systolic pressure is 17.0 mmHg.   3. Left atrial size was mildly dilated.   4. The mitral valve is normal in structure. Trivial mitral valve  regurgitation. No evidence of mitral stenosis.   5. The aortic valve is tricuspid. Aortic valve regurgitation is not  visualized. No aortic stenosis is present.   6. Aortic dilatation noted. There is mild dilatation of the aortic root,  measuring 40 mm.   7. The inferior vena cava is normal in size with greater than 50%  respiratory variability, suggesting right atrial pressure of 3 mmHg.   Comparison(s): No significant change from prior study.  ECHO TEE 10/13/2020 1. A 31 mm Watchman FLX is present in the LAA. Max diameter 27.9 mm (10%  compression). No device related thrombus or device leak. Left atrial size  was mildly dilated. No left atrial/left atrial  appendage thrombus was  detected.   2. Aorta measurements comparable to recent CT. Aneurysm of the ascending  aorta, measuring 48 mm. There is Severe (Grade IV) protruding plaque  involving the descending aorta.   3. Left ventricular ejection fraction, by estimation, is 60 to 65%. The  left ventricle has normal function. The left ventricle has no regional  wall motion abnormalities.   4. Right ventricular systolic function is normal. The right ventricular  size is normal.   5. The mitral valve is grossly normal. Mild mitral valve regurgitation.  No evidence of mitral stenosis.   6. The aortic valve is tricuspid. Aortic valve regurgitation is not  visualized. No aortic stenosis is present.   Cardiac CT 07/21/2020 IMPRESSION: 1.  Moderate RAE, mild LAE with no LAA thrombus   2. Chicken wing morphology LAA suitable for a 27 mm Watchman FLX with 12.6% expected compression   3.  Normal PV  anatomy with no anomaly   4.  Normal atrial septum with no PFO or lipomatous hypertrophy   5.  No pericardial effusion   6.  Optimum working angle to view LAA ostium RAO 31 Caudal 9 degrees   7. Suggest anterior curve sheath given anteriorly directed appendage along LV free wall   8. Moderate ascending aortic root dilatation 4.7 cm in orthogonal planes   9.  Suggest posterior mid fossa trans-septal puncture  L Atrial Appendage Occlusion 1216/2021 CONCLUSIONS:  1.Successful implantation of a WATCHMAN left atrial appendage occlusive device    2. TEE demonstrating no LAA thrombus 3. No early apparent complications.  4. Post procedural anticoagulation strategy will be Apixaban 2.5mg  PO BID and Aspirin 81mg  PO daily.  Echo 08/04/20 1. Left ventricular ejection fraction, by estimation, is 60 to 65%. The  left ventricle has normal function. The left ventricle has no regional  wall motion abnormalities. Left ventricular diastolic function could not  be evaluated.   2. Right ventricular systolic  function is normal. The right ventricular  size is normal. There is normal pulmonary artery systolic pressure. The  estimated right ventricular systolic pressure is 33.8 mmHg.   3. Left atrial size was mildly dilated.   4. The mitral valve is normal in structure. Mild mitral valve  regurgitation. No evidence of mitral stenosis.   5. The aortic valve is normal in structure. Aortic valve regurgitation is  not visualized. No aortic stenosis is present.   6. Aortic dilatation noted. There is mild dilatation of the aortic root,  measuring 40 mm. There is moderate to severe dilatation of the ascending  aorta, measuring 50 mm.   7. The inferior vena cava is normal in size with greater than 50%  respiratory variability, suggesting right atrial pressure of 3 mmHg.   EKG:  EKG is personally reviewed.   07/01/21: not ordered today 05/21/21: atrial fibrillation with slow ventricular response at 54 bpm 05/08/20 demonstrates atrial fibrillation with slow ventricular response of 57 bpm.  Recent Labs: 09/17/2020: BUN 10; Creatinine, Ser 1.18; Hemoglobin 12.7; Platelets 364; Potassium 4.2; Sodium 133  Recent Lipid Panel No results found for: CHOL, TRIG, HDL, CHOLHDL, VLDL, LDLCALC, LDLDIRECT  Physical Exam:    VS:  BP 114/72   Pulse 67   Ht 6' 6.5" (1.994 m)   Wt 198 lb 12.8 oz (90.2 kg)   SpO2 93%   BMI 22.68 kg/m     Wt Readings from Last 3 Encounters:  07/01/21 198 lb 12.8 oz (90.2 kg)  06/04/21 199 lb 3.2 oz (90.4 kg)  05/21/21 198 lb 9.6 oz (90.1 kg)    GEN: Well nourished, well developed in no acute distress HEENT: Normal, moist mucous membranes NECK: No JVD CARDIAC: irregularly irregular rhythm, normal S1 and S2, no rubs or gallops. No murmur. VASCULAR: Radial and DP pulses 2+ bilaterally. No carotid bruits RESPIRATORY:  Clear to auscultation without rales, wheezing or rhonchi  ABDOMEN: Soft, non-tender, non-distended MUSCULOSKELETAL:  Ambulates independently SKIN: Warm and dry, no  edema NEUROLOGIC:  Alert and oriented x 3. No focal neuro deficits noted. PSYCHIATRIC:  Normal affect    ASSESSMENT:    1. Encounter to discuss test results   2. Permanent atrial fibrillation ([place])   3. History of subdural hematoma   4. Secondary hypercoagulable state ([place])   5. Bradycardia   6. Essential hypertension    PLAN:    Bradycardia -reviewed monitor. Has occasional dips into HR in the 30s, one 3 second pause,  but good heart rate variability. In permanent afib -symptoms from prior visit have resolved without intervention.  -reviewed monitoring O2, heart rate if he feels poorly  Atrial fibrillation, permanent CHA2DS2/VAS Stroke Risk Points =5 (age, hypertension, CVAx2, PAD) initially, though now appears to have been a subdural hematoma and not a stroke, placing his CHADSVASC=3 -s/p Watchman and doing well  Hypertension: at goal of <130/80 -continue amlodipine 10 mg daily  PAD: -continue aspirin -have discussed statin, tobacco cessation. Precontemplative, continue to address  Hypercholesterolemia: -with PAD, goal LDL <70 -per [facility], lipids 12/14/19 show Tchol 221, HDL 40, LDL 155, TG 139. Will see if he has had more recent labs done with his PCP -will continue to discuss statin. He has declined in the past   Cardiac risk counseling and prevention recommendations: -recommend heart healthy/Mediterranean diet, with whole grains, fruits, vegetable, fish, lean meats, nuts, and olive oil. Limit salt. -recommend moderate walking, 3-5 times/week for 30-50 minutes each session. Aim for at least 150 minutes.week. Goal should be pace of 3 miles/hours, or walking 1.5 miles in 30 minutes -recommend avoidance of tobacco products. Avoid excess alcohol.  Plan for follow up: 6 months   Buford Dresser, MD, PhD [place]  [facility]   Medication Adjustments/Labs and Tests Ordered: Current medicines are reviewed at length with the patient today.  Concerns regarding  medicines are outlined above.  No orders of the defined types were placed in this encounter.   No orders of the defined types were placed in this encounter.   Patient Instructions  Medication Instructions:  Your Physician recommend you continue on your current medication as directed.    *If you need a refill on your cardiac medications before your next appointment, please call your pharmacy*   Lab Work: None ordered today   Testing/Procedures: None ordered today   Follow-Up: At [facility], you and your health needs are our priority.  As part of our continuing mission to provide you with exceptional heart care, we have created designated Provider Care Teams.  These Care Teams include your primary Cardiologist (physician) and Advanced Practice Providers (APPs -  Physician Assistants and Nurse Practitioners) who all work together to provide you with the care you need, when you need it.  We recommend signing up for the patient portal called "MyChart".  Sign up information is provided on this After Visit Summary.  MyChart is used to connect with patients for Virtual Visits (Telemedicine).  Patients are able to view lab/test results, encounter notes, upcoming appointments, etc.  Non-urgent messages can be sent to your provider as well.   To learn more about what you can do with MyChart, go to [email or web address].    Your next appointment:   6 month(s)  The format for your next appointment:   In Person  Provider:   Buford Dresser, MD      [facility] [place],acting as a scribe for Buford Dresser, MD.,have documented all relevant documentation on the behalf of Buford Dresser, MD,as directed by  Buford Dresser, MD while in the presence of Buford Dresser, MD.  I, Buford Dresser, MD, have reviewed all documentation for this visit. The documentation on 07/20/21 for the exam, diagnosis, procedures, and orders are all accurate and complete.    Signed, Buford Dresser, MD PhD 07/01/2021   [place]

## 2021-07-01 NOTE — Patient Instructions (Signed)

## 2021-07-08 ENCOUNTER — Telehealth: Payer: Self-pay | Admitting: Orthopaedic Surgery

## 2021-07-20 ENCOUNTER — Encounter (HOSPITAL_BASED_OUTPATIENT_CLINIC_OR_DEPARTMENT_OTHER): Payer: Self-pay | Admitting: Cardiology

## 2021-07-23 DIAGNOSIS — Z23 Encounter for immunization: Secondary | ICD-10-CM | POA: Diagnosis not present

## 2021-07-27 ENCOUNTER — Other Ambulatory Visit: Payer: Self-pay | Admitting: Orthopaedic Surgery

## 2021-07-28 ENCOUNTER — Ambulatory Visit: Payer: Self-pay

## 2021-07-28 ENCOUNTER — Other Ambulatory Visit: Payer: Self-pay

## 2021-07-28 ENCOUNTER — Ambulatory Visit: Payer: Medicare HMO | Admitting: Orthopaedic Surgery

## 2021-07-28 VITALS — Ht 79.0 in | Wt 200.0 lb

## 2021-07-28 DIAGNOSIS — M25551 Pain in right hip: Secondary | ICD-10-CM

## 2021-07-28 DIAGNOSIS — I10 Essential (primary) hypertension: Secondary | ICD-10-CM | POA: Diagnosis not present

## 2021-07-28 DIAGNOSIS — M87051 Idiopathic aseptic necrosis of right femur: Secondary | ICD-10-CM | POA: Diagnosis not present

## 2021-07-28 DIAGNOSIS — M549 Dorsalgia, unspecified: Secondary | ICD-10-CM | POA: Diagnosis not present

## 2021-07-28 NOTE — Progress Notes (Signed)
Office Visit Note   Patient: Larry Reese           Date of Birth: 05-08-1946           MRN: [id number] Visit Date: 07/28/2021              Requested by: [name], MD [address],  [place] [id number] PCP: [name], MD   Assessment & Plan: Visit Diagnoses:  1. Pain in right hip   2. Avascular necrosis of bone of right hip (HCC)     Plan: In terms of the right hip Larry Reese has end-stage DJD with failure of conservative management and at this point he would like to move forward with scheduling for right total hip replacement pending medical and cardiac clearance.  We did incidentally find a large calcific mass on x-rays today therefore we will need further investigation with an MRI to rule out malignancy.  He did have a fall onto the right thigh earlier this year so this may be just myositis ossificans.  We will notify the patient of the MRI results.  Follow-Up Instructions: No follow-ups on file.   Orders:  Orders Placed This Encounter  Procedures   XR HIP UNILAT W OR W/O PELVIS 2-3 VIEWS RIGHT   XR FEMUR, MIN 2 VIEWS RIGHT   No orders of the defined types were placed in this encounter.     Procedures: No procedures performed   Clinical Data: No additional findings.   Subjective: Chief Complaint  Patient presents with   Right Hip - Pain    Larry Reese is a pleasant 75 year old gentleman here with his wife for evaluation of chronic and severe right hip pain due to DJD and avascular necrosis.  He has experienced worsening pain and now it is severe and constant.  He is very limited by this.  He has had prior cortisone injections with temporary relief.  He has undergone extensive conservative management.  He is ready to schedule right hip replacement at this time.   Review of Systems  Constitutional: Negative.   All other systems reviewed and are negative.   Objective: Vital Signs: Ht 6\' 7"  (2.007 m)   Wt 200 lb (90.7 kg)   BMI 22.53  kg/m   Physical Exam Vitals and nursing note reviewed.  Constitutional:      Appearance: He is well-developed.  Pulmonary:     Effort: Pulmonary effort is normal.  Abdominal:     Palpations: Abdomen is soft.  Skin:    General: Skin is warm.  Neurological:     Mental Status: He is alert and oriented to person, place, and time.  Psychiatric:        Behavior: Behavior normal.        Thought Content: Thought content normal.        Judgment: Judgment normal.    Ortho Exam  Right hip exam shows very limited range of motion.  Severe pain with FADIR test at 10 degrees of internal rotation.  Hip flexion limited to 70 degrees with severe pain.  Right thigh shows slight enlargement with palpation of the quadriceps.  Specialty Comments:  No specialty comments available.  Imaging: XR FEMUR, MIN 2 VIEWS RIGHT  Result Date: 07/28/2021 Large calcification anterior to the femoral shaft.  Possible myositis ossificans.    PMFS History: Patient Active Problem List   Diagnosis Date Noted   Paroxysmal atrial fibrillation ([place]) 09/04/2020   Typical atrial flutter ([place]) 08/20/2020   Secondary hypercoagulable  state ([place]) 08/20/2020   History of subdural hematoma 06/12/2020   PAD (peripheral artery disease) ([place]) 05/27/2020   Essential hypertension 05/27/2020   Atrial fibrillation ([place]) 05/27/2020   Chronic toe pain, right foot 02/20/2019   Hematoma of left hip 02/20/2019   Avascular necrosis of bone of right hip ([place]) 01/23/2019   Pain in left hip 01/23/2019   Rib pain on left side 01/23/2019   History of colonic polyps    Hepatitis, alcoholic 66/02/3015   ETOH abuse 02/10/2015   Internal hemorrhoids 07/17/2009   Colon adenomas 07/15/2009   Past Medical History:  Diagnosis Date   Anemia    Arthritis    Dupuytren's contracture of right hand    Hx of adenomatous colonic polyps    Hypertension    Pneumothorax    Stroke [facility]) 2005   left sided weakness   Trigger finger      Family History  Problem Relation Age of Onset   Esophageal cancer Father    Colon cancer Neg Hx     Past Surgical History:  Procedure Laterality Date   COLONOSCOPY  May 2010   Dr. fields: 5 cm terminal ileum normal, 6 polyps removed, moderate internal hemorrhoids, simple adenomas, next colonoscopy May 2015   COLONOSCOPY N/A 08/01/2015   Procedure: COLONOSCOPY;  Surgeon: Danie Binder, MD;  [facility];  Service: Endoscopy;  Laterality: N/A;  0830   CRANIOTOMY  2000   pressure   INGUINAL HERNIA REPAIR Right    LEFT ATRIAL APPENDAGE OCCLUSION N/A 09/04/2020   Procedure: LEFT ATRIAL APPENDAGE OCCLUSION;  Surgeon: Vickie Epley, MD;  Location: [place] CV LAB;  Service: Cardiovascular;  Laterality: N/A;   OLECRANON BURSECTOMY Right 02/11/2015   Procedure: EXCISION RIGHT OLECRANON BURSA;  Surgeon: Sanjuana Kava, MD;  Location: [facility];  Service: Orthopedics;  Laterality: Right;   repair of trigger finger Left    pinky   TEE WITHOUT CARDIOVERSION N/A 09/04/2020   Procedure: TRANSESOPHAGEAL ECHOCARDIOGRAM (TEE);  Surgeon: Vickie Epley, MD;  Location: [place] CV LAB;  Service: Cardiovascular;  Laterality: N/A;   TEE WITHOUT CARDIOVERSION N/A 10/13/2020   Procedure: TRANSESOPHAGEAL ECHOCARDIOGRAM (TEE);  Surgeon: Geralynn Rile, MD;  Location: [place];  Service: Cardiovascular;  Laterality: N/A;   Social History   Occupational History   Occupation: retired  Tobacco Use   Smoking status: Every Day    Packs/day: 0.25    Years: 50.00    Pack years: 12.50    Types: Cigarettes   Smokeless tobacco: Never   Tobacco comments:    5-6 cigarettes per day  Vaping Use   Vaping Use: Never used  Substance and Sexual Activity   Alcohol use: Yes    Alcohol/week: 5.0 - 6.0 standard drinks    Types: 5 - 6 Cans of beer per week    Comment: 3-4 beers/day   Drug use: No   Sexual activity: Yes    Birth control/protection: None

## 2021-07-30 ENCOUNTER — Telehealth: Payer: Self-pay | Admitting: Orthopaedic Surgery

## 2021-08-11 ENCOUNTER — Other Ambulatory Visit: Payer: Self-pay

## 2021-08-11 ENCOUNTER — Ambulatory Visit (HOSPITAL_COMMUNITY)
Admission: RE | Admit: 2021-08-11 | Discharge: 2021-08-11 | Disposition: A | Discharge status: Home / Self Care | Payer: Medicare HMO | Source: Ambulatory Visit | Attending: Orthopaedic Surgery | Admitting: Orthopaedic Surgery

## 2021-08-11 DIAGNOSIS — M79651 Pain in right thigh: Secondary | ICD-10-CM | POA: Diagnosis not present

## 2021-08-11 DIAGNOSIS — M1611 Unilateral primary osteoarthritis, right hip: Secondary | ICD-10-CM | POA: Diagnosis not present

## 2021-08-11 DIAGNOSIS — M25551 Pain in right hip: Secondary | ICD-10-CM

## 2021-08-11 DIAGNOSIS — R2241 Localized swelling, mass and lump, right lower limb: Secondary | ICD-10-CM | POA: Diagnosis not present

## 2021-08-11 MED ORDER — GADOBUTROL 1 MMOL/ML IV SOLN
10.0000 mL | Freq: Once | INTRAVENOUS | Status: AC | PRN
  Administered 2021-08-11: 10 mL via INTRAVENOUS

## 2021-08-18 NOTE — Progress Notes (Signed)
Patient aware. He will still like to come in tomorrow to review scan.

## 2021-08-18 NOTE — Progress Notes (Signed)
Please let patient know that I have reviewed the MRI and we can move forward with surgery.  The mass is not cancerous.

## 2021-08-19 ENCOUNTER — Ambulatory Visit: Payer: Medicare HMO | Admitting: Orthopaedic Surgery

## 2021-08-19 ENCOUNTER — Other Ambulatory Visit: Payer: Self-pay

## 2021-08-19 ENCOUNTER — Encounter: Payer: Self-pay | Admitting: Orthopaedic Surgery

## 2021-08-19 DIAGNOSIS — M1611 Unilateral primary osteoarthritis, right hip: Secondary | ICD-10-CM

## 2021-08-19 NOTE — Progress Notes (Signed)
Office Visit Note   Patient: Larry Reese           Date of Birth: [birth year]           MRN: [id number] Visit Date: 08/19/2021              Requested by: [name], MD [address] [place],  [place] [address] PCP: [name], MD   Assessment & Plan: Visit Diagnoses:  1. Primary osteoarthritis of right hip     Plan: Mr. Weide returns today to discuss right femur MRI.  This was done due to incidental finding of calcification on plain film.  The MRI confirms that the lesion is consistent with myositis ossificans.  Given these findings we can now move forward with scheduling for right total hip replacement pending clearance from PCP.  Once we have the clearance Jackelyn Poling will call the patient to schedule surgery.  Again we talked about the risk benefits rehab recovery of the surgery.  Questions encouraged and answered.  Follow-Up Instructions: No follow-ups on file.   Orders:  No orders of the defined types were placed in this encounter.  No orders of the defined types were placed in this encounter.     Procedures: No procedures performed   Clinical Data: No additional findings.   Subjective: Chief Complaint  Patient presents with   Right Hip - Pain, Follow-up    HPI  Review of Systems   Objective: Vital Signs: There were no vitals taken for this visit.  Physical Exam  Ortho Exam  Specialty Comments:  No specialty comments available.  Imaging: No results found.   PMFS History: Patient Active Problem List   Diagnosis Date Noted   Paroxysmal atrial fibrillation ([place]) 09/04/2020   Typical atrial flutter ([place]) 08/20/2020   Secondary hypercoagulable state ([place]) 08/20/2020   History of subdural hematoma 06/12/2020   PAD (peripheral artery disease) ([place]) 05/27/2020   Essential hypertension 05/27/2020   Atrial fibrillation ([place]) 05/27/2020   Chronic toe pain, right foot 02/20/2019   Hematoma of left hip 02/20/2019   Avascular  necrosis of bone of right hip ([place]) 01/23/2019   Pain in left hip 01/23/2019   Rib pain on left side 01/23/2019   History of colonic polyps    Hepatitis, alcoholic 71/24/5809   ETOH abuse 02/10/2015   Internal hemorrhoids 07/17/2009   Colon adenomas 07/15/2009   Past Medical History:  Diagnosis Date   Anemia    Arthritis    Dupuytren's contracture of right hand    Hx of adenomatous colonic polyps    Hypertension    Pneumothorax    Stroke ([place]) 2005   left sided weakness   Trigger finger     Family History  Problem Relation Age of Onset   Esophageal cancer Father    Colon cancer Neg Hx     Past Surgical History:  Procedure Laterality Date   COLONOSCOPY  May 2010   Dr. fields: 5 cm terminal ileum normal, 6 polyps removed, moderate internal hemorrhoids, simple adenomas, next colonoscopy May 2015   COLONOSCOPY N/A 08/01/2015   Procedure: COLONOSCOPY;  Surgeon: Danie Binder, MD;  [facility];  Service: Endoscopy;  Laterality: N/A;  0830   CRANIOTOMY  2000   pressure   INGUINAL HERNIA REPAIR Right    LEFT ATRIAL APPENDAGE OCCLUSION N/A 09/04/2020   Procedure: LEFT ATRIAL APPENDAGE OCCLUSION;  Surgeon: Vickie Epley, MD;  Location: [place] CV LAB;  Service: Cardiovascular;  Laterality: N/A;  OLECRANON BURSECTOMY Right 02/11/2015   Procedure: EXCISION RIGHT OLECRANON BURSA;  Surgeon: Sanjuana Kava, MD;  Location: [facility];  Service: Orthopedics;  Laterality: Right;   repair of trigger finger Left    pinky   TEE WITHOUT CARDIOVERSION N/A 09/04/2020   Procedure: TRANSESOPHAGEAL ECHOCARDIOGRAM (TEE);  Surgeon: Vickie Epley, MD;  Location: [place] CV LAB;  Service: Cardiovascular;  Laterality: N/A;   TEE WITHOUT CARDIOVERSION N/A 10/13/2020   Procedure: TRANSESOPHAGEAL ECHOCARDIOGRAM (TEE);  Surgeon: Geralynn Rile, MD;  Location: [place];  Service: Cardiovascular;  Laterality: N/A;   Social History   Occupational History   Occupation:  retired  Tobacco Use   Smoking status: Every Day    Packs/day: 0.25    Years: 50.00    Pack years: 12.50    Types: Cigarettes   Smokeless tobacco: Never   Tobacco comments:    5-6 cigarettes per day  Vaping Use   Vaping Use: Never used  Substance and Sexual Activity   Alcohol use: Yes    Alcohol/week: 5.0 - 6.0 standard drinks    Types: 5 - 6 Cans of beer per week    Comment: 3-4 beers/day   Drug use: No   Sexual activity: Yes    Birth control/protection: None

## 2021-08-27 DIAGNOSIS — I739 Peripheral vascular disease, unspecified: Secondary | ICD-10-CM | POA: Diagnosis not present

## 2021-08-27 DIAGNOSIS — I10 Essential (primary) hypertension: Secondary | ICD-10-CM | POA: Diagnosis not present

## 2021-08-31 ENCOUNTER — Telehealth: Payer: Self-pay | Admitting: Orthopedic Surgery

## 2021-09-03 ENCOUNTER — Encounter: Payer: Self-pay | Admitting: Orthopaedic Surgery

## 2021-09-03 ENCOUNTER — Other Ambulatory Visit: Payer: Self-pay

## 2021-09-03 ENCOUNTER — Ambulatory Visit: Payer: Medicare HMO | Admitting: Orthopaedic Surgery

## 2021-09-03 VITALS — BP 162/97 | HR 74 | Ht 78.5 in | Wt 204.0 lb

## 2021-09-03 DIAGNOSIS — M25512 Pain in left shoulder: Secondary | ICD-10-CM | POA: Diagnosis not present

## 2021-09-03 DIAGNOSIS — G8929 Other chronic pain: Secondary | ICD-10-CM

## 2021-09-03 DIAGNOSIS — F17210 Nicotine dependence, cigarettes, uncomplicated: Secondary | ICD-10-CM | POA: Diagnosis not present

## 2021-09-03 MED ORDER — HYDROCODONE-ACETAMINOPHEN 7.5-325 MG PO TABS: ORAL_TABLET | ORAL | 0 refills | Status: DC

## 2021-09-03 NOTE — Progress Notes (Signed)
My shoulder is sore.  His left shoulder is tender with the cold weather.  He has no new trauma. He has no numbness.  Examination of left Upper Extremity is done.  Inspection:   Overall:  Elbow non-tender without crepitus or defects, forearm non-tender without crepitus or defects, wrist non-tender without crepitus or defects, hand non-tender.    Shoulder: with glenohumeral joint tenderness, without effusion.   Upper arm:  without swelling and tenderness   Range of motion:   Overall:  Full range of motion of the elbow, full range of motion of wrist and full range of motion in fingers.   Shoulder:  left  145 degrees forward flexion; 120 degrees abduction; 25 degrees internal rotation, 25 degrees external rotation, 10 degrees extension, 40 degrees adduction.   Stability:   Overall:  Shoulder, elbow and wrist stable   Strength and Tone:   Overall full shoulder muscles strength, full upper arm strength and normal upper arm bulk and tone.   Encounter Diagnoses  Name Primary?   Chronic left shoulder pain Yes   Cigarette nicotine dependence without complication    He is to have total hip on the right for avascular necrosis in January and then the other hip done at a later time.  Call if any problem.  Precautions discussed.  Return in three months.  I have reviewed the [place] web site prior to prescribing narcotic medicine for this patient.  Electronically Signed Sanjuana Kava, MD 12/15/202210:10 AM

## 2021-09-24 ENCOUNTER — Ambulatory Visit: Payer: Medicare HMO | Admitting: Orthopaedic Surgery

## 2021-09-27 DIAGNOSIS — I10 Essential (primary) hypertension: Secondary | ICD-10-CM | POA: Diagnosis not present

## 2021-09-27 DIAGNOSIS — M1990 Unspecified osteoarthritis, unspecified site: Secondary | ICD-10-CM | POA: Diagnosis not present

## 2021-10-06 ENCOUNTER — Telehealth: Payer: Self-pay | Admitting: Radiology

## 2021-10-06 MED ORDER — HYDROCODONE-ACETAMINOPHEN 7.5-325 MG PO TABS: ORAL_TABLET | ORAL | 0 refills | Status: DC

## 2021-10-06 NOTE — Telephone Encounter (Signed)
Patient called and asked for refill to be sent to pharmacy so he can pick it up on Friday.    [place].

## 2021-10-12 ENCOUNTER — Other Ambulatory Visit: Payer: Self-pay | Admitting: Physician Assistant

## 2021-10-12 MED ORDER — OXYCODONE HCL 5 MG PO TABS: ORAL_TABLET | ORAL | 0 refills | Status: DC

## 2021-10-12 MED ORDER — ONDANSETRON HCL 4 MG PO TABS: 4.0000 mg | ORAL_TABLET | Freq: Three times a day (TID) | ORAL | 0 refills | Status: DC | PRN

## 2021-10-12 MED ORDER — DOCUSATE SODIUM 100 MG PO CAPS: 100.0000 mg | ORAL_CAPSULE | Freq: Every day | ORAL | 2 refills | Status: AC | PRN

## 2021-10-12 MED ORDER — ASPIRIN EC 81 MG PO TBEC: 81.0000 mg | DELAYED_RELEASE_TABLET | Freq: Two times a day (BID) | ORAL | 0 refills | Status: DC

## 2021-10-12 MED ORDER — METHOCARBAMOL 500 MG PO TABS: 500.0000 mg | ORAL_TABLET | Freq: Two times a day (BID) | ORAL | 2 refills | Status: DC | PRN

## 2021-10-14 NOTE — Pre-Procedure Instructions (Signed)
Surgical Instructions    Your procedure is scheduled on Monday, October 19, 2021 at 12:51 PM.  Report to [facility] Main Entrance "A" at 10:50 A.M., then check in with the Admitting office.  Call this number if you have problems the morning of surgery:  [phone]   If you have any questions prior to your surgery date call [phone]: Open Monday-Friday 8am-4pm    Remember:  Do not eat after midnight the night before your surgery  You may drink clear liquids until 9:50 AM the morning of your surgery.   Clear liquids allowed are: Water, Non-Citrus Juices (without pulp), Carbonated Beverages, Clear Tea, Black Coffee Only (NO MILK, CREAM OR POWDERED CREAMER of any kind), and Gatorade.   Enhanced Recovery after Surgery for Orthopedics Enhanced Recovery after Surgery is a protocol used to improve the stress on your body and your recovery after surgery.  Patient Instructions  The day of surgery (if you do NOT have diabetes):  Drink ONE (1) Pre-Surgery Clear Ensure by 9:50 am the morning of surgery   This drink was given to you during your hospital  pre-op appointment visit. Nothing else to drink after completing the  Pre-Surgery Clear Ensure.         If you have questions, please contact your surgeons office.     Take these medicines the morning of surgery with A SIP OF WATER:  amLODipine (NORVASC) tamsulosin (FLOMAX)  IF NEEDED: HYDROcodone-acetaminophen (NORCO)  ondansetron (ZOFRAN) oxyCODONE (ROXICODONE)   As of today, STOP taking any Aspirin (unless otherwise instructed by your surgeon) Aleve, Naproxen, Ibuprofen, Motrin, Advil, Goody's, BC's, all herbal medications, fish oil, and all vitamins. This includes your diclofenac sodium (VOLTAREN) 1 % GEL.                     Do NOT Smoke (Tobacco/Vaping) for 24 hours prior to your procedure.  If you use a CPAP at night, you may bring your mask/headgear for your overnight stay.   Contacts, glasses, piercing's, hearing  aid's, dentures or partials may not be worn into surgery, please bring cases for these belongings.    For patients admitted to the hospital, discharge time will be determined by your treatment team.   Patients discharged the day of surgery will not be allowed to drive home, and someone needs to stay with them for 24 hours.  NO VISITORS WILL BE ALLOWED IN PRE-OP WHERE PATIENTS ARE PREPPED FOR SURGERY.  ONLY 1 SUPPORT PERSON MAY BE PRESENT IN THE WAITING ROOM WHILE YOU ARE IN SURGERY.  IF YOU ARE TO BE ADMITTED, ONCE YOU ARE IN YOUR ROOM YOU WILL BE ALLOWED TWO (2) VISITORS. (1) VISITOR MAY STAY OVERNIGHT BUT MUST ARRIVE TO THE ROOM BY 8pm.  Minor children may have two parents present. Special consideration for safety and communication needs will be reviewed on a case by case basis.   Special instructions:   [facility]- Preparing For Surgery  Before surgery, you can play an important role. Because skin is not sterile, your skin needs to be as free of germs as possible. You can reduce the number of germs on your skin by washing with CHG (chlorahexidine gluconate) Soap before surgery.  CHG is an antiseptic cleaner which kills germs and bonds with the skin to continue killing germs even after washing.    Oral Hygiene is also important to reduce your risk of infection.  Remember - BRUSH YOUR TEETH THE MORNING OF SURGERY WITH YOUR REGULAR TOOTHPASTE  Please do not use if you have an allergy to CHG or antibacterial soaps. If your skin becomes reddened/irritated stop using the CHG.  Do not shave (including legs and underarms) for at least 48 hours prior to first CHG shower. It is OK to shave your face.  Please follow these instructions carefully.   Shower the NIGHT BEFORE SURGERY and the MORNING OF SURGERY  If you chose to wash your hair, wash your hair first as usual with your normal shampoo.  After you shampoo, rinse your hair and body thoroughly to remove the shampoo.  Use CHG Soap as you would  any other liquid soap. You can apply CHG directly to the skin and wash gently with a scrungie or a clean washcloth.   Apply the CHG Soap to your body ONLY FROM THE NECK DOWN.  Do not use on open wounds or open sores. Avoid contact with your eyes, ears, mouth and genitals (private parts). Wash Face and genitals (private parts)  with your normal soap.   Wash thoroughly, paying special attention to the area where your surgery will be performed.  Thoroughly rinse your body with warm water from the neck down.  DO NOT shower/wash with your normal soap after using and rinsing off the CHG Soap.  Pat yourself dry with a CLEAN TOWEL.  Wear CLEAN PAJAMAS to bed the night before surgery  Place CLEAN SHEETS on your bed the night before your surgery  DO NOT SLEEP WITH PETS.   Day of Surgery: Shower with CHG soap. Do not wear jewelry. Do not wear lotions, powders, colognes, or deodorant. Do not shave 48 hours prior to surgery.  Men may shave face and neck. Do not bring valuables to the hospital. [facility] is not responsible for any belongings or valuables. Wear Clean/Comfortable clothing the morning of surgery Remember to brush your teeth WITH YOUR REGULAR TOOTHPASTE.   Please read over the following fact sheets that you were given.   3 days prior to your procedure or After your COVID test   You are not required to quarantine however you are required to wear a well-fitting mask when you are out and around people not in your household. If your mask becomes wet or soiled, replace with a new one.   Wash your hands often with soap and water for 20 seconds or clean your hands with an alcohol-based hand sanitizer that contains at least 60% alcohol.   Do not share personal items.   Notify your provider:  o if you are in close contact with someone who has COVID  o or if you develop a fever of 100.4 or greater, sneezing, cough, sore throat, shortness of breath or body aches.

## 2021-10-15 ENCOUNTER — Other Ambulatory Visit: Payer: Self-pay

## 2021-10-15 ENCOUNTER — Encounter (HOSPITAL_COMMUNITY)
Admission: RE | Admit: 2021-10-15 | Discharge: 2021-10-15 | Disposition: A | Discharge status: Home / Self Care | Payer: Medicare HMO | Source: Ambulatory Visit | Attending: Orthopaedic Surgery | Admitting: Orthopaedic Surgery

## 2021-10-15 VITALS — BP 119/96 | HR 71 | Temp 97.7°F | Resp 17 | Ht 78.5 in | Wt 203.4 lb

## 2021-10-15 DIAGNOSIS — Z8673 Personal history of transient ischemic attack (TIA), and cerebral infarction without residual deficits: Secondary | ICD-10-CM | POA: Insufficient documentation

## 2021-10-15 DIAGNOSIS — Z20822 Contact with and (suspected) exposure to covid-19: Secondary | ICD-10-CM | POA: Diagnosis not present

## 2021-10-15 DIAGNOSIS — I700 Atherosclerosis of aorta: Secondary | ICD-10-CM | POA: Diagnosis not present

## 2021-10-15 DIAGNOSIS — D649 Anemia, unspecified: Secondary | ICD-10-CM | POA: Diagnosis not present

## 2021-10-15 DIAGNOSIS — I4891 Unspecified atrial fibrillation: Secondary | ICD-10-CM | POA: Insufficient documentation

## 2021-10-15 DIAGNOSIS — F17210 Nicotine dependence, cigarettes, uncomplicated: Secondary | ICD-10-CM | POA: Insufficient documentation

## 2021-10-15 DIAGNOSIS — M1611 Unilateral primary osteoarthritis, right hip: Secondary | ICD-10-CM | POA: Diagnosis not present

## 2021-10-15 DIAGNOSIS — I10 Essential (primary) hypertension: Secondary | ICD-10-CM | POA: Diagnosis not present

## 2021-10-15 DIAGNOSIS — I7121 Aneurysm of the ascending aorta, without rupture: Secondary | ICD-10-CM | POA: Diagnosis not present

## 2021-10-15 DIAGNOSIS — Z01812 Encounter for preprocedural laboratory examination: Secondary | ICD-10-CM | POA: Diagnosis not present

## 2021-10-15 DIAGNOSIS — Z01818 Encounter for other preprocedural examination: Secondary | ICD-10-CM

## 2021-10-15 HISTORY — DX: Unspecified atrial fibrillation: I48.91

## 2021-10-15 HISTORY — DX: Bradycardia, unspecified: R00.1

## 2021-10-15 LAB — COMPREHENSIVE METABOLIC PANEL
ALT: 14 U/L (ref 0–44)
AST: 18 U/L (ref 15–41)
Albumin: 4.1 g/dL (ref 3.5–5.0)
Alkaline Phosphatase: 126 U/L (ref 38–126)
Anion gap: 7 (ref 5–15)
BUN: 11 mg/dL (ref 8–23)
CO2: 25 mmol/L (ref 22–32)
Calcium: 9.3 mg/dL (ref 8.9–10.3)
Chloride: 104 mmol/L (ref 98–111)
Creatinine, Ser: 1.12 mg/dL (ref 0.61–1.24)
GFR, Estimated: >60 mL/min (ref >60)
Glucose, Bld: 99 mg/dL (ref 70–99)
Potassium: 4.1 mmol/L (ref 3.5–5.1)
Sodium: 136 mmol/L (ref 135–145)
Total Bilirubin: 1.5 mg/dL — ABNORMAL HIGH (ref 0.3–1.2)
Total Protein: 7.3 g/dL (ref 6.5–8.1)

## 2021-10-15 LAB — CBC WITH DIFFERENTIAL/PLATELET
Abs Immature Granulocytes: 0.01 10*3/uL (ref 0.00–0.07)
Basophils Absolute: 0 10*3/uL (ref 0.0–0.1)
Basophils Relative: 1 %
Eosinophils Absolute: 0.1 10*3/uL (ref 0.0–0.5)
Eosinophils Relative: 2 %
HCT: 42.6 % (ref 39.0–52.0)
Hemoglobin: 14 g/dL (ref 13.0–17.0)
Immature Granulocytes: 0 %
Lymphocytes Relative: 32 %
Lymphs Abs: 1.8 10*3/uL (ref 0.7–4.0)
MCH: 31.8 pg (ref 26.0–34.0)
MCHC: 32.9 g/dL (ref 30.0–36.0)
MCV: 96.8 fL (ref 80.0–100.0)
Monocytes Absolute: 0.5 10*3/uL (ref 0.1–1.0)
Monocytes Relative: 9 %
Neutro Abs: 3.2 10*3/uL (ref 1.7–7.7)
Neutrophils Relative %: 56 %
Platelets: 277 10*3/uL (ref 150–400)
RBC: 4.4 MIL/uL (ref 4.22–5.81)
RDW: 14.3 % (ref 11.5–15.5)
WBC: 5.7 10*3/uL (ref 4.0–10.5)
nRBC: 0 % (ref 0.0–0.2)

## 2021-10-15 LAB — SURGICAL PCR SCREEN
MRSA, PCR: NEGATIVE
Staphylococcus aureus: NEGATIVE

## 2021-10-15 NOTE — Progress Notes (Signed)
PCP - Dr. [name] Cardiologist - Dr. Buford Dresser  PPM/ICD - Denies  Chest x-ray - N/A EKG - 06/10/21 Stress Test - Denies ECHO - 06/02/21 Cardiac Cath - Denies  Sleep Study - Denies  Patient is not diabetic.  Blood Thinner Instructions: N/A Aspirin Instructions: N/A  ERAS Protcol - Yes, PRE-SURGERY Ensure  COVID TEST- 10/15/21 in PAT   Anesthesia review: Yes, cardiac hx  Patient denies shortness of breath, fever, cough and chest pain at PAT appointment   All instructions explained to the patient, with a verbal understanding of the material. Patient agrees to go over the instructions while at home for a better understanding. Patient also instructed to self quarantine after being tested for COVID-19. The opportunity to ask questions was provided.

## 2021-10-16 ENCOUNTER — Encounter (HOSPITAL_COMMUNITY): Payer: Self-pay

## 2021-10-16 ENCOUNTER — Telehealth: Payer: Self-pay | Admitting: *Deleted

## 2021-10-16 LAB — SARS CORONAVIRUS 2 (TAT 6-24 HRS): SARS Coronavirus 2: NEGATIVE

## 2021-10-16 MED ORDER — TRANEXAMIC ACID 1000 MG/10ML IV SOLN
2000.0000 mg | INTRAVENOUS | Status: AC
  Administered 2021-10-19: 2000 mg via TOPICAL
  Filled 2021-10-16: qty 20

## 2021-10-16 NOTE — Telephone Encounter (Signed)
° °  Pre-operative Risk Assessment    Patient Name: Larry Reese  DOB: 12-20-1945 MRN: [id number]      Request for Surgical Clearance    Procedure:   RIGHT TOTAL HIP ARTHROPLASTY  Date of Surgery:  Clearance TBD                                 Surgeon:  DR. Marylynn Pearson. [facility] Sur[facility]'s Group or Practice Name:  [facility] Phone number:  [phone] Fax number:  [phone]   Type of Clearance Requested:   - Medical  - Pharmacy:  Hold Aspirin     Type of Anesthesia:  Spinal   Additional requests/questions:    Jiles Prows   10/16/2021, 12:12 PM

## 2021-10-16 NOTE — Anesthesia Preprocedure Evaluation (Addendum)
Anesthesia Evaluation  Patient identified by MRN, date of birth, ID band Patient awake    Reviewed: Allergy & Precautions, NPO status , Patient's Chart, lab work & pertinent test results  Airway Mallampati: I  TM Distance: >3 FB Neck ROM: Full    Dental no notable dental hx. (+) Teeth Intact, Dental Advisory Given, Edentulous Upper, Poor Dentition, Missing,    Pulmonary neg pulmonary ROS, Current Smoker and Patient abstained from smoking.,    Pulmonary exam normal breath sounds clear to auscultation       Cardiovascular hypertension, Pt. on medications + Peripheral Vascular Disease  Normal cardiovascular exam+ dysrhythmias Atrial Fibrillation  Rhythm:Regular Rate:Normal  9/22 ECHO  1. Left ventricular ejection fraction, by estimation, is 60 to 65%. The  left ventricle has normal function. The left ventricle has no regional  wall motion abnormalities. Left ventricular diastolic function could not  be evaluated.  2. Right ventricular systolic function is normal. The right ventricular  size is normal. There is normal pulmonary artery systolic pressure. The  estimated right ventricular systolic pressure is 16.0 mmHg.  3. Left atrial size was mildly dilated.  4. The mitral valve is normal in structure. Trivial mitral valve  regurgitation. No evidence of mitral stenosis.  5. The aortic valve is tricuspid. Aortic valve regurgitation is not  visualized. No aortic stenosis is present.  6. Aortic dilatation noted. There is mild dilatation of the aortic root,  measuring 40 mm.  7. The inferior vena cava is normal in size with greater than 50%  respiratory variability, suggesting right atrial pressure of 3 mmHg.    Neuro/Psych CVA, Residual Symptoms negative neurological ROS  negative psych ROS   GI/Hepatic negative GI ROS, (+)     substance abuse  alcohol use, Hepatitis -, Unspecified  Endo/Other  negative endocrine ROS   Renal/GU negative Renal ROS  negative genitourinary   Musculoskeletal negative musculoskeletal ROS (+) Arthritis , Osteoarthritis,    Abdominal   Peds negative pediatric ROS (+)  Hematology negative hematology ROS (+) anemia ,   Anesthesia Other Findings   Reproductive/Obstetrics negative OB ROS                           Anesthesia Physical Anesthesia Plan  ASA: 4  Anesthesia Plan: MAC and Spinal   Post-op Pain Management:    Induction: Intravenous  PONV Risk Score and Plan: 1 and Ondansetron  Airway Management Planned: Natural Airway, Nasal Cannula, Simple Face Mask and Mask  Additional Equipment: None  Intra-op Plan:   Post-operative Plan: Extubation in OR  Informed Consent:   Plan Discussed with: Anesthesiologist and CRNA  Anesthesia Plan Comments: (PAT note written 10/16/2021 by Larry Gianotti, PA-C.   DISCUSSION: Patient is a 76 year old male scheduled for the above procedure. Surgery was initially planned for 05/05/20, but his preoperative EKG showed new onset afib. He had been cleared to proceed with surgery in 05/2020, but delayed again due to Larry Reese and/or desire to proceed with Watchman device implant and concern for long term anticoagulation given history for non-traumatic SDH. S/p Watchman deice 09/04/20.  History includes smoking, HTN, afib (diagnosed 05/05/20, s/p Watchman LAA occlusive device 09/04/20), bradycardia, CVA (2005, left hemiparesis), anemia, spontaneous right pneumothorax (09/18/98), SDH (left chronic SDH versus hygroma 10/27/04, admitted to [facility]; history of craniotomy), hernia (s/p right IHR 08/18/05). Reported history of CVA with left hemiparesis ~ 2005, unclear if this is referring to Larry Reese 2006. Of note, he has  had dilated ascending thoracic aorta dating back to at least 2018--max 4.8 cm on 08/11/17 (results discussed with patient then by ED provider with f/u advised) and 07/21/20 chest CT scans; 48 mm with severe (grade  IV) protruding plaque involving the descending aorta 10/13/20 TEE, and measurement of 4.0 cm on 06/02/21 TTE.   He has a medical clearance letter from Larry Meiers, MD.  Last cardiology visit with Larry Reese was on 07/01/21. Afib persistent. Monitoring bradycardia. HTN at goal. Encouraged reconsideration of statin for HLD. Encouraged to regular, moderate walking activity. Six month follow-up planned. Last EP visit 03/10/21, and Plavix discontinued. Preoperative cardiology input outlined on 10/16/21 by Larry Kail, PA, "...Larry Reese last seen on 10/12/22by Larry Reese. Since that day, Larry Reese done well.  Therefore, based on ACC/AHA guidelines, the patient would be at acceptable risk for the planned procedure without further cardiovascular testing..."  Chart discussed with anesthesiologist Larry Gammon, MD. Anesthesia team to evaluate on the day of surgery.  )       Anesthesia Quick Evaluation

## 2021-10-16 NOTE — Telephone Encounter (Signed)
° °  Name: Larry Reese  DOB: January 15, 1946  MRN: [id number]   Primary Cardiologist: Buford Dresser, MD  Chart reviewed as part of pre-operative protocol coverage. Patient was contacted 10/16/2021 in reference to pre-operative risk assessment for pending surgery as outlined below.  RYLIE KNIERIM was last seen on 07/01/21 by Dr. Harrell Gave.  Since that day, RAMAJ FRANGOS has done well.  Therefore, based on ACC/AHA guidelines, the patient would be at acceptable risk for the planned procedure without further cardiovascular testing.   The patient was advised that if he develops new symptoms prior to surgery to contact our office to arrange for a follow-up visit, and he verbalized understanding.   Patient takes aspirin for PAD. Dr. Harrell Gave, can patient hold aspirin? Please forward your response to P CV DIV PREOP.   Thank you   Leanor Kail, PA 10/16/2021, 12:40 PM

## 2021-10-16 NOTE — Progress Notes (Addendum)
Anesthesia Chart Review:  Case: [id number] Date/Time: 10/19/21 0948   Procedure: RIGHT TOTAL HIP ARTHROPLASTY ANTERIOR APPROACH (Right: Hip) - 3-C   Anesthesia type: Spinal   Pre-op diagnosis: RIGHT HIP DEGENERATIVE JOINT DISEASE   Location: [place] OR ROOM 04 / [place] [facility]   Surgeons: Leandrew Koyanagi, MD       DISCUSSION: Patient is a 76 year old male scheduled for the above procedure. Surgery was initially planned for 05/05/20, but his preoperative EKG showed new onset afib. He had been cleared to proceed with surgery in 05/2020, but delayed again due to [place] and/or desire to proceed with Watchman device implant and concern for long term anticoagulation given history for non-traumatic SDH. S/p Watchman deice 09/04/20.  History includes smoking, HTN, afib (diagnosed 05/05/20, s/p Watchman LAA occlusive device 09/04/20), bradycardia, CVA (2005, left hemiparesis), anemia, spontaneous right pneumothorax (09/18/98), SDH (left chronic SDH versus hygroma 10/27/04, admitted to [facility]; history of craniotomy), hernia (s/p right IHR 08/18/05). Reported history of CVA with left hemiparesis ~ 2005, unclear if this is referring to [place] 2006. Of note, he has had dilated ascending thoracic aorta dating back to at least 2018--max 4.8 cm on 08/11/17 (results discussed with patient then by ED provider with f/u advised) and 07/21/20 chest CT scans; 48 mm with severe (grade IV) protruding plaque involving the descending aorta 10/13/20 TEE, and measurement of 4.0 cm on 06/02/21 TTE.   He has a medical clearance letter from Carrolyn Meiers, MD.  Last cardiology visit with Dr. Harrell Gave was on 07/01/21. Afib persistent. Monitoring bradycardia. HTN at goal. Encouraged reconsideration of statin for HLD. Encouraged to regular, moderate walking activity. Six month follow-up planned. Last EP visit 03/10/21, and Plavix discontinued. Preoperative cardiology input outlined on 10/16/21 by Leanor Kail, PA, "...NA[facility]YS was last  seen on 07/01/21 by Dr. Harrell Gave.  Since that day, TRIGGER FRASIER has done well.   Therefore, based on ACC/AHA guidelines, the patient would be at acceptable risk for the planned procedure without further cardiovascular testing..."  Chart discussed with anesthesiologist Tamela Gammon, MD. Anesthesia team to evaluate on the day of surgery.    VS: BP (!) 119/96    Pulse 71    Temp 36.5 C    Resp 17    Ht 6' 6.5" (1.994 m)    Wt 92.3 kg    SpO2 96%    BMI 23.21 kg/m    PROVIDERS: Carrolyn Meiers, MD is PCP  Buford Dresser, MD is cardiologist Lars Mage, MD is EP cardiologist. Last visit 03/10/21 with Piedad Climes, PA-C. Plavix discontinued. ASA 81 mg continued.  Six month follow-up recommended. I've reached out to Dr. Harrell Gave and Dr. Quentin Ore to inquire if they can facilitate TAA follow-up, otherwise will plan to reach out to Dr. Legrand Rams. Dr. Harrell Gave did not think TAA needed urgent referral to CT Surgery given stable imaging, but would need to monitoring over time.    LABS: Labs reviewed: Acceptable for surgery. (all labs ordered are listed, but only abnormal results are displayed)  Labs Reviewed  COMPREHENSIVE METABOLIC PANEL - Abnormal; Notable for the following components:      Result Value   Total Bilirubin 1.5 (*)    All other components within normal limits  SARS CORONAVIRUS 2 (TAT 6-24 HRS)  SURGICAL PCR SCREEN  CBC WITH DIFFERENTIAL/PLATELET    IMAGES: MRI Right Femur 08/11/21 (ordered by Dr. Erlinda Hong): IMPRESSION: 1. 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  2. Similar findings posterior to the left hip. 3. Indeterminate process within the left gluteus musculature, only imaged on the coronal sequences and not included on the postcontrast sequences. Based on radiographic appearance, this is likely an area of more recent heterotopic ossification. Consider follow-up by pelvic CT in  6-12 months as clinically warranted. 4. Right hip osteoarthritis.   CXR 05/21/21: FINDINGS: The heart size and mediastinal contours are within normal limits. Stable bibasilar scarring is noted. No acute abnormality is noted. The visualized skeletal structures are unremarkable. IMPRESSION: Stable bibasilar scarring.  No acute abnormality is noted.   CT Chest (as part of CT Cardiac 07/21/20): IMPRESSION: 1. Aortic atherosclerosis with aneurysmal dilatation of the ascending thoracic aorta (4.8 cm in diameter). Ascending thoracic aortic aneurysm. Recommend semi-annual imaging followup by CTA or MRA and referral to cardiothoracic surgery if not already obtained. This recommendation follows 2010 ACCF/AHA/AATS/ACR/ASA/SCA/SCAI/SIR/STS/SVM Guidelines for the Diagnosis and Management of Patients With Thoracic Aortic Disease. Circulation. 2010; 121: P619-J093. Aortic aneurysm NOS (ICD10-I71.9). 2. Areas of bronchiectasis and post infectious or inflammatory scarring in the lower lobes of the lungs bilaterally, as above. Aortic Atherosclerosis (ICD10-I70.0). Aortic aneurysm NOS (ICD10-I71.9).    EKG: 05/21/21: Afib at 54 bpm. Septal infarct (age undetermined).   CV: Echo 06/02/21: IMPRESSIONS   1. Left ventricular ejection fraction, by estimation, is 60 to 65%. The  left ventricle has normal function. The left ventricle has no regional  wall motion abnormalities. Left ventricular diastolic function could not  be evaluated.   2. Right ventricular systolic function is normal. The right ventricular  size is normal. There is normal pulmonary artery systolic pressure. The  estimated right ventricular systolic pressure is 26.7 mmHg.   3. Left atrial size was mildly dilated.   4. The mitral valve is normal in structure. Trivial mitral valve  regurgitation. No evidence of mitral stenosis.   5. The aortic valve is tricuspid. Aortic valve regurgitation is not  visualized. No aortic stenosis is  present.   6. Aortic dilatation noted. There is mild dilatation of the aortic root,  measuring 40 mm. [Ao Root diam: 3.80 cm; Ao Asc diam: 4.00 cm]  7. The inferior vena cava is normal in size with greater than 50%  respiratory variability, suggesting right atrial pressure of 3 mmHg.  - Comparison(s): No significant change from prior study.  - Conclusion(s)/Recommendation(s): Otherwise normal echocardiogram, with  minor abnormalities described in the report.    14 day Xio XT cardiac monitor 05/21/21: Patch Wear Time:  14 days and 0 hours Atrial Fibrillation occurred continuously (100% burden), ranging from 31-114 bpm (avg of 63 bpm). 1 Pause occurred lasting 3 secs (20 bpm) on 05/22/21 at 3:44 AM. Isolated VEs were rare (<1.0%). There was 1 triggered event, which was atrial fibrillation.   TEE 10/13/20: IMPRESSIONS   1. A 31 mm Watchman FLX is present in the LAA. Max diameter 27.9 mm (10%  compression). No device related thrombus or device leak. Left atrial size  was mildly dilated. No left atrial/left atrial appendage thrombus was  detected.   2. Aorta measurements comparable to recent CT. Aneurysm of the ascending  aorta, measuring 48 mm. There is Severe (Grade IV) protruding plaque  involving the descending aorta.   3. Left ventricular ejection fraction, by estimation, is 60 to 65%. The  left ventricle has normal function. The left ventricle has no regional  wall motion abnormalities.   4. Right ventricular systolic function is normal. The right ventricular  size is normal.  5. The mitral valve is grossly normal. Mild mitral valve regurgitation.  No evidence of mitral stenosis.   6. The aortic valve is tricuspid. Aortic valve regurgitation is not  visualized. No aortic stenosis is present.    EP Procedure 09/04/20: PROCEDURES:  1. Transseptal puncture 2. Transesophageal echocardiogram 3. Left atrial appendage occlusive device placement.   CT Cardiac (Pre-Watchman Device)  07/21/20: IMPRESSION: 1.  Moderate RAE, mild LAE with no LAA thrombus 2. Chicken wing morphology LAA suitable for a 27 mm Watchman FLX with 12.6% expected compression 3.  Normal PV anatomy with no anomaly 4.  Normal atrial septum with no PFO or lipomatous hypertrophy 5.  No pericardial effusion 6.  Optimum working angle to view LAA ostium RAO 31 Caudal 9 degrees 7. Suggest anterior curve sheath given anteriorly directed appendage along LV free wall 8. Moderate ascending aortic root dilatation 4.7 cm in orthogonal planes 9.  Suggest posterior mid fossa trans-septal puncture   Past Medical History:  Diagnosis Date   A-fib ([place])    Anemia    Arthritis    Bradycardia    Dupuytren's contracture of right hand    Hx of adenomatous colonic polyps    Hypertension    Pneumothorax    SDH (subdural hematoma) 10/27/2004   left chronic SDH versus hygroma 10/27/04 CT   Stroke [facility]) 2005   left sided weakness   Trigger finger     Past Surgical History:  Procedure Laterality Date   COLONOSCOPY  May 2010   Dr. fields: 5 cm terminal ileum normal, 6 polyps removed, moderate internal hemorrhoids, simple adenomas, next colonoscopy May 2015   COLONOSCOPY N/A 08/01/2015   Procedure: COLONOSCOPY;  Surgeon: Danie Binder, MD;  [facility];  Service: Endoscopy;  Laterality: N/A;  0830   CRANIOTOMY  2000   pressure   INGUINAL HERNIA REPAIR Right    LEFT ATRIAL APPENDAGE OCCLUSION N/A 09/04/2020   Procedure: LEFT ATRIAL APPENDAGE OCCLUSION;  Surgeon: Vickie Epley, MD;  Location: [place] CV LAB;  Service: Cardiovascular;  Laterality: N/A;   OLECRANON BURSECTOMY Right 02/11/2015   Procedure: EXCISION RIGHT OLECRANON BURSA;  Surgeon: Sanjuana Kava, MD;  Location: [facility] ORS;  Service: Orthopedics;  Laterality: Right;   repair of trigger finger Left    pinky   TEE WITHOUT CARDIOVERSION N/A 09/04/2020   Procedure: TRANSESOPHAGEAL ECHOCARDIOGRAM (TEE);  Surgeon: Vickie Epley, MD;   Location: [place] CV LAB;  Service: Cardiovascular;  Laterality: N/A;   TEE WITHOUT CARDIOVERSION N/A 10/13/2020   Procedure: TRANSESOPHAGEAL ECHOCARDIOGRAM (TEE);  Surgeon: Geralynn Rile, MD;  Location: [place];  Service: Cardiovascular;  Laterality: N/A;    MEDICATIONS:  aspirin EC 81 MG tablet   docusate sodium (COLACE) 100 MG capsule   methocarbamol (ROBAXIN) 500 MG tablet   ondansetron (ZOFRAN) 4 MG tablet   oxyCODONE (ROXICODONE) 5 MG immediate release tablet   amLODipine (NORVASC) 10 MG tablet   cholecalciferol (VITAMIN D3) 25 MCG (1000 UNIT) tablet   diclofenac sodium (VOLTAREN) 1 % GEL   HYDROcodone-acetaminophen (NORCO) 7.5-325 MG tablet   tamsulosin (FLOMAX) 0.4 MG CAPS capsule   No current facility-administered medications for this encounter.    [START ON 10/19/2021] tranexamic acid (CYKLOKAPRON) 2,000 mg in sodium chloride 0.9 % 50 mL Topical Application    Myra Gianotti, PA-C Surgical Short Stay/Anesthesiology [facility] Phone [phone] [facility] Phone [phone] 10/16/2021 4:52 PM

## 2021-10-18 ENCOUNTER — Telehealth: Payer: Self-pay | Admitting: *Deleted

## 2021-10-18 NOTE — Telephone Encounter (Signed)
Ortho bundle pre-op call completed. 

## 2021-10-18 NOTE — Care Plan (Signed)
[place] office RNCM call to patient to discuss his upcoming Right total hip arthroplasty on 10/19/21 with Dr. Erlinda Hong. He is an Ortho bundle through [facility] and is agreeable to case management. He lives with his spouse, who will be assisting after discharge. He thinks he has a RW and will be locating this in his home tonight to ensure he has, otherwise, CM will help order and provide at hospital. He does have a 3in1/BSC in his home already. Anticipate HHPT will be needed after short hospital stay. Referral for [facility] after choice provided. Reviewed all post op questions. Will continue to follow for needs.

## 2021-10-19 ENCOUNTER — Ambulatory Visit (HOSPITAL_COMMUNITY): Payer: Medicare HMO | Admitting: Vascular Surgery

## 2021-10-19 ENCOUNTER — Ambulatory Visit (HOSPITAL_COMMUNITY): Payer: Medicare HMO

## 2021-10-19 ENCOUNTER — Observation Stay (HOSPITAL_COMMUNITY)
Admission: RE | Admit: 2021-10-19 | Discharge: 2021-10-21 | Disposition: A | Discharge status: Home / Self Care | Payer: Medicare HMO | Attending: Orthopaedic Surgery | Admitting: Orthopaedic Surgery

## 2021-10-19 ENCOUNTER — Encounter (HOSPITAL_COMMUNITY)
Admission: RE | Disposition: A | Discharge status: Home / Self Care | Payer: Self-pay | Source: Home / Self Care | Attending: Orthopaedic Surgery

## 2021-10-19 ENCOUNTER — Other Ambulatory Visit: Payer: Self-pay

## 2021-10-19 ENCOUNTER — Observation Stay (HOSPITAL_COMMUNITY): Payer: Medicare HMO

## 2021-10-19 ENCOUNTER — Ambulatory Visit (HOSPITAL_COMMUNITY): Payer: Medicare HMO | Admitting: Anesthesiology

## 2021-10-19 ENCOUNTER — Encounter (HOSPITAL_COMMUNITY): Payer: Self-pay | Admitting: Orthopaedic Surgery

## 2021-10-19 DIAGNOSIS — Z79899 Other long term (current) drug therapy: Secondary | ICD-10-CM | POA: Diagnosis not present

## 2021-10-19 DIAGNOSIS — I10 Essential (primary) hypertension: Secondary | ICD-10-CM | POA: Insufficient documentation

## 2021-10-19 DIAGNOSIS — Z96641 Presence of right artificial hip joint: Secondary | ICD-10-CM | POA: Diagnosis not present

## 2021-10-19 DIAGNOSIS — R52 Pain, unspecified: Secondary | ICD-10-CM

## 2021-10-19 DIAGNOSIS — M87051 Idiopathic aseptic necrosis of right femur: Secondary | ICD-10-CM | POA: Diagnosis not present

## 2021-10-19 DIAGNOSIS — D649 Anemia, unspecified: Secondary | ICD-10-CM | POA: Diagnosis not present

## 2021-10-19 DIAGNOSIS — M1611 Unilateral primary osteoarthritis, right hip: Secondary | ICD-10-CM | POA: Diagnosis not present

## 2021-10-19 DIAGNOSIS — Z471 Aftercare following joint replacement surgery: Secondary | ICD-10-CM | POA: Diagnosis not present

## 2021-10-19 DIAGNOSIS — I739 Peripheral vascular disease, unspecified: Secondary | ICD-10-CM | POA: Diagnosis not present

## 2021-10-19 DIAGNOSIS — Z7982 Long term (current) use of aspirin: Secondary | ICD-10-CM | POA: Insufficient documentation

## 2021-10-19 DIAGNOSIS — Z96649 Presence of unspecified artificial hip joint: Secondary | ICD-10-CM

## 2021-10-19 DIAGNOSIS — Z9889 Other specified postprocedural states: Secondary | ICD-10-CM | POA: Diagnosis not present

## 2021-10-19 DIAGNOSIS — M19071 Primary osteoarthritis, right ankle and foot: Secondary | ICD-10-CM | POA: Diagnosis not present

## 2021-10-19 DIAGNOSIS — Z419 Encounter for procedure for purposes other than remedying health state, unspecified: Secondary | ICD-10-CM

## 2021-10-19 DIAGNOSIS — F17210 Nicotine dependence, cigarettes, uncomplicated: Secondary | ICD-10-CM | POA: Insufficient documentation

## 2021-10-19 HISTORY — PX: TOTAL HIP ARTHROPLASTY: SHX124

## 2021-10-19 SURGERY — ARTHROPLASTY, HIP, TOTAL, ANTERIOR APPROACH
Anesthesia: Monitor Anesthesia Care | Site: Hip | Laterality: Right

## 2021-10-19 MED ORDER — CEFAZOLIN SODIUM-DEXTROSE 2-4 GM/100ML-% IV SOLN
2.0000 g | Freq: Four times a day (QID) | INTRAVENOUS | Status: AC
  Administered 2021-10-19 – 2021-10-20 (×2): 2 g via INTRAVENOUS
  Filled 2021-10-19 (×2): qty 100

## 2021-10-19 MED ORDER — LACTATED RINGERS IV SOLN: INTRAVENOUS | Status: DC

## 2021-10-19 MED ORDER — PHENYLEPHRINE HCL-NACL 20-0.9 MG/250ML-% IV SOLN
INTRAVENOUS | Status: DC | PRN
Start: 2021-10-19 — End: 2021-10-19
  Administered 2021-10-19: 30 ug/min via INTRAVENOUS

## 2021-10-19 MED ORDER — METOCLOPRAMIDE HCL 5 MG PO TABS: 5.0000 mg | ORAL_TABLET | Freq: Three times a day (TID) | ORAL | Status: DC | PRN

## 2021-10-19 MED ORDER — ONDANSETRON HCL 4 MG PO TABS: 4.0000 mg | ORAL_TABLET | Freq: Four times a day (QID) | ORAL | Status: DC | PRN

## 2021-10-19 MED ORDER — MENTHOL 3 MG MT LOZG: 1.0000 | LOZENGE | OROMUCOSAL | Status: DC | PRN

## 2021-10-19 MED ORDER — OXYCODONE HCL 5 MG PO TABS: 10.0000 mg | ORAL_TABLET | ORAL | Status: DC | PRN

## 2021-10-19 MED ORDER — BUPIVACAINE LIPOSOME 1.3 % IJ SUSP
INTRAMUSCULAR | Status: DC | PRN
  Administered 2021-10-19: 20 mL

## 2021-10-19 MED ORDER — ONDANSETRON HCL 4 MG/2ML IJ SOLN
INTRAMUSCULAR | Status: DC | PRN
  Administered 2021-10-19: 4 mg via INTRAVENOUS

## 2021-10-19 MED ORDER — ORAL CARE MOUTH RINSE: 15.0000 mL | Freq: Once | OROMUCOSAL | Status: AC

## 2021-10-19 MED ORDER — PROPOFOL 1000 MG/100ML IV EMUL
INTRAVENOUS | Status: AC
  Filled 2021-10-19: qty 100

## 2021-10-19 MED ORDER — OXYCODONE HCL 5 MG PO TABS: 5.0000 mg | ORAL_TABLET | Freq: Once | ORAL | Status: DC | PRN

## 2021-10-19 MED ORDER — DOCUSATE SODIUM 100 MG PO CAPS
100.0000 mg | ORAL_CAPSULE | Freq: Two times a day (BID) | ORAL | Status: DC
  Administered 2021-10-19 – 2021-10-21 (×4): 100 mg via ORAL
  Filled 2021-10-19 (×4): qty 1

## 2021-10-19 MED ORDER — METOCLOPRAMIDE HCL 5 MG/ML IJ SOLN: 5.0000 mg | Freq: Three times a day (TID) | INTRAMUSCULAR | Status: DC | PRN

## 2021-10-19 MED ORDER — MEPERIDINE HCL 25 MG/ML IJ SOLN: 6.2500 mg | INTRAMUSCULAR | Status: DC | PRN

## 2021-10-19 MED ORDER — DOCUSATE SODIUM 100 MG PO CAPS: 100.0000 mg | ORAL_CAPSULE | Freq: Two times a day (BID) | ORAL | Status: DC

## 2021-10-19 MED ORDER — ACETAMINOPHEN 500 MG PO TABS: 1000.0000 mg | ORAL_TABLET | Freq: Four times a day (QID) | ORAL | Status: DC

## 2021-10-19 MED ORDER — 0.9 % SODIUM CHLORIDE (POUR BTL) OPTIME
TOPICAL | Status: DC | PRN
  Administered 2021-10-19: 1000 mL

## 2021-10-19 MED ORDER — ALUM & MAG HYDROXIDE-SIMETH 200-200-20 MG/5ML PO SUSP: 30.0000 mL | ORAL | Status: DC | PRN

## 2021-10-19 MED ORDER — ACETAMINOPHEN 325 MG PO TABS: 325.0000 mg | ORAL_TABLET | ORAL | Status: DC | PRN

## 2021-10-19 MED ORDER — OXYCODONE HCL 5 MG/5ML PO SOLN: 5.0000 mg | Freq: Once | ORAL | Status: DC | PRN

## 2021-10-19 MED ORDER — OXYCODONE HCL ER 10 MG PO T12A
10.0000 mg | EXTENDED_RELEASE_TABLET | Freq: Two times a day (BID) | ORAL | Status: DC
  Administered 2021-10-19 – 2021-10-21 (×4): 10 mg via ORAL
  Filled 2021-10-19 (×4): qty 1

## 2021-10-19 MED ORDER — PROPOFOL 500 MG/50ML IV EMUL
INTRAVENOUS | Status: DC | PRN
  Administered 2021-10-19: 125 ug/kg/min via INTRAVENOUS

## 2021-10-19 MED ORDER — PHENOL 1.4 % MT LIQD: 1.0000 | OROMUCOSAL | Status: DC | PRN

## 2021-10-19 MED ORDER — HYDROMORPHONE HCL 1 MG/ML IJ SOLN: 0.5000 mg | INTRAMUSCULAR | Status: DC | PRN

## 2021-10-19 MED ORDER — FENTANYL CITRATE (PF) 100 MCG/2ML IJ SOLN
INTRAMUSCULAR | Status: AC
  Filled 2021-10-19: qty 2

## 2021-10-19 MED ORDER — TAMSULOSIN HCL 0.4 MG PO CAPS
0.4000 mg | ORAL_CAPSULE | Freq: Every day | ORAL | Status: DC
  Administered 2021-10-20 – 2021-10-21 (×2): 0.4 mg via ORAL
  Filled 2021-10-19 (×2): qty 1

## 2021-10-19 MED ORDER — SODIUM CHLORIDE 0.9 % IV SOLN: INTRAVENOUS | Status: DC

## 2021-10-19 MED ORDER — ONDANSETRON HCL 4 MG/2ML IJ SOLN: 4.0000 mg | Freq: Four times a day (QID) | INTRAMUSCULAR | Status: DC | PRN

## 2021-10-19 MED ORDER — FENTANYL CITRATE (PF) 100 MCG/2ML IJ SOLN
25.0000 ug | INTRAMUSCULAR | Status: DC | PRN
  Administered 2021-10-19: 25 ug via INTRAVENOUS

## 2021-10-19 MED ORDER — METHOCARBAMOL 1000 MG/10ML IJ SOLN: 500.0000 mg | Freq: Four times a day (QID) | INTRAVENOUS | Status: DC | PRN

## 2021-10-19 MED ORDER — BUPIVACAINE-MELOXICAM ER 400-12 MG/14ML IJ SOLN
INTRAMUSCULAR | Status: AC
  Filled 2021-10-19: qty 1

## 2021-10-19 MED ORDER — SORBITOL 70 % SOLN
30.0000 mL | Freq: Every day | Status: DC | PRN
  Administered 2021-10-21: 30 mL via ORAL
  Filled 2021-10-19 (×2): qty 30

## 2021-10-19 MED ORDER — GLYCOPYRROLATE 0.2 MG/ML IJ SOLN
INTRAMUSCULAR | Status: AC
  Filled 2021-10-19: qty 1

## 2021-10-19 MED ORDER — BUPIVACAINE LIPOSOME 1.3 % IJ SUSP
INTRAMUSCULAR | Status: AC
  Filled 2021-10-19: qty 20

## 2021-10-19 MED ORDER — POLYETHYLENE GLYCOL 3350 17 G PO PACK
17.0000 g | PACK | Freq: Every day | ORAL | Status: DC
  Administered 2021-10-20 – 2021-10-21 (×2): 17 g via ORAL
  Filled 2021-10-19 (×3): qty 1

## 2021-10-19 MED ORDER — METHOCARBAMOL 500 MG PO TABS: 500.0000 mg | ORAL_TABLET | Freq: Four times a day (QID) | ORAL | Status: DC | PRN

## 2021-10-19 MED ORDER — ACETAMINOPHEN 325 MG PO TABS: 325.0000 mg | ORAL_TABLET | Freq: Four times a day (QID) | ORAL | Status: DC | PRN

## 2021-10-19 MED ORDER — OXYCODONE HCL 5 MG PO TABS: 5.0000 mg | ORAL_TABLET | ORAL | Status: DC | PRN

## 2021-10-19 MED ORDER — ONDANSETRON HCL 4 MG/2ML IJ SOLN: 4.0000 mg | Freq: Once | INTRAMUSCULAR | Status: DC | PRN

## 2021-10-19 MED ORDER — EPHEDRINE 5 MG/ML INJ
INTRAVENOUS | Status: AC
  Filled 2021-10-19: qty 5

## 2021-10-19 MED ORDER — TRANEXAMIC ACID-NACL 1000-0.7 MG/100ML-% IV SOLN
1000.0000 mg | INTRAVENOUS | Status: AC
  Administered 2021-10-19: 1000 mg via INTRAVENOUS
  Filled 2021-10-19: qty 100

## 2021-10-19 MED ORDER — TRANEXAMIC ACID-NACL 1000-0.7 MG/100ML-% IV SOLN
1000.0000 mg | Freq: Once | INTRAVENOUS | Status: AC
  Administered 2021-10-19: 1000 mg via INTRAVENOUS
  Filled 2021-10-19: qty 100

## 2021-10-19 MED ORDER — CEFAZOLIN SODIUM-DEXTROSE 2-4 GM/100ML-% IV SOLN
2.0000 g | INTRAVENOUS | Status: AC
  Administered 2021-10-19: 2 g via INTRAVENOUS
  Filled 2021-10-19: qty 100

## 2021-10-19 MED ORDER — DIPHENHYDRAMINE HCL 12.5 MG/5ML PO ELIX: 25.0000 mg | ORAL_SOLUTION | ORAL | Status: DC | PRN

## 2021-10-19 MED ORDER — ASPIRIN 81 MG PO CHEW
81.0000 mg | CHEWABLE_TABLET | Freq: Two times a day (BID) | ORAL | Status: DC
  Administered 2021-10-19 – 2021-10-21 (×3): 81 mg via ORAL
  Filled 2021-10-19 (×4): qty 1

## 2021-10-19 MED ORDER — DEXAMETHASONE SODIUM PHOSPHATE 10 MG/ML IJ SOLN
10.0000 mg | Freq: Once | INTRAMUSCULAR | Status: AC
  Administered 2021-10-20: 10 mg via INTRAVENOUS
  Filled 2021-10-19: qty 1

## 2021-10-19 MED ORDER — HYDROMORPHONE HCL 1 MG/ML IJ SOLN
0.5000 mg | INTRAMUSCULAR | Status: DC | PRN
  Administered 2021-10-19: 1 mg via INTRAVENOUS
  Filled 2021-10-19: qty 1

## 2021-10-19 MED ORDER — ASPIRIN 81 MG PO CHEW: 81.0000 mg | CHEWABLE_TABLET | Freq: Two times a day (BID) | ORAL | Status: DC

## 2021-10-19 MED ORDER — KETOROLAC TROMETHAMINE 15 MG/ML IJ SOLN
INTRAMUSCULAR | Status: AC
  Filled 2021-10-19: qty 1

## 2021-10-19 MED ORDER — ACETAMINOPHEN 160 MG/5ML PO SOLN: 325.0000 mg | ORAL | Status: DC | PRN

## 2021-10-19 MED ORDER — AMLODIPINE BESYLATE 10 MG PO TABS
10.0000 mg | ORAL_TABLET | Freq: Every day | ORAL | Status: DC
  Administered 2021-10-20 – 2021-10-21 (×2): 10 mg via ORAL
  Filled 2021-10-19 (×2): qty 1

## 2021-10-19 MED ORDER — VANCOMYCIN HCL 1 G IV SOLR
INTRAVENOUS | Status: DC | PRN
  Administered 2021-10-19: 1000 mg

## 2021-10-19 MED ORDER — PANTOPRAZOLE SODIUM 40 MG PO TBEC
40.0000 mg | DELAYED_RELEASE_TABLET | Freq: Every day | ORAL | Status: DC
  Administered 2021-10-19 – 2021-10-21 (×3): 40 mg via ORAL
  Filled 2021-10-19 (×3): qty 1

## 2021-10-19 MED ORDER — OXYCODONE HCL 5 MG PO TABS
5.0000 mg | ORAL_TABLET | ORAL | Status: DC | PRN
  Administered 2021-10-20: 10 mg via ORAL

## 2021-10-19 MED ORDER — OXYCODONE HCL 5 MG PO TABS
10.0000 mg | ORAL_TABLET | ORAL | Status: DC | PRN
  Filled 2021-10-19: qty 2

## 2021-10-19 MED ORDER — METHOCARBAMOL 500 MG PO TABS
500.0000 mg | ORAL_TABLET | Freq: Four times a day (QID) | ORAL | Status: DC | PRN
Start: 2021-10-19 — End: 2021-10-21

## 2021-10-19 MED ORDER — CHLORHEXIDINE GLUCONATE 0.12 % MT SOLN
15.0000 mL | Freq: Once | OROMUCOSAL | Status: AC
  Administered 2021-10-19: 15 mL via OROMUCOSAL
  Filled 2021-10-19: qty 15

## 2021-10-19 MED ORDER — POVIDONE-IODINE 10 % EX SWAB: 2.0000 "application " | Freq: Once | CUTANEOUS | Status: DC

## 2021-10-19 MED ORDER — KETOROLAC TROMETHAMINE 15 MG/ML IJ SOLN
15.0000 mg | Freq: Four times a day (QID) | INTRAMUSCULAR | Status: DC
  Administered 2021-10-19 – 2021-10-20 (×3): 15 mg via INTRAVENOUS
  Filled 2021-10-19 (×2): qty 1

## 2021-10-19 MED ORDER — ACETAMINOPHEN 500 MG PO TABS
1000.0000 mg | ORAL_TABLET | Freq: Four times a day (QID) | ORAL | Status: AC
  Administered 2021-10-19 – 2021-10-20 (×4): 1000 mg via ORAL
  Filled 2021-10-19 (×4): qty 2

## 2021-10-19 MED ORDER — VANCOMYCIN HCL 1000 MG IV SOLR
INTRAVENOUS | Status: AC
  Filled 2021-10-19: qty 20

## 2021-10-19 MED ORDER — PRONTOSAN WOUND IRRIGATION OPTIME
TOPICAL | Status: DC | PRN
  Administered 2021-10-19: 1 via TOPICAL

## 2021-10-19 MED ORDER — CEFAZOLIN SODIUM-DEXTROSE 2-4 GM/100ML-% IV SOLN: 2.0000 g | Freq: Four times a day (QID) | INTRAVENOUS | Status: DC

## 2021-10-19 MED ORDER — SODIUM CHLORIDE 0.9 % IR SOLN
Status: DC | PRN
  Administered 2021-10-19: 1000 mL

## 2021-10-19 SURGICAL SUPPLY — 63 items
BAG COUNTER SPONGE SURGICOUNT (BAG) ×2 IMPLANT
BAG DECANTER FOR FLEXI CONT (MISCELLANEOUS) ×2 IMPLANT
CELLS DAT CNTRL 66122 CELL SVR (MISCELLANEOUS) IMPLANT
COVER PERINEAL POST (MISCELLANEOUS) ×2 IMPLANT
COVER SURGICAL LIGHT HANDLE (MISCELLANEOUS) ×2 IMPLANT
CUP GRIPTION SECTOR 62MM (Orthopedic Implant) ×1 IMPLANT
DERMABOND ADVANCED (GAUZE/BANDAGES/DRESSINGS) ×1
DERMABOND ADVANCED .7 DNX12 (GAUZE/BANDAGES/DRESSINGS) IMPLANT
DRAPE C-ARM 42X72 X-RAY (DRAPES) ×2 IMPLANT
DRAPE POUCH INSTRU U-SHP 10X18 (DRAPES) ×2 IMPLANT
DRAPE STERI IOBAN 125X83 (DRAPES) ×2 IMPLANT
DRAPE U-SHAPE 47X51 STRL (DRAPES) ×4 IMPLANT
DRESSING AQUACEL AG SP 3.5X10 (GAUZE/BANDAGES/DRESSINGS) IMPLANT
DRSG AQUACEL AG ADV 3.5X10 (GAUZE/BANDAGES/DRESSINGS) ×2 IMPLANT
DRSG AQUACEL AG SP 3.5X10 (GAUZE/BANDAGES/DRESSINGS) ×2
DURAPREP 26ML APPLICATOR (WOUND CARE) ×4 IMPLANT
ELECT BLADE 4.0 EZ CLEAN MEGAD (MISCELLANEOUS) ×2
ELECT REM PT RETURN 9FT ADLT (ELECTROSURGICAL) ×2
ELECTRODE BLDE 4.0 EZ CLN MEGD (MISCELLANEOUS) ×1 IMPLANT
ELECTRODE REM PT RTRN 9FT ADLT (ELECTROSURGICAL) ×1 IMPLANT
GLOVE BIOGEL PI IND STRL 7.5 (GLOVE) ×4 IMPLANT
GLOVE BIOGEL PI INDICATOR 7.5 (GLOVE) ×4
GLOVE SURG LTX SZ7 (GLOVE) ×4 IMPLANT
GLOVE SURG UNDER POLY LF SZ7 (GLOVE) ×4 IMPLANT
GLOVE SURG UNDER POLY LF SZ7.5 (GLOVE) ×4 IMPLANT
GOWN STRL REIN XL XLG (GOWN DISPOSABLE) ×2 IMPLANT
GOWN STRL REUS W/ TWL LRG LVL3 (GOWN DISPOSABLE) IMPLANT
GOWN STRL REUS W/ TWL XL LVL3 (GOWN DISPOSABLE) ×1 IMPLANT
GOWN STRL REUS W/TWL LRG LVL3 (GOWN DISPOSABLE)
GOWN STRL REUS W/TWL XL LVL3 (GOWN DISPOSABLE) ×2
HANDPIECE INTERPULSE COAX TIP (DISPOSABLE) ×2
HEAD CERAMIC DELTA 36 PLUS 1.5 (Hips) ×1 IMPLANT
HOOD PEEL AWAY FLYTE STAYCOOL (MISCELLANEOUS) ×4 IMPLANT
IV NS IRRIG 3000ML ARTHROMATIC (IV SOLUTION) ×2 IMPLANT
JET LAVAGE IRRISEPT WOUND (IRRIGATION / IRRIGATOR) ×2
KIT BASIN OR (CUSTOM PROCEDURE TRAY) ×2 IMPLANT
LAVAGE JET IRRISEPT WOUND (IRRIGATION / IRRIGATOR) ×1 IMPLANT
LINER NEUTRAL 62MMC36MM P4 (Liner) ×1 IMPLANT
MARKER SKIN DUAL TIP RULER LAB (MISCELLANEOUS) ×2 IMPLANT
NDL SPNL 18GX3.5 QUINCKE PK (NEEDLE) ×1 IMPLANT
NEEDLE SPNL 18GX3.5 QUINCKE PK (NEEDLE) ×2 IMPLANT
PACK TOTAL JOINT (CUSTOM PROCEDURE TRAY) ×2 IMPLANT
PACK UNIVERSAL I (CUSTOM PROCEDURE TRAY) ×2 IMPLANT
RETRACTOR WND ALEXIS 18 MED (MISCELLANEOUS) IMPLANT
RTRCTR WOUND ALEXIS 18CM MED (MISCELLANEOUS)
SAW OSC TIP CART 19.5X105X1.3 (SAW) ×2 IMPLANT
SET HNDPC FAN SPRY TIP SCT (DISPOSABLE) ×1 IMPLANT
STAPLER VISISTAT 35W (STAPLE) IMPLANT
STEM FEMORAL SZ9 STD ACTIS (Stem) ×1 IMPLANT
SUT ETHIBOND 2 V 37 (SUTURE) ×2 IMPLANT
SUT ETHILON 2 0 FS 18 (SUTURE) ×5 IMPLANT
SUT VIC AB 0 CT1 27 (SUTURE) ×2
SUT VIC AB 0 CT1 27XBRD ANBCTR (SUTURE) ×1 IMPLANT
SUT VIC AB 1 CTX 36 (SUTURE) ×2
SUT VIC AB 1 CTX36XBRD ANBCTR (SUTURE) ×1 IMPLANT
SUT VIC AB 2-0 CT1 27 (SUTURE) ×4
SUT VIC AB 2-0 CT1 TAPERPNT 27 (SUTURE) ×2 IMPLANT
SYR 50ML LL SCALE MARK (SYRINGE) ×2 IMPLANT
TOWEL GREEN STERILE (TOWEL DISPOSABLE) ×2 IMPLANT
TRAY CATH 16FR W/PLASTIC CATH (SET/KITS/TRAYS/PACK) IMPLANT
TRAY FOLEY W/BAG SLVR 16FR (SET/KITS/TRAYS/PACK) ×2
TRAY FOLEY W/BAG SLVR 16FR ST (SET/KITS/TRAYS/PACK) ×1 IMPLANT
YANKAUER SUCT BULB TIP NO VENT (SUCTIONS) ×2 IMPLANT

## 2021-10-19 NOTE — H&P (Signed)
PREOPERATIVE H&P  Chief Complaint: RIGHT HIP DEGENERATIVE JOINT DISEASE  HPI: Larry Reese is a 76 y.o. male who presents for surgical treatment of RIGHT HIP DEGENERATIVE JOINT DISEASE.  He denies any changes in medical history.  Past Medical History:  Diagnosis Date   A-fib ([place])    Anemia    Arthritis    Bradycardia    Dupuytren's contracture of right hand    Hx of adenomatous colonic polyps    Hypertension    Pneumothorax 09/18/1998   spontaneous right PTX 09/18/98, s/p chest tube   SDH (subdural hematoma) 10/27/2004   left chronic SDH versus hygroma 10/27/04 CT   Stroke [facility]) 2005   left sided weakness   Trigger finger    Past Surgical History:  Procedure Laterality Date   COLONOSCOPY  May 2010   Dr. fields: 5 cm terminal ileum normal, 6 polyps removed, moderate internal hemorrhoids, simple adenomas, next colonoscopy May 2015   COLONOSCOPY N/A 08/01/2015   Procedure: COLONOSCOPY;  Surgeon: Danie Binder, MD;  [facility];  Service: Endoscopy;  Laterality: N/A;  0830   CRANIOTOMY  2000   pressure   INGUINAL HERNIA REPAIR Right    LEFT ATRIAL APPENDAGE OCCLUSION N/A 09/04/2020   Procedure: LEFT ATRIAL APPENDAGE OCCLUSION;  Surgeon: Vickie Epley, MD;  Location: [place] CV LAB;  Service: Cardiovascular;  Laterality: N/A;   OLECRANON BURSECTOMY Right 02/11/2015   Procedure: EXCISION RIGHT OLECRANON BURSA;  Surgeon: Sanjuana Kava, MD;  Location: [facility] ORS;  Service: Orthopedics;  Laterality: Right;   repair of trigger finger Left    pinky   TEE WITHOUT CARDIOVERSION N/A 09/04/2020   Procedure: TRANSESOPHAGEAL ECHOCARDIOGRAM (TEE);  Surgeon: Vickie Epley, MD;  Location: [place] CV LAB;  Service: Cardiovascular;  Laterality: N/A;   TEE WITHOUT CARDIOVERSION N/A 10/13/2020   Procedure: TRANSESOPHAGEAL ECHOCARDIOGRAM (TEE);  Surgeon: Geralynn Rile, MD;  Location: [place];  Service: Cardiovascular;  Laterality: N/A;   Social  History   Socioeconomic History   Marital status: Married    Spouse name: Not on file   Number of children: 3   Years of education: Not on file   Highest education level: Not on file  Occupational History   Occupation: retired  Tobacco Use   Smoking status: Every Day    Packs/day: 0.25    Years: 50.00    Pack years: 12.50    Types: Cigarettes   Smokeless tobacco: Never   Tobacco comments:    5-6 cigarettes per day  Vaping Use   Vaping Use: Never used  Substance and Sexual Activity   Alcohol use: Yes    Alcohol/week: 5.0 - 6.0 standard drinks    Types: 5 - 6 Cans of beer per week    Comment: 3-4 beers/day   Drug use: No   Sexual activity: Yes    Birth control/protection: None  Other Topics Concern   Not on file  Social History Narrative   Not on file   Social Determinants of Health   Financial Resource Strain: Not on file  Food Insecurity: Not on file  Transportation Needs: Not on file  Physical Activity: Not on file  Stress: Not on file  Social Connections: Not on file   Family History  Problem Relation Age of Onset   Esophageal cancer Father    Colon cancer Neg Hx    Allergies  Allergen Reactions   Aleve [Naproxen] Other (See Comments)    Numbness in  extremities    Celecoxib Other (See Comments)    Celebrex. Nose bleed.   Fluoxetine Hcl Swelling   Prior to Admission medications   Medication Sig Start Date End Date Taking? Authorizing Provider  amLODipine (NORVASC) 10 MG tablet Take 10 mg by mouth daily.   Yes [provider]  aspirin EC 81 MG tablet Take 1 tablet (81 mg total) by mouth 2 (two) times daily. To be taken after surgery to prevent blood clots 10/12/21  Yes Aundra Dubin, PA-C  cholecalciferol (VITAMIN D3) 25 MCG (1000 UNIT) tablet Take 1,000 Units by mouth daily.   Yes [provider]  diclofenac sodium (VOLTAREN) 1 % GEL Apply 2 g topically 4 (four) times daily. Patient taking differently: Apply 2 g topically daily as  needed (pain). 08/11/17  Yes Idol, Almyra Free, PA-C  docusate sodium (COLACE) 100 MG capsule Take 1 capsule (100 mg total) by mouth daily as needed. 10/12/21 10/12/22  Aundra Dubin, PA-C  HYDROcodone-acetaminophen (NORCO) 7.5-325 MG tablet One tablet every six hours for pain.  Must last one month. Patient taking differently: Take 1 tablet by mouth every 6 (six) hours as needed for severe pain. Must last one month. 10/06/21  Yes Sanjuana Kava, MD  methocarbamol (ROBAXIN) 500 MG tablet Take 1 tablet (500 mg total) by mouth 2 (two) times daily as needed. To be taken after surgery 10/12/21   Aundra Dubin, PA-C  ondansetron (ZOFRAN) 4 MG tablet Take 1 tablet (4 mg total) by mouth every 8 (eight) hours as needed for nausea or vomiting. 10/12/21   Aundra Dubin, PA-C  oxyCODONE (ROXICODONE) 5 MG immediate release tablet Take 1-2 tabs po every 6-8 hours prn pain 10/12/21   Aundra Dubin, PA-C  tamsulosin (FLOMAX) 0.4 MG CAPS capsule Take 0.4 mg by mouth daily.  07/14/18  Yes [provider]     Positive ROS: All other systems have been reviewed and were otherwise negative with the exception of those mentioned in the HPI and as above.  Physical Exam: General: Alert, no acute distress Cardiovascular: No pedal edema Respiratory: No cyanosis, no use of accessory musculature GI: abdomen soft Skin: No lesions in the area of chief complaint Neurologic: Sensation intact distally Psychiatric: Patient is competent for consent with normal mood and affect Lymphatic: no lymphedema  MUSCULOSKELETAL: exam stable  Assessment: RIGHT HIP DEGENERATIVE JOINT DISEASE  Plan: Plan for Procedure(s): RIGHT TOTAL HIP ARTHROPLASTY ANTERIOR APPROACH  The risks benefits and alternatives were discussed with the patient including but not limited to the risks of nonoperative treatment, versus surgical intervention including infection, bleeding, nerve injury,  blood clots, cardiopulmonary complications,  morbidity, mortality, among others, and they were willing to proceed.   Preoperative templating of the joint replacement has been completed, documented, and submitted to the Operating Room personnel in order to optimize intra-operative equipment management.   Eduard Roux, MD 10/19/2021 9:05 AM

## 2021-10-19 NOTE — Progress Notes (Signed)
PT Cancellation Note  Patient Details Name: Larry Reese MRN: [id number] DOB: 20-Dec-1945   Cancelled Treatment:    Reason Eval/Treat Not Completed: Medical issues which prohibited therapy Called down to PACU to check and see if pt was appropriate, however, pt is bradycardic and not appropriate for PT. Will follow up as pt appropriate.   Lou Miner, DPT  Acute Rehabilitation Services  Pager: [phone] Office: [phone]    Rudean Hitt 10/19/2021, 3:57 PM

## 2021-10-19 NOTE — Progress Notes (Signed)
Pt arrived to floor via bed.  VSS.  No acute distress noted.

## 2021-10-19 NOTE — Discharge Instructions (Signed)

## 2021-10-19 NOTE — Plan of Care (Signed)
°  Problem: Safety: Goal: Ability to remain free from injury will improve Outcome: Progressing   Problem: Education: Goal: Knowledge of the prescribed therapeutic regimen will improve Outcome: Progressing   Problem: Activity: Goal: Ability to avoid complications of mobility impairment will improve Outcome: Progressing

## 2021-10-19 NOTE — Anesthesia Procedure Notes (Signed)
Procedure Name: MAC Date/Time: 10/19/2021 10:15 AM Performed by: Griffin Dakin, CRNA Pre-anesthesia Checklist: Patient identified, Emergency Drugs available, Suction available, Patient being monitored and Timeout performed Patient Re-evaluated:Patient Re-evaluated prior to induction Oxygen Delivery Method: Simple face mask Induction Type: IV induction Placement Confirmation: positive ETCO2 and breath sounds checked- equal and bilateral Dental Injury: Teeth and Oropharynx as per pre-operative assessment

## 2021-10-19 NOTE — Op Note (Signed)
RIGHT TOTAL HIP ARTHROPLASTY ANTERIOR APPROACH  Procedure Note DAT DERKSEN   [id number]  Pre-op Diagnosis: RIGHT HIP DEGENERATIVE JOINT DISEASE, AVN     Post-op Diagnosis: same   Operative Procedures  1. Total hip replacement; Right hip; uncemented [id number]   Surgeon: Frankey Shown, M.D.  Assist: Madalyn Rob, PA-C   Anesthesia: spinal, local  Prosthesis: Depuy Acetabulum: Pinnacle 62 mm Femur: Actis 9 STD Head: 36 mm size: +1.5 Liner: +4 Bearing Type: ceramic/poly  Total Hip Arthroplasty (Anterior Approach) Op Note:  After informed consent was obtained and the operative extremity marked in the holding area, the patient was brought back to the operating room and placed supine on the HANA table. Next, the operative extremity was prepped and draped in normal sterile fashion. Surgical timeout occurred verifying patient identification, surgical site, surgical procedure and administration of antibiotics.  A modified anterior Smith-Peterson approach to the hip was performed, using the interval between tensor fascia lata and sartorius.  Dissection was carried bluntly down onto the anterior hip capsule. The lateral femoral circumflex vessels were identified and coagulated. A capsulotomy was performed and the capsular flaps tagged for later repair.  The neck osteotomy was performed. The femoral head was removed which showed delaminated cartilage and severe degenerative wear, the acetabular rim was cleared of soft tissue and attention was turned to reaming the acetabulum.  Sequential reaming was performed under fluoroscopic guidance. We reamed to a size 61 mm, and then impacted the acetabular shell. The liner was then placed after irrigation and attention turned to the femur.  After placing the femoral hook, the leg was taken to externally rotated, extended and adducted position taking care to perform soft tissue releases to allow for adequate mobilization of the femur. Soft tissue  was cleared from the shoulder of the greater trochanter and the hook elevator used to improve exposure of the proximal femur. Sequential broaching performed up to a size 9. Trial neck and head were placed. The leg was brought back up to neutral and the construct reduced.  Antibiotic irrigation was placed in the surgical wound and kept for at least 1 minute.  The position and sizing of components, offset and leg lengths were checked using fluoroscopy. Stability of the construct was checked in extension and external rotation without any subluxation or impingement of prosthesis. We dislocated the prosthesis, dropped the leg back into position, removed trial components, and irrigated copiously. The final stem and head was then placed, the leg brought back up, the system reduced and fluoroscopy used to verify positioning.  We irrigated, obtained hemostasis and closed the capsule using #2 ethibond suture.  One gram of vancomycin powder was placed in the surgical bed.   One gram of topical tranexamic acid was injected into the joint.  The fascia was closed with #1 vicryl plus, the deep fat layer was closed with 0 vicryl, the subcutaneous layers closed with 2.0 Vicryl Plus and the skin closed with 2.0 nylon and dermabond. A sterile dressing was applied. The patient was awakened in the operating room and taken to recovery in stable condition.  All sponge, needle, and instrument counts were correct at the end of the case.   Tawanna Cooler, my PA, was a medical necessity for opening, closing, limb positioning, retracting, exposing, and overall facilitation and timely completion of the surgery.  Position: supine  Complications: see description of procedure.  Time Out: performed   Drains/Packing: none  Estimated blood loss: see anesthesia record  Returned to Recovery  Room: in good condition.   Antibiotics: yes   Mechanical VTE (DVT) Prophylaxis: sequential compression devices, TED thigh-high  Chemical VTE  (DVT) Prophylaxis: aspirin   Fluid Replacement: see anesthesia record  Specimens Removed: 1 to pathology   Sponge and Instrument Count Correct? yes   PACU: portable radiograph - low AP   Plan/RTC: Return in 2 weeks for staple removal. Weight Bearing/Load Lower Extremity: full  Hip precautions: none Suture Removal: 2 weeks   N. Eduard Roux, MD [facility] 11:56 AM   Implant Name Type Inv. Item Serial No. Manufacturer Lot No. LRB No. Used Action  CUP Edwyna Perfect 62MM - [id number] Orthopedic Implant CUP GRIPTION SECTOR 62MM  [employer]PU[employer] [id number] Right 1 Implanted  LINER NEUTRAL 62MMC36MM P4 - [id number] Liner LINER NEUTRAL 62MMC36MM P4  DEPUY ORTHOPA[employer] [id number] Right 1 Implanted  HEAD CERAMIC DELTA 36 PLUS 1.5 - [id number] Hips HEAD CERAMIC DELTA 36 PLUS 1.5  DEPU[employer] ORTH[employer] [id number] Right 1 Implanted  STEM FEMORAL SZ9 STD ACTIS - [id number] Stem STEM FEMORAL SZ9 STD ACTIS  DEPUY ORTHOPAEDICS [id number] Right 1 Implanted

## 2021-10-19 NOTE — Anesthesia Postprocedure Evaluation (Signed)
Anesthesia Post Note  Patient: Larry Reese  Procedure(s) Performed: RIGHT TOTAL HIP ARTHROPLASTY ANTERIOR APPROACH (Right: Hip)     Patient location during evaluation: PACU Anesthesia Type: MAC and Spinal Level of consciousness: awake and alert Pain management: pain level controlled Vital Signs Assessment: post-procedure vital signs reviewed and stable Respiratory status: spontaneous breathing, nonlabored ventilation, respiratory function stable and patient connected to nasal cannula oxygen Cardiovascular status: stable and blood pressure returned to baseline Postop Assessment: no apparent nausea or vomiting Anesthetic complications: no   No notable events documented.  Last Vitals:  Vitals:   10/19/21 1239 10/19/21 1254  BP: (!) 91/56 95/70  Pulse: (!) 50 (!) 44  Resp: 13 16  Temp:    SpO2: 98% 99%    Last Pain:  Vitals:   10/19/21 1239  TempSrc:   PainSc: 0-No pain                 Carnell Beavers

## 2021-10-19 NOTE — Transfer of Care (Signed)
Immediate Anesthesia Transfer of Care Note  Patient: Larry Reese  Procedure(s) Performed: RIGHT TOTAL HIP ARTHROPLASTY ANTERIOR APPROACH (Right: Hip)  Patient Location: PACU  Anesthesia Type:MAC and Spinal  Level of Consciousness: awake, alert  and oriented  Airway & Oxygen Therapy: Patient Spontanous Breathing  Post-op Assessment: Report given to RN and Post -op Vital signs reviewed and stable  Post vital signs: Reviewed and stable  Last Vitals:  Vitals Value Taken Time  BP 93/65 10/19/21 1226  Temp    Pulse 71 10/19/21 1233  Resp 15 10/19/21 1233  SpO2 100 % 10/19/21 1233  Vitals shown include unvalidated device data.  Last Pain:  Vitals:   10/19/21 0827  TempSrc:   PainSc: 3       Patients Stated Pain Goal: 0 (60/04/59 9774)  Complications: No notable events documented.

## 2021-10-20 ENCOUNTER — Encounter (HOSPITAL_COMMUNITY): Payer: Self-pay | Admitting: Orthopaedic Surgery

## 2021-10-20 DIAGNOSIS — M87051 Idiopathic aseptic necrosis of right femur: Secondary | ICD-10-CM | POA: Diagnosis not present

## 2021-10-20 DIAGNOSIS — M1611 Unilateral primary osteoarthritis, right hip: Secondary | ICD-10-CM | POA: Diagnosis not present

## 2021-10-20 DIAGNOSIS — Z7982 Long term (current) use of aspirin: Secondary | ICD-10-CM | POA: Diagnosis not present

## 2021-10-20 DIAGNOSIS — Z79899 Other long term (current) drug therapy: Secondary | ICD-10-CM | POA: Diagnosis not present

## 2021-10-20 DIAGNOSIS — F17210 Nicotine dependence, cigarettes, uncomplicated: Secondary | ICD-10-CM | POA: Diagnosis not present

## 2021-10-20 DIAGNOSIS — I10 Essential (primary) hypertension: Secondary | ICD-10-CM | POA: Diagnosis not present

## 2021-10-20 LAB — BASIC METABOLIC PANEL
Anion gap: 10 (ref 5–15)
BUN: 13 mg/dL (ref 8–23)
CO2: 18 mmol/L — ABNORMAL LOW (ref 22–32)
Calcium: 8.3 mg/dL — ABNORMAL LOW (ref 8.9–10.3)
Chloride: 104 mmol/L (ref 98–111)
Creatinine, Ser: 1.34 mg/dL — ABNORMAL HIGH (ref 0.61–1.24)
GFR, Estimated: 56 mL/min — ABNORMAL LOW (ref >60)
Glucose, Bld: 113 mg/dL — ABNORMAL HIGH (ref 70–99)
Potassium: 4.1 mmol/L (ref 3.5–5.1)
Sodium: 132 mmol/L — ABNORMAL LOW (ref 135–145)

## 2021-10-20 LAB — CBC
HCT: 36.7 % — ABNORMAL LOW (ref 39.0–52.0)
Hemoglobin: 12.2 g/dL — ABNORMAL LOW (ref 13.0–17.0)
MCH: 32.2 pg (ref 26.0–34.0)
MCHC: 33.2 g/dL (ref 30.0–36.0)
MCV: 96.8 fL (ref 80.0–100.0)
Platelets: 221 10*3/uL (ref 150–400)
RBC: 3.79 MIL/uL — ABNORMAL LOW (ref 4.22–5.81)
RDW: 14.3 % (ref 11.5–15.5)
WBC: 7.6 10*3/uL (ref 4.0–10.5)
nRBC: 0 % (ref 0.0–0.2)

## 2021-10-20 NOTE — Plan of Care (Signed)
°  Problem: Safety: Goal: Ability to remain free from injury will improve Outcome: Progressing   Problem: Education: Goal: Knowledge of the prescribed therapeutic regimen will improve Outcome: Progressing   Problem: Activity: Goal: Ability to tolerate increased activity will improve Outcome: Progressing

## 2021-10-20 NOTE — TOC Transition Note (Addendum)
Transition of Care [facility]) - CM/SW Discharge Note   Patient Details  Name: Larry Reese MRN: [id number] Date of Birth: 1946/04/18  Transition of Care [facility]) CM/SW Contact:  Sharin Mons, RN Phone Number: 10/20/2021, 4:56 PM   Clinical Narrative:     TOC WILL CONTINUE TO FOLLOW  Final next level of care: [place]     Patient Goals and CMS Choice     Choice offered to / list presented to : Patient  Discharge Placement                       Discharge Plan and Services   Discharge Planning Services: CM Consult            DME Arranged: 3-N-1, Walker rolling DME Agency: [employer] Date DME Agency Contacted: 10/20/21 Time DME Agency Contacted: [phone] Representative spoke with at DME Agency: [place]: PT [place]: [place] Date [place]: 10/20/21 Time [place]: 1357 Representative spoke with at [place]: [place] ([place]) Interventions     Readmission Risk Interventions No flowsheet data found.

## 2021-10-20 NOTE — Progress Notes (Signed)
Subjective: 1 Day Post-Op Procedure(s) (LRB): RIGHT TOTAL HIP ARTHROPLASTY ANTERIOR APPROACH (Right) Patient reports pain as mild.    Objective: Vital signs in last 24 hours: Temp:  [98 F (36.7 C)-98.9 F (37.2 C)] 98.9 F (37.2 C) (01/30 2048) Pulse Rate:  [38-70] 57 (01/30 2048) Resp:  [12-85] 15 (01/30 2048) BP: (91-134)/(56-84) 124/68 (01/30 2048) SpO2:  [94 %-100 %] 100 % (01/30 2048) Weight:  [92.5 kg] 92.5 kg (01/30 0812)  Intake/Output from previous day: 01/30 0701 - 01/31 0700 In: 748.5 [P.O.:360; I.V.:38.5; IV Piggyback:200] Out: -  Intake/Output this shift: No intake/output data recorded.  Recent Labs    10/20/21 0257  HGB 12.2*   Recent Labs    10/20/21 0257  WBC 7.6  RBC 3.79*  HCT 36.7*  PLT 221   Recent Labs    10/20/21 0257  NA 132*  K 4.1  CL 104  CO2 18*  BUN 13  CREATININE 1.34*  GLUCOSE 113*  CALCIUM 8.3*   No results for input(s): LABPT, INR in the last 72 hours.  Neurologically intact Neurovascular intact Sensation intact distally Intact pulses distally Dorsiflexion/Plantar flexion intact Incision: dressing C/D/I No cellulitis present Compartment soft   Assessment/Plan: 1 Day Post-Op Procedure(s) (LRB): RIGHT TOTAL HIP ARTHROPLASTY ANTERIOR APPROACH (Right) Advance diet Up with therapy Plan for discharge tomorrow likely with HHPT WBAT RLE ABLA- mild and stable AKI- d/c toradol. Continue iv fluids and encourage po fluids      Aundra Dubin 10/20/2021, 7:47 AM

## 2021-10-20 NOTE — Evaluation (Signed)
Physical Therapy Evaluation Patient Details Name: Larry Reese MRN: [id number] DOB: 01-Mar-1946 Today's Date: 10/20/2021  History of Present Illness  Patient is 76 y.o. male s/p Rt THA anterior approach on 10/19/21 with PMH significant for Afib, anemia, OA, bradycardia, HTN, SDH in 2006, Stroke with Lt sided weakness.   Clinical Impression  Larry Reese is a 76 y.o. male POD 1 s/p Rt THA. Patient reports independence with mobility at baseline. Patient is now limited by functional impairments (see PT problem list below) and requires min assist/guard for transfers and gait with RW. Patient was able to ambulate ~110 feet with RW and min guard/assist. Initiated stair mobility with min guard and cues for safe sequencing. Patient instructed in exercise to facilitate ROM and circulation to manage edema. Patient will benefit from continued skilled PT interventions to address impairments and progress towards PLOF. Acute PT will follow to progress mobility and stair training in preparation for safe discharge home.        Recommendations for follow up therapy are one component of a multi-disciplinary discharge planning process, led by the attending physician.  Recommendations may be updated based on patient status, additional functional criteria and insurance authorization.  Follow Up Recommendations Home health PT    Assistance Recommended at Discharge Frequent or constant Supervision/Assistance  Patient can return home with the following  Assistance with cooking/housework;Assist for transportation;Help with stairs or ramp for entrance;A little help with bathing/dressing/bathroom    Equipment Recommendations Rolling walker (2 wheels);BSC/3in1 (pt is 6'7")  Recommendations for Other Services       Functional Status Assessment Patient has had a recent decline in their functional status and demonstrates the ability to make significant improvements in function in a reasonable and predictable  amount of time.     Precautions / Restrictions Precautions Precautions: Fall Restrictions Weight Bearing Restrictions: No      Mobility  Bed Mobility Overal bed mobility: Needs Assistance Bed Mobility: Supine to Sit     Supine to sit: Min guard, HOB elevated     General bed mobility comments: cue to sequence bringing LE's off EOB and to use bed rail to raise trunk, guard for safety, no assist needed.    Transfers Overall transfer level: Needs assistance Equipment used: Rolling walker (2 wheels) Transfers: Sit to/from Stand Sit to Stand: Min assist, From elevated surface           General transfer comment: EOB elevated to accomodate pt's height. min assist to fully power up and steady in standing. no LOB  noted.    Ambulation/Gait Ambulation/Gait assistance: Min assist Gait Distance (Feet): 110 Feet Assistive device: Rolling walker (2 wheels) Gait Pattern/deviations: Step-to pattern, Decreased stride length, Step-through pattern Gait velocity: decr     General Gait Details: cues for step to pattern and position of RW. pt required increased cues to keep walker close during turns as he has tendency to move walker too far foward. No overt LOB noted and pt progressed toward step through pattern.  Stairs Stairs: Yes Stairs assistance: Min guard Stair Management: Two rails, Step to pattern, Forwards Number of Stairs: 2 General stair comments: cues for sequencing, "up with good, down with bad" and for safe assistance to manage RW. no LOB noted.  Wheelchair Mobility    Modified Rankin (Stroke Patients Only)       Balance  Pertinent Vitals/Pain Pain Assessment Pain Assessment: Faces Faces Pain Scale: Hurts a little bit Pain Location: Rt hip Pain Descriptors / Indicators: Discomfort Pain Intervention(s): Limited activity within patient's tolerance, Monitored during session, Repositioned    Home  Living Family/patient expects to be discharged to:: Private residence Living Arrangements: Spouse/significant other Available Help at Discharge: Family Type of Home: House Home Access: Level entry     Alternate Level Stairs-Number of Steps: 3 Home Layout: One level;Multi-level Home Equipment: Grab bars - tub/shower Additional Comments: assist from wife    Prior Function Prior Level of Function : Independent/Modified Independent                     Hand Dominance        Extremity/Trunk Assessment             Cervical / Trunk Assessment Cervical / Trunk Assessment: Normal (tall)  Communication   Communication: HOH  Cognition Arousal/Alertness: Awake/alert Behavior During Therapy: WFL for tasks assessed/performed Overall Cognitive Status: Within Functional Limits for tasks assessed                                          General Comments      Exercises Total Joint Exercises Ankle Circles/Pumps: AROM, Both, 15 reps, Seated Quad Sets: AROM, Right, 5 reps, Seated Heel Slides: AROM, Right, 5 reps, Seated   Assessment/Plan    PT Assessment Patient needs continued PT services  PT Problem List Decreased strength;Decreased range of motion;Decreased activity tolerance;Decreased balance;Decreased mobility;Decreased knowledge of use of DME;Decreased knowledge of precautions       PT Treatment Interventions DME instruction;Gait training;Stair training;Functional mobility training;Therapeutic activities;Therapeutic exercise;Balance training;Patient/family education    PT Goals (Current goals can be found in the Care Plan section)  Acute Rehab PT Goals Patient Stated Goal: get home and move better PT Goal Formulation: With patient Time For Goal Achievement: 10/27/21 Potential to Achieve Goals: Good    Frequency 7X/week     Co-evaluation               AM-PAC PT "6 Clicks" Mobility  Outcome Measure Help needed turning from your back  to your side while in a flat bed without using bedrails?: A Little Help needed moving from lying on your back to sitting on the side of a flat bed without using bedrails?: A Little Help needed moving to and from a bed to a chair (including a wheelchair)?: A Little Help needed standing up from a chair using your arms (e.g., wheelchair or bedside chair)?: A Little Help needed to walk in hospital room?: A Little Help needed climbing 3-5 steps with a railing? : A Little 6 Click Score: 18    End of Session Equipment Utilized During Treatment: Gait belt Activity Tolerance: Patient tolerated treatment well Patient left: in chair;with call bell/phone within reach;with family/visitor present Nurse Communication: Mobility status PT Visit Diagnosis: Muscle weakness (generalized) (M62.81);Difficulty in walking, not elsewhere classified (R26.2)    Time: 5631-4970 PT Time Calculation (min) (ACUTE ONLY): 34 min   Charges:   PT Evaluation $PT Eval Low Complexity: 1 Low PT Treatments $Gait Training: 8-22 mins        Verner Mould, DPT Acute Rehabilitation Services Office [phone] Pager [phone]   Jacques Navy 10/20/2021, 1:16 PM

## 2021-10-20 NOTE — Progress Notes (Signed)
Physical Therapy Treatment Patient Details Name: Larry Reese MRN: [id number] DOB: Jan 17, 1946 Today's Date: 10/20/2021   History of Present Illness Patient is 76 y.o. male s/p Rt THA anterior approach on 10/19/21 with PMH significant for Afib, anemia, OA, bradycardia, HTN, SDH in 2006, Stroke with Lt sided weakness.    PT Comments    Patient progressing well with mobility and ambulated ~200' with min guard/supervision. Intermittent cues needed for safety with hand placement and position of walker as pt still moves it too far forward when turning. Reviewed additional exercises for ROM/strengthening in sitting position. Acute PT will continue to progress pt as able. Anticipate he will continue to progress well during stay.    Recommendations for follow up therapy are one component of a multi-disciplinary discharge planning process, led by the attending physician.  Recommendations may be updated based on patient status, additional functional criteria and insurance authorization.  Follow Up Recommendations  Home health PT     Assistance Recommended at Discharge Frequent or constant Supervision/Assistance  Patient can return home with the following Assistance with cooking/housework;Assist for transportation;Help with stairs or ramp for entrance;A little help with bathing/dressing/bathroom   Equipment Recommendations  Rolling walker (2 wheels);BSC/3in1 (pt is 6'7")    Recommendations for Other Services       Precautions / Restrictions Precautions Precautions: Fall Restrictions Weight Bearing Restrictions: No     Mobility  Bed Mobility Overal bed mobility: Needs Assistance Bed Mobility: Supine to Sit     Supine to sit: Min guard, HOB elevated     General bed mobility comments: pt OOB in recliner    Transfers Overall transfer level: Needs assistance Equipment used: Rolling walker (2 wheels) Transfers: Sit to/from Stand Sit to Stand: From elevated surface, Min guard,  Supervision           General transfer comment: good carryover for safe hand placement with power up, pt using bil UE's for power up. supervision for safety with rise.    Ambulation/Gait Ambulation/Gait assistance: Min guard, Supervision Gait Distance (Feet): 200 Feet Assistive device: Rolling walker (2 wheels) Gait Pattern/deviations: Step-to pattern, Decreased stride length, Step-through pattern Gait velocity: fair     General Gait Details: pt with good recall for safe walker position/management. no overt LOB noted. occasional cues needed to keepw alekr close during turns.   Stairs Stairs: Yes Stairs assistance: Min guard Stair Management: Two rails, Step to pattern, Forwards Number of Stairs: 2 General stair comments: cues for sequencing, "up with good, down with bad" and for safe assistance to manage RW. no LOB noted.   Wheelchair Mobility    Modified Rankin (Stroke Patients Only)       Balance                                            Cognition Arousal/Alertness: Awake/alert Behavior During Therapy: WFL for tasks assessed/performed Overall Cognitive Status: Within Functional Limits for tasks assessed                                          Exercises Total Joint Exercises Ankle Circles/Pumps: AROM, Both, 15 reps, Seated Quad Sets: AROM, Right, 5 reps, Seated Short Arc Quad: AROM, Right, 10 reps, Seated Heel Slides: AROM, Right, 5 reps, Seated Hip ABduction/ADduction: AROM, Right,  10 reps, Seated Long Arc Quad: AROM, Right, 10 reps, Seated    General Comments        Pertinent Vitals/Pain Pain Assessment Pain Assessment: Faces Faces Pain Scale: Hurts a little bit Pain Location: Rt hip Pain Descriptors / Indicators: Discomfort Pain Intervention(s): Limited activity within patient's tolerance, Monitored during session, Repositioned    Home Living Family/patient expects to be discharged to:: Private  residence Living Arrangements: Spouse/significant other Available Help at Discharge: Family Type of Home: House Home Access: Level entry     Alternate Level Stairs-Number of Steps: 3 Home Layout: One level;Multi-level Home Equipment: Grab bars - tub/shower Additional Comments: assist from wife    Prior Function            PT Goals (current goals can now be found in the care plan section) Acute Rehab PT Goals Patient Stated Goal: get home and move better PT Goal Formulation: With patient Time For Goal Achievement: 10/27/21 Potential to Achieve Goals: Good Progress towards PT goals: Progressing toward goals    Frequency    7X/week      PT Plan      Co-evaluation              AM-PAC PT "6 Clicks" Mobility   Outcome Measure  Help needed turning from your back to your side while in a flat bed without using bedrails?: A Little Help needed moving from lying on your back to sitting on the side of a flat bed without using bedrails?: A Little Help needed moving to and from a bed to a chair (including a wheelchair)?: A Little Help needed standing up from a chair using your arms (e.g., wheelchair or bedside chair)?: A Little Help needed to walk in hospital room?: A Little Help needed climbing 3-5 steps with a railing? : A Little 6 Click Score: 18    End of Session Equipment Utilized During Treatment: Gait belt Activity Tolerance: Patient tolerated treatment well Patient left: in chair;with call bell/phone within reach;with family/visitor present Nurse Communication: Mobility status PT Visit Diagnosis: Muscle weakness (generalized) (M62.81);Difficulty in walking, not elsewhere classified (R26.2)     Time: 1350-1415 PT Time Calculation (min) (ACUTE ONLY): 25 min  Charges:  $Gait Training: 8-22 mins $Therapeutic Exercise: 8-22 mins                     Verner Mould, DPT Acute Rehabilitation Services Office [phone] Pager [phone]    Jacques Navy 10/20/2021, 4:19 PM

## 2021-10-21 DIAGNOSIS — I10 Essential (primary) hypertension: Secondary | ICD-10-CM | POA: Diagnosis not present

## 2021-10-21 DIAGNOSIS — M1611 Unilateral primary osteoarthritis, right hip: Secondary | ICD-10-CM | POA: Diagnosis not present

## 2021-10-21 DIAGNOSIS — F17210 Nicotine dependence, cigarettes, uncomplicated: Secondary | ICD-10-CM | POA: Diagnosis not present

## 2021-10-21 DIAGNOSIS — M87051 Idiopathic aseptic necrosis of right femur: Secondary | ICD-10-CM | POA: Diagnosis not present

## 2021-10-21 DIAGNOSIS — Z7982 Long term (current) use of aspirin: Secondary | ICD-10-CM | POA: Diagnosis not present

## 2021-10-21 DIAGNOSIS — Z79899 Other long term (current) drug therapy: Secondary | ICD-10-CM | POA: Diagnosis not present

## 2021-10-21 NOTE — Discharge Summary (Signed)
Patient ID: Larry Reese MRN: [id number] DOB/AGE: 03-15-1946 76 y.o.  Admit date: 10/19/2021 Discharge date: 10/21/2021  Admission Diagnoses:  Avascular necrosis of bone of right hip [facility])  Discharge Diagnoses:  Principal Problem:   Avascular necrosis of bone of right hip ([place]) Active Problems:   Status post total replacement of right hip   Past Medical History:  Diagnosis Date   A-fib ([place])    Anemia    Arthritis    Bradycardia    Dupuytren's contracture of right hand    Hx of adenomatous colonic polyps    Hypertension    Pneumothorax 09/18/1998   spontaneous right PTX 09/18/98, s/p chest tube   SDH (subdural hematoma) 10/27/2004   left chronic SDH versus hygroma 10/27/04 CT   Stroke [facility]) 2005   left sided weakness   Trigger finger     Surgeries: Procedure(s): RIGHT TOTAL HIP ARTHROPLASTY ANTERIOR APPROACH on 10/19/2021   Consultants (if any):   Discharged Condition: Improved  Hospital Course: Larry Reese is an 76 y.o. male who was admitted 10/19/2021 with a diagnosis of Avascular necrosis of bone of right hip ([place]) and went to the operating room on 10/19/2021 and underwent the above named procedures.    He was given perioperative antibiotics:  Anti-infectives (From admission, onward)    Start     Dose/Rate Route Frequency Ordered Stop   10/19/21 1930  ceFAZolin (ANCEF) IVPB 2g/100 mL premix        2 g 200 mL/hr over 30 Minutes Intravenous Every 6 hours 10/19/21 1840 10/20/21 0634   10/19/21 1930  ceFAZolin (ANCEF) IVPB 2g/100 mL premix  Status:  Discontinued        2 g 200 mL/hr over 30 Minutes Intravenous Every 6 hours 10/19/21 1840 10/19/21 1849   10/19/21 1152  vancomycin (VANCOCIN) powder  Status:  Discontinued          As needed 10/19/21 1152 10/19/21 1218   10/19/21 0815  ceFAZolin (ANCEF) IVPB 2g/100 mL premix        2 g 200 mL/hr over 30 Minutes Intravenous On call to O.R. 10/19/21 [id number] 10/19/21 1028     .  He was given sequential  compression devices, early ambulation, and appropriate chemoprophylaxis for DVT prophylaxis.  He benefited maximally from the hospital stay and there were no complications.    Recent vital signs:  Vitals:   10/21/21 0447 10/21/21 0815  BP: 130/80 130/79  Pulse: 60 68  Resp: 19 18  Temp:  98.6 F (37 C)  SpO2: 97% 100%    Recent laboratory studies:  Lab Results  Component Value Date   HGB 12.2 (L) 10/20/2021   HGB 14.0 10/15/2021   HGB 12.7 (L) 09/17/2020   Lab Results  Component Value Date   WBC 7.6 10/20/2021   PLT 221 10/20/2021   Lab Results  Component Value Date   INR 1.0 05/01/2020   Lab Results  Component Value Date   NA 132 (L) 10/20/2021   K 4.1 10/20/2021   CL 104 10/20/2021   CO2 18 (L) 10/20/2021   BUN 13 10/20/2021   CREATININE 1.34 (H) 10/20/2021   GLUCOSE 113 (H) 10/20/2021    Discharge Medications:   Allergies as of 10/21/2021       Reactions   Aleve [naproxen] Other (See Comments)   Numbness in extremities   Celecoxib Other (See Comments)   Celebrex. Nose bleed.   Fluoxetine Hcl Swelling  Medication List     STOP taking these medications    diclofenac sodium 1 % Gel Commonly known as: VOLTAREN   HYDROcodone-acetaminophen 7.5-325 MG tablet Commonly known as: NORCO       TAKE these medications    amLODipine 10 MG tablet Commonly known as: NORVASC Take 10 mg by mouth daily.   aspirin EC 81 MG tablet Take 1 tablet (81 mg total) by mouth 2 (two) times daily. To be taken after surgery to prevent blood clots   cholecalciferol 25 MCG (1000 UNIT) tablet Commonly known as: VITAMIN D3 Take 1,000 Units by mouth daily.   docusate sodium 100 MG capsule Commonly known as: Colace Take 1 capsule (100 mg total) by mouth daily as needed.   methocarbamol 500 MG tablet Commonly known as: Robaxin Take 1 tablet (500 mg total) by mouth 2 (two) times daily as needed. To be taken after surgery   ondansetron 4 MG tablet Commonly  known as: Zofran Take 1 tablet (4 mg total) by mouth every 8 (eight) hours as needed for nausea or vomiting.   oxyCODONE 5 MG immediate release tablet Commonly known as: Roxicodone Take 1-2 tabs po every 6-8 hours prn pain   tamsulosin 0.4 MG Caps capsule Commonly known as: FLOMAX Take 0.4 mg by mouth daily.               Durable Medical Equipment  (From admission, onward)           Start     Ordered   10/20/21 1655  DME Walker rolling  Once       Comments: Rolling walker (2 wheels); (pt is 6'7")  Question:  Patient needs a walker to treat with the following condition  Answer:  History of hip replacement   10/20/21 1654   10/20/21 1655  DME 3 n 1  Once       Comments: BSC/3in1 (pt is 6'7")   10/20/21 1655   10/19/21 1840  DME Bedside commode  Once       Question:  Patient needs a bedside commode to treat with the following condition  Answer:  History of hip replacement   10/19/21 1840            Diagnostic Studies: DG Pelvis Portable  Result Date: 10/19/2021 CLINICAL DATA:  Status post right hip replacement for avascular necrosis. EXAM: PORTABLE PELVIS 1-2 VIEWS COMPARISON:  C-arm images obtained today. Right hip radiographs dated 07/28/2021. Left hip radiographs dated 09/18/2018. FINDINGS: Interval right hip bipolar prosthesis in satisfactory position and alignment. No fracture or dislocation seen. Dystrophic calcifications in the soft tissues lateral to the left hip and pelvis and overlying the left hip and inferior pelvis, without significant change since 07/28/2021. Degenerative changes involving the left sacroiliac joint. Possible small area of avascular necrosis in the superior aspect of the left femoral head. IMPRESSION: 1. Satisfactory postoperative appearance of a right hip prosthesis. 2. Degenerative left sacroiliitis and left dystrophic calcifications. 3. Possible small area of avascular necrosis in the superior aspect of the left femoral head. Electronically  Signed   By: Claudie Revering M.D.   On: 10/19/2021 13:03   DG Foot Complete Right  Result Date: 10/19/2021 CLINICAL DATA:  Postop EXAM: RIGHT FOOT COMPLETE - 3 VIEW COMPARISON:  None. FINDINGS: There is no evidence of fracture or dislocation. Subluxation of the fourth through fifth digits at the MTP joint. Mild IP joint degenerative changes. Mild vascular calcifications. IMPRESSION: No acute osseous abnormality. Electronically Signed  By: Yetta Glassman M.D.   On: 10/19/2021 13:58   DG C-Arm 1-60 Min-No Report  Result Date: 10/19/2021 Fluoroscopy was utilized by the requesting physician.  No radiographic interpretation.   DG C-Arm 1-60 Min-No Report  Result Date: 10/19/2021 Fluoroscopy was utilized by the requesting physician.  No radiographic interpretation.   DG HIP UNILAT WITH PELVIS 1V RIGHT  Result Date: 10/19/2021 CLINICAL DATA:  Fluoroscopic assistance for right hip arthroplasty EXAM: DG HIP (WITH OR WITHOUT PELVIS) 1V RIGHT COMPARISON:  07/28/2021 FINDINGS: Fluoroscopic images show interval right hip arthroplasty. Severe degenerative changes were noted in the right hip in the previous study. Fluoroscopic time was 27.7 seconds. Radiation dose is 1.12 mGy. IMPRESSION: Fluoroscopic assistance was provided for right hip arthroplasty. Electronically Signed   By: Elmer Picker M.D.   On: 10/19/2021 12:58    Disposition: Discharge disposition: 01-Home or Self Care       Discharge Instructions     Call MD / Call 911   Complete by: As directed    If you experience chest pain or shortness of breath, CALL 911 and be transported to the hospital emergency room.  If you develope a fever above 101.5 F, pus (white drainage) or increased drainage or redness at the wound, or calf pain, call your surgeon's office.   Constipation Prevention   Complete by: As directed    Drink plenty of fluids.  Prune juice may be helpful.  You may use a stool softener, such as Colace (over the counter) 100  mg twice a day.  Use MiraLax (over the counter) for constipation as needed.   Driving restrictions   Complete by: As directed    No driving while taking narcotic pain meds.   Increase activity slowly as tolerated   Complete by: As directed    Post-operative opioid taper instructions:   Complete by: As directed    POST-OPERATIVE OPIOID TAPER INSTRUCTIONS: It is important to wean off of your opioid medication as soon as possible. If you do not need pain medication after your surgery it is ok to stop day one. Opioids include: Codeine, Hydrocodone(Norco, Vicodin), Oxycodone(Percocet, oxycontin) and hydromorphone amongst others.  Long term and even short term use of opiods can cause: Increased pain response Dependence Constipation Depression Respiratory depression And more.  Withdrawal symptoms can include Flu like symptoms Nausea, vomiting And more Techniques to manage these symptoms Hydrate well Eat regular healthy meals Stay active Use relaxation techniques(deep breathing, meditating, yoga) Do Not substitute Alcohol to help with tapering If you have been on opioids for less than two weeks and do not have pain than it is ok to stop all together.  Plan to wean off of opioids This plan should start within one week post op of your joint replacement. Maintain the same interval or time between taking each dose and first decrease the dose.  Cut the total daily intake of opioids by one tablet each day Next start to increase the time between doses. The last dose that should be eliminated is the evening dose.           Follow-up Information     Leandrew Koyanagi, MD. Schedule an appointment as soon as possible for a visit in 2 week(s).   Specialty: Orthopedic Surgery Contact information: [place] [phone]         Health, [place] Follow up.   Specialty: Home Health Services Why: home health services will be provided by [facility]  Health, start of care within 48 hours post discharge Contact information: [address] [place]  [phone]                  Signed: Eduard Roux 10/21/2021, 9:53 AM

## 2021-10-21 NOTE — Progress Notes (Signed)
Physical Therapy Treatment Patient Details Name: Larry Reese MRN: [id number] DOB: [birth year] Today's Date: 10/21/2021   History of Present Illness Patient is 76 y.o. male s/p Rt THA anterior approach on 10/19/21 with PMH significant for Afib, anemia, OA, bradycardia, HTN, SDH in 2006, Stroke with Lt sided weakness.    PT Comments    Pt received in supine and agreeable and eager for session. Pt continues to progress well with mobility with minimal cues needed for upright posture and RW proximity throughout.  Good carryover for stair training with not LOB, with intermittent cues needed for sequencing, spouse able to recall and demonstrate safe guarding technique throughout session. Reviewed all DME, HEP, precautions and proper icing with pt and spouse with both demonstrating understanding. Pt continues to benefit from skilled PT services to progress toward functional mobility goals. Anticipate safe discharge to home, with spousal support, once medically cleared.   Recommendations for follow up therapy are one component of a multi-disciplinary discharge planning process, led by the attending physician.  Recommendations may be updated based on patient status, additional functional criteria and insurance authorization.  Follow Up Recommendations  Home health PT     Assistance Recommended at Discharge Frequent or constant Supervision/Assistance  Patient can return home with the following Assistance with cooking/housework;Assist for transportation;Help with stairs or ramp for entrance;A little help with bathing/dressing/bathroom   Equipment Recommendations  Rolling walker (2 wheels);BSC/3in1    Recommendations for Other Services       Precautions / Restrictions Precautions Precautions: Fall Restrictions Weight Bearing Restrictions: Yes Other Position/Activity Restrictions: R hip WBAT     Mobility  Bed Mobility Overal bed mobility: Needs Assistance Bed Mobility: Supine to Sit      Supine to sit: Min guard, HOB elevated          Transfers Overall transfer level: Needs assistance Equipment used: Rolling walker (2 wheels) Transfers: Sit to/from Stand Sit to Stand: From elevated surface, Min guard, Supervision           General transfer comment: supervision from elevated EOB for pt height. Min guard to sit in recliner with cues for technique for pain    Ambulation/Gait Ambulation/Gait assistance: Min guard, Supervision Gait Distance (Feet): 180 Feet Assistive device: Rolling walker (2 wheels) Gait Pattern/deviations: Step-to pattern, Decreased stride length, Step-through pattern Gait velocity: fair     General Gait Details: no overt LOB noted. occasional cues needed to keepw walker close and maintain upright posture.   Stairs Stairs: Yes Stairs assistance: Min guard Stair Management: Two rails, Step to pattern, Forwards Number of Stairs: 4 General stair comments: cues needed for "up with good, down with bad" safe safe assistance from spouse. Improved with task practice. no LOB noted.   Wheelchair Mobility    Modified Rankin (Stroke Patients Only)       Balance       Cognition Arousal/Alertness: Awake/alert Behavior During Therapy: WFL for tasks assessed/performed Overall Cognitive Status: Within Functional Limits for tasks assessed          Exercises      General Comments        Pertinent Vitals/Pain Pain Assessment Pain Assessment: 0-10 Pain Score: 5  Pain Location: Rt hip with mobility Pain Descriptors / Indicators: Discomfort Pain Intervention(s): Limited activity within patient's tolerance, Monitored during session, Premedicated before session, Repositioned    Home Living Family/patient expects to be discharged to:: Private residence Living Arrangements: Spouse/significant other Available Help at Discharge: Family Type of Home: House Home Access: Level  entry     Alternate Level Stairs-Number of Steps: 3 Home  Layout: One level;Multi-level Home Equipment: Grab bars - tub/shower Additional Comments: assist from wife    Prior Function            PT Goals (current goals can now be found in the care plan section) Acute Rehab PT Goals Patient Stated Goal: get home and move better PT Goal Formulation: With patient Time For Goal Achievement: 10/27/21    Frequency    7X/week      PT Plan      Co-evaluation              AM-PAC PT "6 Clicks" Mobility   Outcome Measure  Help needed turning from your back to your side while in a flat bed without using bedrails?: A Little Help needed moving from lying on your back to sitting on the side of a flat bed without using bedrails?: A Little Help needed moving to and from a bed to a chair (including a wheelchair)?: A Little Help needed standing up from a chair using your arms (e.g., wheelchair or bedside chair)?: A Little Help needed to walk in hospital room?: A Little Help needed climbing 3-5 steps with a railing? : A Little 6 Click Score: 18    End of Session Equipment Utilized During Treatment: Gait belt Activity Tolerance: Patient tolerated treatment well Patient left: in chair;with call bell/phone within reach;with family/visitor present Nurse Communication: Mobility status PT Visit Diagnosis: Muscle weakness (generalized) (M62.81);Difficulty in walking, not elsewhere classified (R26.2)     Time: 0630-1601 PT Time Calculation (min) (ACUTE ONLY): 25 min  Charges:  $Gait Training: 8-22 mins $Therapeutic Activity: 8-22 mins                     Gwenyth Dingee R. PTA Acute Rehabilitation Office: [place] 10/21/2021, 10:56 AM

## 2021-10-21 NOTE — Progress Notes (Signed)
Patient stable. Did well with PT and ready to go home.

## 2021-10-21 NOTE — TOC Transition Note (Incomplete)
Transition of Care [facility]) - CM/SW Discharge Note   Patient Details  Name: Larry Reese MRN: [id number] Date of Birth: [birth year]  Transition of [facility]) CM/SW Contact:  Sharin Mons, RN Phone Number: 10/21/2021, 8:48 AM   Clinical Narrative:    Patient will DC to: Home Anticipated DC date: 10/21/2021 Family notified: yes Transport by: car        - s/p Rt THA 10/19/2021 Per MD patient ready for DC today. RN, patient, patient's wife, and [place] notified of DC.   Post hospital f/u noted on AVS. Pt without Rx med concerns. Wife to provide transportation top home. RNCM will sign off for now as intervention is no longer needed. Please consult [place] again if new needs arise.    Final next level of care: [place] Barriers to Discharge: No Barriers Identified   Patient Goals and CMS Choice     Choice offered to / list presented to : Patient  Discharge Placement                       Discharge Plan and Services   Discharge Planning Services: CM Consult            DME Arranged: 3-N-1, Walker rolling DME Agency: [employer] Date DME Agency Contacted: 10/20/21 Time DME Agency Contacted: [phone] Representative spoke with at DME Agency: [place]: PT [place]: [place] Date [place]: 10/20/21 Time [place]: 1031 Representative spoke with at [place]: [place] ([place]) Interventions     Readmission Risk Interventions No flowsheet data found.

## 2021-10-23 ENCOUNTER — Telehealth: Payer: Self-pay | Admitting: *Deleted

## 2021-10-23 NOTE — Telephone Encounter (Signed)
(  Late entry for 10/22/21). D/C call to patient. Doing well except no BM since being home. Recommended starting Miralax as discussed along with stool softener. Eating well. Pain minimal. Will continue to follow for needs.

## 2021-10-26 ENCOUNTER — Telehealth: Payer: Self-pay | Admitting: *Deleted

## 2021-10-26 NOTE — Telephone Encounter (Signed)
Hi Dr. Harrell Gave. Can you please see Vin's note below?  Thank you so much!  Siarah Deleo

## 2021-10-26 NOTE — Telephone Encounter (Signed)
Ortho bundle 7 day call completed. 

## 2021-10-28 DIAGNOSIS — M1990 Unspecified osteoarthritis, unspecified site: Secondary | ICD-10-CM | POA: Diagnosis not present

## 2021-10-28 DIAGNOSIS — I10 Essential (primary) hypertension: Secondary | ICD-10-CM | POA: Diagnosis not present

## 2021-11-03 ENCOUNTER — Other Ambulatory Visit: Payer: Self-pay

## 2021-11-03 ENCOUNTER — Encounter: Payer: Self-pay | Admitting: Orthopaedic Surgery

## 2021-11-03 ENCOUNTER — Telehealth: Payer: Self-pay | Admitting: *Deleted

## 2021-11-03 ENCOUNTER — Ambulatory Visit (INDEPENDENT_AMBULATORY_CARE_PROVIDER_SITE_OTHER): Payer: Medicare HMO | Admitting: Physician Assistant

## 2021-11-03 DIAGNOSIS — Z96641 Presence of right artificial hip joint: Secondary | ICD-10-CM

## 2021-11-03 MED ORDER — METHOCARBAMOL 500 MG PO TABS: 500.0000 mg | ORAL_TABLET | Freq: Two times a day (BID) | ORAL | 0 refills | Status: DC | PRN

## 2021-11-03 MED ORDER — OXYCODONE-ACETAMINOPHEN 5-325 MG PO TABS: 1.0000 | ORAL_TABLET | Freq: Three times a day (TID) | ORAL | 0 refills | Status: DC | PRN

## 2021-11-03 NOTE — Telephone Encounter (Signed)
Dr. Quentin Ore,  Mr. Larry Reese is scheduled for total hip replacement and a request has been made for his ASA to be held. Dr. Harrell Gave has stated that from a PAD standpoint she is fine with holding. Regarding post Larry Reese is he okay to hold ASA and for how many day? Please forward recommendations to (preop) pool at P CV DIV PREOP.  Thanks,  Jaquelyn Bitter

## 2021-11-03 NOTE — Progress Notes (Signed)
Post-Op Visit Note   Patient: Larry Reese           Date of Birth: [birth year]           MRN: [id number] Visit Date: 11/03/2021 PCP: Carrolyn Meiers, MD   Assessment & Plan:  Chief Complaint:  Chief Complaint  Patient presents with   Right Hip - Follow-up    Right total hip arthroplasty 10/19/2021   Visit Diagnoses:  1. History of total hip replacement, right     Plan: Patient is a pleasant 76 year old gentleman who comes in today 2 weeks status post right total hip replacement, date of surgery 10/19/2021.  He has been doing well.  He has finished home health physical therapy and is ambulating primarily unassisted.  He has only been taking his aspirin once daily.  He denies any chest pain, calf pain or shortness of breath.  Examination of his right hip reveals a fully healed surgical scar with nylon sutures in place.  No evidence of infection or cellulitis.  Calves are soft nontender.  He is neurovascular intact distally.  Today, sutures were removed and Steri-Strips applied.  He will continue with his exercises.  I refilled his pain medication and muscle relaxer.  He will follow-up with [place] in 4 weeks time for repeat evaluation and AP pelvis x-rays.  Dental prophylaxis reinforced.  Follow-Up Instructions: Return in about 4 weeks (around 12/01/2021).   Orders:  No orders of the defined types were placed in this encounter.  No orders of the defined types were placed in this encounter.   Imaging: no new imaging  PMFS History: Patient Active Problem List   Diagnosis Date Noted   Status post total replacement of right hip 10/19/2021   Paroxysmal atrial fibrillation ([place]) 09/04/2020   Typical atrial flutter ([place]) 08/20/2020   Secondary hypercoagulable state ([place]) 08/20/2020   History of subdural hematoma 06/12/2020   PAD (peripheral artery disease) ([place]) 05/27/2020   Essential hypertension 05/27/2020   Atrial fibrillation ([place]) 05/27/2020   Chronic toe pain, right  foot 02/20/2019   Hematoma of left hip 02/20/2019   Avascular necrosis of bone of right hip ([place]) 01/23/2019   Pain in left hip 01/23/2019   Rib pain on left side 01/23/2019   History of colonic polyps    Hepatitis, alcoholic 86/57/8469   ETOH abuse 02/10/2015   Internal hemorrhoids 07/17/2009   Colon adenomas 07/15/2009   Past Medical History:  Diagnosis Date   A-fib ([place])    Anemia    Arthritis    Bradycardia    Dupuytren's contracture of right hand    Hx of adenomatous colonic polyps    Hypertension    Pneumothorax 09/18/1998   spontaneous right PTX 09/18/98, s/p chest tube   SDH (subdural hematoma) 10/27/2004   left chronic SDH versus hygroma 10/27/04 CT   Stroke [facility]) 2005   left sided weakness   Trigger finger     Family History  Problem Relation Age of Onset   Esophageal cancer Father    Colon cancer Neg Hx     Past Surgical History:  Procedure Laterality Date   COLONOSCOPY  May 2010   Dr. fields: 5 cm terminal ileum normal, 6 polyps removed, moderate internal hemorrhoids, simple adenomas, next colonoscopy May 2015   COLONOSCOPY N/A 08/01/2015   Procedure: COLONOSCOPY;  Surgeon: Danie Binder, MD;  Location: [facility] ENDO SUITE;  Service: Endoscopy;  Laterality: N/A;  0830   CRANIOTOMY  2000   pressure  INGUINAL HERNIA REPAIR Right    LEFT ATRIAL APPENDAGE OCCLUSION N/A 09/04/2020   Procedure: LEFT ATRIAL APPENDAGE OCCLUSION;  Surgeon: Vickie Epley, MD;  Location: [place] CV LAB;  Service: Cardiovascular;  Laterality: N/A;   OLECRANON BURSECTOMY Right 02/11/2015   Procedure: EXCISION RIGHT OLECRANON BURSA;  Surgeon: Sanjuana Kava, MD;  Location: [facility] ORS;  Service: Orthopedics;  Laterality: Right;   repair of trigger finger Left    pinky   TEE WITHOUT CARDIOVERSION N/A 09/04/2020   Procedure: TRANSESOPHAGEAL ECHOCARDIOGRAM (TEE);  Surgeon: Vickie Epley, MD;  Location: [place] CV LAB;  Service: Cardiovascular;  Laterality: N/A;   TEE WITHOUT  CARDIOVERSION N/A 10/13/2020   Procedure: TRANSESOPHAGEAL ECHOCARDIOGRAM (TEE);  Surgeon: Geralynn Rile, MD;  Location: [place];  Service: Cardiovascular;  Laterality: N/A;   TOTAL HIP ARTHROPLASTY Right 10/19/2021   Procedure: RIGHT TOTAL HIP ARTHROPLASTY ANTERIOR APPROACH;  Surgeon: Leandrew Koyanagi, MD;  Location: [place];  Service: Orthopedics;  Laterality: Right;  3-C   Social History   Occupational History   Occupation: retired  Tobacco Use   Smoking status: Every Day    Packs/day: 0.25    Years: 50.00    Pack years: 12.50    Types: Cigarettes   Smokeless tobacco: Never   Tobacco comments:    5-6 cigarettes per day  Vaping Use   Vaping Use: Never used  Substance and Sexual Activity   Alcohol use: Yes    Alcohol/week: 5.0 - 6.0 standard drinks    Types: 5 - 6 Cans of beer per week    Comment: 3-4 beers/day   Drug use: No   Sexual activity: Yes    Birth control/protection: None

## 2021-11-03 NOTE — Telephone Encounter (Signed)
Ortho bundle 14 day in office meeting completed. °

## 2021-11-03 NOTE — Telephone Encounter (Signed)
He is on aspirin post [place]--this was implanted in 2021. Ok to hold from PAD standpoint but would confirm with Dr. Quentin Ore that ok to hold aspirin from [facility] perspective.

## 2021-11-06 NOTE — Telephone Encounter (Signed)
° °  Name: Larry Reese  DOB: [birth year]  MRN: [id number]   Primary Cardiologist: Buford Dresser, MD  Chart reviewed as part of pre-operative protocol coverage. Patient was contacted 11/06/2021 in reference to pre-operative risk assessment for pending surgery as outlined below.  Larry Reese was last seen on 07/01/2021 by Dr. Harrell Gave.  Since that day, Larry Reese has done well and has been without symptoms of angina or decompensation. Per his electrophysiologist, he may hold aspirin per protocol prior to his procedure with recommendation to resume as soon as safely possible per the treatment team.  Therefore, based on ACC/AHA guidelines, the patient would be at acceptable risk for the planned procedure without further cardiovascular testing.   The patient was advised that if he develops new symptoms prior to surgery to contact our office to arrange for a follow-up visit, and he verbalized understanding.  I will route this recommendation to the requesting party via Epic fax function and remove from pre-op pool. Please call with questions.  Christell Faith, PA-C 11/06/2021, 2:24 PM

## 2021-11-09 ENCOUNTER — Telehealth: Payer: Self-pay

## 2021-11-09 NOTE — Telephone Encounter (Signed)
Patient is asking for a refill, I saw where he had gotten some Oxycodone 5/325 mg Qty 40 Tablets on 11/03/21.  I told him this and he said they were from Dr. Erlinda Hong for his hip. He was ok with me explaining that you would not do a refill at this time.

## 2021-11-16 ENCOUNTER — Other Ambulatory Visit: Payer: Self-pay | Admitting: Physician Assistant

## 2021-11-20 ENCOUNTER — Other Ambulatory Visit: Payer: Self-pay | Admitting: Physician Assistant

## 2021-11-20 ENCOUNTER — Telehealth: Payer: Self-pay | Admitting: *Deleted

## 2021-11-20 MED ORDER — METHOCARBAMOL 500 MG PO TABS: 500.0000 mg | ORAL_TABLET | Freq: Two times a day (BID) | ORAL | 0 refills | Status: DC | PRN

## 2021-11-20 MED ORDER — HYDROCODONE-ACETAMINOPHEN 5-325 MG PO TABS: 1.0000 | ORAL_TABLET | Freq: Three times a day (TID) | ORAL | 0 refills | Status: DC | PRN

## 2021-11-20 NOTE — Telephone Encounter (Signed)
Patient aware of medication change to Hydrocodone and refill of muscle relaxer as well sent to his pharmacy. ?

## 2021-11-20 NOTE — Telephone Encounter (Signed)
30 day Ortho bundle call from patient. Requesting refill of pain medication and muscle relaxer. Dr. [name] was handling his pain medication before surgery (Hydrocodone I believe). I told him we'd probably handle for a little longer then turn this back over to Dr. [name] at that time. Let me know if that's not correct. He understood. Pharmacy in chart. Thanks. ?

## 2021-11-20 NOTE — Telephone Encounter (Signed)
That's right, i'm sorry!  I just sent

## 2021-11-20 NOTE — Telephone Encounter (Signed)
Weaning to norco and just sent in

## 2021-12-01 ENCOUNTER — Ambulatory Visit (INDEPENDENT_AMBULATORY_CARE_PROVIDER_SITE_OTHER): Payer: Medicare HMO

## 2021-12-01 ENCOUNTER — Encounter: Payer: Self-pay | Admitting: Orthopaedic Surgery

## 2021-12-01 ENCOUNTER — Ambulatory Visit (INDEPENDENT_AMBULATORY_CARE_PROVIDER_SITE_OTHER): Payer: Medicare HMO | Admitting: Orthopaedic Surgery

## 2021-12-01 ENCOUNTER — Other Ambulatory Visit: Payer: Self-pay

## 2021-12-01 DIAGNOSIS — Z96641 Presence of right artificial hip joint: Secondary | ICD-10-CM | POA: Diagnosis not present

## 2021-12-01 NOTE — Progress Notes (Signed)
? ?Post-Op Visit Note ?  ?Patient: Larry Reese           ?Date of Birth: 1946/08/30           ?MRN: [id number] ?Visit Date: 12/01/2021 ?PCP: Carrolyn Meiers, MD ? ? ?Assessment & Plan: ? ?Chief Complaint:  ?Chief Complaint  ?Patient presents with  ? Right Hip - Pain, Routine Post Op  ? ?Visit Diagnoses:  ?1. History of total hip replacement, right   ? ? ?Plan: Larry Reese is 6 weeks status post right total hip replacement on 10/19/2021.  He is doing well overall.  He walks about 2 miles a day with his wife.  He is not really using any assistive devices.  He is doing home exercises. ? ?Examination of the right hip shows a healed surgical scar.  He has some slight swelling.  No signs of infection.  Excellent range of motion.  Essentially normal gait. ? ?The implant looks stable and is without complication.  Dental prophylaxis reinforced.  Continue increase activity as tolerated.  He will need to contact Dr. [name] for refill of medications for chronic left shoulder pain. ? ?Follow-Up Instructions: Return in about 6 weeks (around 01/12/2022).  ? ?Orders:  ?Orders Placed This Encounter  ?Procedures  ? XR Pelvis 1-2 Views  ? ?No orders of the defined types were placed in this encounter. ? ? ?Imaging: ?XR Pelvis 1-2 Views ? ?Result Date: 12/01/2021 ?Stable total hip replacement without complications  ? ?PMFS History: ?Patient Active Problem List  ? Diagnosis Date Noted  ? Status post total replacement of right hip 10/19/2021  ? Paroxysmal atrial fibrillation ([place]) 09/04/2020  ? Typical atrial flutter ([place]) 08/20/2020  ? Secondary hypercoagulable state ([place]) 08/20/2020  ? History of subdural hematoma 06/12/2020  ? PAD (peripheral artery disease) ([place]) 05/27/2020  ? Essential hypertension 05/27/2020  ? Atrial fibrillation ([place]) 05/27/2020  ? Chronic toe pain, right foot 02/20/2019  ? Hematoma of left hip 02/20/2019  ? Avascular necrosis of bone of right hip ([place]) 01/23/2019  ? Pain in left hip 01/23/2019  ? Rib pain on  left side 01/23/2019  ? History of colonic polyps   ? Hepatitis, alcoholic 94/49/6759  ? ETOH abuse 02/10/2015  ? Internal hemorrhoids 07/17/2009  ? Colon adenomas 07/15/2009  ? ?Past Medical History:  ?Diagnosis Date  ? A-fib ([place])   ? Anemia   ? Arthritis   ? Bradycardia   ? Dupuytren's contracture of right hand   ? Hx of adenomatous colonic polyps   ? Hypertension   ? Pneumothorax 09/18/1998  ? spontaneous right PTX 09/18/98, s/p chest tube  ? SDH (subdural hematoma) 10/27/2004  ? left chronic SDH versus hygroma 10/27/04 CT  ? Stroke [facility]) 2005  ? left sided weakness  ? Trigger finger   ?  ?Family History  ?Problem Relation Age of Onset  ? Esophageal cancer Father   ? Colon cancer Neg Hx   ?  ?Past Surgical History:  ?Procedure Laterality Date  ? COLONOSCOPY  May 2010  ? Dr. Oneida Alar: 5 cm terminal ileum normal, 6 polyps removed, moderate internal hemorrhoids, simple adenomas, next colonoscopy May 2015  ? COLONOSCOPY N/A 08/01/2015  ? Procedure: COLONOSCOPY;  Surgeon: Danie Binder, MD;  [facility];  Service: Endoscopy;  Laterality: N/A;  0830  ? CRANIOTOMY  2000  ? pressure  ? INGUINAL HERNIA REPAIR Right   ? LEFT ATRIAL APPENDAGE OCCLUSION N/A 09/04/2020  ? Procedure: LEFT ATRIAL APPENDAGE OCCLUSION;  Surgeon:  Vickie Epley, MD;  Location: [place] [facility];  Service: Cardiovascular;  Laterality: N/A;  ? OLECRANON BURSECTOMY Right 02/11/2015  ? Procedure: EXCISION RIGHT OLECRANON BURSA;  Surgeon: Sanjuana Kava, MD;  Location: [facility];  Service: Orthopedics;  Laterality: Right;  ? repair of trigger finger Left   ? pinky  ? TEE WITHOUT CARDIOVERSION N/A 09/04/2020  ? Procedure: TRANSESOPHAGEAL ECHOCARDIOGRAM (TEE);  Surgeon: Vickie Epley, MD;  Location: [place] [facility];  Service: Cardiovascular;  Laterality: N/A;  ? TEE WITHOUT CARDIOVERSION N/A 10/13/2020  ? Procedure: TRANSESOPHAGEAL ECHOCARDIOGRAM (TEE);  Surgeon: Geralynn Rile, MD;  Location: [place];  Service:  Cardiovascular;  Laterality: N/A;  ? TOTAL HIP ARTHROPLASTY Right 10/19/2021  ? Procedure: RIGHT TOTAL HIP ARTHROPLASTY ANTERIOR APPROACH;  Surgeon: Leandrew Koyanagi, MD;  Location: [place];  Service: Orthopedics;  Laterality: Right;  3-C  ? ?Social History  ? ?Occupational History  ? Occupation: retired  ?Tobacco Use  ? Smoking status: Every Day  ?  Packs/day: 0.25  ?  Years: 50.00  ?  Pack years: 12.50  ?  Types: Cigarettes  ? Smokeless tobacco: Never  ? Tobacco comments:  ?  5-6 cigarettes per day  ?Vaping Use  ? Vaping Use: Never used  ?Substance and Sexual Activity  ? Alcohol use: Yes  ?  Alcohol/week: 5.0 - 6.0 standard drinks  ?  Types: 5 - 6 Cans of beer per week  ?  Comment: 3-4 beers/day  ? Drug use: No  ? Sexual activity: Yes  ?  Birth control/protection: None  ? ? ? ?

## 2021-12-03 ENCOUNTER — Other Ambulatory Visit: Payer: Self-pay

## 2021-12-03 ENCOUNTER — Ambulatory Visit: Payer: Medicare HMO | Admitting: Orthopaedic Surgery

## 2021-12-03 ENCOUNTER — Encounter: Payer: Self-pay | Admitting: Orthopaedic Surgery

## 2021-12-03 VITALS — BP 138/81 | HR 65 | Ht 78.5 in | Wt 204.0 lb

## 2021-12-03 DIAGNOSIS — Z96641 Presence of right artificial hip joint: Secondary | ICD-10-CM

## 2021-12-03 DIAGNOSIS — G8929 Other chronic pain: Secondary | ICD-10-CM | POA: Diagnosis not present

## 2021-12-03 DIAGNOSIS — F17210 Nicotine dependence, cigarettes, uncomplicated: Secondary | ICD-10-CM

## 2021-12-03 DIAGNOSIS — M25512 Pain in left shoulder: Secondary | ICD-10-CM

## 2021-12-03 NOTE — Progress Notes (Signed)
My shoulder is about the same. ? ?He has more pain in the left shoulder with cold weather like we are having now.  He has no new trauma, no numbness, no swelling. ? ?He is post right total hip and has some tenderness there still.  He is using his walker. ? ?Examination of left Upper Extremity is done. ? Inspection: ?  Overall:  Elbow non-tender without crepitus or defects, forearm non-tender without crepitus or defects, wrist non-tender without crepitus or defects, hand non-tender.  ?  Shoulder: with glenohumeral joint tenderness, without effusion. ?  Upper arm:  without swelling and tenderness ? ? Range of motion: ?  Overall:  Full range of motion of the elbow, full range of motion of wrist and full range of motion in fingers. ?  Shoulder:  left  145 degrees forward flexion; 120 degrees abduction; 20 degrees internal rotation, 20 degrees external rotation, 5 degrees extension, 40 degrees adduction. ? ? Stability: ?  Overall:  Shoulder, elbow and wrist stable ? ? Strength and Tone: ?  Overall full shoulder muscles strength, full upper arm strength and normal upper arm bulk and tone.  ? ?Encounter Diagnoses  ?Name Primary?  ? Chronic left shoulder pain Yes  ? History of total hip replacement, right   ? Cigarette nicotine dependence without complication   ? ?Return in three months. ? ?Call if any problem. ? ?Precautions discussed. ? ?Electronically Signed ?Sanjuana Kava, MD ?3/16/202310:17 AM ? ?

## 2021-12-07 ENCOUNTER — Other Ambulatory Visit: Payer: Self-pay | Admitting: Physician Assistant

## 2021-12-08 ENCOUNTER — Other Ambulatory Visit: Payer: Self-pay | Admitting: Physician Assistant

## 2021-12-08 ENCOUNTER — Telehealth: Payer: Self-pay | Admitting: *Deleted

## 2021-12-08 MED ORDER — HYDROCODONE-ACETAMINOPHEN 5-325 MG PO TABS: 1.0000 | ORAL_TABLET | Freq: Three times a day (TID) | ORAL | 0 refills | Status: DC | PRN

## 2021-12-08 NOTE — Telephone Encounter (Signed)
Refilled norco.

## 2021-12-08 NOTE — Telephone Encounter (Signed)
Call from patient states he is needing a refill of pain medication. Pharmacy on file. Thank you. ?

## 2021-12-08 NOTE — Telephone Encounter (Signed)
Call to patient and updated that both Methocarbamol and Norco were refilled and sent to pharmacy by PA. Pharmacist informed they would be ready in 30 minutes. ?

## 2021-12-15 DIAGNOSIS — Z1389 Encounter for screening for other disorder: Secondary | ICD-10-CM | POA: Diagnosis not present

## 2021-12-15 DIAGNOSIS — Z79899 Other long term (current) drug therapy: Secondary | ICD-10-CM | POA: Diagnosis not present

## 2021-12-15 DIAGNOSIS — N400 Enlarged prostate without lower urinary tract symptoms: Secondary | ICD-10-CM | POA: Diagnosis not present

## 2021-12-15 DIAGNOSIS — I10 Essential (primary) hypertension: Secondary | ICD-10-CM | POA: Diagnosis not present

## 2021-12-15 DIAGNOSIS — Z1331 Encounter for screening for depression: Secondary | ICD-10-CM | POA: Diagnosis not present

## 2021-12-15 DIAGNOSIS — Z0001 Encounter for general adult medical examination with abnormal findings: Secondary | ICD-10-CM | POA: Diagnosis not present

## 2021-12-15 DIAGNOSIS — F17210 Nicotine dependence, cigarettes, uncomplicated: Secondary | ICD-10-CM | POA: Diagnosis not present

## 2021-12-15 DIAGNOSIS — I4821 Permanent atrial fibrillation: Secondary | ICD-10-CM | POA: Diagnosis not present

## 2021-12-15 DIAGNOSIS — I739 Peripheral vascular disease, unspecified: Secondary | ICD-10-CM | POA: Diagnosis not present

## 2021-12-15 DIAGNOSIS — E7800 Pure hypercholesterolemia, unspecified: Secondary | ICD-10-CM | POA: Diagnosis not present

## 2021-12-17 ENCOUNTER — Telehealth: Payer: Self-pay | Admitting: *Deleted

## 2021-12-17 NOTE — Telephone Encounter (Signed)
Patient called requesting refill of muscle relaxer and pain medication. He states he's out of one of them, I could understand which one. Thank you. ?

## 2021-12-18 ENCOUNTER — Other Ambulatory Visit: Payer: Self-pay | Admitting: Physician Assistant

## 2021-12-18 DIAGNOSIS — I739 Peripheral vascular disease, unspecified: Secondary | ICD-10-CM | POA: Diagnosis not present

## 2021-12-18 DIAGNOSIS — L84 Corns and callosities: Secondary | ICD-10-CM | POA: Diagnosis not present

## 2021-12-18 DIAGNOSIS — L603 Nail dystrophy: Secondary | ICD-10-CM | POA: Diagnosis not present

## 2021-12-18 DIAGNOSIS — M792 Neuralgia and neuritis, unspecified: Secondary | ICD-10-CM | POA: Diagnosis not present

## 2021-12-18 DIAGNOSIS — B351 Tinea unguium: Secondary | ICD-10-CM | POA: Diagnosis not present

## 2021-12-18 MED ORDER — METHOCARBAMOL 500 MG PO TABS: 500.0000 mg | ORAL_TABLET | Freq: Two times a day (BID) | ORAL | 2 refills | Status: DC | PRN

## 2021-12-18 MED ORDER — HYDROCODONE-ACETAMINOPHEN 5-325 MG PO TABS: 1.0000 | ORAL_TABLET | Freq: Every day | ORAL | 0 refills | Status: DC | PRN

## 2021-12-18 NOTE — Telephone Encounter (Signed)
Sent in robaxin with 2 refills.  Weaning frequency of norco.  Will need to come off altogether soon

## 2021-12-18 NOTE — Telephone Encounter (Signed)
Ok, sounds good.  thanks

## 2022-01-06 ENCOUNTER — Ambulatory Visit: Payer: Self-pay | Admitting: General Surgery

## 2022-01-06 DIAGNOSIS — K4091 Unilateral inguinal hernia, without obstruction or gangrene, recurrent: Secondary | ICD-10-CM | POA: Diagnosis not present

## 2022-01-06 NOTE — H&P (Signed)
?Chief Complaint: Hernia ?  ?  ?  ?History of Present Illness: ?Larry Reese is a 76 y.o. male who is seen today as an office consultation at the request of Dr. Erlinda Hong for evaluation of Hernia ?Marland Kitchen   ?Patient is a 76 year old male, with history of heart valve replacement, CAD, recent right hip surgery who comes in secondary to a left inguinal hernia.  He states that this area for several years.  He states is gotten fairly large.  He states that he has no pain or discomfort to the area.  Patient does state that he recently had a hip surgery and wanted to have that done prior to his hernia surgery. ?  ?He had no signs or symptoms of incarceration or strangulation. ?He was previously and recently cleared for hip surgery from his cardiologist Dr. Harrell Gave. ?  ?  ?Review of Systems: ?A complete review of systems was obtained from the patient.  I have reviewed this information and discussed as appropriate with the patient.  See HPI as well for other ROS. ?  ?Review of Systems  ?Constitutional: Negative for fever.  ?HENT: Negative for congestion.   ?Eyes: Negative for blurred vision.  ?Respiratory: Negative for cough, shortness of breath and wheezing.   ?Cardiovascular: Negative for chest pain and palpitations.  ?Gastrointestinal: Negative for heartburn.  ?Genitourinary: Negative for dysuria.  ?Musculoskeletal: Negative for myalgias.  ?Skin: Negative for rash.  ?Neurological: Negative for dizziness and headaches.  ?Psychiatric/Behavioral: Negative for depression and suicidal ideas.  ?All other systems reviewed and are negative. ?  ?  ?  ?Medical History: ?Past Medical History ?Past Medical History: ?Diagnosis Date ? Arthritis   ? Heart valve disease   ? ?  ?  ?There is no problem list on file for this patient. ?  ?  ?Past Surgical History ?Past Surgical History: ?Procedure Laterality Date ? heart vale     ? right hip replacement     ? ?  ?  ?Allergies ?Allergies ?Allergen Reactions ? Celecoxib Anaphylaxis and Other (See  Comments) ?    Celebrex. Nose bleed ?Celebrex. Nose bleed. ?  ? Fluoxetine Hcl Swelling ? Naproxen Other (See Comments) ?    Numbness in extremities ?Numbness in extremities ?  ? ?  ?  ?Current Outpatient Medications on File Prior to Visit ?Medication Sig Dispense Refill ? docusate (COLACE) 100 MG capsule Take by mouth     ? amLODIPine (NORVASC) 10 MG tablet Take 10 mg by mouth once daily     ? HYDROcodone-acetaminophen (NORCO) 7.5-325 mg tablet       ? methocarbamoL (ROBAXIN) 500 MG tablet       ? traZODone (DESYREL) 100 MG tablet       ?  ?No current facility-administered medications on file prior to visit. ?  ?  ?Family History ?History reviewed. No pertinent family history. ?  ?  ?Social History ?  ?Tobacco Use ?Smoking Status Every Day ? Types: Cigarettes ?Smokeless Tobacco Never ?  ?  ?Social History ?Social History ?  ? ?Socioeconomic History ? Marital status: Married ?Tobacco Use ? Smoking status: Every Day ?    Types: Cigarettes ? Smokeless tobacco: Never ?Substance and Sexual Activity ? Alcohol use: Never ? Drug use: Never ? ?  ?  ?Objective: ?  ?  ?Vitals: ?  01/06/22 1012 ?Pulse: 90 ?Temp: 36.8 ?C (98.3 ?F) ?SpO2: 99% ?Weight: 90.1 kg (198 lb 9.6 oz) ?Height: 199.4 cm (6' 6.5") ?  ?Body mass index is 22.66  kg/m?Marland Kitchen ?Physical Exam ?Constitutional:   ?   Appearance: Normal appearance.  ?HENT:  ?   Head: Normocephalic and atraumatic.  ?   Nose: Nose normal. No congestion.  ?   Mouth/Throat:  ?   Mouth: Mucous membranes are moist.  ?   Pharynx: Oropharynx is clear.  ?Eyes:  ?   Pupils: Pupils are equal, round, and reactive to light.  ?Cardiovascular:  ?   Rate and Rhythm: Normal rate and regular rhythm.  ?   Pulses: Normal pulses.  ?   Heart sounds: Normal heart sounds. No murmur heard. ?  No friction rub. No gallop.  ?Pulmonary:  ?   Effort: Pulmonary effort is normal. No respiratory distress.  ?   Breath sounds: Normal breath sounds. No stridor. No wheezing, rhonchi or rales.  ?Abdominal:  ?   General:  Abdomen is flat.  ?   Hernia: A hernia (large) is present. Hernia is present in the left inguinal area. There is no hernia in the right inguinal area.  ?Musculoskeletal:     ?   General: Normal range of motion.  ?   Cervical back: Normal range of motion.  ?Skin: ?   General: Skin is warm and dry.  ?Neurological:  ?   General: No focal deficit present.  ?   Mental Status: He is alert and oriented to person, place, and time.  ?Psychiatric:     ?   Mood and Affect: Mood normal.     ?   Thought Content: Thought content normal.  ?  ?  ?  ?  ?Assessment and Plan: ?Diagnoses and all orders for this visit: ?  ?Unilateral recurrent inguinal hernia without obstruction or gangrene ?  ?  ?Larry Reese is a 76 y.o. male  ?  ?1.  We will proceed to the OR for a open left inguinal hernia repair with mesh. ?2. All risks and benefits were discussed with the patient, to generally include infection, bleeding, damage to surrounding structures, acute and chronic nerve pain, and recurrence. Alternatives were offered and described.  All questions were answered and the patient voiced understanding of the procedure and wishes to proceed at this point. ?  ?  ?  ?  ?  ?  ?No follow-ups on file. ?  ?Ralene Ok, MD, FACS ?[facility], [place] ?General & Minimally Invasive Surgery ? ?

## 2022-01-07 ENCOUNTER — Telehealth: Payer: Self-pay

## 2022-01-07 ENCOUNTER — Other Ambulatory Visit: Payer: Self-pay | Admitting: Physician Assistant

## 2022-01-07 MED ORDER — METHOCARBAMOL 500 MG PO TABS: 500.0000 mg | ORAL_TABLET | Freq: Two times a day (BID) | ORAL | 2 refills | Status: DC | PRN

## 2022-01-07 MED ORDER — HYDROCODONE-ACETAMINOPHEN 5-325 MG PO TABS
1.0000 | ORAL_TABLET | Freq: Every day | ORAL | 0 refills | Status: DC | PRN
Start: 2022-01-07 — End: 2022-01-19

## 2022-01-07 NOTE — Telephone Encounter (Signed)
Pain aware Rx sent to pharm. ? ?

## 2022-01-07 NOTE — Telephone Encounter (Signed)
Sent in meds, but this is last norco rx we can write

## 2022-01-07 NOTE — Telephone Encounter (Signed)
Patient would like refills on pain meds and muscle relaxer. ? ?CB [phone] ?

## 2022-01-12 ENCOUNTER — Telehealth: Payer: Self-pay | Admitting: *Deleted

## 2022-01-12 ENCOUNTER — Ambulatory Visit (INDEPENDENT_AMBULATORY_CARE_PROVIDER_SITE_OTHER): Payer: Medicare HMO | Admitting: Physician Assistant

## 2022-01-12 DIAGNOSIS — Z96641 Presence of right artificial hip joint: Secondary | ICD-10-CM

## 2022-01-12 NOTE — Progress Notes (Signed)
? ?Post-Op Visit Note ?  ?Patient: Larry Reese           ?Date of Birth: 06/14/1946           ?MRN: [id number] ?Visit Date: 01/12/2022 ?PCP: Carrolyn Meiers, MD ? ? ?Assessment & Plan: ? ?Chief Complaint:  ?Chief Complaint  ?Patient presents with  ? Right Hip - Follow-up  ? ?Visit Diagnoses:  ?1. History of hip replacement, total, right   ? ? ?Plan: Patient is a pleasant 76 year old gentleman who comes in today 12 weeks status post right total hip replacement 10/19/2021.  He has been doing relatively well but still notes pain and weakness going from seated to standing position.  He is currently ambulating with a walker.  He does note occasional chronic low back pain with bilateral lower extremity radiculopathy.  This is been bothering him as well.  He is unable to take NSAIDs due to what sounds like an allergy that causes lip swelling and nosebleeds.  Examination of his right hip reveals a painless logroll.  Full hip flexion.  Does have slight weakness with hip flexion.  Positive straight leg raise both sides.  He is neurovascular intact distally.  I believe his pain is coming only from his hip from his low back.  I would like to start him in outpatient physical therapy.  He is agreeable to this plan.  We have also discussed his chronic Norco use and he will now return back to Dr. [name] for future refills.  Follow-up with [place] in 3 months time for repeat evaluation and [facility] pelvis x-rays.  Dental prophylaxis reinforced. ? ?Follow-Up Instructions: Return in about 3 months (around 04/13/2022).  ? ?Orders:  ?Orders Placed This Encounter  ?Procedures  ? Ambulatory referral to Physical Therapy  ? ?No orders of the defined types were placed in this encounter. ? ? ?Imaging: ?No new imaging ? ?PMFS History: ?Patient Active Problem List  ? Diagnosis Date Noted  ? Status post total replacement of right hip 10/19/2021  ? Paroxysmal atrial fibrillation ([place]) 09/04/2020  ? Typical atrial flutter ([place]) 08/20/2020  ?  Secondary hypercoagulable state ([place]) 08/20/2020  ? History of subdural hematoma 06/12/2020  ? PAD (peripheral artery disease) ([place]) 05/27/2020  ? Essential hypertension 05/27/2020  ? Atrial fibrillation ([place]) 05/27/2020  ? Chronic toe pain, right foot 02/20/2019  ? Hematoma of left hip 02/20/2019  ? Avascular necrosis of bone of right hip ([place]) 01/23/2019  ? Pain in left hip 01/23/2019  ? Rib pain on left side 01/23/2019  ? History of colonic polyps   ? Hepatitis, alcoholic 78/46/9629  ? ETOH abuse 02/10/2015  ? Internal hemorrhoids 07/17/2009  ? Colon adenomas 07/15/2009  ? ?Past Medical History:  ?Diagnosis Date  ? A-fib ([place])   ? Anemia   ? Arthritis   ? Bradycardia   ? Dupuytren's contracture of right hand   ? Hx of adenomatous colonic polyps   ? Hypertension   ? Pneumothorax 09/18/1998  ? spontaneous right PTX 09/18/98, s/p chest tube  ? SDH (subdural hematoma) 10/27/2004  ? left chronic SDH versus hygroma 10/27/04 CT  ? Stroke [facility]) 2005  ? left sided weakness  ? Trigger finger   ?  ?Family History  ?Problem Relation Age of Onset  ? Esophageal cancer Father   ? Colon cancer Neg Hx   ?  ?Past Surgical History:  ?Procedure Laterality Date  ? COLONOSCOPY  May 2010  ? Dr. Oneida Alar: 5 cm terminal ileum normal, 6  polyps removed, moderate internal hemorrhoids, simple adenomas, next colonoscopy May 2015  ? COLONOSCOPY N/A 08/01/2015  ? Procedure: COLONOSCOPY;  Surgeon: Danie Binder, MD;  [facility];  Service: Endoscopy;  Laterality: N/A;  0830  ? CRANIOTOMY  2000  ? pressure  ? INGUINAL HERNIA REPAIR Right   ? LEFT ATRIAL APPENDAGE OCCLUSION N/A 09/04/2020  ? Procedure: LEFT ATRIAL APPENDAGE OCCLUSION;  Surgeon: Vickie Epley, MD;  Location: [place] [facility];  Service: Cardiovascular;  Laterality: N/A;  ? OLECRANON BURSECTOMY Right 02/11/2015  ? Procedure: EXCISION RIGHT OLECRANON BURSA;  Surgeon: Sanjuana Kava, MD;  Location: [facility];  Service: Orthopedics;  Laterality: Right;  ? repair of trigger  finger Left   ? pinky  ? TEE WITHOUT CARDIOVERSION N/A 09/04/2020  ? Procedure: TRANSESOPHAGEAL ECHOCARDIOGRAM (TEE);  Surgeon: Vickie Epley, MD;  Location: [place] [facility];  Service: Cardiovascular;  Laterality: N/A;  ? TEE WITHOUT CARDIOVERSION N/A 10/13/2020  ? Procedure: TRANSESOPHAGEAL ECHOCARDIOGRAM (TEE);  Surgeon: Geralynn Rile, MD;  Location: [place];  Service: Cardiovascular;  Laterality: N/A;  ? TOTAL HIP ARTHROPLASTY Right 10/19/2021  ? Procedure: RIGHT TOTAL HIP ARTHROPLASTY ANTERIOR APPROACH;  Surgeon: Leandrew Koyanagi, MD;  Location: [place];  Service: Orthopedics;  Laterality: Right;  3-C  ? ?Social History  ? ?Occupational History  ? Occupation: retired  ?Tobacco Use  ? Smoking status: Every Day  ?  Packs/day: 0.25  ?  Years: 50.00  ?  Pack years: 12.50  ?  Types: Cigarettes  ? Smokeless tobacco: Never  ? Tobacco comments:  ?  5-6 cigarettes per day  ?Vaping Use  ? Vaping Use: Never used  ?Substance and Sexual Activity  ? Alcohol use: Yes  ?  Alcohol/week: 5.0 - 6.0 standard drinks  ?  Types: 5 - 6 Cans of beer per week  ?  Comment: 3-4 beers/day  ? Drug use: No  ? Sexual activity: Yes  ?  Birth control/protection: None  ? ? ? ?

## 2022-01-12 NOTE — Telephone Encounter (Signed)
Ortho bundle 90 day in person meeting and survey completed. ?

## 2022-01-15 DIAGNOSIS — Z96641 Presence of right artificial hip joint: Secondary | ICD-10-CM | POA: Diagnosis not present

## 2022-01-15 DIAGNOSIS — I10 Essential (primary) hypertension: Secondary | ICD-10-CM | POA: Diagnosis not present

## 2022-01-18 ENCOUNTER — Other Ambulatory Visit: Payer: Self-pay | Admitting: Physician Assistant

## 2022-01-18 ENCOUNTER — Telehealth: Payer: Self-pay | Admitting: Orthopaedic Surgery

## 2022-01-18 MED ORDER — METHOCARBAMOL 500 MG PO TABS: 500.0000 mg | ORAL_TABLET | Freq: Two times a day (BID) | ORAL | 2 refills | Status: DC | PRN

## 2022-01-18 NOTE — Telephone Encounter (Signed)
Pt is calling for a refill ? ?Methocarb  ?Hydrocodone  ? ?Please call them in to [facility]  ?

## 2022-01-18 NOTE — Telephone Encounter (Signed)
Dr. [name] is supposed to be doing this refills at this point

## 2022-01-18 NOTE — Telephone Encounter (Signed)
I sent in robaxin.  I can send in tramadol, but nothing stronger for pain

## 2022-01-18 NOTE — Telephone Encounter (Signed)
Appt is scheduled until 03/04/2022 ?

## 2022-01-19 ENCOUNTER — Ambulatory Visit (INDEPENDENT_AMBULATORY_CARE_PROVIDER_SITE_OTHER): Payer: Medicare HMO | Admitting: Family

## 2022-01-19 ENCOUNTER — Encounter (HOSPITAL_BASED_OUTPATIENT_CLINIC_OR_DEPARTMENT_OTHER): Payer: Self-pay | Admitting: Family

## 2022-01-19 VITALS — BP 126/82 | HR 50 | Ht 78.5 in | Wt 197.8 lb

## 2022-01-19 DIAGNOSIS — Z01818 Encounter for other preprocedural examination: Secondary | ICD-10-CM | POA: Diagnosis not present

## 2022-01-19 DIAGNOSIS — I4891 Unspecified atrial fibrillation: Secondary | ICD-10-CM

## 2022-01-19 DIAGNOSIS — Z01810 Encounter for preprocedural cardiovascular examination: Secondary | ICD-10-CM

## 2022-01-19 DIAGNOSIS — I4821 Permanent atrial fibrillation: Secondary | ICD-10-CM | POA: Diagnosis not present

## 2022-01-19 DIAGNOSIS — K4090 Unilateral inguinal hernia, without obstruction or gangrene, not specified as recurrent: Secondary | ICD-10-CM

## 2022-01-19 DIAGNOSIS — I77810 Thoracic aortic ectasia: Secondary | ICD-10-CM | POA: Diagnosis not present

## 2022-01-19 DIAGNOSIS — I10 Essential (primary) hypertension: Secondary | ICD-10-CM | POA: Diagnosis not present

## 2022-01-19 NOTE — Patient Instructions (Signed)
Medication Instructions:  ?Your physician has recommended you make the following change in your medication:  ? ?You may take Acetaminophen (Tylenol) 1,'000mg'$  up to four times per day. ? ?Recommend starting with twice per day and adding in additional doses as needed. ? ?When you resume Hydrocodone per the surgeon's office - discontinue Tylenol. ? ?*If you need a refill on your cardiac medications before your next appointment, please call your pharmacy* ? ? ?Lab Work: ?None ordered today.  ? ?Testing/Procedures: ?Your EKG today showed atrial fibrillation which is a stable finding.  ? ? ?Follow-Up: ?At [facility], you and your health needs are our priority.  As part of our continuing mission to provide you with exceptional heart care, we have created designated Provider Care Teams.  These Care Teams include your primary Cardiologist (physician) and Advanced Practice Providers (APPs -  Physician Assistants and Nurse Practitioners) who all work together to provide you with the care you need, when you need it. ? ?We recommend signing up for the patient portal called "MyChart".  Sign up information is provided on this After Visit Summary.  MyChart is used to connect with patients for Virtual Visits (Telemedicine).  Patients are able to view lab/test results, encounter notes, upcoming appointments, etc.  Non-urgent messages can be sent to your provider as well.   ?To learn more about what you can do with MyChart, go to [email or web address].   ? ?Your next appointment:   ?6 month(s) ? ?The format for your next appointment:   ?In Person ? ?Provider:   ?Buford Dresser, MD  ? ? ?Other Instructions ? ?Heart Healthy Diet Recommendations: ?A low-salt diet is recommended. Meats should be grilled, baked, or boiled. Avoid fried foods. Focus on lean protein sources like fish or chicken with vegetables and fruits. The American Heart Association is a Microbiologist!  American Heart Association Diet and Lifeystyle  Recommendations  ? ?Exercise recommendations: ?The American Heart Association recommends 150 minutes of moderate intensity exercise weekly. ?Try 30 minutes of moderate intensity exercise 4-5 times per week. ?This could include walking, jogging, or swimming. ? ? ? ?Important Information About Sugar ? ? ? ? ?  ?

## 2022-01-19 NOTE — Progress Notes (Signed)
? ?Office Visit  ?  ?Patient Name: Larry Reese ?Date of Encounter: 01/19/2022 ? ?PCP:  Carrolyn Meiers, MD ?  ?[facility]  ?Cardiologist:  Buford Dresser, MD  ?Advanced Practice Provider:  No care team member to display ?Electrophysiologist:  Vickie Epley, MD  ?   ? ?Chief Complaint  ?  ?Larry Reese is a 76 y.o. male with a hx of  tobacco abuse, hypertension, subdural hematoma 2006, persistent atrial fibrillation s/p Watchman device, dilation aortic root  presents today for follow up of atrial fib.  ? ?Past Medical History  ?  ?Past Medical History:  ?Diagnosis Date  ? A-fib ([place])   ? Anemia   ? Arthritis   ? Bradycardia   ? Dupuytren's contracture of right hand   ? Hx of adenomatous colonic polyps   ? Hypertension   ? Pneumothorax 09/18/1998  ? spontaneous right PTX 09/18/98, s/p chest tube  ? SDH (subdural hematoma) ([place]) 10/27/2004  ? left chronic SDH versus hygroma 10/27/04 CT  ? Stroke [facility]) 2005  ? left sided weakness  ? Trigger finger   ? ?Past Surgical History:  ?Procedure Laterality Date  ? COLONOSCOPY  May 2010  ? Dr. Oneida Alar: 5 cm terminal ileum normal, 6 polyps removed, moderate internal hemorrhoids, simple adenomas, next colonoscopy May 2015  ? COLONOSCOPY N/A 08/01/2015  ? Procedure: COLONOSCOPY;  Surgeon: Danie Binder, MD;  [facility];  Service: Endoscopy;  Laterality: N/A;  0830  ? CRANIOTOMY  2000  ? pressure  ? INGUINAL HERNIA REPAIR Right   ? LEFT ATRIAL APPENDAGE OCCLUSION N/A 09/04/2020  ? Procedure: LEFT ATRIAL APPENDAGE OCCLUSION;  Surgeon: Vickie Epley, MD;  Location: [place] CV LAB;  Service: Cardiovascular;  Laterality: N/A;  ? OLECRANON BURSECTOMY Right 02/11/2015  ? Procedure: EXCISION RIGHT OLECRANON BURSA;  Surgeon: Sanjuana Kava, MD;  Location: [facility] ORS;  Service: Orthopedics;  Laterality: Right;  ? repair of trigger finger Left   ? pinky  ? TEE WITHOUT CARDIOVERSION N/A 09/04/2020  ? Procedure: TRANSESOPHAGEAL  ECHOCARDIOGRAM (TEE);  Surgeon: Vickie Epley, MD;  Location: [place] CV LAB;  Service: Cardiovascular;  Laterality: N/A;  ? TEE WITHOUT CARDIOVERSION N/A 10/13/2020  ? Procedure: TRANSESOPHAGEAL ECHOCARDIOGRAM (TEE);  Surgeon: Geralynn Rile, MD;  Location: [place];  Service: Cardiovascular;  Laterality: N/A;  ? TOTAL HIP ARTHROPLASTY Right 10/19/2021  ? Procedure: RIGHT TOTAL HIP ARTHROPLASTY ANTERIOR APPROACH;  Surgeon: Leandrew Koyanagi, MD;  Location: [place];  Service: Orthopedics;  Laterality: Right;  3-C  ? ? ?Allergies ? ?Allergies  ?Allergen Reactions  ? Aleve [Naproxen] Other (See Comments)  ?  Numbness in extremities ?  ? Celecoxib Other (See Comments)  ?  Celebrex. Nose bleed.  ? Fluoxetine Hcl Swelling  ? ? ?History of Present Illness  ?  ?Larry Reese is a 76 y.o. male with a hx of tobacco abuse, hypertension, subdural hematoma 2006, persistent atrial fibrillation s/p Watchman device, dilation aortic root last seen 07/01/21. ? ?At a preop visit August 2021 he was noted to be in new onset atrial fibrillation.  He was referred to cardiology.  He had prior subdural hematoma in 2006.  As such anticoagulation was deferred and watchman was placed.  Most recent echo 05/2021 LVEF 60 to 65%, no RWMA, RV normal size and function, LA mildly dilated, trivial MR, mild dilation aortic root 40 mm.  Monitor 05/2021 with continuous atrial fibrillation 31 bpm - 114 bpm  with average of 63 bpm.  1 pause of 3 seconds at 3:44 AM, PVC burden less than 1%. ? ?He presents today for follow-up with wife.  Tells me January 31 he underwent hip transplant.  He is walking better but not twisting or bending well.  He is starting outpatient physical therapy at [facility] soon.  He notes shortness of breath only with the cold weather and this is unchanged compared to previous.  Denies chest pain, pressure, tightness.  He is bradycardic in clinic today but denies lightheadedness, dizziness, near-syncope, syncope.  Heart  rate at home routinely 50s-80s.  Has upcoming left inguinal hernia repair with Dr. Rosendo Gros. ? ?EKGs/Labs/Other Studies Reviewed:  ? ?The following studies were reviewed today: ? ? ?EKG:  EKG is  ordered today.  The ekg ordered today demonstrates atrial fibrillation 50 bpm. ? ?Recent Labs: ?10/15/2021: ALT 14 ?10/20/2021: BUN 13; Creatinine, Ser 1.34; Hemoglobin 12.2; Platelets 221; Potassium 4.1; Sodium 132  ?Recent Lipid Panel ?No results found for: CHOL, TRIG, HDL, CHOLHDL, VLDL, LDLCALC, LDLDIRECT ? ? ?Home Medications  ? ?Current Meds  ?Medication Sig  ? amLODipine (NORVASC) 10 MG tablet Take 10 mg by mouth daily.  ? cholecalciferol (VITAMIN D3) 25 MCG (1000 UNIT) tablet Take 1,000 Units by mouth daily.  ? docusate sodium (COLACE) 100 MG capsule Take 1 capsule (100 mg total) by mouth daily as needed.  ? methocarbamol (ROBAXIN) 500 MG tablet Take 1 tablet (500 mg total) by mouth 2 (two) times daily as needed.  ? tamsulosin (FLOMAX) 0.4 MG CAPS capsule Take 0.4 mg by mouth daily.   ? traZODone (DESYREL) 100 MG tablet Take 100 mg by mouth at bedtime as needed.  ?  ? ?Review of Systems  ?    ?All other systems reviewed and are otherwise negative except as noted above. ? ?Physical Exam  ?  ?VS:  BP 126/82 (BP Location: Left Arm, Patient Position: Sitting, Cuff Size: Normal)   Pulse (!) 50   Ht 6' 6.5" (1.994 m)   Wt 197 lb 12.8 oz (89.7 kg)   BMI 22.57 kg/m?  , BMI Body mass index is 22.57 kg/m?. ? ?Wt Readings from Last 3 Encounters:  ?01/19/22 197 lb 12.8 oz (89.7 kg)  ?12/03/21 204 lb (92.5 kg)  ?10/19/21 204 lb (92.5 kg)  ?  ?GEN: Well nourished, well developed, in no acute distress. ?HEENT: normal. ?Neck: Supple, no JVD, carotid bruits, or masses. ?Cardiac: IRIR, no murmurs, rubs, or gallops. No clubbing, cyanosis, edema.  Radials/PT 2+ and equal bilaterally.  ?Respiratory:  Respirations regular and unlabored, clear to auscultation bilaterally. ?GI: Soft, nontender, nondistended. ?MS: No deformity or  atrophy. ?Skin: Warm and dry, no rash. ?Neuro:  Strength and sensation are intact. ?Psych: Normal affect. ? ?Assessment & Plan  ?  ?Preop -upcoming left inguinal hernia repair with Dr. Rosendo Gros.According to the Revised Cardiac Risk Index (RCRI), his Perioperative Risk of Major Cardiac Event is (%): 0.9 .His Functional Capacity in METs is: 4.95 according to the [facility] Activity Status Index (DASI).  He is s is deemed acceptable risk for the planned procedure and may proceed without additional cardiovascular testing. ? ?Atrial fibrillation - s/p Watchman due to hx of subdural hematoma. No need for AV nodal blocking agents as he is bradycardic.  EKG today atrial fibrillation 50 bpm.  He is asymptomatic in regards to his bradycardia with no lightheadedness, dizziness, and syncope, syncope.  Monitor 05/2021 with only one 3-second pause which occurred at 3 AM.  No  indication for PPM at this time.  Continue to follow with EP and Dr. Harrell Gave. ? ?HTN-BP at goal of less than 130/80 on amlodipine 10 mg daily. ? ?Dilation aortic root -Echo 05/2021 mild dilation aortic root 40 mm.  Continue optimal blood pressure control.  Consider repeat echo for monitoring 05/2023. ? ?  ? ?Disposition: Follow up in 6 month(s) with Buford Dresser, MD or APP. ? ?Signed, ?Loel Dubonnet, NP ?01/19/2022, 1:18 PM ?[place] ?

## 2022-01-20 NOTE — Telephone Encounter (Signed)
Would like Tramadol sent into pharm. ?

## 2022-01-21 ENCOUNTER — Other Ambulatory Visit: Payer: Self-pay | Admitting: Physician Assistant

## 2022-01-21 MED ORDER — TRAMADOL HCL 50 MG PO TABS: 50.0000 mg | ORAL_TABLET | Freq: Two times a day (BID) | ORAL | 0 refills | Status: DC | PRN

## 2022-01-21 NOTE — Telephone Encounter (Signed)
Sent in

## 2022-01-29 ENCOUNTER — Ambulatory Visit (HOSPITAL_COMMUNITY): Payer: Medicare HMO | Attending: Physician Assistant | Admitting: Physical Therapy

## 2022-01-29 ENCOUNTER — Encounter (HOSPITAL_COMMUNITY): Payer: Self-pay | Admitting: Physical Therapy

## 2022-01-29 DIAGNOSIS — Z96641 Presence of right artificial hip joint: Secondary | ICD-10-CM | POA: Diagnosis not present

## 2022-01-29 DIAGNOSIS — M6281 Muscle weakness (generalized): Secondary | ICD-10-CM | POA: Diagnosis not present

## 2022-01-29 DIAGNOSIS — R262 Difficulty in walking, not elsewhere classified: Secondary | ICD-10-CM | POA: Diagnosis not present

## 2022-01-29 DIAGNOSIS — M25651 Stiffness of right hip, not elsewhere classified: Secondary | ICD-10-CM | POA: Diagnosis not present

## 2022-01-29 NOTE — Therapy (Signed)
?OUTPATIENT PHYSICAL THERAPY LOWER EXTREMITY EVALUATION ? ? ?Patient Name: Larry Reese ?MRN: [id number] ?DOB:[birth year], 76 y.o., male ?Today's Date: 01/29/2022 ? ? PT End of Session - 01/29/22 1412   ? ? Visit Number 1   ? Number of Visits 12   ? Date for PT Re-Evaluation 03/12/22   ? Authorization Type [employer]   ? Authorization Time Period authorization requested   ? Progress Note Due on Visit 10   ? PT Start Time 1415   ? PT Stop Time 1505   ? PT Time Calculation (min) 50 min   ? Activity Tolerance Patient tolerated treatment well   ? Behavior During Therapy [facility] for tasks assessed/performed   ? ?  ?  ? ?  ? ? ?Past Medical History:  ?Diagnosis Date  ? A-fib ([place])   ? Anemia   ? Arthritis   ? Bradycardia   ? Dupuytren's contracture of right hand   ? Hx of adenomatous colonic polyps   ? Hypertension   ? Pneumothorax 09/18/1998  ? spontaneous right PTX 09/18/98, s/p chest tube  ? SDH (subdural hematoma) ([place]) 10/27/2004  ? left chronic SDH versus hygroma 10/27/04 CT  ? Stroke [facility]) 2005  ? left sided weakness  ? Trigger finger   ? ?Past Surgical History:  ?Procedure Laterality Date  ? COLONOSCOPY  May 2010  ? Dr. Oneida Alar: 5 cm terminal ileum normal, 6 polyps removed, moderate internal hemorrhoids, simple adenomas, next colonoscopy May 2015  ? COLONOSCOPY N/A 08/01/2015  ? Procedure: COLONOSCOPY;  Surgeon: Danie Binder, MD;  [facility];  Service: Endoscopy;  Laterality: N/A;  0830  ? CRANIOTOMY  2000  ? pressure  ? INGUINAL HERNIA REPAIR Right   ? LEFT ATRIAL APPENDAGE OCCLUSION N/A 09/04/2020  ? Procedure: LEFT ATRIAL APPENDAGE OCCLUSION;  Surgeon: Vickie Epley, MD;  Location: [place] CV LAB;  Service: Cardiovascular;  Laterality: N/A;  ? OLECRANON BURSECTOMY Right 02/11/2015  ? Procedure: EXCISION RIGHT OLECRANON BURSA;  Surgeon: Sanjuana Kava, MD;  Location: [facility] OR[facility];  Service: Orthopedics;  Laterality: Right;  ? repair of trigger finger Left   ? pinky  ? TEE WITHOUT CARDIOVERSION N/A  09/04/2020  ? Procedure: TRANSESOPHAGEAL ECHOCARDIOGRAM (TEE);  Surgeon: Vickie Epley, MD;  Location: [place] CV LAB;  Service: Cardiovascular;  Laterality: N/A;  ? TEE WITHOUT CARDIOVERSION N/A 10/13/2020  ? Procedure: TRANSESOPHAGEAL ECHOCARDIOGRAM (TEE);  Surgeon: Geralynn Rile, MD;  Location: [place];  Service: Cardiovascular;  Laterality: N/A;  ? TOTAL HIP ARTHROPLASTY Right 10/19/2021  ? Procedure: RIGHT TOTAL HIP ARTHROPLASTY ANTERIOR APPROACH;  Surgeon: Leandrew Koyanagi, MD;  Location: [place];  Service: Orthopedics;  Laterality: Right;  3-C  ? ?Patient Active Problem List  ? Diagnosis Date Noted  ? Status post total replacement of right hip 10/19/2021  ? Paroxysmal atrial fibrillation ([place]) 09/04/2020  ? Typical atrial flutter ([place]) 08/20/2020  ? Secondary hypercoagulable state ([place]) 08/20/2020  ? History of subdural hematoma 06/12/2020  ? PAD (peripheral artery disease) ([place]) 05/27/2020  ? Essential hypertension 05/27/2020  ? Atrial fibrillation ([place]) 05/27/2020  ? Chronic toe pain, right foot 02/20/2019  ? Hematoma of left hip 02/20/2019  ? Avascular necrosis of bone of right hip ([place]) 01/23/2019  ? Pain in left hip 01/23/2019  ? Rib pain on left side 01/23/2019  ? History of colonic polyps   ? Hepatitis, alcoholic 32/99/2426  ? ETOH abuse 02/10/2015  ? Internal hemorrhoids 07/17/2009  ? Colon adenomas 07/15/2009  ? ? ?  PCP: Dr. Legrand Rams ? ?REFERRING PROVIDER: Aundra Dubin, PA-C  ? ?REFERRING DIAG: Rt anterior approach THR due to necrosis ? ?THERAPY DIAG:  ?Difficulty in walking, not elsewhere classified ? ?Muscle weakness (generalized) ? ?ONSET DATE: 10/19/2021 ? ?SUBJECTIVE:  ? ?SUBJECTIVE STATEMENT:    Pt states that he had [place] for 2 weeks following his operation.  He states that prior to his hip bothering him he was not walking with a cane.  He wants to be able to put his socks on , bend over and pick items off the floor and complete yard work.  ? ? ?PERTINENT HISTORY: ?A Fib, PAD,  HTN ? ?PAIN:  ?Are you having pain? 2/10. Worst  Pain is when he immediately stands up pain is about an 8/10  ? ?PRECAUTIONS: None ? ?WEIGHT BEARING RESTRICTIONS No ? ?FALLS:  ?Has patient fallen in last 6 months? Yes. Number of falls 2 Both prior to surgery ? ?LIVING ENVIRONMENT: ?Lives with: lives with their spouse ?Lives in: House/apartment ?Stairs: Yes: Internal: 3 steps; grab bars  ?Has following equipment at home: Single point cane ? ?OCCUPATION: retired  ? ?PLOF: Independent ? ?PATIENT GOALS get back to his yard work, walking  ? ? ?OBJECTIVE:  ? ? ? ?PATIENT SURVEYS:  ?FOTO 35 ? ?COGNITION: ? Overall cognitive status: Within functional limits for tasks assessed   ?  ?SENSATION: ?Not tested ? ? ? ?POSTURE:  ?Flat back , forward bent  ? ?LE ROM: ? ?Active ROM Right ?01/29/2022 Left ?01/29/2022  ?Hip flexion 90 110  ? ?LE MMT: ? ?MMT Right ?01/29/2022 Left ?01/29/2022  ?Hip flexion    ?Hip extension 2/5 3/5  ?Hip abduction 4/5 4+/5  ?Hip adduction    ?Hip internal rotation    ?Hip external rotation    ?Knee flexion 5/5 4/5  ?Knee extension 5/5 5/5  ?Ankle dorsiflexion 3+/5 3-/5   ?Ankle plantarflexion    ?Ankle inversion    ?Ankle eversion    ? (Blank rows = not tested) ? ? ?FUNCTIONAL TESTS:  ?30 seconds chair stand test  Pt unable to come sit to stand without use of B UE.  With B UE 6 x  ?2 minute walk test: 262 with decreased stride length on LT, decreased arm swing on Left, decreased ankle Dorsi and plantar flexion B  ?Single leg stance: Rt 1", LT 2" ? ? ? ? ?TODAY'S TREATMENT: ?Sit to stand with hands x10 ?Sitting ankle pumps x 10  ?Bridge x 10  ?           Knee to chest stretch 30" x 2 ? ?PATIENT EDUCATION:  ?Education details: HEP ?Person educated: Patient and Spouse ?Education method: Explanation and Demonstration ?Education comprehension: verbalized understanding and returned demonstration ? ? ?HOME EXERCISE PROGRAM: ?Sit to stand with hands x10 ?Sitting ankle pumps x 10  ?Bridge x 10  ?           Knee to  chest stretch 30" x 2 ? ? ?ASSESSMENT: ? leg stan ?CLINICAL IMPRESSION: ?Patient is a 76 y.o. male  who was seen today for physical therapy evaluation and treatment for status post Rt total hip replacement,(anterior approach). Evaluation demonstrates decreased balance, decreased mm strength, decreased activity tolerance, abnormal gait.  Mr. Seckman will benefit from skilled PT to address these deficits and return him to his normal functioning level of walking and working in his yard.  ? ? ?OBJECTIVE IMPAIRMENTS Abnormal gait, decreased activity tolerance, decreased balance, difficulty walking, decreased ROM, decreased strength, and  pain.  ? ?ACTIVITY LIMITATIONS community activity and yard work.  ? ?PERSONAL FACTORS 1 comorbidity: PAD  are also affecting patient's functional outcome.  ? ? ?REHAB POTENTIAL: Good ? ?CLINICAL DECISION MAKING: Stable/uncomplicated ? ?EVALUATION COMPLEXITY: Low ? ? ?GOALS: ?Goals reviewed with patient? No ? ?SHORT TERM GOALS: Target date: 02/26/2022 (pt will be out of PT for 2 weeks due to surgery on 5/24) ?          1.   Pt to be I ing HEP to decrease his pain to no greater than a 5/10  ?Baseline: ?Goal status: INITIAL ? ?2.  Pt Rt hip ROM to improve to allow pt to be able to don and doff his shoes and socks.  ?Baseline:  ?Goal status: INITIAL ? ?3.  Pt to be able to single leg stance for 8 seconds to reduce pt risk of falling  ?Baseline:  ?Goal status: INITIAL ? ?4.  PT to be able to walk for 15 minutes with his cane without difficulty ?Baseline:  ?Goal status: INITIAL ? ? ? ?LONG TERM GOALS: Target date: 03/26/2022  pt will be out of PT for 2 weeks due to surgery on 5/24) ? ?PT to be I in an advanced HEP to decrease pain to no greater than a 2/10 ?Baseline:  ?Goal status: INITIAL ? ?2.  Pt to be able to come sit to stand without using UE assist 12x in 30 seconds to demonstrate improved core/LE strength.  ?Baseline:  ?Goal status: INITIAL ? ?3.  Pt mm strength increased one grade to be  able to go up and down 8 steps in a reciprocal manner using one handrail for balance.  ?Baseline:  ?Goal status: INITIAL ? ?4.  PT to be able to single leg stance at least 12 seconds B to have the confidence to be

## 2022-02-02 NOTE — Pre-Procedure Instructions (Signed)
Surgical Instructions ? ? ? Your procedure is scheduled on Tuesday 02/09/22. ? ? Report to [facility] Main Entrance "A" at 05:30 A.M., then check in with the Admitting office. ? Call this number if you have problems the morning of surgery: ? [phone] ? ? If you have any questions prior to your surgery date call [phone]: Open Monday-Friday 8am-4pm ? ? ? Remember: ? Do not eat after midnight the night before your surgery ? ?You may drink clear liquids until 04:30 A.M. the morning of your surgery.   ?Clear liquids allowed are: Water, Non-Citrus Juices (without pulp), Carbonated Beverages, Clear Tea, Black Coffee ONLY (NO MILK, CREAM OR POWDERED CREAMER of any kind), and Gatorade ? ?Patient Instructions ? ?The night before surgery:  ?No food after midnight. ONLY clear liquids after midnight ? ?The day of surgery (if you do NOT have diabetes):  ?Drink ONE (1) Pre-Surgery Clear Ensure by 04:30 A.M. the morning of surgery. Drink in one sitting. Do not sip.  ?This drink was given to you during your hospital  ?pre-op appointment visit. ? ?Nothing else to drink after completing the  ?Pre-Surgery Clear Ensure. ? ?       If you have questions, please contact your surgeon?s office. ? ?  ? Take these medicines the morning of surgery with A SIP OF WATER:  ? amLODipine (NORVASC)  ? tamsulosin [facility]) ? ? Take these medicines if needed:  ? docusate sodium (COLACE)  ? methocarbamol (ROBAXIN)  ? traMADol [facility]) ? ? ? ?As of today, STOP taking any Aspirin (unless otherwise instructed by your surgeon) Aleve, Naproxen, Ibuprofen, Motrin, Advil, Goody's, BC's, all herbal medications, fish oil, and all vitamins. ? ?         ?Do not wear jewelry or makeup ?Do not wear lotions, powders, perfumes/colognes, or deodorant. ?Do not shave 48 hours prior to surgery.  Men may shave face and neck. ?Do not bring valuables to the hospital. ?Do not wear nail polish, gel polish, artificial nails, or any other type of covering on natural nails  (fingers and toes) ?If you have artificial nails or gel coating that need to be removed by a nail salon, please have this removed prior to surgery. Artificial nails or gel coating may interfere with anesthesia's ability to adequately monitor your vital signs. ? ?[facility] is not responsible for any belongings or valuables. .  ? ?Do NOT Smoke (Tobacco/Vaping)  24 hours prior to your procedure ? ?If you use a CPAP at night, you may bring your mask for your overnight stay. ?  ?Contacts, glasses, hearing aids, dentures or partials may not be worn into surgery, please bring cases for these belongings ?  ?For patients admitted to the hospital, discharge time will be determined by your treatment team. ?  ?Patients discharged the day of surgery will not be allowed to drive home, and someone needs to stay with them for 24 hours. ? ? ?SURGICAL WAITING ROOM VISITATION ?Patients having surgery or a procedure in a hospital may have two support people. ?Children under the age of 62 must have an adult with them who is not the patient. ?They may stay in the waiting area during the procedure and may switch out with other visitors. If the patient needs to stay at the hospital during part of their recovery, the visitor guidelines for inpatient rooms apply. ? ?Please refer to the [facility] website for the visitor guidelines for Inpatients (after your surgery is over and you are in a regular  room).  ? ? ? ? ? ?Special instructions:   ? ?Oral Hygiene is also important to reduce your risk of infection.  Remember - BRUSH YOUR TEETH THE MORNING OF SURGERY WITH YOUR REGULAR TOOTHPASTE ? ? ?[facility]- Preparing For Surgery ? ?Before surgery, you can play an important role. Because skin is not sterile, your skin needs to be as free of germs as possible. You can reduce the number of germs on your skin by washing with CHG (chlorahexidine gluconate) Soap before surgery.  CHG is an antiseptic cleaner which kills germs and bonds with the skin  to continue killing germs even after washing.   ? ? ?Please do not use if you have an allergy to CHG or antibacterial soaps. If your skin becomes reddened/irritated stop using the CHG.  ?Do not shave (including legs and underarms) for at least 48 hours prior to first CHG shower. It is OK to shave your face. ? ?Please follow these instructions carefully. ?  ? ? Shower the NIGHT BEFORE SURGERY and the MORNING OF SURGERY with CHG Soap.  ? If you chose to wash your hair, wash your hair first as usual with your normal shampoo. After you shampoo, rinse your hair and body thoroughly to remove the shampoo.  Then [employer] and genitals (private parts) with your normal soap and rinse thoroughly to remove soap. ? ?After that Use CHG Soap as you would any other liquid soap. You can apply CHG directly to the skin and wash gently with a scrungie or a clean washcloth.  ? ?Apply the CHG Soap to your body ONLY FROM THE NECK DOWN.  Do not use on open wounds or open sores. Avoid contact with your eyes, ears, mouth and genitals (private parts). Wash Face and genitals (private parts)  with your normal soap.  ? ?Wash thoroughly, paying special attention to the area where your surgery will be performed. ? ?Thoroughly rinse your body with warm water from the neck down. ? ?DO NOT shower/wash with your normal soap after using and rinsing off the CHG Soap. ? ?Pat yourself dry with a CLEAN TOWEL. ? ?Wear CLEAN PAJAMAS to bed the night before surgery ? ?Place CLEAN SHEETS on your bed the night before your surgery ? ?DO NOT SLEEP WITH PETS. ? ? ?Day of Surgery: ? ?Take a shower with CHG soap. ?Wear Clean/Comfortable clothing the morning of surgery ?Do not apply any deodorants/lotions.   ?Remember to brush your teeth WITH YOUR REGULAR TOOTHPASTE. ? ? ? ?If you received a COVID test during your pre-op visit, it is requested that you wear a mask when out in public, stay away from anyone that may not be feeling well, and notify your surgeon if you  develop symptoms. If you have been in contact with anyone that has tested positive in the last 10 days, please notify your surgeon. ? ?  ?Please read over the following fact sheets that you were given.  ? ?

## 2022-02-03 ENCOUNTER — Encounter (HOSPITAL_COMMUNITY)
Admission: RE | Admit: 2022-02-03 | Discharge: 2022-02-03 | Disposition: A | Discharge status: Home / Self Care | Payer: Medicare HMO | Source: Ambulatory Visit | Attending: General Surgery | Admitting: General Surgery

## 2022-02-03 ENCOUNTER — Other Ambulatory Visit: Payer: Self-pay

## 2022-02-03 ENCOUNTER — Encounter (HOSPITAL_COMMUNITY): Payer: Self-pay

## 2022-02-03 ENCOUNTER — Other Ambulatory Visit: Payer: Self-pay | Admitting: Physician Assistant

## 2022-02-03 VITALS — BP 129/80 | HR 65 | Temp 98.1°F | Resp 18 | Ht 78.5 in | Wt 197.6 lb

## 2022-02-03 DIAGNOSIS — K4090 Unilateral inguinal hernia, without obstruction or gangrene, not specified as recurrent: Secondary | ICD-10-CM | POA: Diagnosis not present

## 2022-02-03 DIAGNOSIS — Z87891 Personal history of nicotine dependence: Secondary | ICD-10-CM | POA: Insufficient documentation

## 2022-02-03 DIAGNOSIS — Z01812 Encounter for preprocedural laboratory examination: Secondary | ICD-10-CM | POA: Insufficient documentation

## 2022-02-03 DIAGNOSIS — Z8673 Personal history of transient ischemic attack (TIA), and cerebral infarction without residual deficits: Secondary | ICD-10-CM | POA: Insufficient documentation

## 2022-02-03 DIAGNOSIS — Z01818 Encounter for other preprocedural examination: Secondary | ICD-10-CM

## 2022-02-03 DIAGNOSIS — I4891 Unspecified atrial fibrillation: Secondary | ICD-10-CM | POA: Insufficient documentation

## 2022-02-03 DIAGNOSIS — I10 Essential (primary) hypertension: Secondary | ICD-10-CM | POA: Diagnosis not present

## 2022-02-03 HISTORY — DX: Thoracic aortic ectasia: I77.810

## 2022-02-03 LAB — CBC
HCT: 35.2 % — ABNORMAL LOW (ref 39.0–52.0)
Hemoglobin: 11.6 g/dL — ABNORMAL LOW (ref 13.0–17.0)
MCH: 30.5 pg (ref 26.0–34.0)
MCHC: 33 g/dL (ref 30.0–36.0)
MCV: 92.6 fL (ref 80.0–100.0)
Platelets: 310 10*3/uL (ref 150–400)
RBC: 3.8 MIL/uL — ABNORMAL LOW (ref 4.22–5.81)
RDW: 13.9 % (ref 11.5–15.5)
WBC: 4.6 10*3/uL (ref 4.0–10.5)
nRBC: 0 % (ref 0.0–0.2)

## 2022-02-03 LAB — BASIC METABOLIC PANEL
Anion gap: 6 (ref 5–15)
BUN: 11 mg/dL (ref 8–23)
CO2: 25 mmol/L (ref 22–32)
Calcium: 9.2 mg/dL (ref 8.9–10.3)
Chloride: 106 mmol/L (ref 98–111)
Creatinine, Ser: 1.14 mg/dL (ref 0.61–1.24)
GFR, Estimated: >60 mL/min (ref >60)
Glucose, Bld: 104 mg/dL — ABNORMAL HIGH (ref 70–99)
Potassium: 3.6 mmol/L (ref 3.5–5.1)
Sodium: 137 mmol/L (ref 135–145)

## 2022-02-03 NOTE — Progress Notes (Signed)
PCP - Dr. [name] ?Cardiologist - Dr. Buford Dresser ? ?PPM/ICD - n/a ?  ?Chest x-ray - 05/22/21 ?EKG - 01/19/22 ?Stress Test - denies ?ECHO - 06/02/21 ?Cardiac Cath - denies ? ?Sleep Study - denies ?  ?Patient is not diabetic ? ?Blood Thinner Instructions: n/a ?Aspirin Instructions: n/a ? ?ERAS Protcol - Clear liquids until 0430 am day of surgery ?PRE-SURGERY Ensure or G2-  Ensure ? ?COVID TEST- n/a ? ? ?Anesthesia review: Yes. Cardiac History ? ?Patient denies shortness of breath, fever, cough and chest pain at PAT appointment ? ? ?All instructions explained to the patient, with a verbal understanding of the material. Patient agrees to go over the instructions while at home for a better understanding. The opportunity to ask questions was provided. ? ? ?

## 2022-02-04 ENCOUNTER — Encounter (HOSPITAL_COMMUNITY): Payer: Self-pay

## 2022-02-04 NOTE — Progress Notes (Addendum)
Anesthesia Chart Review:  Case: [id number] Date/Time: 02/09/22 0715   Procedure: OPEN LEFT INGUINAL HERNIA REPAIR WITH MESH (Left)   Anesthesia type: General   Pre-op diagnosis: LEFT INGUINAL HERNIA   Location: [facility] ROOM 10 / [facility]   Surgeons: Ralene Ok, MD       DISCUSSION: Patient is a 76 year old male scheduled for the above procedure. S/p right THA 10/19/21.    History includes smoking, HTN, afib (diagnosed 05/05/20, s/p Watchman LAA occlusive device 09/04/20), bradycardia, CVA (2005, left hemiparesis), anemia, spontaneous right pneumothorax (09/18/98), SDH (left chronic SDH versus hygroma 10/27/04, admitted to [facility]; history of craniotomy), hernia (s/p right IHR 08/18/05), right THA (10/19/21). Reported history of CVA with left hemiparesis ~ 2005, unclear if this is referring to [place] 2006. Of note, he has had dilated ascending thoracic aorta dating back to at least 2018--max 4.8 cm on 08/11/17 (results discussed with patient then by ED provider with f/u advised) and 07/21/20 chest CT scan measured aortic root as 4.7 cm and ascending aorta as 4.8 cm, although 06/02/21 TTE measured the aortic root as 3.8 cm and ascending aorta as 4.0 cm. Cardiology is following.   Last cardiology visit was on 01/19/22 was with Laurann Montana, NP for afib follow-up and preoperative evaluation. She wrote: "Preop -upcoming left inguinal hernia repair with Dr. Rosendo Gros.According to the Revised Cardiac Risk Index (RCRI), his Perioperative Risk of Major Cardiac Event is (%): 0.9 .His Functional Capacity in METs is: 4.95 according to the Duke Activity Status Index (DASI).  He is s is deemed acceptable risk for the planned procedure and may proceed without additional cardiovascular testing." He remained in afib with rate in the 50's, asymptomatic. No indication for PPM at this time, and is also followed by EP. Consider repeating echo next year for aortic root dilation.   Anesthesia team to evaluate on the day of surgery.      VS: BP 129/80   Pulse 65   Temp 36.7 C (Oral)   Resp 18   Ht 6' 6.5" (1.994 m)   Wt 89.6 kg   SpO2 100%   BMI 22.55 kg/m    PROVIDERS: - [name], [name], MD is PCP  - Buford Dresser, MD is cardiologist. As above last follow-up 01/19/22 with Laurann Montana, NP. Prior to January 10/19/21 right THA, I did communicate with Dr. Harrell Gave regarding TAA. She did not think he needed urgent referral to CT surgery at that time given stable imaging, but would need monitoring over time. By 05/2021 echo aortic root measured 3.80 cm and ascending aorta measured 4.00 cm which was smaller than previous chest CT. Repeat imaging in 2024 will be considered--will reach out to [facility]  but would defer imaging modality recommendations to cardiology since they are following. (UPDATE 02/05/22 9:30 AM: I received communication from Melina Copa, PA-C indicating that Dr. Harrell Gave did not think his TAA needed to be re-evaluated prior to surgery, and Urban Gibson can review further on her return. Modality for future TAA follow-up to be determined.) - Lars Mage, MD is EP cardiologist. Last visit 03/10/21 with Piedad Climes, PA-C. Plavix discontinued. ASA 81 mg continued.  Six month follow-up recommended.   LABS: Labs reviewed: Acceptable for surgery. (all labs ordered are listed, but only abnormal results are displayed)  Labs Reviewed  CBC - Abnormal; Notable for the following components:      Result Value   RBC 3.80 (*)    Hemoglobin 11.6 (*)    HCT 35.2 (*)  All other components within normal limits  BASIC METABOLIC PANEL - Abnormal; Notable for the following components:   Glucose, Bld 104 (*)    All other components within normal limits     IMAGES: CXR 05/21/21: FINDINGS: The heart size and mediastinal contours are within normal limits. Stable bibasilar scarring is noted. No acute abnormality is noted. The visualized skeletal structures are unremarkable. IMPRESSION: Stable  bibasilar scarring.  No acute abnormality is noted.   CT Chest (as part of CT Cardiac) 07/21/20: IMPRESSION: 1. Aortic atherosclerosis with aneurysmal dilatation of the ascending thoracic aorta (4.8 cm in diameter). Ascending thoracic aortic aneurysm. Recommend semi-annual imaging followup by CTA or MRA and referral to cardiothoracic surgery if not already obtained. This recommendation follows 2010 ACCF/AHA/AATS/ACR/ASA/SCA/SCAI/SIR/STS/SVM Guidelines for the Diagnosis and Management of Patients With Thoracic Aortic Disease. Circulation. 2010; 121: G401-U272. Aortic aneurysm NOS (ICD10-I71.9). 2. Areas of bronchiectasis and post infectious or inflammatory scarring in the lower lobes of the lungs bilaterally, as above. Aortic Atherosclerosis (ICD10-I70.0). Aortic aneurysm NOS (ICD10-I71.9).     EKG:  EKG 01/19/22 ([facility]): Afib at 50 bpm. Septal infarct (age undetermined).     CV: Echo 06/02/21: IMPRESSIONS   1. Left ventricular ejection fraction, by estimation, is 60 to 65%. The  left ventricle has normal function. The left ventricle has no regional  wall motion abnormalities. Left ventricular diastolic function could not  be evaluated.   2. Right ventricular systolic function is normal. The right ventricular  size is normal. There is normal pulmonary artery systolic pressure. The  estimated right ventricular systolic pressure is 53.6 mmHg.   3. Left atrial size was mildly dilated.   4. The mitral valve is normal in structure. Trivial mitral valve  regurgitation. No evidence of mitral stenosis.   5. The aortic valve is tricuspid. Aortic valve regurgitation is not  visualized. No aortic stenosis is present.   6. Aortic dilatation noted. There is mild dilatation of the aortic root,  measuring 40 mm. [Ao Root diam: 3.80 cm; Ao Asc diam: 4.00 cm]  7. The inferior vena cava is normal in size with greater than 50%  respiratory variability, suggesting right atrial pressure of 3  mmHg.  - Comparison(s): No significant change from prior study.  - Conclusion(s)/Recommendation(s): Otherwise normal echocardiogram, with  minor abnormalities described in the report.      14 day Xio XT cardiac monitor 05/21/21: Patch Wear Time:  14 days and 0 hours Atrial Fibrillation occurred continuously (100% burden), ranging from 31-114 bpm (avg of 63 bpm). 1 Pause occurred lasting 3 secs (20 bpm) on 05/22/21 at 3:44 AM. Isolated VEs were rare (<1.0%). There was 1 triggered event, which was atrial fibrillation.     TEE 10/13/20: IMPRESSIONS   1. A 31 mm Watchman FLX is present in the LAA. Max diameter 27.9 mm (10%  compression). No device related thrombus or device leak. Left atrial size  was mildly dilated. No left atrial/left atrial appendage thrombus was  detected.   2. Aorta measurements comparable to recent CT. Aneurysm of the ascending  aorta, measuring 48 mm. There is Severe (Grade IV) protruding plaque  involving the descending aorta.   3. Left ventricular ejection fraction, by estimation, is 60 to 65%. The  left ventricle has normal function. The left ventricle has no regional  wall motion abnormalities.   4. Right ventricular systolic function is normal. The right ventricular  size is normal.   5. The mitral valve is grossly normal. Mild mitral valve regurgitation.  No evidence of mitral stenosis.   6. The aortic valve is tricuspid. Aortic valve regurgitation is not  visualized. No aortic stenosis is present.      EP Procedure 09/04/20: PROCEDURES:  1. Transseptal puncture 2. Transesophageal echocardiogram 3. Left atrial appendage occlusive device placement.     CT Cardiac (Pre-Watchman Device) 07/21/20: IMPRESSION: 1.  Moderate RAE, mild LAE with no LAA thrombus 2. Chicken wing morphology LAA suitable for a 27 mm Watchman FLX with 12.6% expected compression 3.  Normal PV anatomy with no anomaly 4.  Normal atrial septum with no PFO or lipomatous hypertrophy 5.   No pericardial effusion 6.  Optimum working angle to view LAA ostium RAO 31 Caudal 9 degrees 7. Suggest anterior curve sheath given anteriorly directed appendage along LV free wall 8. Moderate ascending aortic root dilatation 4.7 cm in orthogonal planes 9.  Suggest posterior mid fossa trans-septal puncture   Past Medical History:  Diagnosis Date   A-fib ([place])    Anemia    Arthritis    Bradycardia    Dupuytren's contracture of right hand    Hx of adenomatous colonic polyps    Hypertension    Pneumothorax 09/18/1998   spontaneous right PTX 09/18/98, s/p chest tube   SDH (subdural hematoma) ([place]) 10/27/2004   left chronic SDH versus hygroma 10/27/04 CT   Stroke [facility]) 2005   left sided weakness   Trigger finger     Past Surgical History:  Procedure Laterality Date   COLONOSCOPY  May 2010   Dr. fields: 5 cm terminal ileum normal, 6 polyps removed, moderate internal hemorrhoids, simple adenomas, next colonoscopy May 2015   COLONOSCOPY N/A 08/01/2015   Procedure: COLONOSCOPY;  Surgeon: Danie Binder, MD;  Location: [facility];  Service: Endoscopy;  Laterality: N/A;  0830   CRANIOTOMY  2000   pressure   INGUINAL HERNIA REPAIR Right    LEFT ATRIAL APPENDAGE OCCLUSION N/A 09/04/2020   Procedure: LEFT ATRIAL APPENDAGE OCCLUSION;  Surgeon: Vickie Epley, MD;  Location: [place] CV LAB;  Service: Cardiovascular;  Laterality: N/A;   OLECRANON BURSECTOMY Right 02/11/2015   Procedure: EXCISION RIGHT OLECRANON BURSA;  Surgeon: Sanjuana Kava, MD;  Location: [facility]S;  Service: Orthopedics;  Laterality: Right;   repair of trigger finger Left    pinky   TEE WITHOUT CARDIOVERSION N/A 09/04/2020   Procedure: TRANSESOPHAGEAL ECHOCARDIOGRAM (TEE);  Surgeon: Vickie Epley, MD;  Location: [place] CV LAB;  Service: Cardiovascular;  Laterality: N/A;   TEE WITHOUT CARDIOVERSION N/A 10/13/2020   Procedure: TRANSESOPHAGEAL ECHOCARDIOGRAM (TEE);  Surgeon: Geralynn Rile, MD;   Location: [place];  Service: Cardiovascular;  Laterality: N/A;   TOTAL HIP ARTHROPLASTY Right 10/19/2021   Procedure: RIGHT TOTAL HIP ARTHROPLASTY ANTERIOR APPROACH;  Surgeon: Leandrew Koyanagi, MD;  Location: [place];  Service: Orthopedics;  Laterality: Right;  3-C    MEDICATIONS:  acetaminophen (TYLENOL) 500 MG tablet   amLODipine (NORVASC) 10 MG tablet   aspirin EC 81 MG tablet   docusate sodium (COLACE) 100 MG capsule   Lidocaine-Menthol 4-1 % LIQD   methocarbamol (ROBAXIN) 500 MG tablet   tamsulosin (FLOMAX) 0.4 MG CAPS capsule   traMADol (ULTRAM) 50 MG tablet   traZODone (DESYREL) 100 MG tablet   vitamin E 180 MG (400 UNITS) capsule   No current facility-administered medications for this encounter.    Myra Gianotti, PA-C Surgical Short Stay/Anesthesiology [facility] Phone [phone] [facility] Phone [phone] 02/04/2022 2:17 PM

## 2022-02-04 NOTE — Anesthesia Preprocedure Evaluation (Addendum)
Anesthesia Evaluation  Patient identified by MRN, date of birth, ID band Patient awake    Reviewed: Allergy & Precautions, NPO status , Patient's Chart, lab work & pertinent test results  Airway Mallampati: III  TM Distance: >3 FB Neck ROM: Full    Dental  (+) Edentulous Upper, Dental Advisory Given   Pulmonary Current Smoker and Patient abstained from smoking.,  Current smoker, 13 pack year history  6-7 cigg/d    Pulmonary exam normal breath sounds clear to auscultation       Cardiovascular hypertension, Pt. on medications + Peripheral Vascular Disease  Normal cardiovascular exam+ dysrhythmias (s/p watchman 2021) Atrial Fibrillation  Rhythm:Regular Rate:Normal  Echo 05/2021 1. Left ventricular ejection fraction, by estimation, is 60 to 65%. The  left ventricle has normal function. The left ventricle has no regional  wall motion abnormalities. Left ventricular diastolic function could not  be evaluated.  2. Right ventricular systolic function is normal. The right ventricular  size is normal. There is normal pulmonary artery systolic pressure. The  estimated right ventricular systolic pressure is 37.8 mmHg.  3. Left atrial size was mildly dilated.  4. The mitral valve is normal in structure. Trivial mitral valve  regurgitation. No evidence of mitral stenosis.  5. The aortic valve is tricuspid. Aortic valve regurgitation is not  visualized. No aortic stenosis is present.  6. Aortic dilatation noted. There is mild dilatation of the aortic root,  measuring 40 mm.  7. The inferior vena cava is normal in size with greater than 50%  respiratory variability, suggesting right atrial pressure of 3 mmHg.   dilated ascending thoracic aorta dating back to at least 2018--max 4.8 cm on 08/11/17 (results discussed with patient then by ED provider with f/u advised) and 07/21/20 chest CT scan measured aortic root as 4.7 cm and ascending  aorta as 4.8 cm, although 06/02/21 TTE measured the aortic root as 3.8 cm and ascending aorta as 4.0 cm. Cardiology is following.   in afib with rate in the 50's, asymptomatic   Neuro/Psych CVA (L sided weakness), Residual Symptoms negative psych ROS   GI/Hepatic negative GI ROS, Neg liver ROS,   Endo/Other  negative endocrine ROS  Renal/GU negative Renal ROS  negative genitourinary   Musculoskeletal  (+) Arthritis , Osteoarthritis,    Abdominal   Peds negative pediatric ROS (+)  Hematology  (+) Blood dyscrasia, anemia , Hb 11.6, plt 310   Anesthesia Other Findings   Reproductive/Obstetrics negative OB ROS                           Anesthesia Physical Anesthesia Plan  ASA: 3  Anesthesia Plan: General   Post-op Pain Management: Tylenol PO (pre-op)*   Induction: Intravenous  PONV Risk Score and Plan: 1 and Ondansetron, Dexamethasone and Treatment may vary due to age or medical condition  Airway Management Planned: Oral ETT  Additional Equipment: None  Intra-op Plan:   Post-operative Plan: Extubation in OR  Informed Consent: I have reviewed the patients History and Physical, chart, labs and discussed the procedure including the risks, benefits and alternatives for the proposed anesthesia with the patient or authorized representative who has indicated his/her understanding and acceptance.     Dental advisory given  Plan Discussed with: CRNA  Anesthesia Plan Comments:        Anesthesia Quick Evaluation

## 2022-02-09 ENCOUNTER — Ambulatory Visit (HOSPITAL_BASED_OUTPATIENT_CLINIC_OR_DEPARTMENT_OTHER): Payer: Medicare HMO | Admitting: Anesthesiology

## 2022-02-09 ENCOUNTER — Other Ambulatory Visit: Payer: Self-pay

## 2022-02-09 ENCOUNTER — Encounter (HOSPITAL_COMMUNITY)
Admission: RE | Disposition: A | Discharge status: Home / Self Care | Payer: Self-pay | Source: Home / Self Care | Attending: General Surgery

## 2022-02-09 ENCOUNTER — Encounter (HOSPITAL_COMMUNITY): Payer: Self-pay | Admitting: General Surgery

## 2022-02-09 ENCOUNTER — Ambulatory Visit (HOSPITAL_COMMUNITY): Payer: Medicare HMO | Admitting: Vascular Surgery

## 2022-02-09 ENCOUNTER — Ambulatory Visit (HOSPITAL_COMMUNITY)
Admission: RE | Admit: 2022-02-09 | Discharge: 2022-02-09 | Disposition: A | Discharge status: Home / Self Care | Payer: Medicare HMO | Attending: General Surgery | Admitting: General Surgery

## 2022-02-09 DIAGNOSIS — I10 Essential (primary) hypertension: Secondary | ICD-10-CM

## 2022-02-09 DIAGNOSIS — K4090 Unilateral inguinal hernia, without obstruction or gangrene, not specified as recurrent: Secondary | ICD-10-CM | POA: Insufficient documentation

## 2022-02-09 DIAGNOSIS — D649 Anemia, unspecified: Secondary | ICD-10-CM

## 2022-02-09 DIAGNOSIS — M1990 Unspecified osteoarthritis, unspecified site: Secondary | ICD-10-CM | POA: Diagnosis not present

## 2022-02-09 DIAGNOSIS — I4891 Unspecified atrial fibrillation: Secondary | ICD-10-CM | POA: Diagnosis not present

## 2022-02-09 HISTORY — PX: INGUINAL HERNIA REPAIR: SHX194

## 2022-02-09 HISTORY — PX: INSERTION OF MESH: SHX5868

## 2022-02-09 SURGERY — REPAIR, HERNIA, INGUINAL, ADULT
Anesthesia: General | Site: Groin | Laterality: Left

## 2022-02-09 MED ORDER — CEFAZOLIN SODIUM-DEXTROSE 2-4 GM/100ML-% IV SOLN
2.0000 g | INTRAVENOUS | Status: AC
  Administered 2022-02-09: 2 g via INTRAVENOUS
  Filled 2022-02-09: qty 100

## 2022-02-09 MED ORDER — LIDOCAINE 2% (20 MG/ML) 5 ML SYRINGE
INTRAMUSCULAR | Status: AC
  Filled 2022-02-09: qty 5

## 2022-02-09 MED ORDER — ORAL CARE MOUTH RINSE
15.0000 mL | Freq: Once | OROMUCOSAL | Status: AC
Start: 2022-02-09 — End: 2022-02-09

## 2022-02-09 MED ORDER — ONDANSETRON HCL 4 MG/2ML IJ SOLN
INTRAMUSCULAR | Status: AC
  Filled 2022-02-09: qty 2

## 2022-02-09 MED ORDER — CHLORHEXIDINE GLUCONATE CLOTH 2 % EX PADS: 6.0000 | MEDICATED_PAD | Freq: Once | CUTANEOUS | Status: DC

## 2022-02-09 MED ORDER — EPHEDRINE 5 MG/ML INJ
INTRAVENOUS | Status: AC
  Filled 2022-02-09: qty 5

## 2022-02-09 MED ORDER — ONDANSETRON HCL 4 MG/2ML IJ SOLN
INTRAMUSCULAR | Status: DC | PRN
  Administered 2022-02-09: 4 mg via INTRAVENOUS

## 2022-02-09 MED ORDER — BUPIVACAINE-EPINEPHRINE 0.25% -1:200000 IJ SOLN
INTRAMUSCULAR | Status: DC | PRN
  Administered 2022-02-09: 30 mL

## 2022-02-09 MED ORDER — EPHEDRINE SULFATE (PRESSORS) 50 MG/ML IJ SOLN
INTRAMUSCULAR | Status: DC | PRN
  Administered 2022-02-09: 5 mg via INTRAVENOUS

## 2022-02-09 MED ORDER — BUPIVACAINE-EPINEPHRINE (PF) 0.25% -1:200000 IJ SOLN
INTRAMUSCULAR | Status: AC
  Filled 2022-02-09: qty 30

## 2022-02-09 MED ORDER — ROCURONIUM BROMIDE 10 MG/ML (PF) SYRINGE
PREFILLED_SYRINGE | INTRAVENOUS | Status: AC
  Filled 2022-02-09: qty 10

## 2022-02-09 MED ORDER — PROPOFOL 10 MG/ML IV BOLUS
INTRAVENOUS | Status: AC
  Filled 2022-02-09: qty 20

## 2022-02-09 MED ORDER — 0.9 % SODIUM CHLORIDE (POUR BTL) OPTIME
TOPICAL | Status: DC | PRN
  Administered 2022-02-09: 1000 mL

## 2022-02-09 MED ORDER — FENTANYL CITRATE (PF) 250 MCG/5ML IJ SOLN
INTRAMUSCULAR | Status: DC | PRN
  Administered 2022-02-09: 50 ug via INTRAVENOUS
  Administered 2022-02-09: 25 ug via INTRAVENOUS
  Administered 2022-02-09: 75 ug via INTRAVENOUS
  Administered 2022-02-09: 50 ug via INTRAVENOUS

## 2022-02-09 MED ORDER — LACTATED RINGERS IV SOLN
INTRAVENOUS | Status: DC
Start: 2022-02-09 — End: 2022-02-11

## 2022-02-09 MED ORDER — ROCURONIUM BROMIDE 10 MG/ML (PF) SYRINGE
PREFILLED_SYRINGE | INTRAVENOUS | Status: DC | PRN
  Administered 2022-02-09: 10 mg via INTRAVENOUS
  Administered 2022-02-09: 70 mg via INTRAVENOUS

## 2022-02-09 MED ORDER — DEXAMETHASONE SODIUM PHOSPHATE 10 MG/ML IJ SOLN
INTRAMUSCULAR | Status: AC
  Filled 2022-02-09: qty 1

## 2022-02-09 MED ORDER — LIDOCAINE 2% (20 MG/ML) 5 ML SYRINGE
INTRAMUSCULAR | Status: DC | PRN
  Administered 2022-02-09: 60 mg via INTRAVENOUS

## 2022-02-09 MED ORDER — DEXAMETHASONE SODIUM PHOSPHATE 10 MG/ML IJ SOLN
INTRAMUSCULAR | Status: DC | PRN
  Administered 2022-02-09: 8 mg via INTRAVENOUS

## 2022-02-09 MED ORDER — TRAMADOL HCL 50 MG PO TABS: 50.0000 mg | ORAL_TABLET | Freq: Four times a day (QID) | ORAL | 0 refills | Status: DC | PRN

## 2022-02-09 MED ORDER — PROPOFOL 10 MG/ML IV BOLUS
INTRAVENOUS | Status: DC | PRN
  Administered 2022-02-09: 100 mg via INTRAVENOUS

## 2022-02-09 MED ORDER — CHLORHEXIDINE GLUCONATE 0.12 % MT SOLN
15.0000 mL | Freq: Once | OROMUCOSAL | Status: AC
  Administered 2022-02-09: 15 mL via OROMUCOSAL
  Filled 2022-02-09: qty 15

## 2022-02-09 MED ORDER — ENSURE PRE-SURGERY PO LIQD: 296.0000 mL | Freq: Once | ORAL | Status: DC

## 2022-02-09 MED ORDER — ACETAMINOPHEN 500 MG PO TABS: 1000.0000 mg | ORAL_TABLET | Freq: Once | ORAL | Status: DC

## 2022-02-09 MED ORDER — ACETAMINOPHEN 500 MG PO TABS
1000.0000 mg | ORAL_TABLET | ORAL | Status: AC
  Administered 2022-02-09: 1000 mg via ORAL
  Filled 2022-02-09: qty 2

## 2022-02-09 MED ORDER — SUGAMMADEX SODIUM 200 MG/2ML IV SOLN
INTRAVENOUS | Status: DC | PRN
  Administered 2022-02-09: 200 mg via INTRAVENOUS

## 2022-02-09 MED ORDER — FENTANYL CITRATE (PF) 250 MCG/5ML IJ SOLN
INTRAMUSCULAR | Status: AC
  Filled 2022-02-09: qty 5

## 2022-02-09 SURGICAL SUPPLY — 50 items
ADH SKN CLS APL DERMABOND .7 (GAUZE/BANDAGES/DRESSINGS) ×1
APL PRP STRL LF DISP 70% ISPRP (MISCELLANEOUS) ×1
BAG COUNTER SPONGE SURGICOUNT (BAG) ×2 IMPLANT
BAG SPNG CNTER NS LX DISP (BAG) ×1
BLADE CLIPPER SURG (BLADE) IMPLANT
CANISTER SUCT 3000ML PPV (MISCELLANEOUS) IMPLANT
CHLORAPREP W/TINT 26 (MISCELLANEOUS) ×2 IMPLANT
COVER SURGICAL LIGHT HANDLE (MISCELLANEOUS) ×2 IMPLANT
DERMABOND ADVANCED (GAUZE/BANDAGES/DRESSINGS) ×1
DERMABOND ADVANCED .7 DNX12 (GAUZE/BANDAGES/DRESSINGS) ×1 IMPLANT
DRAIN PENROSE 1/2X12 LTX STRL (WOUND CARE) IMPLANT
DRAPE LAPAROSCOPIC ABDOMINAL (DRAPES) ×2 IMPLANT
ELECT REM PT RETURN 9FT ADLT (ELECTROSURGICAL) ×2
ELECTRODE REM PT RTRN 9FT ADLT (ELECTROSURGICAL) ×1 IMPLANT
GAUZE 4X4 16PLY ~~LOC~~+RFID DBL (SPONGE) ×2 IMPLANT
GLOVE BIO SURGEON STRL SZ7.5 (GLOVE) ×2 IMPLANT
GLOVE BIOGEL PI IND STRL 8 (GLOVE) ×1 IMPLANT
GLOVE BIOGEL PI INDICATOR 8 (GLOVE) ×1
GLOVE SURG SYN 7.5  E (GLOVE) ×2
GLOVE SURG SYN 7.5 E (GLOVE) ×1 IMPLANT
GLOVE SURG SYN 7.5 PF PI (GLOVE) ×1 IMPLANT
GOWN STRL REUS W/ TWL LRG LVL3 (GOWN DISPOSABLE) ×1 IMPLANT
GOWN STRL REUS W/ TWL XL LVL3 (GOWN DISPOSABLE) ×1 IMPLANT
GOWN STRL REUS W/TWL LRG LVL3 (GOWN DISPOSABLE) ×2
GOWN STRL REUS W/TWL XL LVL3 (GOWN DISPOSABLE) ×2
KIT BASIN OR (CUSTOM PROCEDURE TRAY) ×2 IMPLANT
KIT TURNOVER KIT B (KITS) ×2 IMPLANT
MESH PARIETEX PROGRIP LEFT (Mesh General) ×1 IMPLANT
NDL HYPO 25GX1X1/2 BEV (NEEDLE) ×1 IMPLANT
NEEDLE HYPO 25GX1X1/2 BEV (NEEDLE) ×2 IMPLANT
NS IRRIG 1000ML POUR BTL (IV SOLUTION) ×2 IMPLANT
PACK GENERAL/GYN (CUSTOM PROCEDURE TRAY) ×2 IMPLANT
PAD ARMBOARD 7.5X6 YLW CONV (MISCELLANEOUS) ×4 IMPLANT
PENCIL SMOKE EVACUATOR (MISCELLANEOUS) ×2 IMPLANT
SPONGE INTESTINAL PEANUT (DISPOSABLE) IMPLANT
SUT MNCRL AB 4-0 PS2 18 (SUTURE) ×2 IMPLANT
SUT PROLENE 2 0 SH DA (SUTURE) ×3 IMPLANT
SUT SILK 0 TIES 10X30 (SUTURE) ×2 IMPLANT
SUT VIC AB 2-0 SH 27 (SUTURE) ×4
SUT VIC AB 2-0 SH 27X BRD (SUTURE) ×1 IMPLANT
SUT VIC AB 2-0 SH 27XBRD (SUTURE) IMPLANT
SUT VIC AB 3-0 SH 27 (SUTURE) ×2
SUT VIC AB 3-0 SH 27XBRD (SUTURE) ×1 IMPLANT
SUT VICRYL AB 2 0 TIES (SUTURE) ×2 IMPLANT
SYR CONTROL 10ML LL (SYRINGE) ×2 IMPLANT
SYRINGE TOOMEY DISP (SYRINGE) ×2 IMPLANT
TOWEL GREEN STERILE (TOWEL DISPOSABLE) ×2 IMPLANT
TOWEL GREEN STERILE FF (TOWEL DISPOSABLE) ×2 IMPLANT
TRAY FOL W/BAG SLVR 16FR STRL (SET/KITS/TRAYS/PACK) ×1 IMPLANT
TRAY FOLEY W/BAG SLVR 16FR LF (SET/KITS/TRAYS/PACK) ×2

## 2022-02-09 NOTE — H&P (Signed)
Chief Complaint: Hernia       History of Present Illness: Larry Reese is a 76 y.o. male who is seen today as an office consultation at the request of Dr. Erlinda Hong for evaluation of Hernia .   Patient is a 77 year old male, with history of heart valve replacement, CAD, recent right hip surgery who comes in secondary to a left inguinal hernia.  He states that this area for several years.  He states is gotten fairly large.  He states that he has no pain or discomfort to the area.  Patient does state that he recently had a hip surgery and wanted to have that done prior to his hernia surgery.   He had no signs or symptoms of incarceration or strangulation. He was previously and recently cleared for hip surgery from his cardiologist Dr. Harrell Gave.     Review of Systems: A complete review of systems was obtained from the patient.  I have reviewed this information and discussed as appropriate with the patient.  See HPI as well for other ROS.   Review of Systems  Constitutional: Negative for fever.  HENT: Negative for congestion.   Eyes: Negative for blurred vision.  Respiratory: Negative for cough, shortness of breath and wheezing.   Cardiovascular: Negative for chest pain and palpitations.  Gastrointestinal: Negative for heartburn.  Genitourinary: Negative for dysuria.  Musculoskeletal: Negative for myalgias.  Skin: Negative for rash.  Neurological: Negative for dizziness and headaches.  Psychiatric/Behavioral: Negative for depression and suicidal ideas.  All other systems reviewed and are negative.       Medical History: Past Medical History Past Medical History: Diagnosis        Date            Arthritis                         Heart valve disease            There is no problem list on file for this patient.     Past Surgical History Past Surgical History: Procedure       Laterality         Date            heart vale                                  right hip  replacement                       Allergies Allergies Allergen           Reactions            Celecoxib        Anaphylaxis and Other (See Comments)                           Celebrex. Nose bleed Celebrex. Nose bleed.              Fluoxetine Hcl Swelling            Naproxen        Other (See Comments)                           Numbness in extremities Numbness in extremities         Current Outpatient Medications on  File Prior to Visit Medication       Sig       Dispense         Refill            docusate (COLACE) 100 MG capsule           Take by mouth                                      amLODIPine (NORVASC) 10 MG tablet        Take 10 mg by mouth once daily                                 HYDROcodone-acetaminophen (NORCO) 7.5-325 mg tablet                                                 methocarbamoL (ROBAXIN) 500 MG tablet                                         traZODone (DESYREL) 100 MG tablet                                        No current facility-administered medications on file prior to visit.     Family History History reviewed. No pertinent family history.     Social History   Tobacco Use Smoking Status           Every Day            Types: Cigarettes Smokeless Tobacco   Never     Social History Social History     Socioeconomic History            Marital status:  Married Tobacco Use            Smoking status:          Every Day                           Types: Cigarettes            Smokeless tobacco:    Never Substance and Sexual Activity            Alcohol use:    Never            Drug use:        Never       Objective:     Vitals:              01/06/22 1012 Pulse:  90 Temp:  36.8 C (98.3 F) SpO2:  99% Weight:            90.1 kg (198 lb 9.6 oz) Height: 199.4 cm (6' 6.5")   Body mass index is 22.66 kg/m. Physical Exam Constitutional:      Appearance: Normal appearance.  HENT:     Head: Normocephalic and atraumatic.     Nose:  Nose normal. No congestion.     Mouth/Throat:  Mouth: Mucous membranes are moist.     Pharynx: Oropharynx is clear.  Eyes:     Pupils: Pupils are equal, round, and reactive to light.  Cardiovascular:     Rate and Rhythm: Normal rate and regular rhythm.     Pulses: Normal pulses.     Heart sounds: Normal heart sounds. No murmur heard.   No friction rub. No gallop.  Pulmonary:     Effort: Pulmonary effort is normal. No respiratory distress.     Breath sounds: Normal breath sounds. No stridor. No wheezing, rhonchi or rales.  Abdominal:     General: Abdomen is flat.     Hernia: A hernia (large) is present. Hernia is present in the left inguinal area. There is no hernia in the right inguinal area.  Musculoskeletal:        General: Normal range of motion.     Cervical back: Normal range of motion.  Skin:    General: Skin is warm and dry.  Neurological:     General: No focal deficit present.     Mental Status: He is alert and oriented to person, place, and time.  Psychiatric:        Mood and Affect: Mood normal.        Thought Content: Thought content normal.          Assessment and Plan: Diagnoses and all orders for this visit:   Unilateral recurrent inguinal hernia without obstruction or gangrene     Larry Reese is a 76 y.o. male    1.          We will proceed to the OR for a open left inguinal hernia repair with mesh. 2.         All risks and benefits were discussed with the patient, to generally include infection, bleeding, damage to surrounding structures, acute and chronic nerve pain, and recurrence. Alternatives were offered and described.  All questions were answered and the patient voiced understanding of the procedure and wishes to proceed at this point.             No follow-ups on file.   Ralene Ok, MD, [facility], [facility]

## 2022-02-09 NOTE — Transfer of Care (Signed)
Immediate Anesthesia Transfer of Care Note  Patient: Larry Reese  Procedure(s) Performed: OPEN LEFT INGUINAL HERNIA REPAIR (Left: Groin) INSERTION OF MESH (Left: Groin)  Patient Location: PACU  Anesthesia Type:General  Level of Consciousness: awake, alert  and oriented  Airway & Oxygen Therapy: Patient Spontanous Breathing and Patient connected to face mask oxygen  Post-op Assessment: Report given to RN and Post -op Vital signs reviewed and stable  Post vital signs: Reviewed and stable  Last Vitals:  Vitals Value Taken Time  BP 128/63 02/09/22 0846  Temp 36.4 C 02/09/22 0830  Pulse 44 02/09/22 0854  Resp 15 02/09/22 0854  SpO2 95 % 02/09/22 0854  Vitals shown include unvalidated device data.  Last Pain:  Vitals:   02/09/22 0830  TempSrc:   PainSc: 0-No pain         Complications: No notable events documented.

## 2022-02-09 NOTE — Discharge Instructions (Addendum)
[facility] _______[facility] ? ?INGUINAL HERNIA REPAIR: POST OP INSTRUCTIONS ? ?Always review your discharge instruction sheet given to you by the facility where your surgery was performed. ?IF YOU HAVE DISABILITY OR FAMILY LEAVE FORMS, YOU MUST BRING THEM TO THE OFFICE FOR PROCESSING.   ?DO NOT GIVE THEM TO YOUR DOCTOR. ? ?1. A  prescription for pain medication may be given to you upon discharge.  Take your pain medication as prescribed, if needed.  If narcotic pain medicine is not needed, then you may take acetaminophen (Tylenol) or ibuprofen (Advil) as needed. ?2. Take your usually prescribed medications unless otherwise directed. ?If you need a refill on your pain medication, please contact your pharmacy.  They will contact our office to request authorization. Prescriptions will not be filled after 5 pm or on week-ends. ?3. You should follow a light diet the first 24 hours after arrival home, such as soup and crackers, etc.  Be sure to include lots of fluids daily.  Resume your normal diet the day after surgery. ?4.Most patients will experience some swelling and bruising around the umbilicus or in the groin and scrotum.  Ice packs and reclining will help.  Swelling and bruising can take several days to resolve.  ?6. It is common to experience some constipation if taking pain medication after surgery.  Increasing fluid intake and taking a stool softener (such as Colace) will usually help or prevent this problem from occurring.  A mild laxative (Milk of Magnesia or Miralax) should be taken according to package directions if there are no bowel movements after 48 hours. ?7. Unless discharge instructions indicate otherwise, you may remove your bandages 24-48 hours after surgery, and you may shower at that time.  You may have steri-strips (small skin tapes) in place directly over the incision.  These strips should be left on the skin for 7-10 days.  If your surgeon used skin glue on the incision, you may  shower in 24 hours.  The glue will flake off over the next 2-3 weeks.  Any sutures or staples will be removed at the office during your follow-up visit. ?8. ACTIVITIES:  You may resume regular (light) daily activities beginning the next day--such as daily self-care, walking, climbing stairs--gradually increasing activities as tolerated.  You may have sexual intercourse when it is comfortable.  Refrain from any heavy lifting or straining until approved by your doctor. ? ?a.You may drive when you are no longer taking prescription pain medication, you can comfortably wear a seatbelt, and you can safely maneuver your car and apply brakes. ?b.RETURN TO WORK:   ?_____________________________________________ ? ?9.You should see your doctor in the office for a follow-up appointment approximately 2-3 weeks after your surgery.  Make sure that you call for this appointment within a day or two after you arrive home to insure a convenient appointment time. ?10.OTHER INSTRUCTIONS: _________________________ ?   _____________________________________ ? ?WHEN TO CALL YOUR DOCTOR: ?Fever over 101.0 ?Inability to urinate ?Nausea and/or vomiting ?Extreme swelling or bruising ?Continued bleeding from incision. ?Increased pain, redness, or drainage from the incision ? ?The clinic staff is available to answer your questions during regular business hours.  Please don?t hesitate to call and ask to speak to one of the nurses for clinical concerns.  If you have a medical emergency, go to the nearest emergency room or call 911.  A surgeon from [facility] is always on call at the hospital ? ? ?[address], [address], [place], [place]    [address] ? ? [address] [address], [place], [place]   [address] ?[phone] ? [phone] ? FAX [phone] ?Web site: [email or web address] ? ?

## 2022-02-09 NOTE — Op Note (Signed)
Open LEFT inguinal hernia repair Operative Report  Pre Operative Diagnosis:  left inguinal hernia without incarceration or obstruction  Post Operative Diagnosis: same  Procedure: left open inguinal hernia repair with mesh  Surgeon: Larry Reese  Assistant: Larry Reese  Anesthesia: GET  EBL: minimal  Complications: none  Counts: reported as correct x 2  Findings:  Cord lipoma, excised. Direct hernia, reduced.  Indications for procedure:  Larry Reese is a 76 y.o. man presenting with longstanding symptomatic left inguinal hernia extending into the scrotum.  Details of the procedure: The patient was taken back to the operating room. The patient was placed in supine position with bilateral SCDs in place. The patient was prepped and draped in the usual sterile fashion.  After appropriate anitbiotics were confirmed, a time-out was confirmed and all facts were verified.  Quarter percent Marcaine was used to infiltrate the area of the incision and an ilioinguinal nerve block was also placed.   A 5 cm incision was made just 1 cm superior to the inguinal ligament. Bovie cautery was used to maintain hemostasis dissection is carried down to the external oblique.  A standard incision was made laterally, and the external oblique was bluntly dissected away from the surrounding tissue with Metzenbaum scissors. The external oblique was elevated in the spermatic cord was bluntly dissected away from the surrounding tissue.  The ilioinguinal nerve was identified and ligated with an 2-0 dyed vicryl.   The spermatic cord and the hernia were then bluntly dissected away from the pubic tubercle and a Penrose was placed around the hernia sac in the spermatic cord. The vas deferens was identified and protected at all portions of the case. Dissection of the cremasterics took place with Bovie cautery. A large cord lipoma was dissected off and divided at its base with cautery. The hernia sac was  dissected away from the cord structures and was found to be arising from the direct space. The inferior epigastrics were visualized at the base of the hernia. The hernia was then easily reduced.   At this time a Left-sided Progrip mesh was anchored to the pubic tubercle with a 2-0 Prolene.  It was anchored to the shelving edge of the external oblique x 1 and the conjoint tendon cephalad x 1.  The wrap around of the mesh was sutured as well.  The new internal ring did not strangulate the spermatic cord.   The tail was then tucked under the external oblique. At this time the area was irrigated out with sterile saline.    The external oblique was reapproximated using a 2-0 Vicryl in a running fashion. Local anesthetic was injected under the external oblique. Scarpa's fascia was then reapproximated using a 3-0 Vicryl running fashion. Local anesthetic was used to perform an ASIS block and infiltrated into the subcutaneous tissues. The skin was then reapproximated with 4 Monocryl in a subcuticular fashion. The skin was then dressed with Dermabond.  The patient was taken to the recovery room in stable condition.  Nicanor Alcon, MD  General Surgery Resident

## 2022-02-09 NOTE — Anesthesia Procedure Notes (Signed)
Procedure Name: Intubation Date/Time: 02/09/2022 7:32 AM Performed by: Inda Coke, CRNA Pre-anesthesia Checklist: Patient identified, Emergency Drugs available, Suction available and Patient being monitored Patient Re-evaluated:Patient Re-evaluated prior to induction Oxygen Delivery Method: Circle System Utilized Preoxygenation: Pre-oxygenation with 100% oxygen Induction Type: IV induction Ventilation: Mask ventilation without difficulty and Oral airway inserted - appropriate to patient size Laryngoscope Size: Mac and 4 Grade View: Grade I Tube type: Oral Tube size: 7.5 mm Number of attempts: 1 Airway Equipment and Method: Stylet and Oral airway Placement Confirmation: ETT inserted through vocal cords under direct vision, positive ETCO2 and breath sounds checked- equal and bilateral Secured at: 23 cm Tube secured with: Tape Dental Injury: Teeth and Oropharynx as per pre-operative assessment

## 2022-02-09 NOTE — Anesthesia Postprocedure Evaluation (Signed)
Anesthesia Post Note  Patient: Larry Reese  Procedure(s) Performed: OPEN LEFT INGUINAL HERNIA REPAIR (Left: Groin) INSERTION OF MESH (Left: Groin)     Patient location during evaluation: PACU Anesthesia Type: General Level of consciousness: awake and alert, oriented and patient cooperative Pain management: pain level controlled Vital Signs Assessment: post-procedure vital signs reviewed and stable Respiratory status: spontaneous breathing, nonlabored ventilation and respiratory function stable Cardiovascular status: blood pressure returned to baseline and stable Postop Assessment: no apparent nausea or vomiting Anesthetic complications: no   No notable events documented.  Last Vitals:  Vitals:   02/09/22 0845 02/09/22 0902  BP: 128/63 125/69  Pulse: (!) 55 (!) 55  Resp: 15 12  Temp:  36.5 C  SpO2: 98% 97%    Last Pain:  Vitals:   02/09/22 0830  TempSrc:   PainSc: 0-No pain                 Pervis Hocking

## 2022-02-10 ENCOUNTER — Encounter (HOSPITAL_COMMUNITY): Payer: Self-pay | Admitting: General Surgery

## 2022-02-14 DIAGNOSIS — M1990 Unspecified osteoarthritis, unspecified site: Secondary | ICD-10-CM | POA: Diagnosis not present

## 2022-02-14 DIAGNOSIS — I10 Essential (primary) hypertension: Secondary | ICD-10-CM | POA: Diagnosis not present

## 2022-02-18 ENCOUNTER — Other Ambulatory Visit: Payer: Self-pay | Admitting: Physician Assistant

## 2022-02-18 ENCOUNTER — Ambulatory Visit (HOSPITAL_COMMUNITY): Payer: Medicare HMO | Admitting: Physical Therapy

## 2022-02-19 ENCOUNTER — Other Ambulatory Visit: Payer: Self-pay | Admitting: Physician Assistant

## 2022-02-23 ENCOUNTER — Ambulatory Visit (HOSPITAL_COMMUNITY): Payer: Medicare HMO | Attending: Physician Assistant | Admitting: Physical Therapy

## 2022-02-23 ENCOUNTER — Encounter (HOSPITAL_COMMUNITY): Payer: Self-pay | Admitting: Physical Therapy

## 2022-02-23 DIAGNOSIS — M25651 Stiffness of right hip, not elsewhere classified: Secondary | ICD-10-CM | POA: Diagnosis not present

## 2022-02-23 DIAGNOSIS — R262 Difficulty in walking, not elsewhere classified: Secondary | ICD-10-CM | POA: Insufficient documentation

## 2022-02-23 DIAGNOSIS — M6281 Muscle weakness (generalized): Secondary | ICD-10-CM | POA: Diagnosis not present

## 2022-02-23 NOTE — Therapy (Signed)
OUTPATIENT PHYSICAL THERAPY LOWER EXTREMITY EVALUATION   Patient Name: Larry Reese MRN: [id number] DOB:Dec 04, 1945, 76 y.o., male Today's Date: 02/23/2022   PT End of Session - 02/23/22 0924     Visit Number 2    Number of Visits 12    Date for PT Re-Evaluation 03/26/22   Pt will be having a surgery and will be out for two weeks   Authorization Type [employer]    Authorization Time Period 12 visits approved 01/29/22 03/26/22    Authorization - Visit Number 2    Authorization - Number of Visits 12    Progress Note Due on Visit 10    PT Start Time 0925    PT Stop Time 1000    PT Time Calculation (min) 35 min    Activity Tolerance Patient tolerated treatment well    Behavior During Therapy [facility] for tasks assessed/performed             Past Medical History:  Diagnosis Date   A-fib ([place])    Anemia    Arthritis    Bradycardia    Dilatation of thoracic aorta ([facility])    dilated aortic root and ascending aorta   Dupuytren's contracture of right hand    Hx of adenomatous colonic polyps    Hypertension    Pneumothorax 09/18/1998   spontaneous right PTX 09/18/98, s/p chest tube   SDH (subdural hematoma) ([place]) 10/27/2004   left chronic SDH versus hygroma 10/27/04 CT   Stroke [facility]) 2005   left sided weakness   Trigger finger    Past Surgical History:  Procedure Laterality Date   COLONOSCOPY  May 2010   Dr. fields: 5 cm terminal ileum normal, 6 polyps removed, moderate internal hemorrhoids, simple adenomas, next colonoscopy May 2015   COLONOSCOPY N/A 08/01/2015   Procedure: COLONOSCOPY;  Surgeon: Danie Binder, MD;  [facility];  Service: Endoscopy;  Laterality: N/A;  0830   CRANIOTOMY  2000   pressure   INGUINAL HERNIA REPAIR Right    INGUINAL HERNIA REPAIR Left 02/09/2022   Procedure: OPEN LEFT INGUINAL HERNIA REPAIR;  Surgeon: Ralene Ok, MD;  Location: [place];  Service: General;  Laterality: Left;   INSERTION OF MESH Left 02/09/2022   Procedure: INSERTION OF  MESH;  Surgeon: Ralene Ok, MD;  Location: [place];  Service: General;  Laterality: Left;   LEFT ATRIAL APPENDAGE OCCLUSION N/A 09/04/2020   Procedure: LEFT ATRIAL APPENDAGE OCCLUSION;  Surgeon: Vickie Epley, MD;  Location: [place] CV LAB;  Service: Cardiovascular;  Laterality: N/A;   OLECRANON BURSECTOMY Right 02/11/2015   Procedure: EXCISION RIGHT OLECRANON BURSA;  Surgeon: Sanjuana Kava, MD;  Location: [facility];  Service: Orthopedics;  Laterality: Right;   repair of trigger finger Left    pinky   TEE WITHOUT CARDIOVERSION N/A 09/04/2020   Procedure: TRANSESOPHAGEAL ECHOCARDIOGRAM (TEE);  Surgeon: Vickie Epley, MD;  Location: [place] CV LAB;  Service: Cardiovascular;  Laterality: N/A;   TEE WITHOUT CARDIOVERSION N/A 10/13/2020   Procedure: TRANSESOPHAGEAL ECHOCARDIOGRAM (TEE);  Surgeon: Geralynn Rile, MD;  Location: [place];  Service: Cardiovascular;  Laterality: N/A;   TOTAL HIP ARTHROPLASTY Right 10/19/2021   Procedure: RIGHT TOTAL HIP ARTHROPLASTY ANTERIOR APPROACH;  Surgeon: Leandrew Koyanagi, MD;  Location: [place];  Service: Orthopedics;  Laterality: Right;  3-C   Patient Active Problem List   Diagnosis Date Noted   Status post total replacement of right hip 10/19/2021   Paroxysmal atrial fibrillation ([place]) 09/04/2020  Typical atrial flutter ([place]) 08/20/2020   Secondary hypercoagulable state ([place]) 08/20/2020   History of subdural hematoma 06/12/2020   PAD (peripheral artery disease) ([place]) 05/27/2020   Essential hypertension 05/27/2020   Atrial fibrillation ([place]) 05/27/2020   Chronic toe pain, right foot 02/20/2019   Hematoma of left hip 02/20/2019   Avascular necrosis of bone of right hip ([place]) 01/23/2019   Pain in left hip 01/23/2019   Rib pain on left side 01/23/2019   History of colonic polyps    Hepatitis, alcoholic 63/84/6659   ETOH abuse 02/10/2015   Internal hemorrhoids 07/17/2009   Colon adenomas 07/15/2009    PCP: Dr. Legrand Rams  REFERRING  PROVIDER: Aundra Dubin, PA-C   REFERRING DIAG: Rt anterior approach THR due to necrosis  THERAPY DIAG:  Difficulty in walking, not elsewhere classified  Muscle weakness (generalized)  Stiffness of right hip, not elsewhere classified  ONSET DATE: 10/19/2021  SUBJECTIVE:   SUBJECTIVE STATEMENT:    Had hernia repair 02/09/22 and hasn't been able to walk as much as he was.    PERTINENT HISTORY: A Fib, PAD, HTN  PAIN:  Are you having pain? 5/10. In low back /R hip   PRECAUTIONS: None  WEIGHT BEARING RESTRICTIONS No  FALLS:  Has patient fallen in last 6 months? Yes. Number of falls 2 Both prior to surgery  LIVING ENVIRONMENT: Lives with: lives with their spouse Lives in: House/apartment Stairs: Yes: Internal: 3 steps; grab bars  Has following equipment at home: Single point cane  OCCUPATION: retired   PLOF: [place] get back to his yard work, walking    OBJECTIVE:     PATIENT SURVEYS:  FOTO 35  COGNITION:  Overall cognitive status: Within functional limits for tasks assessed     SENSATION: Not tested    POSTURE:  Flat back , forward bent   LE ROM:  Active ROM Right 01/29/2022 Left 01/29/2022  Hip flexion 90 110   LE MMT:  MMT Right 01/29/2022 Left 01/29/2022  Hip flexion    Hip extension 2/5 3/5  Hip abduction 4/5 4+/5  Hip adduction    Hip internal rotation    Hip external rotation    Knee flexion 5/5 4/5  Knee extension 5/5 5/5  Ankle dorsiflexion 3+/5 3-/5   Ankle plantarflexion    Ankle inversion    Ankle eversion     (Blank rows = not tested)   FUNCTIONAL TESTS:  30 seconds chair stand test  Pt unable to come sit to stand without use of B UE.  With B UE 6 x  2 minute walk test: 262 with decreased stride length on LT, decreased arm swing on Left, decreased ankle Dorsi and plantar flexion B  Single leg stance: Rt 1", LT 2"     TODAY'S TREATMENT: 02/23/22 Bridge 2x 10 Clam 2x 15 RLE Knee to chest stretch  30" x 2 March with ab set 1x 15 bilateral  HR/TR 1x 10 each with 3 second holds Squat 2x 10  Hamstring curl 2x 10 bilateral with unilateral UE support Alternating march 2x 15 bilateral  STS 1x 10  from plinth Gait: 226 feet without AD, cueing for R ankle DF for foot clearance     01/29/22 Sit to stand with hands x10 Sitting ankle pumps x 10  Bridge x 10             Knee to chest stretch 30" x 2  PATIENT EDUCATION:  Education details: HEP Person educated: Patient  and Spouse Education method: Explanation and Demonstration Education comprehension: verbalized understanding and returned demonstration   HOME EXERCISE PROGRAM: Sit to stand with hands x10 Sitting ankle pumps x 10  Bridge x 10             Knee to chest stretch 30" x 2 Access Code: [email or web address] Date: 02/23/2022 - Squat with Counter Support  - 1 x daily - 7 x weekly - 3 sets - 10 reps - Standing Marching (Mirrored)  - 1 x daily - 7 x weekly - 3 sets - 10 reps   ASSESSMENT:   CLINICAL IMPRESSION: Continued with previously completed exercises as patient has been out several weeks after having a hernia repair. He requires intermittent cueing for mechanics and rep count. Patient with difficulty completing ab set despite cueing with march. Began standing LE strengthening which is tolerated well. Difficulty with STS without UE support from chair likely due to patient's height, transitioned to STS from plinth. Patient will continue to benefit from physical therapy in order to improve function and reduce impairment.   OBJECTIVE IMPAIRMENTS Abnormal gait, decreased activity tolerance, decreased balance, difficulty walking, decreased ROM, decreased strength, and pain.   ACTIVITY LIMITATIONS community activity and yard work.   PERSONAL FACTORS 1 comorbidity: PAD  are also affecting patient's functional outcome.    REHAB POTENTIAL: Good  CLINICAL DECISION MAKING: Stable/uncomplicated  EVALUATION COMPLEXITY:  Low   GOALS: Goals reviewed with patient? Yes  SHORT TERM GOALS: Target date: 02/26/2022 (pt will be out of PT for 2 weeks due to surgery on 5/24)           1.   Pt to be I ing HEP to decrease his pain to no greater than a 5/10  Baseline: Goal status: IN PROGRESS  2.  Pt Rt hip ROM to improve to allow pt to be able to don and doff his shoes and socks.  Baseline:  Goal status: IN PROGRESS  3.  Pt to be able to single leg stance for 8 seconds to reduce pt risk of falling  Baseline:  Goal status: IN PROGRESS  4.  PT to be able to walk for 15 minutes with his cane without difficulty Baseline:  Goal status: IN PROGRESS    LONG TERM GOALS: Target date: 03/26/2022  pt will be out of PT for 2 weeks due to surgery on 5/24)  PT to be I in an advanced HEP to decrease pain to no greater than a 2/10 Baseline:  Goal status: IN PROGRESS  2.  Pt to be able to come sit to stand without using UE assist 12x in 30 seconds to demonstrate improved core/LE strength.  Baseline:  Goal status: IN PROGRESS  3.  Pt mm strength increased one grade to be able to go up and down 8 steps in a reciprocal manner using one handrail for balance.  Baseline:  Goal status: IN PROGRESS  4.  PT to be able to single leg stance at least 12 seconds B to have the confidence to be walking without and assistive device.  Baseline:  Goal status: IN PROGRESS     PLAN: PT FREQUENCY: 2x/week  PT DURATION: 6 weeks  PT will be out for 2 weeks so treatment duration will be 8 weeks   PLANNED INTERVENTIONS: Therapeutic exercises, Therapeutic activity, Neuromuscular re-education, Balance training, Gait training, and Patient/Family education  PLAN FOR NEXT SESSION:  Continue Standing  exercises and possibly add SLS, rocker board, knee drive to improve Rt hip  flexion.  Update HEP   9:25 AM, 02/23/22 Mearl Latin PT, DPT Physical Therapist at [facility]

## 2022-02-25 ENCOUNTER — Ambulatory Visit (HOSPITAL_COMMUNITY): Payer: Medicare HMO | Admitting: Physical Therapy

## 2022-02-25 DIAGNOSIS — M25651 Stiffness of right hip, not elsewhere classified: Secondary | ICD-10-CM

## 2022-02-25 DIAGNOSIS — M6281 Muscle weakness (generalized): Secondary | ICD-10-CM

## 2022-02-25 DIAGNOSIS — R262 Difficulty in walking, not elsewhere classified: Secondary | ICD-10-CM

## 2022-02-25 NOTE — Therapy (Signed)
OUTPATIENT PHYSICAL THERAPY LOWER EXTREMITY EVALUATION   Patient Name: Larry Reese MRN: [id number] DOB:Jun 25, 1946, 76 y.o., male Today's Date: 02/25/2022   PT End of Session - 02/25/22 0919     Visit Number 3    Number of Visits 12    Date for PT Re-Evaluation 03/26/22   Pt will be having a surgery and will be out for two weeks   Authorization Type [employer]    Authorization Time Period 12 visits approved 01/29/22 03/26/22    Authorization - Visit Number 3    Authorization - Number of Visits 12    Progress Note Due on Visit 10    PT Start Time 0919    PT Stop Time 1000    PT Time Calculation (min) 41 min    Activity Tolerance Patient tolerated treatment well    Behavior During Therapy [facility] for tasks assessed/performed             Past Medical History:  Diagnosis Date   A-fib ([place])    Anemia    Arthritis    Bradycardia    Dilatation of thoracic aorta (HCC)    dilated aortic root and ascending aorta   Dupuytren's contracture of right hand    Hx of adenomatous colonic polyps    Hypertension    Pneumothorax 09/18/1998   spontaneous right PTX 09/18/98, s/p chest tube   SDH (subdural hematoma) ([place]) 10/27/2004   left chronic SDH versus hygroma 10/27/04 CT   Stroke [facility]) 2005   left sided weakness   Trigger finger    Past Surgical History:  Procedure Laterality Date   COLONOSCOPY  May 2010   Dr. fields: 5 cm terminal ileum normal, 6 polyps removed, moderate internal hemorrhoids, simple adenomas, next colonoscopy May 2015   COLONOSCOPY N/A 08/01/2015   Procedure: COLONOSCOPY;  Surgeon: Danie Binder, MD;  [facility];  Service: Endoscopy;  Laterality: N/A;  0830   CRANIOTOMY  2000   pressure   INGUINAL HERNIA REPAIR Right    INGUINAL HERNIA REPAIR Left 02/09/2022   Procedure: OPEN LEFT INGUINAL HERNIA REPAIR;  Surgeon: Ralene Ok, MD;  Location: [place];  Service: General;  Laterality: Left;   INSERTION OF MESH Left 02/09/2022   Procedure: INSERTION OF  MESH;  Surgeon: Ralene Ok, MD;  Location: [place];  Service: General;  Laterality: Left;   LEFT ATRIAL APPENDAGE OCCLUSION N/A 09/04/2020   Procedure: LEFT ATRIAL APPENDAGE OCCLUSION;  Surgeon: Vickie Epley, MD;  Location: [place] CV [facility];  Service: Cardiovascular;  Laterality: N/A;   OLECRANON BURSECTOMY Right 02/11/2015   Procedure: EXCISION RIGHT OLECRANON BURSA;  Surgeon: Sanjuana Kava, MD;  [facility]: [facility];  Service: Orthopedics;  Laterality: Right;   repair of trigger finger Left    pinky   TEE WITHOUT CARDIOVERSION N/A 09/04/2020   Procedure: TRANSESOPHAGEAL ECHOCARDIOGRAM (TEE);  Surgeon: Vickie Epley, MD;  Location: [place] CV LAB;  Service: Cardiovascular;  Laterality: N/A;   TEE WITHOUT CARDIOVERSION N/A 10/13/2020   Procedure: TRANSESOPHAGEAL ECHOCARDIOGRAM (TEE);  Surgeon: Geralynn Rile, MD;  Location: [place];  Service: Cardiovascular;  Laterality: N/A;   TOTAL HIP ARTHROPLASTY Right 10/19/2021   Procedure: RIGHT TOTAL HIP ARTHROPLASTY ANTERIOR APPROACH;  Surgeon: Leandrew Koyanagi, MD;  Location: [place];  Service: Orthopedics;  Laterality: Right;  3-C   Patient Active Problem List   Diagnosis Date Noted   Status post total replacement of right hip 10/19/2021   Paroxysmal atrial fibrillation ([place]) 09/04/2020  Typical atrial flutter ([place]) 08/20/2020   Secondary hypercoagulable state ([place]) 08/20/2020   History of subdural hematoma 06/12/2020   PAD (peripheral artery disease) ([place]) 05/27/2020   Essential hypertension 05/27/2020   Atrial fibrillation ([place]) 05/27/2020   Chronic toe pain, right foot 02/20/2019   Hematoma of left hip 02/20/2019   Avascular necrosis of bone of right hip ([place]) 01/23/2019   Pain in left hip 01/23/2019   Rib pain on left side 01/23/2019   History of colonic polyps    Hepatitis, alcoholic 41/93/7902   ETOH abuse 02/10/2015   Internal hemorrhoids 07/17/2009   Colon adenomas 07/15/2009    PCP: Dr. Regina Eck  PROVIDER: Aundra Dubin, PA-C   REFERRING DIAG: Rt anterior approach THR due to necrosis  THERAPY DIAG:  No diagnosis found.  ONSET DATE: 10/19/2021  SUBJECTIVE:   SUBJECTIVE STATEMENT:    Pt states that he was hurting after he left here last time.    PERTINENT HISTORY: A Fib, PAD, HTN  PAIN:  Are you having pain? 5/10. In low back /R hip    FALLS:  Has patient fallen in last 6 months? Yes. Number of falls 2 Both prior to surgery  LIVING ENVIRONMENT: Lives with: lives with their spouse Lives in: House/apartment Stairs: Yes: Internal: 3 steps; grab bars  Has following equipment at home: Single point cane   PATIENT GOALS get back to his yard work, walking    OBJECTIVE:     PATIENT SURVEYS:  FOTO 35   LE ROM:  Active ROM Right 01/29/2022 Left 01/29/2022  Hip flexion 90 110   LE MMT:  MMT Right 01/29/2022 Left 01/29/2022  Hip flexion    Hip extension 2/5 3/5  Hip abduction 4/5 4+/5  Hip adduction    Hip internal rotation    Hip external rotation    Knee flexion 5/5 4/5  Knee extension 5/5 5/5  Ankle dorsiflexion 3+/5 3-/5   Ankle plantarflexion    Ankle inversion    Ankle eversion     (Blank rows = not tested)   FUNCTIONAL TESTS:  30 seconds chair stand test  Pt unable to come sit to stand without use of B UE.  With B UE 6 x  2 minute walk test: 262 with decreased stride length on LT, decreased arm swing on Left, decreased ankle Dorsi and plantar flexion B  Single leg stance: Rt 1", LT 2"     TODAY'S TREATMENT:              02/25/22              Gait with no assistive device  x 226.               Rocker board x 2'              Heel raises x 10               Functional squat x 10               Forward step up x 10 B 4"               Single leg stance B x 3 each               Marching x 10               Side stepping with green t band  2 RT  Hip extension with green t-band x 10 B              Sit to stand x 10               Supine:  Bridge with green theraband x 20 reps  02/23/22 Bridge 2x 10 Clam 2x 15 RLE Knee to chest stretch 30" x 2 March with ab set 1x 15 bilateral  HR/TR 1x 10 each with 3 second holds Squat 2x 10  Hamstring curl 2x 10 bilateral with unilateral UE support Alternating march 2x 15 bilateral  STS 1x 10  from plinth Gait: 226 feet without AD, cueing for R ankle DF for foot clearance     01/29/22 Sit to stand with hands x10 Sitting ankle pumps x 10  Bridge x 10             Knee to chest stretch 30" x 2  PATIENT EDUCATION:  Education details: HEP Person educated: Patient and Spouse Education method: Customer service manager Education comprehension: verbalized understanding and returned demonstration   HOME EXERCISE PROGRAM: 6/8: added theraband for bridging; sidestep with theraband.  Sit to stand with hands x10 Sitting ankle pumps x 10  Bridge x 10             Knee to chest stretch 30" x 2 Access Code: [email or web address] Date: 02/23/2022 - Squat with Counter Support  - 1 x daily - 7 x weekly - 3 sets - 10 reps - Standing Marching (Mirrored)  - 1 x daily - 7 x weekly - 3 sets - 10 reps   ASSESSMENT:   CLINICAL IMPRESSION:  Therapist focused on weight bearing activity focusing on strengthening gluteal mm.  Advised pt to stop using the rollator, he can use A cane for long distances but able to go without assistive device .   OBJECTIVE IMPAIRMENTS Abnormal gait, decreased activity tolerance, decreased balance, difficulty walking, decreased ROM, decreased strength, and pain.   ACTIVITY LIMITATIONS community activity and yard work.   PERSONAL FACTORS 1 comorbidity: PAD  are also affecting patient's functional outcome.    REHAB POTENTIAL: Good  CLINICAL DECISION MAKING: Stable/uncomplicated  EVALUATION COMPLEXITY: Low   GOALS: Goals reviewed with patient? Yes  SHORT TERM GOALS: Target date: 02/26/2022 (pt will be out of PT for 2 weeks due to surgery on 5/24)           1.   Pt  to be I ing HEP to decrease his pain to no greater than a 5/10  Baseline: Goal status: IN PROGRESS  2.  Pt Rt hip ROM to improve to allow pt to be able to don and doff his shoes and socks.  Baseline:  Goal status: IN PROGRESS  3.  Pt to be able to single leg stance for 8 seconds to reduce pt risk of falling  Baseline:  Goal status: IN PROGRESS  4.  PT to be able to walk for 15 minutes with his cane without difficulty Baseline:  Goal status: IN PROGRESS    LONG TERM GOALS: Target date: 03/26/2022  pt will be out of PT for 2 weeks due to surgery on 5/24)  PT to be I in an advanced HEP to decrease pain to no greater than a 2/10 Baseline:  Goal status: IN PROGRESS  2.  Pt to be able to come sit to stand without using UE assist 12x in 30 seconds to demonstrate improved core/LE strength.  Baseline:  Goal status: IN PROGRESS  3.  Pt mm strength increased one grade to be able to go up and down 8 steps in a reciprocal manner using one handrail for balance.  Baseline:  Goal status: IN PROGRESS  4.  PT to be able to single leg stance at least 12 seconds B to have the confidence to be walking without and assistive device.  Baseline:  Goal status: IN PROGRESS     PLAN: PT FREQUENCY: 2x/week  PT DURATION: 6 weeks  PT will be out for 2 weeks so treatment duration will be 8 weeks   PLANNED INTERVENTIONS: Therapeutic exercises, Therapeutic activity, Neuromuscular re-education, Balance training, Gait training, and Patient/Family education  PLAN FOR NEXT SESSION:  Continue Standing  exercises add steps Rayetta Humphrey, PT CLT [phone]  Physical Therapist at [facility]

## 2022-03-02 ENCOUNTER — Ambulatory Visit (HOSPITAL_COMMUNITY): Payer: Medicare HMO | Attending: Physician Assistant | Admitting: Physical Therapy

## 2022-03-02 ENCOUNTER — Encounter (HOSPITAL_COMMUNITY): Payer: Self-pay | Admitting: Physical Therapy

## 2022-03-02 DIAGNOSIS — M6281 Muscle weakness (generalized): Secondary | ICD-10-CM | POA: Diagnosis not present

## 2022-03-02 DIAGNOSIS — R262 Difficulty in walking, not elsewhere classified: Secondary | ICD-10-CM | POA: Insufficient documentation

## 2022-03-02 DIAGNOSIS — M25651 Stiffness of right hip, not elsewhere classified: Secondary | ICD-10-CM | POA: Diagnosis not present

## 2022-03-02 NOTE — Therapy (Signed)
OUTPATIENT PHYSICAL THERAPY LOWER EXTREMITY EVALUATION   Patient Name: Larry Reese MRN: [id number] DOB:04/04/1946, 76 y.o., male Today's Date: 03/02/2022   PT End of Session - 03/02/22 1003     Visit Number 4    Number of Visits 12    Date for PT Re-Evaluation 03/26/22   Pt will be having a surgery and will be out for two weeks   Authorization Type [employer]    Authorization Time Period 12 visits approved 01/29/22 03/26/22    Authorization - Visit Number 4    Authorization - Number of Visits 12    Progress Note Due on Visit 10    PT Start Time 1003    PT Stop Time 1041    PT Time Calculation (min) 38 min    Activity Tolerance Patient tolerated treatment well    Behavior During Therapy WFL for tasks assessed/performed             Past Medical History:  Diagnosis Date   A-fib ([place])    Anemia    Arthritis    Bradycardia    Dilatation of thoracic aorta (HCC)    dilated aortic root and ascending aorta   Dupuytren's contracture of right hand    Hx of adenomatous colonic polyps    Hypertension    Pneumothorax 09/18/1998   spontaneous right PTX 09/18/98, s/p chest tube   SDH (subdural hematoma) ([place]) 10/27/2004   left chronic SDH versus hygroma 10/27/04 CT   Stroke [facility]) 2005   left sided weakness   Trigger finger    Past Surgical History:  Procedure Laterality Date   COLONOSCOPY  May 2010   Dr. fields: 5 cm terminal ileum normal, 6 polyps removed, moderate internal hemorrhoids, simple adenomas, next colonoscopy May 2015   COLONOSCOPY N/A 08/01/2015   Procedure: COLONOSCOPY;  Surgeon: Danie Binder, MD;  [facility];  Service: Endoscopy;  Laterality: N/A;  0830   CRANIOTOMY  2000   pressure   INGUINAL HERNIA REPAIR Right    INGUINAL HERNIA REPAIR Left 02/09/2022   Procedure: OPEN LEFT INGUINAL HERNIA REPAIR;  Surgeon: Ralene Ok, MD;  Location: [place];  Service: General;  Laterality: Left;   INSERTION OF MESH Left 02/09/2022   Procedure: INSERTION OF  MESH;  Surgeon: Ralene Ok, MD;  Location: [place];  Service: General;  Laterality: Left;   LEFT ATRIAL APPENDAGE OCCLUSION N/A 09/04/2020   Procedure: LEFT ATRIAL APPENDAGE OCCLUSION;  Surgeon: Vickie Epley, MD;  Location: [place] CV LAB;  Service: Cardiovascular;  Laterality: N/A;   OLECRANON BURSECTOMY Right 02/11/2015   Procedure: EXCISION RIGHT OLECRANON BURSA;  Surgeon: Sanjuana Kava, MD;  [facility]: [facility];  Service: Orthopedics;  Laterality: Right;   repair of trigger finger Left    pinky   TEE WITHOUT CARDIOVERSION N/A 09/04/2020   Procedure: TRANSESOPHAGEAL ECHOCARDIOGRAM (TEE);  Surgeon: Vickie Epley, MD;  Location: [place] CV LAB;  Service: Cardiovascular;  Laterality: N/A;   TEE WITHOUT CARDIOVERSION N/A 10/13/2020   Procedure: TRANSESOPHAGEAL ECHOCARDIOGRAM (TEE);  Surgeon: Geralynn Rile, MD;  Location: [place];  Service: Cardiovascular;  Laterality: N/A;   TOTAL HIP ARTHROPLASTY Right 10/19/2021   Procedure: RIGHT TOTAL HIP ARTHROPLASTY ANTERIOR APPROACH;  Surgeon: Leandrew Koyanagi, MD;  Location: [place];  Service: Orthopedics;  Laterality: Right;  3-C   Patient Active Problem List   Diagnosis Date Noted   Status post total replacement of right hip 10/19/2021   Paroxysmal atrial fibrillation ([place]) 09/04/2020  Typical atrial flutter ([place]) 08/20/2020   Secondary hypercoagulable state ([place]) 08/20/2020   History of subdural hematoma 06/12/2020   PAD (peripheral artery disease) ([place]) 05/27/2020   Essential hypertension 05/27/2020   Atrial fibrillation ([place]) 05/27/2020   Chronic toe pain, right foot 02/20/2019   Hematoma of left hip 02/20/2019   Avascular necrosis of bone of right hip ([place]) 01/23/2019   Pain in left hip 01/23/2019   Rib pain on left side 01/23/2019   History of colonic polyps    Hepatitis, alcoholic 44/81/8563   ETOH abuse 02/10/2015   Internal hemorrhoids 07/17/2009   Colon adenomas 07/15/2009    PCP: Dr. Legrand Rams  REFERRING  PROVIDER: Aundra Dubin, PA-C   REFERRING DIAG: Rt anterior approach THR due to necrosis  THERAPY DIAG:  Difficulty in walking, not elsewhere classified  Muscle weakness (generalized)  Stiffness of right hip, not elsewhere classified  ONSET DATE: 10/19/2021  SUBJECTIVE:   SUBJECTIVE STATEMENT:    hip is a little stiff. Hasn't been doing much of HEP.    PERTINENT HISTORY: A Fib, PAD, HTN  PAIN:  Are you having pain? 4/10. In low back /R hip    FALLS:  Has patient fallen in last 6 months? Yes. Number of falls 2 Both prior to surgery  LIVING ENVIRONMENT: Lives with: lives with their spouse Lives in: House/apartment Stairs: Yes: Internal: 3 steps; grab bars  Has following equipment at home: Single point cane   PATIENT GOALS get back to his yard work, walking    OBJECTIVE:     PATIENT SURVEYS:  FOTO 35   LE ROM:  Active ROM Right 01/29/2022 Left 01/29/2022  Hip flexion 90 110   LE MMT:  MMT Right 01/29/2022 Left 01/29/2022  Hip flexion    Hip extension 2/5 3/5  Hip abduction 4/5 4+/5  Hip adduction    Hip internal rotation    Hip external rotation    Knee flexion 5/5 4/5  Knee extension 5/5 5/5  Ankle dorsiflexion 3+/5 3-/5   Ankle plantarflexion    Ankle inversion    Ankle eversion     (Blank rows = not tested)   FUNCTIONAL TESTS:  30 seconds chair stand test  Pt unable to come sit to stand without use of B UE.  With B UE 6 x  2 minute walk test: 262 with decreased stride length on LT, decreased arm swing on Left, decreased ankle Dorsi and plantar flexion B  Single leg stance: Rt 1", LT 2"     TODAY'S TREATMENT:              03/02/22 Marching 2x 10 bilateral  Heel raise x 20 Step up 6 inch 2x 15 bilateral Lateral stp up 6 inch 2x 10 bilateral  STS 2x 10 chair with black foam Stairs 7 inch alternating pattern 5RT SLS: 2x 30 seconds bilaterally with max of 5-6 seconds bilaterally without UE support   02/25/22              Gait with no  assistive device  x 226.               Rocker board x 2'              Heel raises x 10               Functional squat x 10               Forward step up x 10 B 4"  Single leg stance B x 3 each               Marching x 10               Side stepping with green t band  2 RT              Hip extension with green t-band x 10 B              Sit to stand x 10              Supine:  Bridge with green theraband x 20 reps  02/23/22 Bridge 2x 10 Clam 2x 15 RLE Knee to chest stretch 30" x 2 March with ab set 1x 15 bilateral  HR/TR 1x 10 each with 3 second holds Squat 2x 10  Hamstring curl 2x 10 bilateral with unilateral UE support Alternating march 2x 15 bilateral  STS 1x 10  from plinth Gait: 226 feet without AD, cueing for R ankle DF for foot clearance     01/29/22 Sit to stand with hands x10 Sitting ankle pumps x 10  Bridge x 10             Knee to chest stretch 30" x 2  PATIENT EDUCATION:  Education details: HEP Person educated: Patient and Spouse Education method: Customer service manager Education comprehension: verbalized understanding and returned demonstration   HOME EXERCISE PROGRAM: 6/8: added theraband for bridging; sidestep with theraband.  Sit to stand with hands x10 Sitting ankle pumps x 10  Bridge x 10             Knee to chest stretch 30" x 2 Access Code: [email or web address] Date: 02/23/2022 - Squat with Counter Support  - 1 x daily - 7 x weekly - 3 sets - 10 reps - Standing Marching (Mirrored)  - 1 x daily - 7 x weekly - 3 sets - 10 reps   ASSESSMENT:   CLINICAL IMPRESSION:  Continued with LE strengthening which is tolerated well. Patient able to complete step ups on increased height with additional reps. Patient given intermittent cueing for posture with good carry over. Able to perform STS from chair with foam today demonstrating improving LE strength. Overall, progressing well. Will likely d/c in next several sessions. Patient will continue to benefit  from physical therapy in order to improve function and reduce impairment.   OBJECTIVE IMPAIRMENTS Abnormal gait, decreased activity tolerance, decreased balance, difficulty walking, decreased ROM, decreased strength, and pain.   ACTIVITY LIMITATIONS community activity and yard work.   PERSONAL FACTORS 1 comorbidity: PAD  are also affecting patient's functional outcome.    REHAB POTENTIAL: Good  CLINICAL DECISION MAKING: Stable/uncomplicated  EVALUATION COMPLEXITY: Low   GOALS: Goals reviewed with patient? Yes  SHORT TERM GOALS: Target date: 02/26/2022 (pt will be out of PT for 2 weeks due to surgery on 5/24)           1.   Pt to be I ing HEP to decrease his pain to no greater than a 5/10  Baseline: Goal status: IN PROGRESS  2.  Pt Rt hip ROM to improve to allow pt to be able to don and doff his shoes and socks.  Baseline: 6/13 able bilaterally  Goal status: MET  3.  Pt to be able to single leg stance for 8 seconds to reduce pt risk of falling  Baseline:  6/13 max 5-6 seconds bilaterally Goal status: IN PROGRESS  4.  PT  to be able to walk for 15 minutes with his cane without difficulty Baseline:  Goal status: IN PROGRESS    LONG TERM GOALS: Target date: 03/26/2022  pt will be out of PT for 2 weeks due to surgery on 5/24)  PT to be I in an advanced HEP to decrease pain to no greater than a 2/10 Baseline:  Goal status: IN PROGRESS  2.  Pt to be able to come sit to stand without using UE assist 12x in 30 seconds to demonstrate improved core/LE strength.  Baseline:  Goal status: IN PROGRESS  3.  Pt mm strength increased one grade to be able to go up and down 8 steps in a reciprocal manner using one handrail for balance.  Baseline:  Goal status: IN PROGRESS  4.  PT to be able to single leg stance at least 12 seconds B to have the confidence to be walking without and assistive device.  Baseline:  Goal status: IN PROGRESS     PLAN: PT FREQUENCY: 2x/week  PT DURATION:  6 weeks  PT will be out for 2 weeks so treatment duration will be 8 weeks   PLANNED INTERVENTIONS: Therapeutic exercises, Therapeutic activity, Neuromuscular re-education, Balance training, Gait training, and Patient/Family education  PLAN FOR NEXT SESSION:  functional strengthening, glute strength   10:04 AM, 03/02/22 Mearl Latin PT, DPT Physical Therapist at [facility]

## 2022-03-04 ENCOUNTER — Encounter: Payer: Self-pay | Admitting: Orthopaedic Surgery

## 2022-03-04 ENCOUNTER — Encounter (HOSPITAL_COMMUNITY): Payer: Self-pay

## 2022-03-04 ENCOUNTER — Ambulatory Visit: Payer: Medicare HMO | Admitting: Orthopaedic Surgery

## 2022-03-04 ENCOUNTER — Ambulatory Visit (HOSPITAL_COMMUNITY): Payer: Medicare HMO | Attending: Internal Medicine

## 2022-03-04 VITALS — BP 126/77 | HR 65 | Ht 78.5 in | Wt 189.0 lb

## 2022-03-04 DIAGNOSIS — M25512 Pain in left shoulder: Secondary | ICD-10-CM | POA: Diagnosis not present

## 2022-03-04 DIAGNOSIS — M25651 Stiffness of right hip, not elsewhere classified: Secondary | ICD-10-CM

## 2022-03-04 DIAGNOSIS — R262 Difficulty in walking, not elsewhere classified: Secondary | ICD-10-CM | POA: Diagnosis not present

## 2022-03-04 DIAGNOSIS — Z96641 Presence of right artificial hip joint: Secondary | ICD-10-CM | POA: Insufficient documentation

## 2022-03-04 DIAGNOSIS — G8929 Other chronic pain: Secondary | ICD-10-CM | POA: Diagnosis not present

## 2022-03-04 DIAGNOSIS — M6281 Muscle weakness (generalized): Secondary | ICD-10-CM | POA: Diagnosis not present

## 2022-03-04 DIAGNOSIS — F17210 Nicotine dependence, cigarettes, uncomplicated: Secondary | ICD-10-CM

## 2022-03-04 NOTE — Progress Notes (Signed)
My shoulder is tender  He still has pain in the left shoulder.  He has pain with overhead use.  He has no new trauma, no swelling, no numbness.  He has had heart stent, hernia repair and total hip in the last year or so.  He uses a cane.   Left shoulder with decreased forward flexion to 145, abduction 120, internal and external 30, extension 5.  NV intact.  Encounter Diagnoses  Name Primary?   Chronic left shoulder pain Yes   Cigarette nicotine dependence without complication    His pain medicine can be refilled next week.  It is too early today.  Return in three months.  Continue exercises.  Call if any problem.  Precautions discussed.  Electronically Signed Sanjuana Kava, MD 6/15/202310:00 AM

## 2022-03-04 NOTE — Therapy (Signed)
OUTPATIENT PHYSICAL THERAPY LOWER EXTREMITY TREATMENT   Patient Name: Larry Reese MRN: [id number] DOB:Jun 29, 1946, 76 y.o., male Today's Date: 03/04/2022   PT End of Session - 03/04/22 1135     Visit Number 5    Number of Visits 12    Date for PT Re-Evaluation 03/26/22    Authorization Type [employer]    Authorization Time Period 12 visits approved 01/29/22 03/26/22    Authorization - Visit Number 5    Authorization - Number of Visits 12    PT Start Time 1660    PT Stop Time 1130    PT Time Calculation (min) 42 min    Activity Tolerance Patient tolerated treatment well    Behavior During Therapy [facility] for tasks assessed/performed              Past Medical History:  Diagnosis Date   A-fib ([place])    Anemia    Arthritis    Bradycardia    Dilatation of thoracic aorta (HCC)    dilated aortic root and ascending aorta   Dupuytren's contracture of right hand    Hx of adenomatous colonic polyps    Hypertension    Pneumothorax 09/18/1998   spontaneous right PTX 09/18/98, s/p chest tube   SDH (subdural hematoma) ([place]) 10/27/2004   left chronic SDH versus hygroma 10/27/04 CT   Stroke [facility]) 2005   left sided weakness   Trigger finger    Past Surgical History:  Procedure Laterality Date   COLONOSCOPY  May 2010   Dr. fields: 5 cm terminal ileum normal, 6 polyps removed, moderate internal hemorrhoids, simple adenomas, next colonoscopy May 2015   COLONOSCOPY N/A 08/01/2015   Procedure: COLONOSCOPY;  Surgeon: Danie Binder, MD;  [facility];  Service: Endoscopy;  Laterality: N/A;  0830   CRANIOTOMY  2000   pressure   INGUINAL HERNIA REPAIR Right    INGUINAL HERNIA REPAIR Left 02/09/2022   Procedure: OPEN LEFT INGUINAL HERNIA REPAIR;  Surgeon: Ralene Ok, MD;  Location: [place];  Service: General;  Laterality: Left;   INSERTION OF MESH Left 02/09/2022   Procedure: INSERTION OF MESH;  Surgeon: Ralene Ok, MD;  Location: [place];  Service: General;  Laterality:  Left;   LEFT ATRIAL APPENDAGE OCCLUSION N/A 09/04/2020   Procedure: LEFT ATRIAL APPENDAGE OCCLUSION;  Surgeon: Vickie Epley, MD;  Location: [place] CV LAB;  Service: Cardiovascular;  Laterality: N/A;   OLECRANON BURSECTOMY Right 02/11/2015   Procedure: EXCISION RIGHT OLECRANON BURSA;  Surgeon: Sanjuana Kava, MD;  [facility]: [facility];  Service: Orthopedics;  Laterality: Right;   repair of trigger finger Left    pinky   TEE WITHOUT CARDIOVERSION N/A 09/04/2020   Procedure: TRANSESOPHAGEAL ECHOCARDIOGRAM (TEE);  Surgeon: Vickie Epley, MD;  Location: [place] CV LAB;  Service: Cardiovascular;  Laterality: N/A;   TEE WITHOUT CARDIOVERSION N/A 10/13/2020   Procedure: TRANSESOPHAGEAL ECHOCARDIOGRAM (TEE);  Surgeon: Geralynn Rile, MD;  Location: [place];  Service: Cardiovascular;  Laterality: N/A;   TOTAL HIP ARTHROPLASTY Right 10/19/2021   Procedure: RIGHT TOTAL HIP ARTHROPLASTY ANTERIOR APPROACH;  Surgeon: Leandrew Koyanagi, MD;  Location: [place];  Service: Orthopedics;  Laterality: Right;  3-C   Patient Active Problem List   Diagnosis Date Noted   Status post total replacement of right hip 10/19/2021   Paroxysmal atrial fibrillation ([place]) 09/04/2020   Typical atrial flutter ([place]) 08/20/2020   Secondary hypercoagulable state ([place]) 08/20/2020   History of subdural hematoma 06/12/2020  PAD (peripheral artery disease) ([place]) 05/27/2020   Essential hypertension 05/27/2020   Atrial fibrillation ([place]) 05/27/2020   Chronic toe pain, right foot 02/20/2019   Hematoma of left hip 02/20/2019   Avascular necrosis of bone of right hip ([place]) 01/23/2019   Pain in left hip 01/23/2019   Rib pain on left side 01/23/2019   History of colonic polyps    Hepatitis, alcoholic 16/55/3748   ETOH abuse 02/10/2015   Internal hemorrhoids 07/17/2009   Colon adenomas 07/15/2009    PCP: Dr. Legrand Rams  REFERRING PROVIDER: Aundra Dubin, PA-C   REFERRING DIAG: Rt anterior approach THR due to  necrosis  THERAPY DIAG:  Difficulty in walking, not elsewhere classified  Muscle weakness (generalized)  Stiffness of right hip, not elsewhere classified  ONSET DATE: 10/19/2021  SUBJECTIVE:   SUBJECTIVE STATEMENT:     Pt stated most difficulty with standing due to weakness and stiffness.  No reports of pain or recent fall.  Stated he stays active daily walking and moving his feet a lot, not necessarily completing HEP at home.     Eval subjective: Pt states that he had [place] for 2 weeks following his operation.  He states that prior to his hip bothering him he was not walking with a cane.  He wants to be able to put his socks on , bend over and pick items off the floor and complete yard work.   PERTINENT HISTORY: A Fib, PAD, HTN  PAIN:  Are you having pain? 4/10. In low back /R hip    FALLS:  Has patient fallen in last 6 months? Yes. Number of falls 2 Both prior to surgery  LIVING ENVIRONMENT: Lives with: lives with their spouse Lives in: House/apartment Stairs: Yes: Internal: 3 steps; grab bars  Has following equipment at home: Single point cane   PATIENT GOALS get back to his yard work, walking    OBJECTIVE:     PATIENT SURVEYS:  FOTO 35   LE ROM:  Active ROM Right 01/29/2022 Left 01/29/2022  Hip flexion 90 110   LE MMT:  MMT Right 01/29/2022 Left 01/29/2022  Hip flexion    Hip extension 2/5 3/5  Hip abduction 4/5 4+/5  Hip adduction    Hip internal rotation    Hip external rotation    Knee flexion 5/5 4/5  Knee extension 5/5 5/5  Ankle dorsiflexion 3+/5 3-/5   Ankle plantarflexion    Ankle inversion    Ankle eversion     (Blank rows = not tested)   FUNCTIONAL TESTS:  30 seconds chair stand test  Pt unable to come sit to stand without use of B UE.  With B UE 6 x  2 minute walk test: 262 with decreased stride length on LT, decreased arm swing on Left, decreased ankle Dorsi and plantar flexion B  Single leg stance: Rt 1", LT 2"     TODAY'S  TREATMENT:              03/04/22 Toe tapping 7in step height no HHA 2x 10 bilateral  Heel raise x 20 Stairs 7 inch alternating pattern 5RT Squat front of chair 15x STS 2x 10 chair with black foam (able to complete 4 reps without elevated height prior fatigue with decreased eccentric control) Bodycraft retro and sidestep 2Pl 5RT Leg press 2x 10 reps 3Pl  03/02/22 Marching 2x 10 bilateral  Heel raise x 20 Step up 6 inch 2x 15 bilateral Lateral stp up 6 inch 2x 10 bilateral  STS 2x 10 chair with black foam Stairs 7 inch alternating pattern 5RT SLS: 2x 30 seconds bilaterally with max of 5-6 seconds bilaterally without UE support   02/25/22              Gait with no assistive device  x 226.               Rocker board x 2'              Heel raises x 10               Functional squat x 10               Forward step up x 10 B 4"               Single leg stance B x 3 each               Marching x 10               Side stepping with green t band  2 RT              Hip extension with green t-band x 10 B              Sit to stand x 10              Supine:  Bridge with green theraband x 20 reps  02/23/22 Bridge 2x 10 Clam 2x 15 RLE Knee to chest stretch 30" x 2 March with ab set 1x 15 bilateral  HR/TR 1x 10 each with 3 second holds Squat 2x 10  Hamstring curl 2x 10 bilateral with unilateral UE support Alternating march 2x 15 bilateral  STS 1x 10  from plinth Gait: 226 feet without AD, cueing for R ankle DF for foot clearance     01/29/22 Sit to stand with hands x10 Sitting ankle pumps x 10  Bridge x 10             Knee to chest stretch 30" x 2  PATIENT EDUCATION:  Education details: HEP Person educated: Patient and Spouse Education method: Customer service manager Education comprehension: verbalized understanding and returned demonstration   HOME EXERCISE PROGRAM: 6/8: added theraband for bridging; sidestep with theraband.  Sit to stand with hands x10 Sitting ankle pumps  x 10  Bridge x 10             Knee to chest stretch 30" x 2 Access Code: [email or web address] Date: 02/23/2022 - Squat with Counter Support  - 1 x daily - 7 x weekly - 3 sets - 10 reps - Standing Marching (Mirrored)  - 1 x daily - 7 x weekly - 3 sets - 10 reps  03/04/22:  squat front of chair    ASSESSMENT:   CLINICAL IMPRESSION:  Session focus with proximal strengthening.  Added gluteal strengthening exercises to assist with main c/o of difficulty with STS.  Pt given squats as HEP and educated importance of exercise program compliance for maximal benefits.  Added Bodycraft retro and sidestepping for gluteal strengthening to improve stance phase with balance.  Also added leg press, difficulty with positioning due to leg length.  No reports of pain through session, was limited by fatigue.    OBJECTIVE IMPAIRMENTS Abnormal gait, decreased activity tolerance, decreased balance, difficulty walking, decreased ROM, decreased strength, and pain.   ACTIVITY LIMITATIONS community activity and yard work.   PERSONAL FACTORS 1 comorbidity: PAD  are  also affecting patient's functional outcome.    REHAB POTENTIAL: Good  CLINICAL DECISION MAKING: Stable/uncomplicated  EVALUATION COMPLEXITY: Low   GOALS: Goals reviewed with patient? Yes  SHORT TERM GOALS: Target date: 02/26/2022 (pt will be out of PT for 2 weeks due to surgery on 5/24)           1.   Pt to be I ing HEP to decrease his pain to no greater than a 5/10  Baseline: Goal status: IN PROGRESS  2.  Pt Rt hip ROM to improve to allow pt to be able to don and doff his shoes and socks.  Baseline: 6/13 able bilaterally  Goal status: MET  3.  Pt to be able to single leg stance for 8 seconds to reduce pt risk of falling  Baseline:  6/13 max 5-6 seconds bilaterally Goal status: IN PROGRESS  4.  PT to be able to walk for 15 minutes with his cane without difficulty Baseline:  Goal status: IN PROGRESS    LONG TERM GOALS: Target date: 03/26/2022  pt  will be out of PT for 2 weeks due to surgery on 5/24)  PT to be I in an advanced HEP to decrease pain to no greater than a 2/10 Baseline:  Goal status: IN PROGRESS  2.  Pt to be able to come sit to stand without using UE assist 12x in 30 seconds to demonstrate improved core/LE strength.  Baseline:  Goal status: IN PROGRESS  3.  Pt mm strength increased one grade to be able to go up and down 8 steps in a reciprocal manner using one handrail for balance.  Baseline:  Goal status: IN PROGRESS  4.  PT to be able to single leg stance at least 12 seconds B to have the confidence to be walking without and assistive device.  Baseline:  Goal status: IN PROGRESS     PLAN: PT FREQUENCY: 2x/week  PT DURATION: 6 weeks  PT will be out for 2 weeks so treatment duration will be 8 weeks   PLANNED INTERVENTIONS: Therapeutic exercises, Therapeutic activity, Neuromuscular re-education, Balance training, Gait training, and Patient/Family education  PLAN FOR NEXT SESSION:  functional strengthening, glute strength  Ihor Austin, LPTA/CLT; CBIS [phone]  3:09 PM, 03/04/22

## 2022-03-09 ENCOUNTER — Ambulatory Visit (HOSPITAL_COMMUNITY): Payer: Medicare HMO | Attending: Internal Medicine | Admitting: Physical Therapy

## 2022-03-09 ENCOUNTER — Telehealth: Payer: Self-pay | Admitting: Orthopaedic Surgery

## 2022-03-09 ENCOUNTER — Encounter (HOSPITAL_COMMUNITY): Payer: Self-pay | Admitting: Physical Therapy

## 2022-03-09 DIAGNOSIS — M25651 Stiffness of right hip, not elsewhere classified: Secondary | ICD-10-CM | POA: Diagnosis not present

## 2022-03-09 DIAGNOSIS — R262 Difficulty in walking, not elsewhere classified: Secondary | ICD-10-CM | POA: Insufficient documentation

## 2022-03-09 DIAGNOSIS — M6281 Muscle weakness (generalized): Secondary | ICD-10-CM | POA: Insufficient documentation

## 2022-03-09 MED ORDER — TRAMADOL HCL 50 MG PO TABS: 50.0000 mg | ORAL_TABLET | Freq: Two times a day (BID) | ORAL | 0 refills | Status: DC | PRN

## 2022-03-09 NOTE — Therapy (Signed)
OUTPATIENT PHYSICAL THERAPY LOWER EXTREMITY TREATMENT   Patient Name: Larry Reese MRN: [id number] DOB:[birth year], 76 y.o., male Today's Date: 03/09/2022   PT End of Session - 03/09/22 1001     Visit Number 6    Number of Visits 12    Date for PT Re-Evaluation 03/26/22    Authorization Type [employer]    Authorization Time Period 12 visits approved 01/29/22 03/26/22    Authorization - Visit Number 6    Authorization - Number of Visits 12    PT Start Time 1001    PT Stop Time 9983    PT Time Calculation (min) 39 min    Activity Tolerance Patient tolerated treatment well    Behavior During Therapy [facility] for tasks assessed/performed              Past Medical History:  Diagnosis Date   A-fib ([place])    Anemia    Arthritis    Bradycardia    Dilatation of thoracic aorta (HCC)    dilated aortic root and ascending aorta   Dupuytren's contracture of right hand    Hx of adenomatous colonic polyps    Hypertension    Pneumothorax 09/18/1998   spontaneous right PTX 09/18/98, s/p chest tube   SDH (subdural hematoma) ([place]) 10/27/2004   left chronic SDH versus hygroma 10/27/04 CT   Stroke [facility]) 2005   left sided weakness   Trigger finger    Past Surgical History:  Procedure Laterality Date   COLONOSCOPY  May 2010   Dr. fields: 5 cm terminal ileum normal, 6 polyps removed, moderate internal hemorrhoids, simple adenomas, next colonoscopy May 2015   COLONOSCOPY N/A 08/01/2015   Procedure: COLONOSCOPY;  Surgeon: Danie Binder, MD;  [facility];  Service: Endoscopy;  Laterality: N/A;  0830   CRANIOTOMY  2000   pressure   INGUINAL HERNIA REPAIR Right    INGUINAL HERNIA REPAIR Left 02/09/2022   Procedure: OPEN LEFT INGUINAL HERNIA REPAIR;  Surgeon: Ralene Ok, MD;  Location: [place];  Service: General;  Laterality: Left;   INSERTION OF MESH Left 02/09/2022   Procedure: INSERTION OF MESH;  Surgeon: Ralene Ok, MD;  Location: [place];  Service: General;  Laterality:  Left;   LEFT ATRIAL APPENDAGE OCCLUSION N/A 09/04/2020   Procedure: LEFT ATRIAL APPENDAGE OCCLUSION;  Surgeon: Vickie Epley, MD;  Location: [place] CV LAB;  Service: Cardiovascular;  Laterality: N/A;   OLECRANON BURSECTOMY Right 02/11/2015   Procedure: EXCISION RIGHT OLECRANON BURSA;  Surgeon: Sanjuana Kava, MD;  [facility];  Service: Orthopedics;  Laterality: Right;   repair of trigger finger Left    pinky   TEE WITHOUT CARDIOVERSION N/A 09/04/2020   Procedure: TRANSESOPHAGEAL ECHOCARDIOGRAM (TEE);  Surgeon: Vickie Epley, MD;  Location: [place] CV LAB;  Service: Cardiovascular;  Laterality: N/A;   TEE WITHOUT CARDIOVERSION N/A 10/13/2020   Procedure: TRANSESOPHAGEAL ECHOCARDIOGRAM (TEE);  Surgeon: Geralynn Rile, MD;  Location: [place];  Service: Cardiovascular;  Laterality: N/A;   TOTAL HIP ARTHROPLASTY Right 10/19/2021   Procedure: RIGHT TOTAL HIP ARTHROPLASTY ANTERIOR APPROACH;  Surgeon: Leandrew Koyanagi, MD;  Location: [place];  Service: Orthopedics;  Laterality: Right;  3-C   Patient Active Problem List   Diagnosis Date Noted   Status post total replacement of right hip 10/19/2021   Paroxysmal atrial fibrillation ([place]) 09/04/2020   Typical atrial flutter ([place]) 08/20/2020   Secondary hypercoagulable state ([place]) 08/20/2020   History of subdural hematoma 06/12/2020  PAD (peripheral artery disease) ([place]) 05/27/2020   Essential hypertension 05/27/2020   Atrial fibrillation ([place]) 05/27/2020   Chronic toe pain, right foot 02/20/2019   Hematoma of left hip 02/20/2019   Avascular necrosis of bone of right hip ([place]) 01/23/2019   Pain in left hip 01/23/2019   Rib pain on left side 01/23/2019   History of colonic polyps    Hepatitis, alcoholic 00/86/7619   ETOH abuse 02/10/2015   Internal hemorrhoids 07/17/2009   Colon adenomas 07/15/2009    PCP: Dr. Legrand Rams  REFERRING PROVIDER: Aundra Dubin, PA-C   REFERRING DIAG: Rt anterior approach THR due to  necrosis  THERAPY DIAG:  Difficulty in walking, not elsewhere classified  Muscle weakness (generalized)  Stiffness of right hip, not elsewhere classified  ONSET DATE: 10/19/2021  SUBJECTIVE:   SUBJECTIVE STATEMENT:     Patient states his hip is doing pretty good. Most trouble with getting up still and he has to stand there for a minute.   PERTINENT HISTORY: A Fib, PAD, HTN  PAIN:  Are you having pain? 4/10. In low back /R hip    FALLS:  Has patient fallen in last 6 months? Yes. Number of falls 2 Both prior to surgery  LIVING ENVIRONMENT: Lives with: lives with their spouse Lives in: House/apartment Stairs: Yes: Internal: 3 steps; grab bars  Has following equipment at home: Single point cane   PATIENT GOALS get back to his yard work, walking    OBJECTIVE:     PATIENT SURVEYS:  FOTO 35   LE ROM:  Active ROM Right 01/29/2022 Left 01/29/2022  Hip flexion 90 110   LE MMT:  MMT Right 01/29/2022 Left 01/29/2022  Hip flexion    Hip extension 2/5 3/5  Hip abduction 4/5 4+/5  Hip adduction    Hip internal rotation    Hip external rotation    Knee flexion 5/5 4/5  Knee extension 5/5 5/5  Ankle dorsiflexion 3+/5 3-/5   Ankle plantarflexion    Ankle inversion    Ankle eversion     (Blank rows = not tested)   FUNCTIONAL TESTS:  30 seconds chair stand test  Pt unable to come sit to stand without use of B UE.  With B UE 6 x  2 minute walk test: 262 with decreased stride length on LT, decreased arm swing on Left, decreased ankle Dorsi and plantar flexion B  Single leg stance: Rt 1", LT 2"     TODAY'S TREATMENT: 03/09/22 Alternating march 3# 2x 10 bilateral  Standing hip abduction 3# 2x 20 bilateral  Step up 8 inch 2x 15 RLE with intermittent HHA STS 1x 10 chair with black foam, 2x 10 from chair    03/04/22 Toe tapping 7in step height no HHA 2x 10 bilateral  Heel raise x 20 Stairs 7 inch alternating pattern 5RT Squat front of chair 15x STS 2x 10  chair with black foam (able to complete 4 reps without elevated height prior fatigue with decreased eccentric control) Bodycraft retro and sidestep 2Pl 5RT Leg press 2x 10 reps 3Pl  03/02/22 Marching 2x 10 bilateral  Heel raise x 20 Step up 6 inch 2x 15 bilateral Lateral stp up 6 inch 2x 10 bilateral  STS 2x 10 chair with black foam Stairs 7 inch alternating pattern 5RT SLS: 2x 30 seconds bilaterally with max of 5-6 seconds bilaterally without UE support    PATIENT EDUCATION:  Education details: HEP Person educated: Patient and Spouse Education method: Customer service manager  Education comprehension: verbalized understanding and returned demonstration   HOME EXERCISE PROGRAM: 6/8: added theraband for bridging; sidestep with theraband.  Sit to stand with hands x10 Sitting ankle pumps x 10  Bridge x 10             Knee to chest stretch 30" x 2 Access Code: [email or web address] Date: 02/23/2022 - Squat with Counter Support  - 1 x daily - 7 x weekly - 3 sets - 10 reps - Standing Marching (Mirrored)  - 1 x daily - 7 x weekly - 3 sets - 10 reps  03/04/22:  squat front of chair    ASSESSMENT:   CLINICAL IMPRESSION:  Patient able to complete standing exercises with increased resistance today and additional reps. Patient requires intermittent cueing for sequencing with fair carry over. Patient demonstrating improving functional strength and is able to complete STS without UE support from standard chair with intermittent dynamic knee valgus on RLE indicating impaired glute strength and motor control. Patient will continue to benefit from physical therapy in order to improve function and reduce impairment.   OBJECTIVE IMPAIRMENTS Abnormal gait, decreased activity tolerance, decreased balance, difficulty walking, decreased ROM, decreased strength, and pain.   ACTIVITY LIMITATIONS community activity and yard work.   PERSONAL FACTORS 1 comorbidity: PAD  are also affecting patient's functional  outcome.    REHAB POTENTIAL: Good  CLINICAL DECISION MAKING: Stable/uncomplicated  EVALUATION COMPLEXITY: Low   GOALS: Goals reviewed with patient? Yes  SHORT TERM GOALS: Target date: 02/26/2022 (pt will be out of PT for 2 weeks due to surgery on 5/24)           1.   Pt to be I ing HEP to decrease his pain to no greater than a 5/10  Baseline: Goal status: IN PROGRESS  2.  Pt Rt hip ROM to improve to allow pt to be able to don and doff his shoes and socks.  Baseline: 6/13 able bilaterally  Goal status: MET  3.  Pt to be able to single leg stance for 8 seconds to reduce pt risk of falling  Baseline:  6/13 max 5-6 seconds bilaterally Goal status: IN PROGRESS  4.  PT to be able to walk for 15 minutes with his cane without difficulty Baseline:  Goal status: IN PROGRESS    LONG TERM GOALS: Target date: 03/26/2022  pt will be out of PT for 2 weeks due to surgery on 5/24)  PT to be I in an advanced HEP to decrease pain to no greater than a 2/10 Baseline:  Goal status: IN PROGRESS  2.  Pt to be able to come sit to stand without using UE assist 12x in 30 seconds to demonstrate improved core/LE strength.  Baseline:  Goal status: IN PROGRESS  3.  Pt mm strength increased one grade to be able to go up and down 8 steps in a reciprocal manner using one handrail for balance.  Baseline:  Goal status: IN PROGRESS  4.  PT to be able to single leg stance at least 12 seconds B to have the confidence to be walking without and assistive device.  Baseline:  Goal status: IN PROGRESS     PLAN: PT FREQUENCY: 2x/week  PT DURATION: 6 weeks  PT will be out for 2 weeks so treatment duration will be 8 weeks   PLANNED INTERVENTIONS: Therapeutic exercises, Therapeutic activity, Neuromuscular re-education, Balance training, Gait training, and Patient/Family education  PLAN FOR NEXT SESSION:  functional strengthening, glute strength  10:02  AM, 03/09/22 Mearl Latin PT, DPT Physical  Therapist at [facility]

## 2022-03-09 NOTE — Telephone Encounter (Signed)
Patient requests refill of his same pain medication:  Larry Reese

## 2022-03-11 ENCOUNTER — Ambulatory Visit (HOSPITAL_COMMUNITY): Payer: Medicare HMO | Attending: Internal Medicine | Admitting: Physical Therapy

## 2022-03-11 DIAGNOSIS — Z96641 Presence of right artificial hip joint: Secondary | ICD-10-CM | POA: Diagnosis not present

## 2022-03-11 DIAGNOSIS — M6281 Muscle weakness (generalized): Secondary | ICD-10-CM | POA: Diagnosis not present

## 2022-03-11 DIAGNOSIS — M25651 Stiffness of right hip, not elsewhere classified: Secondary | ICD-10-CM

## 2022-03-11 DIAGNOSIS — R262 Difficulty in walking, not elsewhere classified: Secondary | ICD-10-CM | POA: Diagnosis not present

## 2022-03-11 DIAGNOSIS — M545 Low back pain, unspecified: Secondary | ICD-10-CM | POA: Diagnosis not present

## 2022-03-11 NOTE — Therapy (Signed)
OUTPATIENT PHYSICAL THERAPY LOWER EXTREMITY TREATMENT   Patient Name: Larry Reese MRN: [id number] DOB:1946-08-31, 76 y.o., male Today's Date: 03/11/2022   PT End of Session - 03/11/22 1041     Visit Number 7    Number of Visits 12    Date for PT Re-Evaluation 03/26/22    Authorization Type [employer]    Authorization Time Period 12 visits approved 01/29/22 03/26/22    Authorization - Visit Number 7    Authorization - Number of Visits 12    PT Start Time 7017    PT Stop Time 1125    PT Time Calculation (min) 40 min    Activity Tolerance Patient tolerated treatment well    Behavior During Therapy [facility] for tasks assessed/performed              Past Medical History:  Diagnosis Date   A-fib ([place])    Anemia    Arthritis    Bradycardia    Dilatation of thoracic aorta (HCC)    dilated aortic root and ascending aorta   Dupuytren's contracture of right hand    Hx of adenomatous colonic polyps    Hypertension    Pneumothorax 09/18/1998   spontaneous right PTX 09/18/98, s/p chest tube   SDH (subdural hematoma) ([place]) 10/27/2004   left chronic SDH versus hygroma 10/27/04 CT   Stroke [facility]) 2005   left sided weakness   Trigger finger    Past Surgical History:  Procedure Laterality Date   COLONOSCOPY  May 2010   Dr. fields: 5 cm terminal ileum normal, 6 polyps removed, moderate internal hemorrhoids, simple adenomas, next colonoscopy May 2015   COLONOSCOPY N/A 08/01/2015   Procedure: COLONOSCOPY;  Surgeon: Danie Binder, MD;  [facility];  Service: Endoscopy;  Laterality: N/A;  0830   CRANIOTOMY  2000   pressure   INGUINAL HERNIA REPAIR Right    INGUINAL HERNIA REPAIR Left 02/09/2022   Procedure: OPEN LEFT INGUINAL HERNIA REPAIR;  Surgeon: Ralene Ok, MD;  Location: [place];  Service: General;  Laterality: Left;   INSERTION OF MESH Left 02/09/2022   Procedure: INSERTION OF MESH;  Surgeon: Ralene Ok, MD;  Location: [place];  Service: General;  Laterality:  Left;   LEFT ATRIAL APPENDAGE OCCLUSION N/A 09/04/2020   Procedure: LEFT ATRIAL APPENDAGE OCCLUSION;  Surgeon: Vickie Epley, MD;  Location: [place] CV LAB;  Service: Cardiovascular;  Laterality: N/A;   OLECRANON BURSECTOMY Right 02/11/2015   Procedure: EXCISION RIGHT OLECRANON BURSA;  Surgeon: Sanjuana Kava, MD;  [facility]: [facility];  Service: Orthopedics;  Laterality: Right;   repair of trigger finger Left    pinky   TEE WITHOUT CARDIOVERSION N/A 09/04/2020   Procedure: TRANSESOPHAGEAL ECHOCARDIOGRAM (TEE);  Surgeon: Vickie Epley, MD;  Location: [place] CV LAB;  Service: Cardiovascular;  Laterality: N/A;   TEE WITHOUT CARDIOVERSION N/A 10/13/2020   Procedure: TRANSESOPHAGEAL ECHOCARDIOGRAM (TEE);  Surgeon: Geralynn Rile, MD;  Location: [place];  Service: Cardiovascular;  Laterality: N/A;   TOTAL HIP ARTHROPLASTY Right 10/19/2021   Procedure: RIGHT TOTAL HIP ARTHROPLASTY ANTERIOR APPROACH;  Surgeon: Leandrew Koyanagi, MD;  Location: [place];  Service: Orthopedics;  Laterality: Right;  3-C   Patient Active Problem List   Diagnosis Date Noted   Status post total replacement of right hip 10/19/2021   Paroxysmal atrial fibrillation ([place]) 09/04/2020   Typical atrial flutter ([place]) 08/20/2020   Secondary hypercoagulable state ([place]) 08/20/2020   History of subdural hematoma 06/12/2020  PAD (peripheral artery disease) (HCC) 05/27/2020   Essential hypertension 05/27/2020   Atrial fibrillation (HCC) 05/27/2020   Chronic toe pain, right foot 02/20/2019   Hematoma of left hip 02/20/2019   Avascular necrosis of bone of right hip (HCC) 01/23/2019   Pain in left hip 01/23/2019   Rib pain on left side 01/23/2019   History of colonic polyps    Hepatitis, alcoholic 02/10/2015   ETOH abuse 02/10/2015   Internal hemorrhoids 07/17/2009   Colon adenomas 07/15/2009    PCP: Dr. [name]  REFERRING PROVIDER: Stanbery, Mary L, PA-C   REFERRING DIAG: Rt anterior approach THR due to  necrosis  THERAPY DIAG:  Difficulty in walking, not elsewhere classified  Muscle weakness (generalized)  Stiffness of right hip, not elsewhere classified  ONSET DATE: 10/19/2021  SUBJECTIVE:   SUBJECTIVE STATEMENT:    Pt states that he is walking but not doing anything else.   PERTINENT HISTORY: A Fib, PAD, HTN  PAIN:  Are you having pain? Both hips are bothering him 0/10   pain is at a 5; states pain is all the time.     FALLS:  Has patient fallen in last 6 months? Yes. Number of falls 2 Both prior to surgery  LIVING ENVIRONMENT: Lives with: lives with their spouse Lives in: House/apartment Stairs: Yes: Internal: 3 steps; grab bars  Has following equipment at home: Single point cane   PATIENT GOALS get back to his yard work, walking    OBJECTIVE:     PATIENT SURVEYS:  FOTO 35   LE ROM:  Active ROM Right 01/29/2022 Left 01/29/2022  Hip flexion 90 110   LE MMT:  MMT Right 01/29/2022 Left 01/29/2022  Hip flexion    Hip extension 2/5 3/5  Hip abduction 4/5 4+/5  Hip adduction    Hip internal rotation    Hip external rotation    Knee flexion 5/5 4/5  Knee extension 5/5 5/5  Ankle dorsiflexion 3+/5 3-/5   Ankle plantarflexion    Ankle inversion    Ankle eversion     (Blank rows = not tested)   FUNCTIONAL TESTS:  30 seconds chair stand test  Pt unable to come sit to stand without use of B UE.  With B UE 6 x  2 minute walk test: 262 with decreased stride length on LT, decreased arm swing on Left, decreased ankle Dorsi and plantar flexion B  Single leg stance: Rt 1", LT 2"     TODAY'S TREATMENT:            03/11/22            Standing:            Rocker board x 2'            Heel raises x 15combined with squat using 3# in each hand                       Single leg stance x 5                       Tandem stance with head turns and nods.             Step up 8" x 15            Step down 8" x 15            Reciprocal steps x 3 RT               Gt  without assistive device x 226            Lunge onto bosu Rt and LT x 10            Reverse lunge x 10 RT            Sitting:            Sit to stand x 15       03/09/22 Alternating march 3# 2x 10 bilateral  Standing hip abduction 3# 2x 20 bilateral  Step up 8 inch 2x 15 RLE with intermittent HHA STS 1x 10 chair with black foam, 2x 10 from chair    03/04/22 Toe tapping 7in step height no HHA 2x 10 bilateral  Heel raise x 20 Stairs 7 inch alternating pattern 5RT Squat front of chair 15x STS 2x 10 chair with black foam (able to complete 4 reps without elevated height prior fatigue with decreased eccentric control) Bodycraft retro and sidestep 2Pl 5RT Leg press 2x 10 reps 3Pl  03/02/22 Marching 2x 10 bilateral  Heel raise x 20 Step up 6 inch 2x 15 bilateral Lateral stp up 6 inch 2x 10 bilateral  STS 2x 10 chair with black foam Stairs 7 inch alternating pattern 5RT SLS: 2x 30 seconds bilaterally with max of 5-6 seconds bilaterally without UE support    PATIENT EDUCATION:  Education details: HEP Person educated: Patient and Spouse Education method: Explanation and Demonstration Education comprehension: verbalized understanding and returned demonstration   HOME EXERCISE PROGRAM: 6/8: added theraband for bridging; sidestep with theraband.  Sit to stand with hands x10 Sitting ankle pumps x 10  Bridge x 10             Knee to chest stretch 30" x 2 Access Code: [email or web address] Date: 02/23/2022 - Squat with Counter Support  - 1 x daily - 7 x weekly - 3 sets - 10 reps - Standing Marching (Mirrored)  - 1 x daily - 7 x weekly - 3 sets - 10 reps  03/04/22:  squat front of chair    ASSESSMENT:   CLINICAL IMPRESSION:  Session concentrated on balance and improving pt confidence to be ambulating without assistive device.  Therapist added resistance with side stepping and added wt with heel raises and squats to simulate lifting something into a higher cabinet and lowering into an lower  cabinet.  OBJECTIVE IMPAIRMENTS Abnormal gait, decreased activity tolerance, decreased balance, difficulty walking, decreased ROM, decreased strength, and pain.   ACTIVITY LIMITATIONS community activity and yard work.   PERSONAL FACTORS 1 comorbidity: PAD  are also affecting patient's functional outcome.    REHAB POTENTIAL: Good  CLINICAL DECISION MAKING: Stable/uncomplicated  EVALUATION COMPLEXITY: Low   GOALS: Goals reviewed with patient? Yes  SHORT TERM GOALS: Target date: 02/26/2022 (pt will be out of PT for 2 weeks due to surgery on 5/24)           1.   Pt to be I ing HEP to decrease his pain to no greater than a 5/10  Baseline: Goal status: IN PROGRESS  2.  Pt Rt hip ROM to improve to allow pt to be able to don and doff his shoes and socks.  Baseline: 6/13 able bilaterally  Goal status: MET  3.  Pt to be able to single leg stance for 8 seconds to reduce pt risk of falling  Baseline:  6/13 max 5-6 seconds bilaterally Goal status: IN PROGRESS  4.  PT to be able to   walk for 15 minutes with his cane without difficulty Baseline:  Goal status: IN PROGRESS    LONG TERM GOALS: Target date: 03/26/2022  pt will be out of PT for 2 weeks due to surgery on 5/24)  PT to be I in an advanced HEP to decrease pain to no greater than a 2/10 Baseline:  Goal status: IN PROGRESS  2.  Pt to be able to come sit to stand without using UE assist 12x in 30 seconds to demonstrate improved core/LE strength.  Baseline:  Goal status: IN PROGRESS  3.  Pt mm strength increased one grade to be able to go up and down 8 steps in a reciprocal manner using one handrail for balance.  Baseline:  Goal status: IN PROGRESS  4.  PT to be able to single leg stance at least 12 seconds B to have the confidence to be walking without and assistive device.  Baseline:  Goal status: IN PROGRESS     PLAN: PT FREQUENCY: 2x/week  PT DURATION: 6 weeks  PT will be out for 2 weeks so treatment duration will be  8 weeks   PLANNED INTERVENTIONS: Therapeutic exercises, Therapeutic activity, Neuromuscular re-education, Balance training, Gait training, and Patient/Family education  PLAN FOR NEXT SESSION:  functional strengthening, glute strength  Cynthia Russell, PT CLT [phone]  

## 2022-03-16 ENCOUNTER — Ambulatory Visit (HOSPITAL_COMMUNITY): Payer: Medicare HMO

## 2022-03-16 ENCOUNTER — Encounter (HOSPITAL_COMMUNITY): Payer: Self-pay

## 2022-03-16 DIAGNOSIS — M25651 Stiffness of right hip, not elsewhere classified: Secondary | ICD-10-CM

## 2022-03-16 DIAGNOSIS — M6281 Muscle weakness (generalized): Secondary | ICD-10-CM | POA: Diagnosis not present

## 2022-03-16 DIAGNOSIS — R262 Difficulty in walking, not elsewhere classified: Secondary | ICD-10-CM | POA: Diagnosis not present

## 2022-03-17 DIAGNOSIS — I10 Essential (primary) hypertension: Secondary | ICD-10-CM | POA: Diagnosis not present

## 2022-03-17 DIAGNOSIS — E7800 Pure hypercholesterolemia, unspecified: Secondary | ICD-10-CM | POA: Diagnosis not present

## 2022-03-18 ENCOUNTER — Ambulatory Visit (HOSPITAL_COMMUNITY): Payer: Medicare HMO | Admitting: Physical Therapy

## 2022-03-18 DIAGNOSIS — M25651 Stiffness of right hip, not elsewhere classified: Secondary | ICD-10-CM

## 2022-03-18 DIAGNOSIS — M6281 Muscle weakness (generalized): Secondary | ICD-10-CM

## 2022-03-18 DIAGNOSIS — R262 Difficulty in walking, not elsewhere classified: Secondary | ICD-10-CM

## 2022-03-18 NOTE — Therapy (Signed)
OUTPATIENT PHYSICAL THERAPY LOWER EXTREMITY TREATMENT   Patient Name: Larry Reese MRN: [id number] DOB:1946/04/05, 76 y.o., male Today's Date: 03/18/2022   PT End of Session - 03/18/22 1017     Visit Number 9    Number of Visits 12    Date for PT Re-Evaluation 03/26/22    Authorization Type [employer]    Authorization Time Period 12 visits approved 01/29/22 03/26/22    Authorization - Visit Number 9    Authorization - Number of Visits 12    Progress Note Due on Visit 10    PT Start Time 1015   Pt was late   PT Stop Time 1053    PT Time Calculation (min) 45mn    Activity Tolerance Patient tolerated treatment well               Past Medical History:  Diagnosis Date   [place]fib [place]    Anemia    Arthritis    Bradycardia    Dilatation of thoracic aorta (HCC)    dilated aortic root and ascending aorta   Dupuytren's contracture of right hand    Hx of adenomatous colonic polyps    Hypertension    Pneumothorax 09/18/1998   spontaneous right PTX 09/18/98, s/p chest tube   [place]H ([place]) [place] 10/27/2004   left chronic SDH versus hygroma 10/27/04 CT   Stroke ([facility] 2005   left sided weakness   Trigger finger    Past Surgical History:  Procedure Laterality Date   COLONOSCOPY  May 2010   Dr. fields: 5 cm terminal ileum normal, 6 polyps removed, moderate internal hemorrhoids, simple adenomas, next colonoscopy May 2015   COLONOSCOPY N/A 08/01/2015   Procedure: COLONOSCOPY;  Surgeon: SDanie Binder MD;  [facility];  Service: Endoscopy;  Laterality: N/A;  0830   CRANIOTOMY  2000   pressure   INGUINAL HERNIA REPAIR Right    INGUINAL HERNIA REPAIR Left 02/09/2022   Procedure: OPEN LEFT INGUINAL HERNIA REPAIR;  Surgeon: RRalene Ok MD;  [place]  Service: General;  Laterality: Left;   INSERTION OF MESH Left 02/09/2022   Procedure: INSERTION OF MESH;  Surgeon: RRalene Ok MD;  [place]  Service: General;  Laterality: Left;   LEFT  ATRIAL APPENDAGE OCCLUSION N/A 09/04/2020   Procedure: LEFT ATRIAL APPENDAGE OCCLUSION;  Surgeon: LVickie Epley MD;  [facility];  Service: Cardiovascular;  Laterality: N/A;   OLECRANON BURSECTOMY Right 02/11/2015   Procedure: EXCISION RIGHT OLECRANON BURSA;  Surgeon: WSanjuana Kava MD;  [facility];  Service: Orthopedics;  Laterality: Right;   repair of trigger finger Left    pinky   TEE WITHOUT CARDIOVERSION N/A 09/04/2020   Procedure: TRANSESOPHAGEAL ECHOCARDIOGRAM (TEE);  Surgeon: LVickie Epley MD;  [place];  Service: Cardiovascular;  Laterality: N/A;   TEE WITHOUT CARDIOVERSION N/A 10/13/2020   Procedure: TRANSESOPHAGEAL ECHOCARDIOGRAM (TEE);  Surgeon: OGeralynn Rile MD;  [place]  Service: Cardiovascular;  Laterality: N/A;   TOTAL HIP ARTHROPLASTY Right 10/19/2021   Procedure: RIGHT TOTAL HIP ARTHROPLASTY ANTERIOR APPROACH;  Surgeon: XLeandrew Koyanagi MD;  [place]  Service: Orthopedics;  Laterality: Right;  3-C   Patient Active Problem List   Diagnosis Date Noted   Status post total replacement of right hip 10/19/2021   Paroxysmal atrial fibrillation ([place] 09/04/2020   Typical atrial flutter ([place] 08/20/2020   Secondary hypercoagulable state ([place] 08/20/2020   History of subdural hematoma  06/12/2020   PAD (peripheral artery disease) ([place]) 05/27/2020   Essential hypertension 05/27/2020   Atrial fibrillation ([place]) 05/27/2020   Chronic toe pain, right foot 02/20/2019   Hematoma of left hip 02/20/2019   Avascular necrosis of bone of right hip ([place]) 01/23/2019   Pain in left hip 01/23/2019   Rib pain on left side 01/23/2019   History of colonic polyps    Hepatitis, alcoholic 83/41/9622   ETOH abuse 02/10/2015   Internal hemorrhoids 07/17/2009   Colon adenomas 07/15/2009    PCP: Dr. Legrand Rams  REFERRING PROVIDER: Aundra Dubin, PA-C   REFERRING DIAG: Rt anterior approach THR due to  necrosis  THERAPY DIAG:  Difficulty in walking, not elsewhere classified  Muscle weakness (generalized)  Stiffness of right hip, not elsewhere classified  ONSET DATE: 10/19/2021  SUBJECTIVE:   SUBJECTIVE STATEMENT:    PT states when he first gets up he is stiff but once he starts moving he can get around a lot better .  He is walking a half mile a day. PERTINENT HISTORY: A Fib, PAD, HTN  PAIN:  Are you having pain? Both hips are bothering him 0/10   pain is at a 5; states pain is all the time.     FALLS:  Has patient fallen in last 6 months? Yes. Number of falls 2 Both prior to surgery  PATIENT GOALS get back to his yard work, walking    OBJECTIVE:     PATIENT SURVEYS:  FOTO 35   LE ROM:  Active ROM Right 01/29/2022 Left 01/29/2022  Hip flexion 90 110   LE MMT:  MMT Right 01/29/2022 Left 01/29/2022  Hip flexion    Hip extension 2/5 3/5  Hip abduction 4/5 4+/5  Hip adduction    Hip internal rotation    Hip external rotation    Knee flexion 5/5 4/5  Knee extension 5/5 5/5  Ankle dorsiflexion 3+/5 3-/5   Ankle plantarflexion    Ankle inversion    Ankle eversion     (Blank rows = not tested)   FUNCTIONAL TESTS:  30 seconds chair stand test  Pt unable to come sit to stand without use of B UE.  With B UE 6 x  2 minute walk test: 262 with decreased stride length on LT, decreased arm swing on Left, decreased ankle Dorsi and plantar flexion B  Single leg stance: Rt 1", LT 2"     TODAY'S TREATMENT: 03/18/22:           Standing:           6" forward step up x 12           6" step down x 12           Single leg stance B:  Rt:3"   , Lt:  4" max            Lunge onto bosu x 12            Squat x 12            Marching x 10            Gt no assistive device x 226'           Sit: sit to stand x 10           Supine:                      Knee to chest x 3  B                       Quad set x 10                       03/16/22:  Standing:  Gt without AD x  226, 1 LOB on corner able to self correct  Reciprocal stairs 7in step height 5RT  BOdycraft walkout retro and sidestep each direction 5RT 2Pl  Lunge onto bosu Rt and LT x 10 minimal HHA  Squat 15   Tandem stance- limited by time resume next session.  Leg press 2x 10 reps 3Pl  STS 15x eccentric control   03/11/22            Standing:            Rocker board x 2'            Heel raises x 15combined with squat using 3# in each hand                       Single leg stance x 5                       Tandem stance with head turns and nods.             Step up 8" x 15            Step down 8" x 15            Reciprocal steps x 3 RT             Gt without assistive device x 226            Lunge onto bosu Rt and LT x 10            Reverse lunge x 10 RT            Sitting:            Sit to stand x 15       03/09/22 Alternating march 3# 2x 10 bilateral  Standing hip abduction 3# 2x 20 bilateral  Step up 8 inch 2x 15 RLE with intermittent HHA STS 1x 10 chair with black foam, 2x 10 from chair    03/04/22 Toe tapping 7in step height no HHA 2x 10 bilateral  Heel raise x 20 Stairs 7 inch alternating pattern 5RT Squat front of chair 15x STS 2x 10 chair with black foam (able to complete 4 reps without elevated height prior fatigue with decreased eccentric control) Bodycraft retro and sidestep 2Pl 5RT Leg press 2x 10 reps 3Pl  03/02/22 Marching 2x 10 bilateral  Heel raise x 20 Step up 6 inch 2x 15 bilateral Lateral stp up 6 inch 2x 10 bilateral  STS 2x 10 chair with black foam Stairs 7 inch alternating pattern 5RT SLS: 2x 30 seconds bilaterally with max of 5-6 seconds bilaterally without UE support    PATIENT EDUCATION:  Education details: HEP Person educated: Patient and Spouse Education method: Customer service manager Education comprehension: verbalized understanding and returned demonstration   HOME EXERCISE PROGRAM: 6/8: added theraband for bridging; sidestep with  theraband.  Sit to stand with hands x10 Sitting ankle pumps x 10  Bridge x 10             Knee to chest stretch 30" x 2 Access Code: [email or web address] Date: 02/23/2022 - Squat  with Counter Support  - 1 x daily - 7 x weekly - 3 sets - 10 reps - Standing Marching (Mirrored)  - 1 x daily - 7 x weekly - 3 sets - 10 reps  03/04/22:  squat front of chair    ASSESSMENT:   CLINICAL IMPRESSION:  Pt continues to need verbal cuing for proper technique of exercises.   Pt continues to use a cane.  Added knee to chest secondary to pt complaint of B hip stiffness. Encouraged pt to ambulate without assistive device as much as possible to progress to weaning away from the cane.  OBJECTIVE IMPAIRMENTS Abnormal gait, decreased activity tolerance, decreased balance, difficulty walking, decreased ROM, decreased strength, and pain.   ACTIVITY LIMITATIONS community activity and yard work.   PERSONAL FACTORS 1 comorbidity: PAD  are also affecting patient's functional outcome.    REHAB POTENTIAL: Good  CLINICAL DECISION MAKING: Stable/uncomplicated  EVALUATION COMPLEXITY: Low   GOALS: Goals reviewed with patient? Yes  SHORT TERM GOALS: Target date: 02/26/2022 (pt will be out of PT for 2 weeks due to surgery on 5/24)           1.   Pt to be I ing HEP to decrease his pain to no greater than a 5/10  Baseline: Goal status: IN PROGRESS  2.  Pt Rt hip ROM to improve to allow pt to be able to don and doff his shoes and socks.  Baseline: 6/13 able bilaterally  Goal status: MET  3.  Pt to be able to single leg stance for 8 seconds to reduce pt risk of falling  Baseline:  6/13 max 5-6 seconds bilaterally Goal status: IN PROGRESS  4.  PT to be able to walk for 15 minutes with his cane without difficulty Baseline:  Goal status: IN PROGRESS    LONG TERM GOALS: Target date: 03/26/2022  pt will be out of PT for 2 weeks due to surgery on 5/24)  PT to be I in an advanced HEP to decrease pain to no greater than a  2/10 Baseline:  Goal status: IN PROGRESS  2.  Pt to be able to come sit to stand without using UE assist 12x in 30 seconds to demonstrate improved core/LE strength.  Baseline:  Goal status: IN PROGRESS  3.  Pt mm strength increased one grade to be able to go up and down 8 steps in a reciprocal manner using one handrail for balance.  Baseline:  Goal status: IN PROGRESS  4.  PT to be able to single leg stance at least 12 seconds B to have the confidence to be walking without and assistive device.  Baseline:  Goal status: IN PROGRESS     PLAN: PT FREQUENCY: 2x/week  PT DURATION: 6 weeks  PT will be out for 2 weeks so treatment duration will be 8 weeks   PLANNED INTERVENTIONS: Therapeutic exercises, Therapeutic activity, Neuromuscular re-education, Balance training, Gait training, and Patient/Family education  PLAN FOR NEXT SESSION:  reassess  Rayetta Humphrey, PT [place] 11:00AM [phone]

## 2022-03-19 DIAGNOSIS — M792 Neuralgia and neuritis, unspecified: Secondary | ICD-10-CM | POA: Diagnosis not present

## 2022-03-19 DIAGNOSIS — L84 Corns and callosities: Secondary | ICD-10-CM | POA: Diagnosis not present

## 2022-03-19 DIAGNOSIS — M2041 Other hammer toe(s) (acquired), right foot: Secondary | ICD-10-CM | POA: Diagnosis not present

## 2022-03-19 DIAGNOSIS — L603 Nail dystrophy: Secondary | ICD-10-CM | POA: Diagnosis not present

## 2022-03-19 DIAGNOSIS — I739 Peripheral vascular disease, unspecified: Secondary | ICD-10-CM | POA: Diagnosis not present

## 2022-03-19 DIAGNOSIS — B351 Tinea unguium: Secondary | ICD-10-CM | POA: Diagnosis not present

## 2022-03-31 ENCOUNTER — Encounter (HOSPITAL_COMMUNITY): Payer: Self-pay | Admitting: Physical Therapy

## 2022-03-31 NOTE — Therapy (Unsigned)
[place] [place], [place], [address] Phone: [phone]   Fax:  [phone]  Patient Details  Name: Larry Reese MRN: [id number] Date of Birth: 01-06-1946 Referring Provider:  Dwana Melena  Encounter Date: 03/31/2022 PHYSICAL THERAPY DISCHARGE SUMMARY  Visits from Start of Care: 9  Current functional level related to goals / functional outcomes: Pt walking 1/2 mile a day, walking with a cane   Remaining deficits: Unknown as pt did not return for follow up called and stated he wanted to work on the exercises at home.    Education / Equipment: HEP   Patient agrees to discharge. Patient goals were partially met. Patient is being discharged due to being pleased with the current functional level.  Rayetta Humphrey, PT CLT [phone]  03/31/2022, 3:34 PM  [place] [address] [place], [place], [address] Phone: [phone]   Fax:  [phone]

## 2022-04-06 ENCOUNTER — Encounter (HOSPITAL_COMMUNITY): Payer: Medicare HMO | Admitting: Physical Therapy

## 2022-04-07 ENCOUNTER — Encounter (HOSPITAL_COMMUNITY): Payer: Medicare HMO | Admitting: Physical Therapy

## 2022-04-14 ENCOUNTER — Ambulatory Visit (INDEPENDENT_AMBULATORY_CARE_PROVIDER_SITE_OTHER): Payer: Medicare HMO

## 2022-04-14 ENCOUNTER — Encounter: Payer: Self-pay | Admitting: Orthopaedic Surgery

## 2022-04-14 ENCOUNTER — Ambulatory Visit: Payer: Medicare HMO | Admitting: Orthopaedic Surgery

## 2022-04-14 DIAGNOSIS — Z96641 Presence of right artificial hip joint: Secondary | ICD-10-CM

## 2022-04-14 NOTE — Progress Notes (Signed)
Post-Op Visit Note   Patient: Larry Reese           Date of Birth: April 14, 1946           MRN: [id number] Visit Date: 04/14/2022 PCP: Carrolyn Meiers, MD   Assessment & Plan:  Chief Complaint:  Chief Complaint  Patient presents with   Right Hip - Follow-up    Right THA 10/19/21   Visit Diagnoses:  1. History of hip replacement, total, right     Plan: Mr. Muff is approximately 6 months status post right total hip on 10/19/2021.  Reports some start up stiffness but otherwise has no complaints.  Examination right hip shows a fully healed surgical scar.  Functional range of motion without pain.  Normal gait and ambulation.  X-rays demonstrate stable implant without any complications.  Mr. Plotts has done really well these last 6 months from the hip replacement.  Dental prophylaxis reinforced.  Activity as tolerated.  Recheck in 6 months with standing [facility] pelvis x-rays.  Follow-Up Instructions: Return in about 6 months (around 10/15/2022).   Orders:  Orders Placed This Encounter  Procedures   XR Pelvis 1-2 Views   No orders of the defined types were placed in this encounter.   Imaging: XR Pelvis 1-2 Views  Result Date: 04/14/2022 Stable total hip replacement without complications.  HO formation present.     PMFS History: Patient Active Problem List   Diagnosis Date Noted   Status post total replacement of right hip 10/19/2021   Paroxysmal atrial fibrillation ([place]) 09/04/2020   Typical atrial flutter ([place]) 08/20/2020   Secondary hypercoagulable state ([place]) 08/20/2020   History of subdural hematoma 06/12/2020   PAD (peripheral artery disease) ([place]) 05/27/2020   Essential hypertension 05/27/2020   Atrial fibrillation ([place]) 05/27/2020   Chronic toe pain, right foot 02/20/2019   Hematoma of left hip 02/20/2019   Avascular necrosis of bone of right hip ([place]) 01/23/2019   Pain in left hip 01/23/2019   Rib pain on left side 01/23/2019   History of  colonic polyps    Hepatitis, alcoholic 94/76/5465   ETOH abuse 02/10/2015   Internal hemorrhoids 07/17/2009   Colon adenomas 07/15/2009   Past Medical History:  Diagnosis Date   A-fib ([place])    Anemia    Arthritis    Bradycardia    Dilatation of thoracic aorta (H[facility])    dilated aortic root and ascending aorta   Dupuytren's contracture of right hand    Hx of adenomatous colonic polyps    Hypertension    Pneumothorax 09/18/1998   spontaneous right PTX 09/18/98, s/p chest tube   SDH (subdural hematoma) ([place]) 10/27/2004   left chronic SDH versus hygroma 10/27/04 CT   Stroke [facility]) 2005   left sided weakness   Trigger finger     Family History  Problem Relation Age of Onset   Esophageal cancer Father    Colon cancer Neg Hx     Past Surgical History:  Procedure Laterality Date   COLONOSCOPY  May 2010   Dr. fields: 5 cm terminal ileum normal, 6 polyps removed, moderate internal hemorrhoids, simple adenomas, next colonoscopy May 2015   COLONOSCOPY N/A 08/01/2015   Procedure: COLONOSCOPY;  Surgeon: Danie Binder, MD;  [facility];  Service: Endoscopy;  Laterality: N/A;  0830   CRANIOTOMY  2000   pressure   INGUINAL HERNIA REPAIR Right    INGUINAL HERNIA REPAIR Left 02/09/2022   Procedure: OPEN LEFT INGUINAL HERNIA REPAIR;  Surgeon: Ralene Ok, MD;  Location: [place];  Service: General;  Laterality: Left;   INSERTION OF MESH Left 02/09/2022   Procedure: INSERTION OF MESH;  Surgeon: Ralene Ok, MD;  Location: [place];  Service: General;  Laterality: Left;   LEFT ATRIAL APPENDAGE OCCLUSION N/A 09/04/2020   Procedure: LEFT ATRIAL APPENDAGE OCCLUSION;  Surgeon: Vickie Epley, MD;  Location: [place] [facility];  Service: Cardiovascular;  Laterality: N/A;   OLECRANON BURSECTOMY Right 02/11/2015   Procedure: EXCISION RIGHT OLECRANON BURSA;  Surgeon: Sanjuana Kava, MD;  Location: [facility];  Service: Orthopedics;  Laterality: Right;   repair of trigger finger Left     pinky   TEE WITHOUT CARDIOVERSION N/A 09/04/2020   Procedure: TRANSESOPHAGEAL ECHOCARDIOGRAM (TEE);  Surgeon: Vickie Epley, MD;  Location: [place] [facility];  Service: Cardiovascular;  Laterality: N/A;   TEE WITHOUT CARDIOVERSION N/A 10/13/2020   Procedure: TRANSESOPHAGEAL ECHOCARDIOGRAM (TEE);  Surgeon: Geralynn Rile, MD;  Location: [place];  Service: Cardiovascular;  Laterality: N/A;   TOTAL HIP ARTHROPLASTY Right 10/19/2021   Procedure: RIGHT TOTAL HIP ARTHROPLASTY ANTERIOR APPROACH;  Surgeon: Leandrew Koyanagi, MD;  Location: [place];  Service: Orthopedics;  Laterality: Right;  3-C   Social History   Occupational History   Occupation: retired  Tobacco Use   Smoking status: Every Day    Packs/day: 0.25    Years: 50.00    Total pack years: 12.50    Types: Cigarettes   Smokeless tobacco: Never   Tobacco comments:    5-6 cigarettes per day  Vaping Use   Vaping Use: Never used  Substance and Sexual Activity   Alcohol use: Not Currently   Drug use: No   Sexual activity: Yes    Birth control/protection: None

## 2022-04-16 DIAGNOSIS — I10 Essential (primary) hypertension: Secondary | ICD-10-CM | POA: Diagnosis not present

## 2022-04-16 DIAGNOSIS — M1990 Unspecified osteoarthritis, unspecified site: Secondary | ICD-10-CM | POA: Diagnosis not present

## 2022-04-23 ENCOUNTER — Telehealth: Payer: Self-pay | Admitting: Orthopaedic Surgery

## 2022-04-23 NOTE — Telephone Encounter (Signed)
Patient called for refill - last medication: traMADol (ULTRAM) 50 MG tablet 30 tablet       [place]*  *patient is asking if he may go back to previous medicine: Hydrocodone   - please advise if patient needs to be seen prior to his next scheduled visit in September

## 2022-04-27 MED ORDER — TRAMADOL HCL 50 MG PO TABS: 50.0000 mg | ORAL_TABLET | Freq: Two times a day (BID) | ORAL | 2 refills | Status: DC | PRN

## 2022-05-17 DIAGNOSIS — I10 Essential (primary) hypertension: Secondary | ICD-10-CM | POA: Diagnosis not present

## 2022-05-17 DIAGNOSIS — M545 Low back pain, unspecified: Secondary | ICD-10-CM | POA: Diagnosis not present

## 2022-05-18 ENCOUNTER — Telehealth: Payer: Self-pay | Admitting: Orthopaedic Surgery

## 2022-05-18 MED ORDER — TRAMADOL HCL 50 MG PO TABS: 50.0000 mg | ORAL_TABLET | Freq: Two times a day (BID) | ORAL | 2 refills | Status: DC | PRN

## 2022-05-18 NOTE — Telephone Encounter (Signed)
Patient called for refill:  traMADol (ULTRAM) 50 MG tablet 30 tablet       [place]

## 2022-05-18 NOTE — Telephone Encounter (Signed)
Patient called back and left voicemail at 11:54 he needs refill on his pain medicine

## 2022-05-24 IMAGING — CR DG CHEST 2V
2 series · 2 of 2 positions shown · non-contrast
Comparison: Chest x-ray dated 10/01/2013.

CLINICAL DATA: Preop exam for total hip replacement.

EXAM:
CHEST - 2 VIEW

[w chest pa]
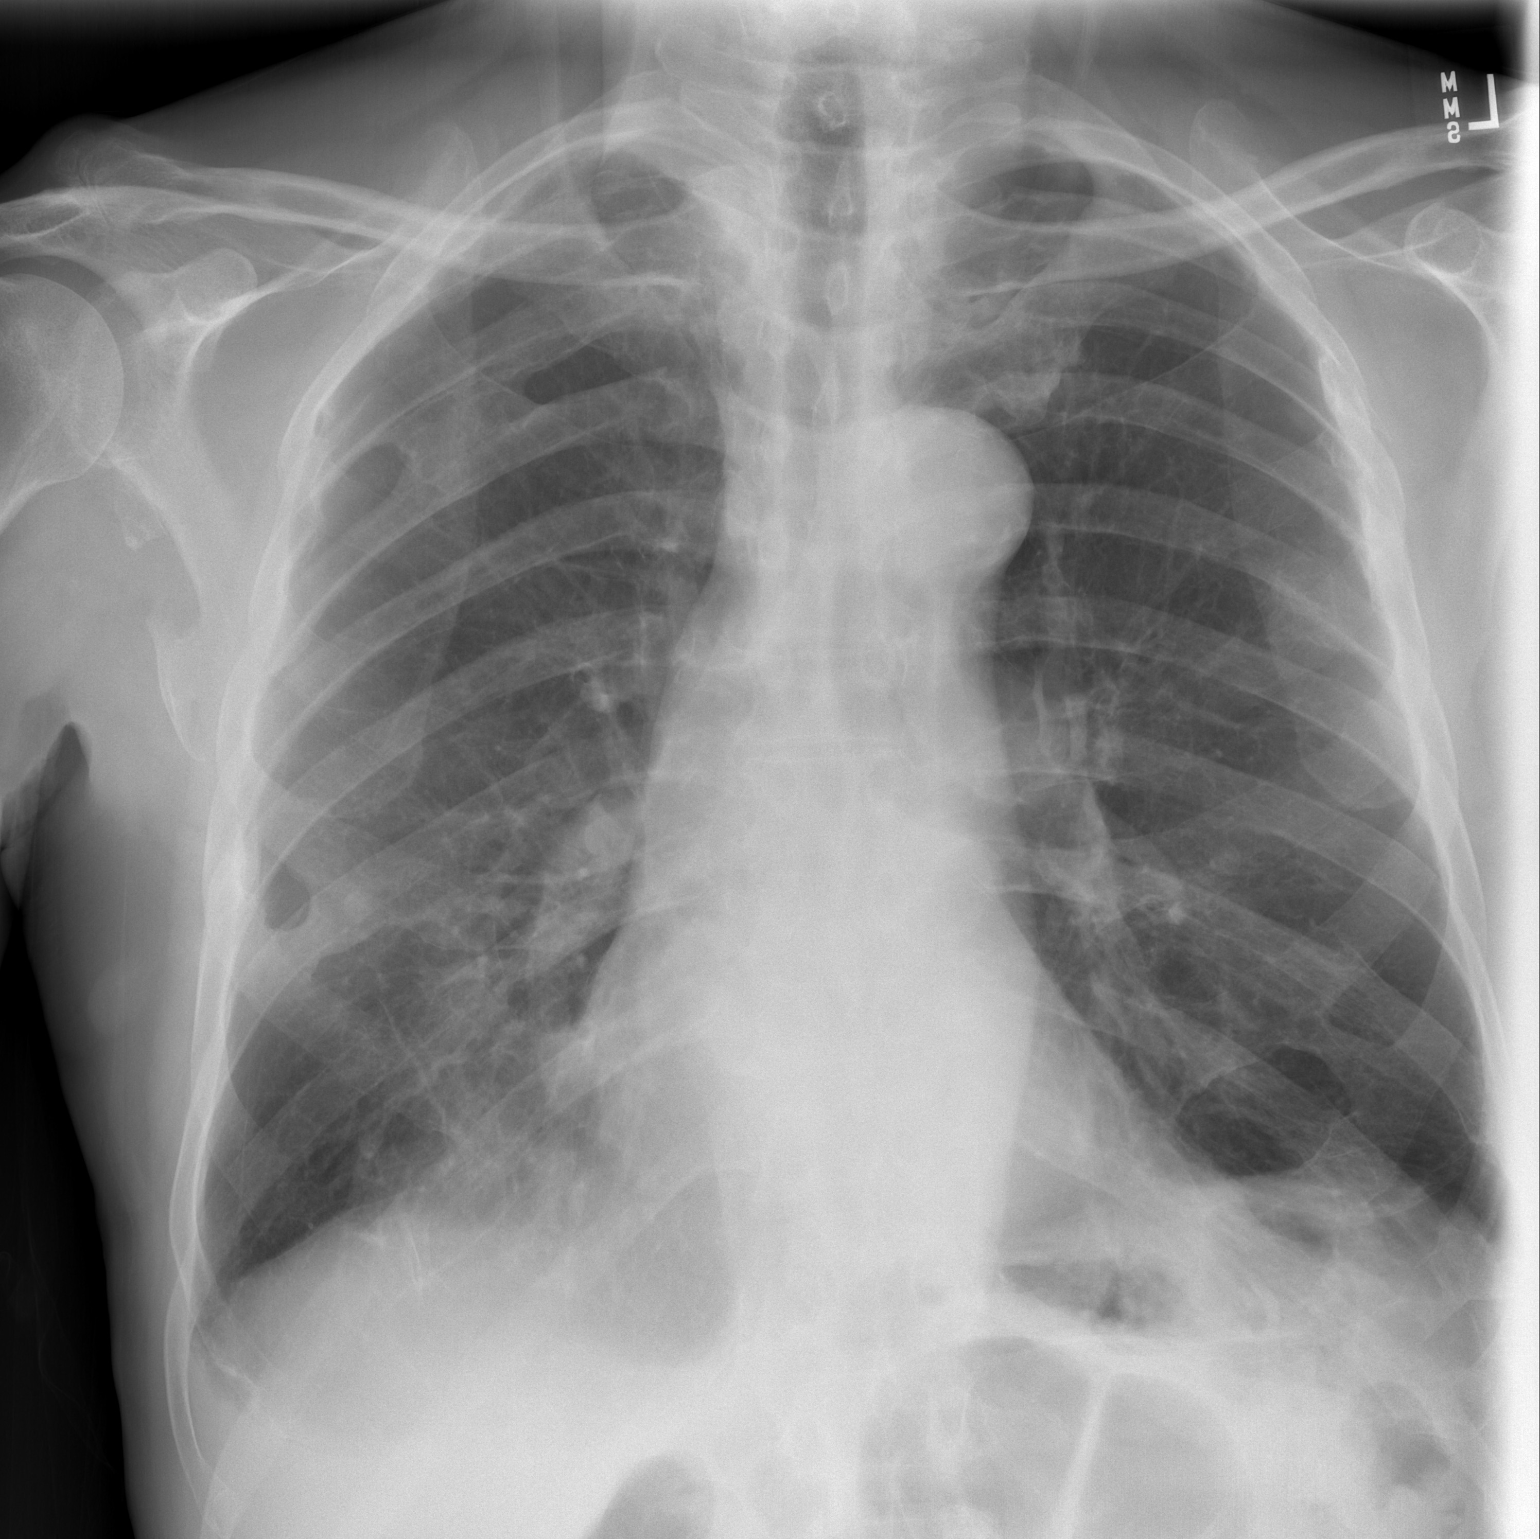

[w chest lat]
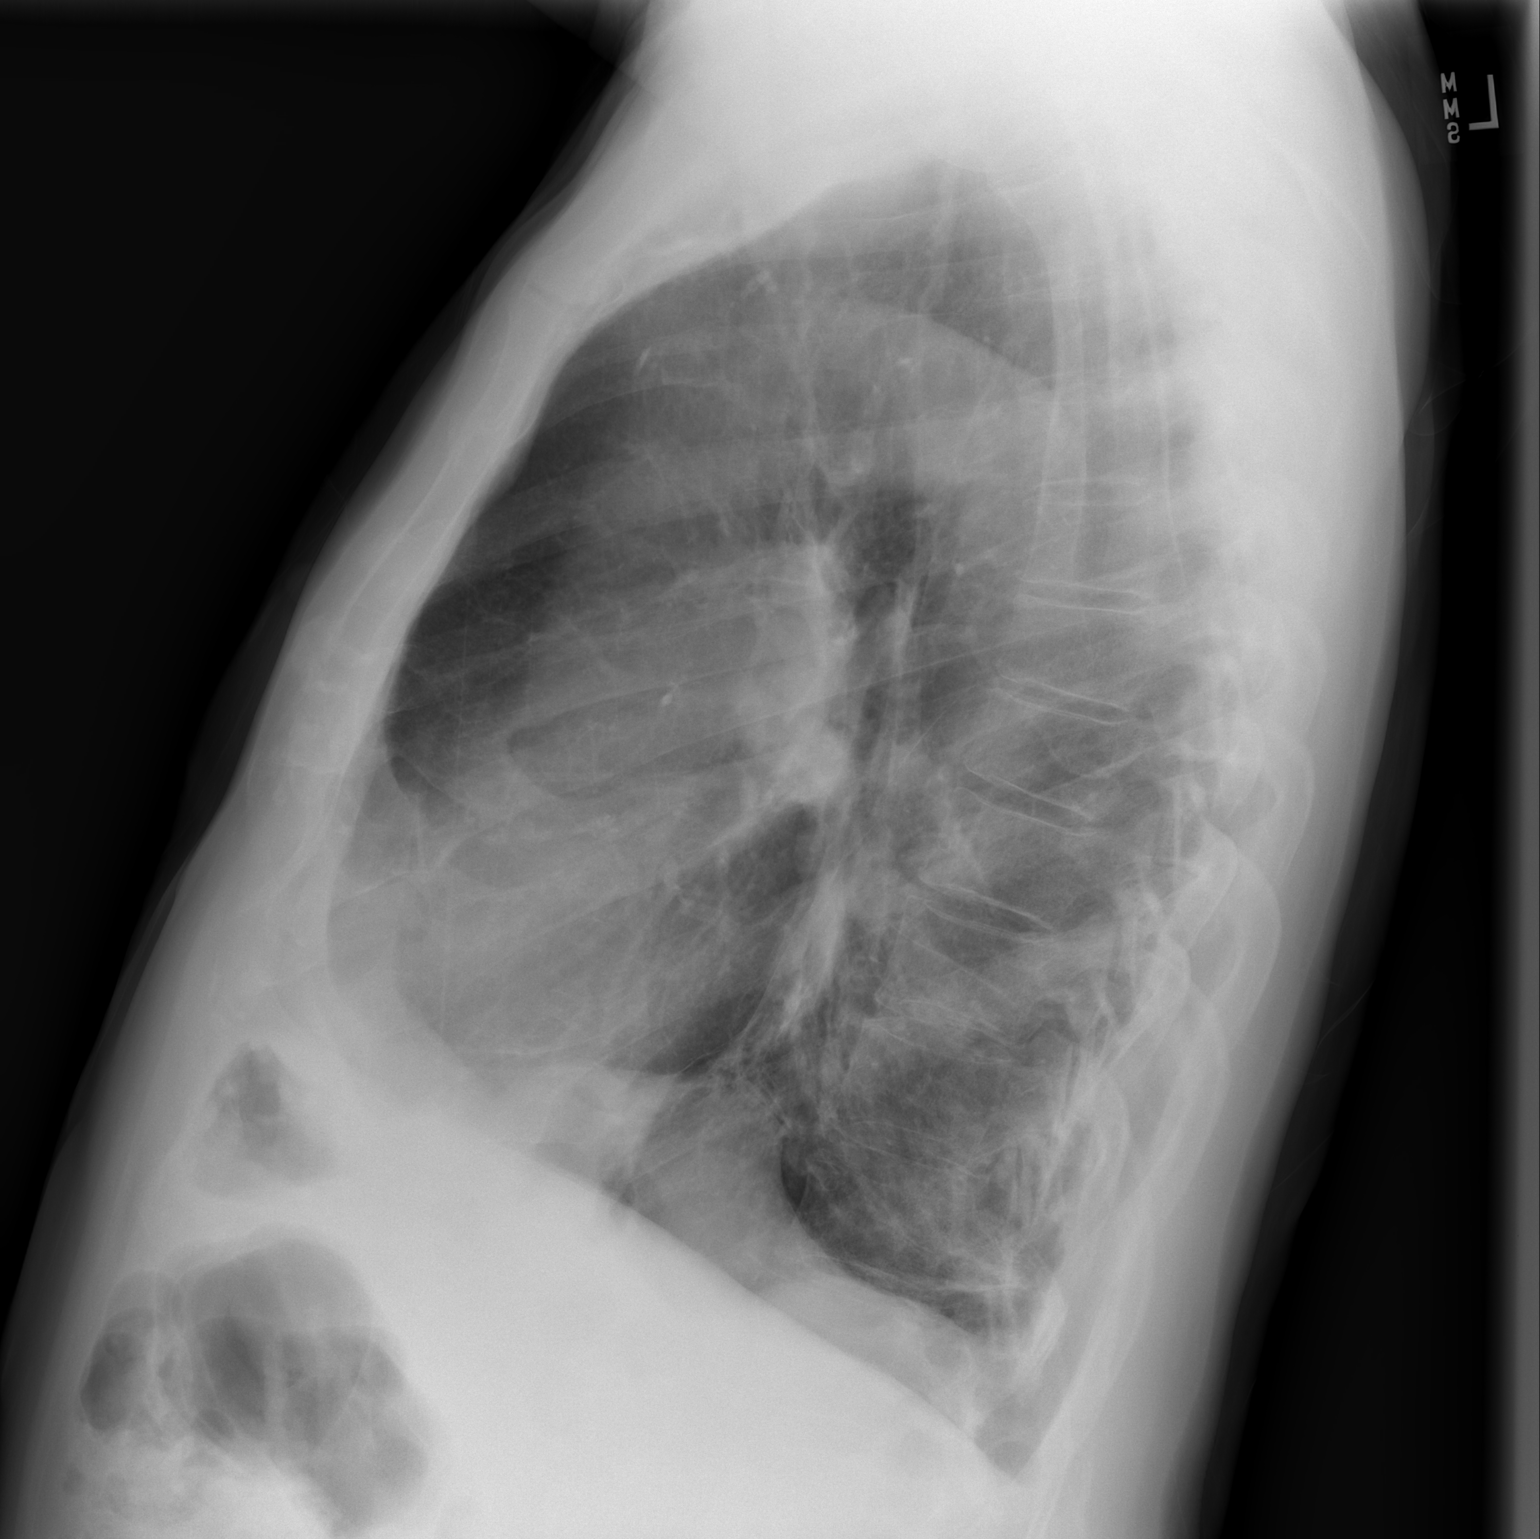

[2 of 2 positions shown; findings below may reference images not displayed]

FINDINGS: Heart size and mediastinal contours are within normal limits. Lungs
are hyperexpanded. Probable chronic scarring/atelectasis at the lung
bases. No new/confluent opacity to suggest a developing pneumonia.
No pleural effusion or pneumothorax is seen. No acute appearing
osseous abnormality.
IMPRESSION: 1. No active cardiopulmonary disease. No evidence of pneumonia or
pulmonary edema.
2. Hyperexpanded lungs indicating COPD.

## 2022-05-31 ENCOUNTER — Other Ambulatory Visit (HOSPITAL_COMMUNITY): Payer: Self-pay | Admitting: Gerontology

## 2022-05-31 ENCOUNTER — Ambulatory Visit (HOSPITAL_COMMUNITY)
Admission: RE | Admit: 2022-05-31 | Discharge: 2022-05-31 | Disposition: A | Discharge status: Home / Self Care | Payer: Medicare HMO | Source: Ambulatory Visit | Attending: Gerontology | Admitting: Gerontology

## 2022-05-31 DIAGNOSIS — Z96641 Presence of right artificial hip joint: Secondary | ICD-10-CM | POA: Diagnosis not present

## 2022-05-31 DIAGNOSIS — Z23 Encounter for immunization: Secondary | ICD-10-CM | POA: Diagnosis not present

## 2022-05-31 DIAGNOSIS — M25552 Pain in left hip: Secondary | ICD-10-CM | POA: Insufficient documentation

## 2022-05-31 DIAGNOSIS — M1990 Unspecified osteoarthritis, unspecified site: Secondary | ICD-10-CM | POA: Diagnosis not present

## 2022-05-31 DIAGNOSIS — I4821 Permanent atrial fibrillation: Secondary | ICD-10-CM | POA: Diagnosis not present

## 2022-05-31 DIAGNOSIS — I10 Essential (primary) hypertension: Secondary | ICD-10-CM | POA: Diagnosis not present

## 2022-06-02 ENCOUNTER — Ambulatory Visit: Payer: Medicare HMO | Admitting: Orthopaedic Surgery

## 2022-06-02 ENCOUNTER — Encounter: Payer: Self-pay | Admitting: Orthopaedic Surgery

## 2022-06-02 VITALS — Wt 186.6 lb

## 2022-06-02 DIAGNOSIS — F17210 Nicotine dependence, cigarettes, uncomplicated: Secondary | ICD-10-CM | POA: Diagnosis not present

## 2022-06-02 DIAGNOSIS — M25512 Pain in left shoulder: Secondary | ICD-10-CM | POA: Diagnosis not present

## 2022-06-02 DIAGNOSIS — G8929 Other chronic pain: Secondary | ICD-10-CM

## 2022-06-02 MED ORDER — HYDROCODONE-ACETAMINOPHEN 5-325 MG PO TABS: ORAL_TABLET | ORAL | 0 refills | Status: DC

## 2022-06-02 NOTE — Progress Notes (Signed)
My shoulder is sore  He has chronic pain of the left shoulder.  He has no new trauma.  He is using a walker now.  His heart is doing better.  He has no paresthesias.  ROM left shoulder forward 145, abduction 120, internal 25, external 25, extension 5, adduction full.  NV intact.  Encounter Diagnoses  Name Primary?   Chronic left shoulder pain Yes   Cigarette nicotine dependence without complication    I will stop the Toradol and resume his hydrocodone.  Return in three months.  Continue his exercises.  Call if any problem.  Precautions discussed.  Electronically Signed Sanjuana Kava, MD 9/13/20239:50 AM

## 2022-06-18 DIAGNOSIS — M792 Neuralgia and neuritis, unspecified: Secondary | ICD-10-CM | POA: Diagnosis not present

## 2022-06-18 DIAGNOSIS — I739 Peripheral vascular disease, unspecified: Secondary | ICD-10-CM | POA: Diagnosis not present

## 2022-06-18 DIAGNOSIS — L84 Corns and callosities: Secondary | ICD-10-CM | POA: Diagnosis not present

## 2022-06-18 DIAGNOSIS — L603 Nail dystrophy: Secondary | ICD-10-CM | POA: Diagnosis not present

## 2022-06-18 DIAGNOSIS — B351 Tinea unguium: Secondary | ICD-10-CM | POA: Diagnosis not present

## 2022-06-18 DIAGNOSIS — M2041 Other hammer toe(s) (acquired), right foot: Secondary | ICD-10-CM | POA: Diagnosis not present

## 2022-06-23 ENCOUNTER — Telehealth: Payer: Self-pay | Admitting: *Deleted

## 2022-06-23 NOTE — Chronic Care Management (AMB) (Signed)
  Care Coordination  Outreach Note  06/23/2022 Name: Larry Reese MRN: [id number] DOB: [birth year]   Care Coordination Outreach Attempts: An unsuccessful telephone outreach was attempted today to offer the patient information about available care coordination services as a benefit of their health plan.   Follow Up Plan:  Additional outreach attempts will be made to offer the patient care coordination information and services.   Encounter Outcome:  No Answer  [place]  Direct Dial: [phone]

## 2022-06-25 NOTE — Chronic Care Management (AMB) (Signed)
  Care Coordination   Note   06/25/2022 Name: Larry Reese MRN: [id number] DOB: 1946-07-20  Larry Reese is a 76 y.o. year old male who sees [name], [name], MD for primary care. I reached out to [name] by phone today to offer care coordination services.  Larry Reese was given information about Care Coordination services today including:   The Care Coordination services include support from the care team which includes your Nurse Coordinator, Clinical Social Worker, or Pharmacist.  The Care Coordination team is here to help remove barriers to the health concerns and goals most important to you. Care Coordination services are voluntary, and the patient may decline or stop services at any time by request to their care team member.   Care Coordination Consent Status: Patient agreed to services and verbal consent obtained.   Follow up plan:  Telephone appointment with care coordination team member scheduled for:  06/28/22  Encounter Outcome:  Pt. Scheduled  [place]  Direct Dial: [phone]

## 2022-06-28 ENCOUNTER — Ambulatory Visit: Payer: Self-pay | Admitting: *Deleted

## 2022-06-28 ENCOUNTER — Encounter: Payer: Self-pay | Admitting: *Deleted

## 2022-06-28 NOTE — Patient Instructions (Signed)
Visit Information  Thank you for taking time to visit with me today. Please don't hesitate to contact me if I can be of assistance to you before our next scheduled telephone appointment.  Following are the goals we discussed today:  Discuss pain control and request for power chair with provider  Our next appointment is by telephone on 11/29  Please call the care guide team at [phone] if you need to cancel or reschedule your appointment.   Please call the Suicide and Crisis Lifeline: 988 call the [place] National Suicide Prevention Lifeline: 847 758 7861 or TTY: 772-536-9141 TTY (715) 032-5786) to talk to a trained counselor call 1-800-273-TALK (toll free, 24 hour hotline) call the [facility]: [phone] call 911 if you are experiencing a Mental Health or [place] or need someone to talk to.  The patient verbalized understanding of instructions, educational materials, and care plan provided today and agreed to receive a mailed copy of patient instructions, educational materials, and care plan.   The patient has been provided with contact information for the care management team and has been advised to call with any health related questions or concerns.   Valente David, RN, MSN, [place] Care Management Care Management Coordinator [phone]

## 2022-06-28 NOTE — Patient Outreach (Addendum)
  Care Coordination   Initial Visit Note   06/28/2022 Name: Larry Reese MRN: [id number] DOB: Mar 16, 1946  Larry Reese is a 76 y.o. year old male who sees [name], [name], MD for primary care. I spoke with  [name] by phone today.  What matters to the patients health and wellness today?  Has ongoing chronic pain from hip and shoulder.  Active with ortho specialist for pain control.  Denies any urgent concerns, encouraged to contact this care manager with questions.      Goals Addressed               This Visit's Progress     Chronic pain control (pt-stated)        Care Coordination Interventions: Reviewed provider established plan for pain management Discussed importance of adherence to all scheduled medical appointments Counseled on the importance of reporting any/all new or changed pain symptoms or management strategies to pain management provider Advised patient to report to care team affect of pain on daily activities Discussed use of relaxation techniques and/or diversional activities to assist with pain reduction (distraction, imagery, relaxation, massage, acupressure, TENS, heat, and cold application Assessed social determinant of health barriers Advised to discuss request for power scooter with provider Explained pros and cons to having power scooter (decreases pain level with activity, however also decreases activity level and may make member dependent and more sedentary) Encouraged to record HR/BP trends         SDOH assessments and interventions completed:  Yes  SDOH Interventions Today    Flowsheet Row Most Recent Value  SDOH Interventions   Food Insecurity Interventions Intervention Not Indicated  Housing Interventions Intervention Not Indicated  Transportation Interventions Intervention Not Indicated  Utilities Interventions Intervention Not Indicated        Care Coordination Interventions Activated:  Yes  Care Coordination  Interventions:  Yes, provided   Follow up plan: Follow up call scheduled for 12/14    Encounter Outcome:  Pt. Visit Completed   Valente David, RN, MSN, [place] Care Management Care Management Coordinator [phone]

## 2022-06-30 DIAGNOSIS — I10 Essential (primary) hypertension: Secondary | ICD-10-CM | POA: Diagnosis not present

## 2022-06-30 DIAGNOSIS — Z96641 Presence of right artificial hip joint: Secondary | ICD-10-CM | POA: Diagnosis not present

## 2022-07-05 ENCOUNTER — Telehealth: Payer: Self-pay | Admitting: Radiology

## 2022-07-05 MED ORDER — HYDROCODONE-ACETAMINOPHEN 5-325 MG PO TABS: ORAL_TABLET | ORAL | 0 refills | Status: DC

## 2022-07-31 DIAGNOSIS — I4821 Permanent atrial fibrillation: Secondary | ICD-10-CM | POA: Diagnosis not present

## 2022-07-31 DIAGNOSIS — I10 Essential (primary) hypertension: Secondary | ICD-10-CM | POA: Diagnosis not present

## 2022-08-02 ENCOUNTER — Telehealth: Payer: Self-pay | Admitting: Orthopaedic Surgery

## 2022-08-18 ENCOUNTER — Encounter (HOSPITAL_BASED_OUTPATIENT_CLINIC_OR_DEPARTMENT_OTHER): Payer: Self-pay | Admitting: Nurse Practitioner

## 2022-08-18 ENCOUNTER — Ambulatory Visit (HOSPITAL_BASED_OUTPATIENT_CLINIC_OR_DEPARTMENT_OTHER): Payer: Medicare HMO | Admitting: Nurse Practitioner

## 2022-08-18 VITALS — BP 132/66 | HR 59 | Ht 78.5 in | Wt 192.6 lb

## 2022-08-18 DIAGNOSIS — I4821 Permanent atrial fibrillation: Secondary | ICD-10-CM

## 2022-08-18 DIAGNOSIS — I700 Atherosclerosis of aorta: Secondary | ICD-10-CM

## 2022-08-18 DIAGNOSIS — I77810 Thoracic aortic ectasia: Secondary | ICD-10-CM | POA: Diagnosis not present

## 2022-08-18 DIAGNOSIS — I10 Essential (primary) hypertension: Secondary | ICD-10-CM | POA: Diagnosis not present

## 2022-08-18 DIAGNOSIS — R0981 Nasal congestion: Secondary | ICD-10-CM | POA: Diagnosis not present

## 2022-08-18 NOTE — Patient Instructions (Signed)
Medication Instructions:   Your physician recommends that you continue on your current medications as directed. Please refer to the Current Medication list given to you today.  Please try Allegra or Zyrtec OTC for nasal congestion.   *If you need a refill on your cardiac medications before your next appointment, please call your pharmacy*   Lab Work:  TODAY!!!! CMET/LIPID  If you have labs (blood work) drawn today and your tests are completely normal, you will receive your results only by: [place] (if you have MyChart) OR A paper copy in the mail If you have any lab test that is abnormal or we need to change your treatment, we will call you to review the results.   Testing/Procedures:  Non-Cardiac CT Angiography (CTA), is a special type of CT scan that uses a computer to produce multi-dimensional views of major blood vessels throughout the body. In CT angiography, a contrast material is injected through an IV to help visualize the blood vessels   Follow-Up: At [facility], you and your health needs are our priority.  As part of our continuing mission to provide you with exceptional heart care, we have created designated Provider Care Teams.  These Care Teams include your primary Cardiologist (physician) and Advanced Practice Providers (APPs -  Physician Assistants and Nurse Practitioners) who all work together to provide you with the care you need, when you need it.  We recommend signing up for the patient portal called "MyChart".  Sign up information is provided on this After Visit Summary.  MyChart is used to connect with patients for Virtual Visits (Telemedicine).  Patients are able to view lab/test results, encounter notes, upcoming appointments, etc.  Non-urgent messages can be sent to your provider as well.   To learn more about what you can do with MyChart, go to [email or web address].    Your next appointment:   6 month(s)  The format for your next  appointment:   In Person  Provider:   Buford Dresser, MD    Important Information About [place]

## 2022-08-18 NOTE — Progress Notes (Signed)
Cardiology Office Note:    Date:  08/18/2022   ID:  DEKOTA Reese, DOB [birth year], MRN [id number]  PCP:  Carrolyn Meiers, MD   [facility] Providers Cardiologist:  Buford Dresser, MD Electrophysiologist:  Vickie Epley, MD     Referring MD: Carrolyn Meiers*   Chief Complaint: follow-up atrial fibrillation  History of Present Illness:    Larry Reese is a very pleasant 76 y.o. male with a hx of tobacco abuse, HTN, subdural hematoma 2006, persistent atrial fibrillation s/p Watchman device, dilation aortic root  Referred to cardiology for new onset atrial fibrillation at a preop visit August 2021.  He had a subdural hematoma in 2006.  As such, anticoagulation was deferred and watchman was placed.  2D echo 05/2021 LVEF 60 to 65%, no RWMA, RV normal size and function, LA mildly dilated, trivial MR, mild dilatation aortic root 40 mm. Cardiac monitor 05/2021 with continuous atrial fibrillation 31 bpm to 114 bpm with average HR 63 bpm. One pause of 3 seconds at 3:44 am, PVC burden less than 1%.  Last cardiology clinic visit was 01/19/2022. He reported recent hip surgery, starting PT soon.  Notes shortness of breath only with the cold weather, unchanged compared to previous.  He is bradycardic in clinic but denied lightheadedness, dizziness, near-syncope, syncope.  Home HR routinely 50s to 80s. No changes were made to treatment and he was advised to return in 6 months for follow-up.  Today, he is here accompanied by his wife. Reports he is feeling well from a heart perspective. Continues to have hip problems that limit his mobility. Previously had right hip surgery, now has swelling in the left and he has difficulty getting comfortable in the bed. No orthopnea, PND. At times when he stands, he feels weak and feels like he might fall. This causes his heart to race, otherwise he does not have any palpitations or tachycardia. No presyncope, syncope. Has a history of  nasal congestion, no shortness of breath, chest pain.   Past Medical History:  Diagnosis Date   A-fib [facility])    Anemia    Arthritis    Bradycardia    Dilatation of thoracic aorta (HCC)    dilated aortic root and ascending aorta   Dupuytren's contracture of right hand    Hx of adenomatous colonic polyps    Hypertension    Pneumothorax 09/18/1998   spontaneous right PTX 09/18/98, s/p chest tube   SDH (subdural hematoma) ([place]) 10/27/2004   left chronic SDH versus hygroma 10/27/04 CT   Stroke [facility]) 2005   left sided weakness   Trigger finger     Past Surgical History:  Procedure Laterality Date   COLONOSCOPY  May 2010   Dr. fields: 5 cm terminal ileum normal, 6 polyps removed, moderate internal hemorrhoids, simple adenomas, next colonoscopy May 2015   COLONOSCOPY N/A 08/01/2015   Procedure: COLONOSCOPY;  Surgeon: Danie Binder, MD;  [facility];  Service: Endoscopy;  Laterality: N/A;  0830   CRANIOTOMY  2000   pressure   INGUINAL HERNIA REPAIR Right    INGUINAL HERNIA REPAIR Left 02/09/2022   Procedure: OPEN LEFT INGUINAL HERNIA REPAIR;  Surgeon: Ralene Ok, MD;  Location: [place];  Service: General;  Laterality: Left;   INSERTION OF MESH Left 02/09/2022   Procedure: INSERTION OF MESH;  Surgeon: Ralene Ok, MD;  Location: [place];  Service: General;  Laterality: Left;   LEFT ATRIAL APPENDAGE OCCLUSION N/A 09/04/2020   Procedure:  LEFT ATRIAL APPENDAGE OCCLUSION;  Surgeon: Vickie Epley, MD;  Location: [place] CV LAB;  Service: Cardiovascular;  Laterality: N/A;   OLECRANON BURSECTOMY Right 02/11/2015   Procedure: EXCISION RIGHT OLECRANON BURSA;  Surgeon: Sanjuana Kava, MD;  Location: [facility];  Service: Orthopedics;  Laterality: Right;   repair of trigger finger Left    pinky   TEE WITHOUT CARDIOVERSION N/A 09/04/2020   Procedure: TRANSESOPHAGEAL ECHOCARDIOGRAM (TEE);  Surgeon: Vickie Epley, MD;  Location: [place] CV LAB;  Service: Cardiovascular;   Laterality: N/A;   TEE WITHOUT CARDIOVERSION N/A 10/13/2020   Procedure: TRANSESOPHAGEAL ECHOCARDIOGRAM (TEE);  Surgeon: Geralynn Rile, MD;  Location: [place];  Service: Cardiovascular;  Laterality: N/A;   TOTAL HIP ARTHROPLASTY Right 10/19/2021   Procedure: RIGHT TOTAL HIP ARTHROPLASTY ANTERIOR APPROACH;  Surgeon: Leandrew Koyanagi, MD;  Location: [place];  Service: Orthopedics;  Laterality: Right;  3-C    Current Medications: Current Meds  Medication Sig   acetaminophen (TYLENOL) 500 MG tablet Take 500-1,000 mg by mouth every 6 (six) hours as needed (for pain.).   amLODipine (NORVASC) 10 MG tablet Take 10 mg by mouth in the morning.   aspirin EC 81 MG tablet Take 1 tablet (81 mg total) by mouth 2 (two) times daily. To be taken after surgery to prevent blood clots   docusate sodium (COLACE) 100 MG capsule Take 1 capsule (100 mg total) by mouth daily as needed.   HYDROcodone-acetaminophen (NORCO/VICODIN) 5-325 MG tablet TAKE ONE TABLET BY MOUTH EVERY SIX HOURSAS NEEDED FOR PAIN MAY CAUSE DROWSINESS   Lidocaine-Menthol 4-1 % LIQD Apply 1 application. topically 3 (three) times daily as needed (pain.). NERVIVE PAIN RELIEVING Liquid Roll-on   tamsulosin (FLOMAX) 0.4 MG CAPS capsule Take 0.4 mg by mouth daily.    vitamin E 180 MG (400 UNITS) capsule Take 400 Units by mouth 2 (two) times a week.   [DISCONTINUED] methocarbamol (ROBAXIN) 500 MG tablet Take 1 tablet (500 mg total) by mouth 2 (two) times daily as needed. (Patient taking differently: Take 500 mg by mouth in the morning and at bedtime.)     Allergies:   Aleve [naproxen], Celebrex [celecoxib], and Fluoxetine hcl   Social History   Socioeconomic History   Marital status: Married    Spouse name: Not on file   Number of children: 3   Years of education: Not on file   Highest education level: Not on file  Occupational History   Occupation: retired  Tobacco Use   Smoking status: Every Day    Packs/day: 0.25    Years: 50.00     Total pack years: 12.50    Types: Cigarettes   Smokeless tobacco: Never   Tobacco comments:    5-6 cigarettes per day  Vaping Use   Vaping Use: Never used  Substance and Sexual Activity   Alcohol use: Not Currently   Drug use: No   Sexual activity: Yes    Birth control/protection: None  Other Topics Concern   Not on file  Social History Narrative   Not on file   Social Determinants of Health   Financial Resource Strain: Not on file  Food Insecurity: No Food Insecurity (06/28/2022)   Hunger Vital Sign    Worried About Running Out of Food in the Last Year: Never true    Ran Out of Food in the Last Year: Never true  Transportation Needs: No Transportation Needs (06/28/2022)   PRAPARE - Hydrologist (Medical):  No    Lack of Transportation (Non-Medical): No  Physical Activity: Not on file  Stress: Not on file  Social Connections: Not on file     Family History: The patient's family history includes Esophageal cancer in his father. There is no history of Colon cancer.  ROS:   Please see the history of present illness.     All other systems reviewed and are negative.  Labs/Other Studies Reviewed:    The following studies were reviewed today:  Cardiac Monitor 06/15/21 Atrial Fibrillation occurred continuously (100% burden), ranging from 31-114 bpm (avg of 63 bpm). 1 Pause occurred lasting 3 secs (20 bpm) on 05/22/21 at 3:44 AM. Isolated VEs were rare (<1.0%). There was 1 triggered event, which was atrial fibrillation.   Echo 06/03/21 1. Left ventricular ejection fraction, by estimation, is 60 to 65%. The  left ventricle has normal function. The left ventricle has no regional  wall motion abnormalities. Left ventricular diastolic function could not  be evaluated.   2. Right ventricular systolic function is normal. The right ventricular  size is normal. There is normal pulmonary artery systolic pressure. The  estimated right ventricular systolic  pressure is 16.1 mmHg.   3. Left atrial size was mildly dilated.   4. The mitral valve is normal in structure. Trivial mitral valve  regurgitation. No evidence of mitral stenosis.   5. The aortic valve is tricuspid. Aortic valve regurgitation is not  visualized. No aortic stenosis is present.   6. Aortic dilatation noted. There is mild dilatation of the aortic root,  measuring 40 mm.   7. The inferior vena cava is normal in size with greater than 50%  respiratory variability, suggesting right atrial pressure of 3 mmHg.   Comparison(s): No significant change from prior study.   Conclusion(s)/Recommendation(s): Otherwise normal echocardiogram, with  minor abnormalities described in the report.  Left Atrial Appendage Occlusion 09/04/20 CONCLUSIONS:  1.Successful implantation of a WATCHMAN left atrial appendage occlusive device    2. TEE demonstrating no LAA thrombus 3. No early apparent complications.  4. Post procedural anticoagulation strategy will be Apixaban 2.26m PO BID and Aspirin 827mPO daily.  Recent Labs: 10/15/2021: ALT 14 02/03/2022: BUN 11; Creatinine, Ser 1.14; Hemoglobin 11.6; Platelets 310; Potassium 3.6; Sodium 137  Recent Lipid Panel No results found for: "CHOL", "TRIG", "HDL", "CHOLHDL", "VLDL", "LDLCALC", "LDLDIRECT"   Risk Assessment/Calculations:     Physical Exam:    VS:  BP 132/66 (BP Location: Right Arm, Patient Position: Sitting, Cuff Size: Normal)   Pulse (!) 59   Ht 6' 6.5" (1.994 m)   Wt 192 lb 9.6 oz (87.4 kg)   SpO2 97%   BMI 21.97 kg/m     Wt Readings from Last 3 Encounters:  08/18/22 192 lb 9.6 oz (87.4 kg)  06/02/22 186 lb 9.6 oz (84.6 kg)  03/04/22 189 lb (85.7 kg)     GEN:  Well nourished, well developed in no acute distress HEENT: Normal NECK: No JVD; No carotid bruits CARDIAC: Irregular RR, no murmurs, rubs, gallops RESPIRATORY:  Clear to auscultation without rales, wheezing or rhonchi  ABDOMEN: Soft, non-tender,  non-distended MUSCULOSKELETAL:  No edema; No deformity. 2+ pedal pulses, equal bilaterally SKIN: Warm and dry NEUROLOGIC:  Alert and oriented x 3 PSYCHIATRIC:  Normal affect   EKG:  EKG is ordered today.  The ekg ordered today demonstrates atrial fibrillation with premature ventricular or aberrantly conducted complexes at well-controlled ventricular rate 90 bpm  Diagnoses:    1. Permanent atrial  fibrillation ([place])   2. Essential hypertension   3. Aortic root dilatation (HCC)   4. Nasal congestion   5. Aortic atherosclerosis (HCC)    Assessment and Plan:     Permanent atrial fibrillation: EKG demonstrates A-fib at well-controlled ventricular rate 90 bpm. Overall asymptomatic. Has not been on anticoagulation due to history of subdural hematoma. Would continue to hold off in the setting of fall risk. Not on beta-blocker in the setting of previous bradycardia. He prefers to take aspirin 81 mg twice daily for a fib.   Hypertension: BP is well-controlled today.  Aortic root dilatation: Mild aortic dilatation of aortic root measuring 40 mm on echo 05/2021, previous documentation of dilatation of 47 mm on CT 07/2020. We will get CTA for evaluation of aortic dilatation.  Aortic atherosclerosis/Hyperlipidemia LDL goal < 70: Aortic atherosclerosis noted on CT 07/2020. LDL 142 on 12/15/2021. Emphasized the importance of LDL goal < 70.  We will recheck lipid panel today. Discussed with patient and his wife that if LDL remains > 70, would recommend starting a statin.   Nasal congestion: He reports frequent nasal congestion and drainage. Recommended he may try Allegra or Zyrtec. Cautioned that Zyrtec can cause drowsiness.   Disposition: 6 months with Dr. Harrell Gave  Medication Adjustments/Labs and Tests Ordered: Current medicines are reviewed at length with the patient today.  Concerns regarding medicines are outlined above.  Orders Placed This Encounter  Procedures   CT ANGIO CHEST AORTA W/CM &  OR WO/CM   Comp Met (CMET)   Lipid Profile   No orders of the defined types were placed in this encounter.   Patient Instructions  Medication Instructions:   Your physician recommends that you continue on your current medications as directed. Please refer to the Current Medication list given to you today.  Please try Allegra or Zyrtec OTC for nasal congestion.   *If you need a refill on your cardiac medications before your next appointment, please call your pharmacy*   Lab Work:  TODAY!!!! CMET/LIPID  If you have labs (blood work) drawn today and your tests are completely normal, you will receive your results only by: [place] (if you have MyChart) OR A paper copy in the mail If you have any lab test that is abnormal or we need to change your treatment, we will call you to review the results.   Testing/Procedures:  Non-Cardiac CT Angiography (CTA), is a special type of CT scan that uses a computer to produce multi-dimensional views of major blood vessels throughout the body. In CT angiography, a contrast material is injected through an IV to help visualize the blood vessels   Follow-Up: At [facility], you and your health needs are our priority.  As part of our continuing mission to provide you with exceptional heart care, we have created designated Provider Care Teams.  These Care Teams include your primary Cardiologist (physician) and Advanced Practice Providers (APPs -  Physician Assistants and Nurse Practitioners) who all work together to provide you with the care you need, when you need it.  We recommend signing up for the patient portal called "MyChart".  Sign up information is provided on this After Visit Summary.  MyChart is used to connect with patients for Virtual Visits (Telemedicine).  Patients are able to view lab/test results, encounter notes, upcoming appointments, etc.  Non-urgent messages can be sent to your provider as well.   To learn more about  what you can do with MyChart, go to [email or web address].  Your next appointment:   6 month(s)  The format for your next appointment:   In Person  Provider:   Buford Dresser, MD    Important Information About Sugar         Signed, Emmaline Life, NP  08/18/2022 1:07 PM    [place]

## 2022-08-19 LAB — COMPREHENSIVE METABOLIC PANEL
ALT: 8 IU/L (ref 0–44)
AST: 14 IU/L (ref 0–40)
Albumin/Globulin Ratio: 1.7 (ref 1.2–2.2)
Albumin: 4.8 g/dL (ref 3.8–4.8)
Alkaline Phosphatase: 144 IU/L — ABNORMAL HIGH (ref 44–121)
BUN/Creatinine Ratio: 9 — ABNORMAL LOW (ref 10–24)
BUN: 10 mg/dL (ref 8–27)
Bilirubin Total: 1 mg/dL (ref 0.0–1.2)
CO2: 22 mmol/L (ref 20–29)
Calcium: 9.9 mg/dL (ref 8.6–10.2)
Chloride: 100 mmol/L (ref 96–106)
Creatinine, Ser: 1.14 mg/dL (ref 0.76–1.27)
Globulin, Total: 2.8 g/dL (ref 1.5–4.5)
Glucose: 107 mg/dL — ABNORMAL HIGH (ref 70–99)
Potassium: 4.4 mmol/L (ref 3.5–5.2)
Sodium: 138 mmol/L (ref 134–144)
Total Protein: 7.6 g/dL (ref 6.0–8.5)
eGFR: 67 mL/min/{1.73_m2} (ref >59)

## 2022-08-19 LAB — LIPID PANEL
Chol/HDL Ratio: 3.8 ratio (ref 0.0–5.0)
Cholesterol, Total: 212 mg/dL — ABNORMAL HIGH (ref 100–199)
HDL: 56 mg/dL (ref >39)
LDL Chol Calc (NIH): 141 mg/dL — ABNORMAL HIGH (ref 0–99)
Triglycerides: 85 mg/dL (ref 0–149)
VLDL Cholesterol Cal: 15 mg/dL (ref 5–40)

## 2022-08-24 ENCOUNTER — Telehealth (HOSPITAL_BASED_OUTPATIENT_CLINIC_OR_DEPARTMENT_OTHER): Payer: Self-pay | Admitting: Nurse Practitioner

## 2022-08-24 NOTE — Telephone Encounter (Signed)
Left message for patient to call and discuss scheduling the CTA chest/aorta ordered by Christen Bame, NP

## 2022-08-26 NOTE — Telephone Encounter (Signed)
Spoke with patient regarding the upcoming 08/31/22 6:00 pm appointment for the CTA chest/aorta ordered by Christen Bame, NP.  Patient to arrive at 5:30 pm ground floor imaging for check in---liquids only 4 hours prior to study---patient voiced his understanding.

## 2022-08-30 DIAGNOSIS — I10 Essential (primary) hypertension: Secondary | ICD-10-CM | POA: Diagnosis not present

## 2022-08-30 DIAGNOSIS — I4821 Permanent atrial fibrillation: Secondary | ICD-10-CM | POA: Diagnosis not present

## 2022-08-31 ENCOUNTER — Ambulatory Visit (HOSPITAL_BASED_OUTPATIENT_CLINIC_OR_DEPARTMENT_OTHER)
Admission: RE | Admit: 2022-08-31 | Discharge: 2022-08-31 | Disposition: A | Discharge status: Home / Self Care | Payer: Medicare HMO | Source: Ambulatory Visit | Attending: Nurse Practitioner | Admitting: Nurse Practitioner

## 2022-08-31 DIAGNOSIS — I4821 Permanent atrial fibrillation: Secondary | ICD-10-CM | POA: Diagnosis not present

## 2022-08-31 DIAGNOSIS — I77810 Thoracic aortic ectasia: Secondary | ICD-10-CM | POA: Insufficient documentation

## 2022-08-31 DIAGNOSIS — I10 Essential (primary) hypertension: Secondary | ICD-10-CM | POA: Diagnosis not present

## 2022-08-31 DIAGNOSIS — I7121 Aneurysm of the ascending aorta, without rupture: Secondary | ICD-10-CM | POA: Diagnosis not present

## 2022-08-31 MED ORDER — IOHEXOL 350 MG/ML SOLN
100.0000 mL | Freq: Once | INTRAVENOUS | Status: AC | PRN
  Administered 2022-08-31: 80 mL via INTRAVENOUS

## 2022-09-01 ENCOUNTER — Telehealth (HOSPITAL_BASED_OUTPATIENT_CLINIC_OR_DEPARTMENT_OTHER): Payer: Self-pay

## 2022-09-01 ENCOUNTER — Encounter: Payer: Self-pay | Admitting: Orthopaedic Surgery

## 2022-09-01 ENCOUNTER — Ambulatory Visit: Payer: Medicare HMO | Admitting: Orthopaedic Surgery

## 2022-09-01 VITALS — BP 142/80 | HR 58 | Ht 79.0 in | Wt 193.0 lb

## 2022-09-01 DIAGNOSIS — G8929 Other chronic pain: Secondary | ICD-10-CM

## 2022-09-01 DIAGNOSIS — M25512 Pain in left shoulder: Secondary | ICD-10-CM | POA: Diagnosis not present

## 2022-09-01 DIAGNOSIS — F17210 Nicotine dependence, cigarettes, uncomplicated: Secondary | ICD-10-CM

## 2022-09-01 DIAGNOSIS — I7121 Aneurysm of the ascending aorta, without rupture: Secondary | ICD-10-CM

## 2022-09-01 MED ORDER — HYDROCODONE-ACETAMINOPHEN 7.5-325 MG PO TABS: 1.0000 | ORAL_TABLET | Freq: Four times a day (QID) | ORAL | 0 refills | Status: DC | PRN

## 2022-09-01 NOTE — Telephone Encounter (Addendum)
Call attempt, no answer, unable to leave message due to VM not being set up.     Reached out to Laurann Montana, NP for clarification of results,   Per CW,  "it previously was 63m now 523m given it has increased is size is why we are referring him to cardiothoracic surgery. prevent it from worsening by keeping blood pressure well controlled. important to meet with cardiothoracic surgery to discuss possible next steps.  given his elevated cholesterol, known coronary calcifications (also noted on CT 2021), and aortic aneurysm he should start Rosuvastatin '10mg'$  for secondary prevention. "   ----- Message from MiEmmaline LifeNP sent at 09/01/2022 11:15 AM EST ----- CT of aorta reveals 5.0 aneurysmal dilation of the ascending aorta. Please refer to CT surgery for evaluation. If he is having any concerning symptoms of chest pain or shortness of breath, he should see Dr. ChHarrell Gaver APP soon as his CT shows multivessel coronary atherosclerosis.

## 2022-09-01 NOTE — Progress Notes (Signed)
My shoulder hurts.  He has chronic pain of the left shoulder.  He has no swelling, no paresthesias.  He has pain lifting over head.  Examination of left Upper Extremity is done.  Inspection:   Overall:  Elbow non-tender without crepitus or defects, forearm non-tender without crepitus or defects, wrist non-tender without crepitus or defects, hand non-tender.    Shoulder: with glenohumeral joint tenderness, without effusion.   Upper arm:  without swelling and tenderness   Range of motion:   Overall:  Full range of motion of the elbow, full range of motion of wrist and full range of motion in fingers.   Shoulder:  left  145 degrees forward flexion; 120 degrees abduction; 25 degrees internal rotation, 25 degrees external rotation, 5 degrees extension, 40 degrees adduction.   Stability:   Overall:  Shoulder, elbow and wrist stable   Strength and Tone:   Overall full shoulder muscles strength, full upper arm strength and normal upper arm bulk and tone.   Encounter Diagnoses  Name Primary?   Chronic left shoulder pain Yes   Cigarette nicotine dependence without complication    Return in three months.  I have reviewed the [place] web site prior to prescribing narcotic medicine for this patient.  Call if any problem.  Precautions discussed.  Electronically Signed Sanjuana Kava, MD 12/13/20239:38 AM

## 2022-09-02 ENCOUNTER — Ambulatory Visit: Payer: Self-pay | Admitting: *Deleted

## 2022-09-02 ENCOUNTER — Encounter: Payer: Self-pay | Admitting: *Deleted

## 2022-09-02 MED ORDER — ROSUVASTATIN CALCIUM 10 MG PO TABS: 10.0000 mg | ORAL_TABLET | Freq: Every day | ORAL | 3 refills | Status: DC

## 2022-09-02 NOTE — Telephone Encounter (Signed)
Transferred call from RN, results given to patient and referral placed.     Per CW,  "it previously was 62m now 5110m given it has increased is size is why we are referring him to cardiothoracic surgery. prevent it from worsening by keeping blood pressure well controlled. important to meet with cardiothoracic surgery to discuss possible next steps.  given his elevated cholesterol, known coronary calcifications (also noted on CT 2021), and aortic aneurysm he should start Rosuvastatin '10mg'$  for secondary prevention. "    ----- Message from MiEmmaline LifeNP sent at 09/01/2022 11:15 AM EST ----- CT of aorta reveals 5.0 aneurysmal dilation of the ascending aorta. Please refer to CT surgery for evaluation. If he is having any concerning symptoms of chest pain or shortness of breath, he should see Dr. ChHarrell Gaver APP soon as his CT shows multivessel coronary atherosclerosis.

## 2022-09-02 NOTE — Addendum Note (Signed)
Addended by: Gerald Stabs on: 09/02/2022 11:48 AM   Modules accepted: Orders

## 2022-09-02 NOTE — Telephone Encounter (Signed)
2nd call attempt, no answer, unable to leave message due to VM box not being set up.       Reached out to Laurann Montana, NP for clarification of results,    Per CW,  "it previously was 36m now 542m given it has increased is size is why we are referring him to cardiothoracic surgery. prevent it from worsening by keeping blood pressure well controlled. important to meet with cardiothoracic surgery to discuss possible next steps.  given his elevated cholesterol, known coronary calcifications (also noted on CT 2021), and aortic aneurysm he should start Rosuvastatin '10mg'$  for secondary prevention. "    ----- Message from MiEmmaline LifeNP sent at 09/01/2022 11:15 AM EST ----- CT of aorta reveals 5.0 aneurysmal dilation of the ascending aorta. Please refer to CT surgery for evaluation. If he is having any concerning symptoms of chest pain or shortness of breath, he should see Dr. ChHarrell Gaver APP soon as his CT shows multivessel coronary atherosclerosis.

## 2022-09-02 NOTE — Patient Outreach (Signed)
  Care Coordination   Follow Up Visit Note   09/02/2022 Name: Larry Reese MRN: [id number] DOB: [birth year]  Larry Reese is a 76 y.o. year old male who sees [name], [name], MD for primary care. I spoke with  [name] by phone today.  What matters to the patients health and wellness today?  Managing arthritis pain and following up on aneurysm     Goals Addressed               This Visit's Progress     Patient Stated     Chronic pain control (pt-stated)        Care Coordination Interventions: Reviewed provider established plan for pain management Discussed importance of adherence to all scheduled medical appointments Counseled on the importance of reporting any/all new or changed pain symptoms or management strategies to pain management provider Advised patient to report to care team affect of pain on daily activities Assessed social determinant of health barriers Reviewed and discussed visit with Ortho, Dr [name], yesterday Reviewed and discussed that pain medication was sent to pharmacy. He should check with them today to see if it is ready for pickup Encouraged to follow-up with ortho as recommended Provided with RN Care Coordinator telephone number and encouraged to reach out as needed       Other     Aortic Aneurysm        Care Coordination Interventions: Reviewed results of CT angiogram and recommednation for cardiothoracic surgeon due to size enlargmenet from 10m to 560mCollaborated with KaElonda HuskyRN ([facility]io at [facility]arding unsuccessful attempts to reach patient with results. Successfully transferred patient's call to KaDaphene Jaegero that she could review the results and recommendations Reviewed and updated care team Provided with RNWeisbrod Memorial County Hospitalelephone number and encouraged to reach out as needed         SDOH assessments and interventions completed:  Yes  SDOH Interventions Today    Flowsheet Row Most Recent Value  SDOH  Interventions   Housing Interventions Intervention Not Indicated  Transportation Interventions Intervention Not Indicated  Financial Strain Interventions Intervention Not Indicated        Care Coordination Interventions:  Yes, provided   Follow up plan: Follow up call scheduled for 09/17/22    Encounter Outcome:  Pt. Visit Completed   KrChong SicilianBSN, RN-BC RN Care Coordinator [phone] #: [phone]

## 2022-09-03 DIAGNOSIS — L84 Corns and callosities: Secondary | ICD-10-CM | POA: Diagnosis not present

## 2022-09-03 DIAGNOSIS — M792 Neuralgia and neuritis, unspecified: Secondary | ICD-10-CM | POA: Diagnosis not present

## 2022-09-03 DIAGNOSIS — B351 Tinea unguium: Secondary | ICD-10-CM | POA: Diagnosis not present

## 2022-09-03 DIAGNOSIS — M2041 Other hammer toe(s) (acquired), right foot: Secondary | ICD-10-CM | POA: Diagnosis not present

## 2022-09-03 DIAGNOSIS — I739 Peripheral vascular disease, unspecified: Secondary | ICD-10-CM | POA: Diagnosis not present

## 2022-09-03 DIAGNOSIS — L603 Nail dystrophy: Secondary | ICD-10-CM | POA: Diagnosis not present

## 2022-09-17 ENCOUNTER — Other Ambulatory Visit (HOSPITAL_BASED_OUTPATIENT_CLINIC_OR_DEPARTMENT_OTHER): Payer: Self-pay | Admitting: Family

## 2022-09-17 ENCOUNTER — Encounter: Payer: Self-pay | Admitting: *Deleted

## 2022-09-17 ENCOUNTER — Ambulatory Visit: Payer: Self-pay | Admitting: *Deleted

## 2022-09-17 DIAGNOSIS — I7121 Aneurysm of the ascending aorta, without rupture: Secondary | ICD-10-CM

## 2022-09-17 NOTE — Telephone Encounter (Signed)
-----   Message from Ilean China, RN sent at 09/17/2022  3:22 PM EST ----- Regarding: Needs f/u with Dr Melvyn Novas. 3 admissions within past month Patient needs f/u with Dr Melvyn Novas. Has had 3 ED to Admissions for COPD exacerbation since November.   Thank you for your help and please let me know if I can be of any assistance.   Chong Sicilian, BSN, RN-BC RN Care Coordinator [place] Direct Dial: [phone] Main #: [phone]

## 2022-09-21 NOTE — Patient Outreach (Signed)
  Care Coordination   Follow Up Visit Note   09/18/2023 Name: Larry Reese MRN: [id number] DOB: 06/04/1946  Larry Reese is a 77 y.o. year old male who sees Larry Reese, Larry Baxter, MD for primary care. I spoke with  Larry Reese by phone today.  What matters to the patients health and wellness today?  Scheduling appt with Larry Reese re: AAA    Goals Addressed             This Visit's Progress    Aortic Aneurysm       Care Coordination Interventions: Reviewed results of CT angiogram and recommednation for cardiothoracic surgeon due to size enlargmenet from 20m to 562mCollaborated with Larry MontanaNP (cardiology) regarding referral to Larry Reese that was cancelled and marked as "Do Not Schedule". Research was done by cardio & Larry Reese  reason for cancellation was unknown. A new [facility] now been [facility] with Larry Reese and notified of appointment.  Encouraged patient to reach out to Larry Reeses needed         SDOH assessments and interventions completed:  Yes  SDOH Interventions Today    Flowsheet Row Most Recent Value  SDOH Interventions   Transportation Interventions Intervention Not Indicated  Financial Strain Interventions Intervention Not Indicated        Care Coordination Interventions:  Yes, provided   Follow up plan: Follow up call scheduled for 09/22/22    Encounter Outcome:  Pt. Visit Completed   Larry SicilianBSN, RN-BC RN Care Coordinator CoRipleyirect Dial: [phone] #: [phone]

## 2022-09-21 NOTE — Telephone Encounter (Signed)
This was sent in error per Chong Sicilian, BSN, RN-BC.  Please disregard.   This encounter was created in error - please disregard. This encounter was created in error - please disregard.

## 2022-09-22 ENCOUNTER — Ambulatory Visit: Payer: Self-pay | Admitting: *Deleted

## 2022-09-22 ENCOUNTER — Encounter: Payer: Self-pay | Admitting: *Deleted

## 2022-09-22 NOTE — Patient Outreach (Signed)
  Care Coordination   Follow Up Visit Note   09/22/2022 Name: Larry Reese MRN: [id number] DOB: [birth year]  Larry Reese is a 77 y.o. year old male who sees [name], [name], MD for primary care. I  spoke with wife, Larry Reese, by telephone today.  What matters to the patients health and wellness today? Seeing someone about his AAA    Goals Addressed             This Visit's Progress    Aortic Aneurysm       Care Coordination Interventions: Reviewed results of CT angiogram and recommednation for cardiothoracic surgeon due to size enlargmenet from 56m to 585mCollaborated with [name] (cardiology) regarding referral to [facility] that was cancelled and marked as "Do Not Schedule". Research was done by cardio & [facility]  reason for cancellation was unknown. A new referral was placed by [facility] has now been scheduled with [facility] and notified of appointment.  Talked with patient's wife, Larry Reese, to verify appt information. She was given a date of 10/02/22, but appointment is scheduled with Dr [facility]/04/22 at 1:00.  Provided with telephone number and address [facility]  [address] [phone] to call and verify appt details since there has been a lot of confusion around this appointment and scheduling Encouraged to reach out to [facility] needed          SDOH assessments and interventions completed:  No     Care Coordination Interventions:  Yes, provided   Follow up plan: Follow up call scheduled for 10/08/22    Encounter Outcome:  Pt. Visit Completed   KrChong SicilianBSN, RN-BC RN Care Coordinator Co[facility]ymat[facility] [phone] #: [phone]

## 2022-09-30 DIAGNOSIS — I10 Essential (primary) hypertension: Secondary | ICD-10-CM | POA: Diagnosis not present

## 2022-09-30 DIAGNOSIS — I4821 Permanent atrial fibrillation: Secondary | ICD-10-CM | POA: Diagnosis not present

## 2022-10-03 NOTE — Progress Notes (Unsigned)
[address]       [place], [address]             [place] Record #[id number] Date of Birth: [birth year]  Loel Dubonnet, NP Carrolyn Meiers, MD  Chief Complaint:   No chief complaint on file.   History of Present Illness:           Past Medical History:  Diagnosis Date   A-fib ([place])    Anemia    Arthritis    Bradycardia    Dilatation of thoracic aorta (HCC)    dilated aortic root and ascending aorta   Dupuytren's contracture of right hand    Hx of adenomatous colonic polyps    Hypertension    Pneumothorax 09/18/1998   spontaneous right PTX 09/18/98, s/p chest tube   SDH (subdural hematoma) ([place]) 10/27/2004   left chronic SDH versus hygroma 10/27/04 CT   Stroke [facility]) 2005   left sided weakness   Trigger finger     Past Surgical History:  Procedure Laterality Date   COLONOSCOPY  May 2010   Dr. fields: 5 cm terminal ileum normal, 6 polyps removed, moderate internal hemorrhoids, simple adenomas, next colonoscopy May 2015   COLONOSCOPY N/A 08/01/2015   Procedure: COLONOSCOPY;  Surgeon: Danie Binder, MD;  [facility]: [facility];  Service: Endoscopy;  Laterality: N/A;  0830   CRANIOTOMY  2000   pressure   INGUINAL HERNIA REPAIR Right    INGUINAL HERNIA REPAIR Left 02/09/2022   Procedure: OPEN LEFT INGUINAL HERNIA REPAIR;  Surgeon: Ralene Ok, MD;  Location: [place];  Service: General;  Laterality: Left;   INSERTION OF MESH Left 02/09/2022   Procedure: INSERTION OF MESH;  Surgeon: Ralene Ok, MD;  Location: [place];  Service: General;  Laterality: Left;   LEFT ATRIAL APPENDAGE OCCLUSION N/A 09/04/2020   Procedure: LEFT ATRIAL APPENDAGE OCCLUSION;  Surgeon: Vickie Epley, MD;  Location: [place] CV LAB;  Service: Cardiovascular;  Laterality: N/A;   OLECRANON BURSECTOMY Right 02/11/2015   Procedure: EXCISION RIGHT OLECRANON BURSA;  Surgeon: Sanjuana Kava, MD;  Location: [facility]  ORS;  Service: Orthopedics;  Laterality: Right;   repair of trigger finger Left    pinky   TEE WITHOUT CARDIOVERSION N/A 09/04/2020   Procedure: TRANSESOPHAGEAL ECHOCARDIOGRAM (TEE);  Surgeon: Vickie Epley, MD;  Location: [place] CV LAB;  Service: Cardiovascular;  Laterality: N/A;   TEE WITHOUT CARDIOVERSION N/A 10/13/2020   Procedure: TRANSESOPHAGEAL ECHOCARDIOGRAM (TEE);  Surgeon: Geralynn Rile, MD;  Location: [place];  Service: Cardiovascular;  Laterality: N/A;   TOTAL HIP ARTHROPLASTY Right 10/19/2021   Procedure: RIGHT TOTAL HIP ARTHROPLASTY ANTERIOR APPROACH;  Surgeon: Leandrew Koyanagi, MD;  Location: [place];  Service: Orthopedics;  Laterality: Right;  3-C    Social History   Tobacco Use  Smoking Status Every Day   Packs/day: 0.25   Years: 50.00   Total pack years: 12.50   Types: Cigarettes  Smokeless Tobacco Never  Tobacco Comments   5-6 cigarettes per day    Social History   Substance and Sexual Activity  Alcohol Use Not Currently    Social History   Socioeconomic History   Marital status: Married    Spouse name: Not on file   Number of children: 3   Years of education: Not on file   Highest education level: Not on file  Occupational History   Occupation: retired  Tobacco Use   Smoking status: Every Day    Packs/day: 0.25    Years: 50.00    Total pack years: 12.50    Types: Cigarettes   Smokeless tobacco: Never   Tobacco comments:    5-6 cigarettes per day  Vaping Use   Vaping Use: Never used  Substance and Sexual Activity   Alcohol use: Not Currently   Drug use: No   Sexual activity: Yes    Birth control/protection: None  Other Topics Concern   Not on file  Social History Narrative   Not on file   Social Determinants of Health   Financial Resource Strain: Low Risk  (09/17/2022)   Overall Financial Resource Strain (CARDIA)    Difficulty of Paying Living Expenses: Not very hard  Food Insecurity: No Food Insecurity (06/28/2022)    Hunger Vital Sign    Worried About Running Out of Food in the Last Year: Never true    Ran Out of Food in the Last Year: Never true  Transportation Needs: No Transportation Needs (09/17/2022)   PRAPARE - Hydrologist (Medical): No    Lack of Transportation (Non-Medical): No  Physical Activity: Not on file  Stress: Not on file  Social Connections: Not on file  Intimate Partner Violence: Not on file    Allergies  Allergen Reactions   Aleve [Naproxen] Other (See Comments)    Numbness in extremities    Celebrex [Celecoxib] Other (See Comments)    Celebrex. Nose bleed.   Fluoxetine Hcl Swelling    Current Outpatient Medications  Medication Sig Dispense Refill   acetaminophen (TYLENOL) 500 MG tablet Take 500-1,000 mg by mouth every 6 (six) hours as needed (for pain.).     amLODipine (NORVASC) 10 MG tablet Take 10 mg by mouth in the morning.     aspirin EC 81 MG tablet Take 1 tablet (81 mg total) by mouth 2 (two) times daily. To be taken after surgery to prevent blood clots 84 tablet 0   docusate sodium (COLACE) 100 MG capsule Take 1 capsule (100 mg total) by mouth daily as needed. 30 capsule 2   HYDROcodone-acetaminophen (NORCO) 7.5-325 MG tablet Take 1 tablet by mouth every 6 (six) hours as needed for moderate pain (Must last 30 days.). 60 tablet 0   Lidocaine-Menthol 4-1 % LIQD Apply 1 application. topically 3 (three) times daily as needed (pain.). NERVIVE PAIN RELIEVING Liquid Roll-on     rosuvastatin (CRESTOR) 10 MG tablet Take 1 tablet (10 mg total) by mouth daily. 90 tablet 3   tamsulosin (FLOMAX) 0.4 MG CAPS capsule Take 0.4 mg by mouth daily.      traZODone (DESYREL) 100 MG tablet Take 100 mg by mouth at bedtime as needed for sleep.     vitamin E 180 MG (400 UNITS) capsule Take 400 Units by mouth 2 (two) times a week.     No current facility-administered medications for this visit.     Family History  Problem Relation Age of Onset   Esophageal  cancer Father    Colon cancer Neg Hx        Physical Exam: There were no vitals taken for this visit.     Diagnostic Studies & Laboratory data: I have personally reviewed the following studies and agree with the findings  TTE (05/2022)  IMPRESSIONS   1. Left ventricular ejection fraction, by estimation, is 60 to 65%. The  left ventricle has normal  function. The left ventricle has no regional  wall motion abnormalities. Left ventricular diastolic function could not  be evaluated.   2. Right ventricular systolic function is normal. The right ventricular  size is normal. There is normal pulmonary artery systolic pressure. The  estimated right ventricular systolic pressure is 26.3 mmHg.   3. Left atrial size was mildly dilated.   4. The mitral valve is normal in structure. Trivial mitral valve  regurgitation. No evidence of mitral stenosis.   5. The aortic valve is tricuspid. Aortic valve regurgitation is not  visualized. No aortic stenosis is present.   6. Aortic dilatation noted. There is mild dilatation of the aortic root,  measuring 40 mm.   7. The inferior vena cava is normal in size with greater than 50%  respiratory variability, suggesting right atrial pressure of 3 mmHg.   Comparison(s): No significant change from prior study.   Conclusion(s)/Recommendation(s): Otherwise normal echocardiogram, with  minor abnormalities described in the report.   FINDINGS   Left Ventricle: Left ventricular ejection fraction, by estimation, is 60  to 65%. The left ventricle has normal function. The left ventricle has no  regional wall motion abnormalities. The left ventricular internal cavity  size was normal in size. There is   no left ventricular hypertrophy. Left ventricular diastolic function  could not be evaluated due to atrial fibrillation. Left ventricular  diastolic function could not be evaluated.   Right Ventricle: The right ventricular size is normal. No increase in  right  ventricular wall thickness. Right ventricular systolic function is  normal. There is normal pulmonary artery systolic pressure. The tricuspid  regurgitant velocity is 2.20 m/s, and   with an assumed right atrial pressure of 3 mmHg, the estimated right  ventricular systolic pressure is 78.5 mmHg.   Left Atrium: Left atrial size was mildly dilated.   Right Atrium: Right atrial size was normal in size.   Pericardium: There is no evidence of pericardial effusion.   Mitral Valve: The mitral valve is normal in structure. Mild mitral annular  calcification. Trivial mitral valve regurgitation. No evidence of mitral  valve stenosis.   Tricuspid Valve: The tricuspid valve is normal in structure. Tricuspid  valve regurgitation is mild . No evidence of tricuspid stenosis.   Aortic Valve: The aortic valve is tricuspid. Aortic valve regurgitation is  not visualized. No aortic stenosis is present.   Pulmonic Valve: The pulmonic valve was not well visualized. Pulmonic valve  regurgitation is trivial. No evidence of pulmonic stenosis.   Aorta: Aortic dilatation noted. There is mild dilatation of the aortic  root, measuring 40 mm.   Venous: The inferior vena cava is normal in size with greater than 50%  respiratory variability, suggesting right atrial pressure of 3 mmHg.   IAS/Shunts: The atrial septum is grossly normal.     LEFT VENTRICLE  PLAX 2D  LVIDd:         3.92 cm  Diastology  LVIDs:         2.67 cm  LV e' medial:    11.70 cm/s  LV PW:         1.07 cm  LV E/e' medial:  7.4  LV IVS:        1.19 cm  LV e' lateral:   13.60 cm/s  LVOT diam:     2.20 cm  LV E/e' lateral: 6.4  LVOT Area:     3.80 cm     RIGHT VENTRICLE  RV S prime:  12.80 cm/s  TAPSE (M-mode): 1.8 cm   LEFT ATRIUM             Index       RIGHT ATRIUM           Index  LA diam:        3.70 cm 1.64 cm/m  RA Area:     16.50 cm  LA Vol (A2C):   71.2 ml 31.48 ml/m RA Volume:   38.40 ml  16.98 ml/m  LA Vol (A4C):    65.4 ml 28.92 ml/m  LA Biplane Vol: 71.5 ml 31.62 ml/m     AORTA  Ao Root diam: 3.80 cm  Ao Asc diam:  4.00 cm   MITRAL VALVE               TRICUSPID VALVE  MV Area (PHT): 2.66 cm    TR Peak grad:   19.4 mmHg  MV Decel Time: 285 msec    TR Vmax:        220.00 cm/s  MV E velocity: 87.00 cm/s                             SHUNTS                             Systemic Diam: 2.20 cm    Recent Radiology Findings:   CTA 09/01/2022) IMPRESSION: 1. 5.0 cm aneurysmal dilation of the ascending thoracic aorta. Continue semi-annual imaging followup by CTA or MRA and referral to cardiothoracic surgery if not already obtained. This recommendation follows 2010 ACCF/AHA/AATS/ACR/ASA/SCA/SCAI/SIR/STS/SVM Guidelines for the Diagnosis and Management of Patients With Thoracic Aortic Disease. Circulation. 2010; 121: E266-e369TAA. Aortic aneurysm NOS (ICD10-I71.9) 2. Severe burden of multivessel coronary atherosclerosis. 3. Mild edematous lung change.  Emphysema (ICD10-J43.9). Additional incidental, chronic and senescent findings as above.    Recent Lab Findings: Lab Results  Component Value Date   WBC 4.6 02/03/2022   HGB 11.6 (L) 02/03/2022   HCT 35.2 (L) 02/03/2022   PLT 310 02/03/2022   GLUCOSE 107 (H) 08/18/2022   CHOL 212 (H) 08/18/2022   TRIG 85 08/18/2022   HDL 56 08/18/2022   LDLCALC 141 (H) 08/18/2022   ALT 8 08/18/2022   AST 14 08/18/2022   NA 138 08/18/2022   K 4.4 08/18/2022   CL 100 08/18/2022   CREATININE 1.14 08/18/2022   BUN 10 08/18/2022   CO2 22 08/18/2022   TSH 1.327 ***Test methodology is 3rd generation TSH*** 01/20/2009   INR 1.0 05/01/2020      Assessment / Plan:        I have spent *** min in review of the records, viewing studies and in face to face with patient and in coordination of future care    Coralie Common 10/03/2022 11:39 AM

## 2022-10-04 ENCOUNTER — Encounter: Payer: Self-pay | Admitting: Thoracic Surgery (Cardiothoracic Vascular Surgery)

## 2022-10-04 ENCOUNTER — Encounter: Payer: Medicare HMO | Admitting: Thoracic Surgery (Cardiothoracic Vascular Surgery)

## 2022-10-04 ENCOUNTER — Telehealth: Payer: Self-pay | Admitting: Orthopaedic Surgery

## 2022-10-04 ENCOUNTER — Institutional Professional Consult (permissible substitution) (INDEPENDENT_AMBULATORY_CARE_PROVIDER_SITE_OTHER): Payer: Medicare PPO | Admitting: Thoracic Surgery (Cardiothoracic Vascular Surgery)

## 2022-10-04 VITALS — BP 130/66 | HR 65 | Resp 20 | Ht 79.0 in | Wt 193.0 lb

## 2022-10-04 DIAGNOSIS — I7121 Aneurysm of the ascending aorta, without rupture: Secondary | ICD-10-CM

## 2022-10-04 NOTE — Patient Instructions (Signed)
Follow up with [place] if repeat CT has defined increase in aneurysm closer to 5.5 cm Repeat CT in 2-3 years

## 2022-10-08 ENCOUNTER — Ambulatory Visit: Payer: Self-pay | Admitting: *Deleted

## 2022-10-08 ENCOUNTER — Encounter: Payer: Self-pay | Admitting: *Deleted

## 2022-10-08 NOTE — Patient Outreach (Signed)
  Care Coordination   10/08/2022 Name: Larry Reese MRN: [id number] DOB: December 01, 1945   Care Coordination Outreach Attempts:  An unsuccessful telephone outreach was attempted for a scheduled appointment today.  Follow Up Plan:  Additional outreach attempts will be made to offer the patient care coordination information and services.   Encounter Outcome:  No Answer   Care Coordination Interventions:  No, not indicated    Chong Sicilian, BSN, RN-BC RN Care Coordinator [place]: [phone] Main #: [phone]

## 2022-10-08 NOTE — Patient Outreach (Signed)
  Care Coordination   Follow Up Visit Note   10/08/2022 Name: Larry Reese MRN: [id number] DOB: [birth year]  Larry Reese is a 77 y.o. year old male who sees [name], [name], MD for primary care. I spoke with  Larry Reese by phone today.  What matters to the patients health and wellness today? Manage AAA and hip pain    Goals Addressed               This Visit's Progress     Patient Stated     Chronic pain control (pt-stated)        Care Coordination Interventions: Reviewed provider established plan for pain management Discussed importance of adherence to all scheduled medical appointments Counseled on the importance of reporting any/all new or changed pain symptoms or management strategies to pain management provider Advised patient to report to care team affect of pain on daily activities Assessed social determinant of health barriers Reviewed upcoming appt with ortho, Dr Erlinda Hong, on 10/14/22 Provided with RN Care Coordinator telephone number and encouraged to reach out as needed       Other     COMPLETED: Aortic Aneurysm        Care Coordination Interventions: Reviewed results of CT angiogram and recommednation for cardiothoracic surgeon due to size enlargmenet from 49m to 540mReviewed recent office note from 10/04/22 by Dr WeLavonna Monarchcardiothoracic surgeon. His interpretation and recommendation: I have personally reviewed the pt's ct scan from 2018 and compared to the recent one and feel that there has not been an increase with both having size of 4883mI have reviewed this with the patient and he understands issues. I feel that with time frame of stability that repeat CT in [id number] be appropriate Reminded patient of recommended f/u Reminded patient to avoid heavy lifting and manage blood pressure to decrease risk of worsening AAA Provided with RNCCDakota Surgery And Laser Center LLCephone number and encouraged to reach out as needed Follow-up scheduled in month  Goal complete         SDOH assessments and interventions completed:  Yes  SDOH Interventions Today    Flowsheet Row Most Recent Value  SDOH Interventions   Transportation Interventions Intervention Not Indicated  Financial Strain Interventions Intervention Not Indicated        Care Coordination Interventions:  Yes, provided   Follow up plan: Follow up call scheduled for 11/10/22    Encounter Outcome:  Pt. Visit Completed   KrisChong SicilianN, RN-BC RN Care Coordinator [phone] #: [phone]

## 2022-10-12 ENCOUNTER — Inpatient Hospital Stay: Payer: Medicare HMO | Admitting: Internal Medicine

## 2022-10-14 ENCOUNTER — Encounter: Payer: Self-pay | Admitting: Orthopaedic Surgery

## 2022-10-14 ENCOUNTER — Ambulatory Visit (INDEPENDENT_AMBULATORY_CARE_PROVIDER_SITE_OTHER): Payer: Medicare PPO

## 2022-10-14 ENCOUNTER — Ambulatory Visit (INDEPENDENT_AMBULATORY_CARE_PROVIDER_SITE_OTHER): Payer: Medicare PPO | Admitting: Sports Medicine

## 2022-10-14 ENCOUNTER — Ambulatory Visit: Payer: Self-pay

## 2022-10-14 ENCOUNTER — Ambulatory Visit: Payer: Medicare PPO | Admitting: Orthopaedic Surgery

## 2022-10-14 DIAGNOSIS — M25552 Pain in left hip: Secondary | ICD-10-CM

## 2022-10-14 DIAGNOSIS — Z96641 Presence of right artificial hip joint: Secondary | ICD-10-CM

## 2022-10-14 MED ORDER — METHYLPREDNISOLONE ACETATE 40 MG/ML IJ SUSP
80.0000 mg | INTRAMUSCULAR | Status: AC | PRN
  Administered 2022-10-14: 80 mg via INTRA_ARTICULAR

## 2022-10-14 MED ORDER — LIDOCAINE HCL 1 % IJ SOLN
4.0000 mL | INTRAMUSCULAR | Status: AC | PRN
  Administered 2022-10-14: 4 mL

## 2022-10-14 NOTE — Progress Notes (Signed)
   Procedure Note  Patient: Larry Reese             Date of Birth: [birth year]           MRN: [id number]             Visit Date: 10/14/2022  Procedures: Visit Diagnoses:  1. Pain in left hip    Large Joint Inj: L hip joint on 10/14/2022 11:40 AM Indications: pain Details: 22 G 3.5 in needle, ultrasound-guided anterior approach Medications: 4 mL lidocaine 1 %; 80 mg methylPREDNISolone acetate 40 MG/ML Outcome: tolerated well, no immediate complications  Procedure: US-guided intra-articular hip injection, left After discussion on risks/benefits/indications and informed verbal consent was obtained, a timeout was performed. Patient was lying supine on exam table. The hip was cleaned with betadine and alcohol swabs. Then utilizing ultrasound guidance, the patient's femoral head and neck junction was identified and subsequently injected with 4:2 lidocaine:depomedrol via an in-plane approach with ultrasound visualization of the injectate administered into the hip joint. Patient tolerated procedure well without immediate complications.  Procedure, treatment alternatives, risks and benefits explained, specific risks discussed. Consent was given by the patient. Immediately prior to procedure a time out was called to verify the correct patient, procedure, equipment, support staff and site/side marked as required. Patient was prepped and draped in the usual sterile fashion.     - I evaluated the patient about 10 minutes post-injection and he had very good improvement in pain and range of motion - follow-up with Dr. Erlinda Hong as indicated; I am happy to see them as needed. I did show Dr. Erlinda Hong his ultrasound which showed findings consistent with possible loose body vs. Large anterior spur  Larry Barman, DO [place]  This note was dictated using Dragon naturally speaking software and may contain errors in syntax, spelling, or content which  have not been identified prior to signing this note.

## 2022-10-14 NOTE — Progress Notes (Signed)
Post-Op Visit Note   Patient: Larry Reese           Date of Birth: [birth year]           MRN: [id number] Visit Date: 10/14/2022 PCP: Carrolyn Meiers, MD   Assessment & Plan:  Chief Complaint:  Chief Complaint  Patient presents with   Right Hip - Follow-up    Right total hip arthroplasty 10/19/2021   Visit Diagnoses:  1. History of hip replacement, total, right   2. Pain in left hip     Plan: Patient is a pleasant 77 year old gentleman who comes in today 1 year status post right total hip replacement 10/19/2021.  Doing fairly well with the exception of lateral soreness.  He is also complaining of significant soreness to the left lateral hip as well.  He notes stiffness in both hips when he wakes up in the morning and takes the first few steps.  Right hip exam reveals painless hip flexion and logroll.  No send mild tenderness lateral hip.  Left hip exam reveals pain to the lateral hip with range of motion of the hip joint. At this point, it seems as though most of his symptoms are coming from the left hip osteoarthritis.  We would like to make referral to Dr. Rolena Infante for ultrasound-guided cortisone injection.  He will follow-up with [place] as needed.  Dental prophylaxis reinforced for another year.  Follow-up next year for repeat evaluation and AP pelvis x-rays. Follow-Up Instructions: Return in about 1 year (around 10/15/2023).   Orders:  Orders Placed This Encounter  Procedures   XR Pelvis 1-2 Views   No orders of the defined types were placed in this encounter.   Imaging: XR Pelvis 1-2 Views  Result Date: 10/14/2022 X-rays demonstrate a well-seated prosthesis on the right.  He does have significant heterotopic ossification.   PMFS History: Patient Active Problem List   Diagnosis Date Noted   Status post total replacement of right hip 10/19/2021   Paroxysmal atrial fibrillation ([place]) 09/04/2020   Typical atrial flutter ([place]) 08/20/2020   Secondary hypercoagulable  state ([place]) 08/20/2020   History of subdural hematoma 06/12/2020   PAD (peripheral artery disease) ([place]) 05/27/2020   Essential hypertension 05/27/2020   Atrial fibrillation ([place]) 05/27/2020   Chronic toe pain, right foot 02/20/2019   Hematoma of left hip 02/20/2019   Avascular necrosis of bone of right hip ([place]) 01/23/2019   Pain in left hip 01/23/2019   Rib pain on left side 01/23/2019   History of colonic polyps    Hepatitis, alcoholic 78/29/5621   ETOH abuse 02/10/2015   Internal hemorrhoids 07/17/2009   Colon adenomas 07/15/2009   Past Medical History:  Diagnosis Date   A-fib ([place])    Anemia    Arthritis    Bradycardia    Dilatation of thoracic aorta (HCC)    dilated aortic root and ascending aorta   Dupuytren's contracture of right hand    Hx of adenomatous colonic polyps    Hypertension    Pneumothorax 09/18/1998   spontaneous right PTX 09/18/98, s/p chest tube   SDH (subdural hematoma) ([place]) 10/27/2004   left chronic SDH versus hygroma 10/27/04 CT   Stroke [facility]) 2005   left sided weakness   Trigger finger     Family History  Problem Relation Age of Onset   Esophageal cancer Father    Colon cancer Neg Hx     Past Surgical History:  Procedure Laterality Date   COLONOSCOPY  May 2010   Dr. fields: 5 cm terminal ileum normal, 6 polyps removed, moderate internal hemorrhoids, simple adenomas, next colonoscopy May 2015   COLONOSCOPY N/A 08/01/2015   Procedure: COLONOSCOPY;  Surgeon: Danie Binder, MD;  [facility];  Service: Endoscopy;  Laterality: N/A;  0830   CRANIOTOMY  2000   pressure   INGUINAL HERNIA REPAIR Right    INGUINAL HERNIA REPAIR Left 02/09/2022   Procedure: OPEN LEFT INGUINAL HERNIA REPAIR;  Surgeon: Ralene Ok, MD;  Location: [place];  Service: General;  Laterality: Left;   INSERTION OF MESH Left 02/09/2022   Procedure: INSERTION OF MESH;  Surgeon: Ralene Ok, MD;  Location: [place];  Service: General;  Laterality: Left;   LEFT  ATRIAL APPENDAGE OCCLUSION N/A 09/04/2020   Procedure: LEFT ATRIAL APPENDAGE OCCLUSION;  Surgeon: Vickie Epley, MD;  Location: [place] CV LAB;  Service: Cardiovascular;  Laterality: N/A;   OLECRANON BURSECTOMY Right 02/11/2015   Procedure: EXCISION RIGHT OLECRANON BURSA;  Surgeon: Sanjuana Kava, MD;  Location: [facility] ORS;  Service: Orthopedics;  Laterality: Right;   repair of trigger finger Left    pinky   TEE WITHOUT CARDIOVERSION N/A 09/04/2020   Procedure: TRANSESOPHAGEAL ECHOCARDIOGRAM (TEE);  Surgeon: Vickie Epley, MD;  Location: [place] CV LAB;  Service: Cardiovascular;  Laterality: N/A;   TEE WITHOUT CARDIOVERSION N/A 10/13/2020   Procedure: TRANSESOPHAGEAL ECHOCARDIOGRAM (TEE);  Surgeon: Geralynn Rile, MD;  Location: [place];  Service: Cardiovascular;  Laterality: N/A;   TOTAL HIP ARTHROPLASTY Right 10/19/2021   Procedure: RIGHT TOTAL HIP ARTHROPLASTY ANTERIOR APPROACH;  Surgeon: Leandrew Koyanagi, MD;  Location: [place];  Service: Orthopedics;  Laterality: Right;  3-C   Social History   Occupational History   Occupation: retired  Tobacco Use   Smoking status: Every Day    Packs/day: 0.25    Years: 50.00    Total pack years: 12.50    Types: Cigarettes   Smokeless tobacco: Never   Tobacco comments:    5-6 cigarettes per day  Vaping Use   Vaping Use: Never used  Substance and Sexual Activity   Alcohol use: Not Currently   Drug use: No   Sexual activity: Yes    Birth control/protection: None

## 2022-10-15 ENCOUNTER — Ambulatory Visit: Payer: Medicare HMO | Admitting: Orthopaedic Surgery

## 2022-10-19 ENCOUNTER — Telehealth (HOSPITAL_BASED_OUTPATIENT_CLINIC_OR_DEPARTMENT_OTHER): Payer: Self-pay

## 2022-10-19 MED ORDER — ROSUVASTATIN CALCIUM 10 MG PO TABS: 10.0000 mg | ORAL_TABLET | Freq: Every day | ORAL | 3 refills | Status: DC

## 2022-10-19 NOTE — Telephone Encounter (Signed)
Received fax from [facility] requesting refills for Rosuvastatin 10 mg. Rx request sent to pharmacy.

## 2022-10-22 ENCOUNTER — Other Ambulatory Visit: Payer: Self-pay

## 2022-10-22 MED ORDER — ROSUVASTATIN CALCIUM 10 MG PO TABS
10.0000 mg | ORAL_TABLET | Freq: Every day | ORAL | 3 refills | Status: DC
Start: 2022-10-22 — End: 2023-09-13

## 2022-10-26 ENCOUNTER — Telehealth: Payer: Self-pay | Admitting: Orthopaedic Surgery

## 2022-10-26 NOTE — Telephone Encounter (Signed)
Patient is having Oral surgery soon and will need antibiotic patient had THA 1 year ago please send Rx to pharmacy on file dental office is [facility] patient is having Consultation Thursday then finding out surgery date after

## 2022-10-27 ENCOUNTER — Other Ambulatory Visit: Payer: Self-pay | Admitting: Physician Assistant

## 2022-10-27 MED ORDER — AMOXICILLIN 500 MG PO CAPS
ORAL_CAPSULE | ORAL | 2 refills | Status: AC
Start: 2022-10-27 — End: ?

## 2022-10-27 NOTE — Telephone Encounter (Signed)
sent

## 2022-10-27 NOTE — Telephone Encounter (Signed)
Notified patient.

## 2022-10-29 ENCOUNTER — Telehealth: Payer: Self-pay | Admitting: *Deleted

## 2022-10-29 NOTE — Progress Notes (Signed)
  Care Coordination Note  10/29/2022 Name: Larry Reese MRN: [id number] DOB: 1946/08/20  Larry Reese is a 77 y.o. year old male who is a primary care patient of Legrand Rams, [name], MD and is actively engaged with the care management team. I reached out to [name] by phone today to assist with re-scheduling a follow up visit with the RN Case Manager  Follow up plan: Unsuccessful telephone outreach attempt made. A HIPAA compliant phone message was left for the patient providing contact information and requesting a return call.   [place]  Direct Dial: [phone]

## 2022-11-01 DIAGNOSIS — I10 Essential (primary) hypertension: Secondary | ICD-10-CM | POA: Diagnosis not present

## 2022-11-01 DIAGNOSIS — I4821 Permanent atrial fibrillation: Secondary | ICD-10-CM | POA: Diagnosis not present

## 2022-11-01 NOTE — Progress Notes (Signed)
  Care Coordination Note  11/01/2022 Name: Larry Reese MRN: [id number] DOB: 1946-09-20  Larry Reese is a 77 y.o. year old male who is a primary care patient of Legrand Rams, [name], MD and is actively engaged with the care management team. I reached out to [name] by phone today to assist with re-scheduling a follow up visit with the RN Case Manager  Follow up plan: Unsuccessful telephone outreach attempt made. A HIPAA compliant phone message was left for the patient providing contact information and requesting a return call.   [place]  Direct Dial: [phone]

## 2022-11-02 ENCOUNTER — Telehealth: Payer: Self-pay | Admitting: Orthopaedic Surgery

## 2022-11-02 NOTE — Telephone Encounter (Signed)
Called and spoke w/the patient and let him know that his form is ready to be picked up.

## 2022-11-04 ENCOUNTER — Other Ambulatory Visit: Payer: Self-pay | Admitting: Orthopaedic Surgery

## 2022-11-08 ENCOUNTER — Telehealth: Payer: Self-pay

## 2022-11-08 MED ORDER — HYDROCODONE-ACETAMINOPHEN 7.5-325 MG PO TABS: ORAL_TABLET | ORAL | 0 refills | Status: DC

## 2022-11-08 NOTE — Progress Notes (Signed)
  Care Coordination Note  11/08/2022 Name: Larry Reese MRN: [id number] DOB: 1946/04/25  Larry Reese is a 77 y.o. year old male who is a primary care patient of Legrand Rams, [name], MD and is actively engaged with the care management team. I reached out to [name] by phone today to assist with re-scheduling a follow up visit with the RN Case Manager  Follow up plan: Telephone appointment with care management team member scheduled for:11/19/22  [place]: [phone]

## 2022-11-08 NOTE — Telephone Encounter (Signed)
Hydrocodone-Acetaminophen 7.5/325 MG  Qty 60 Tablets  PATIENT USES [facility]

## 2022-11-10 ENCOUNTER — Encounter: Payer: Medicare PPO | Admitting: *Deleted

## 2022-11-19 ENCOUNTER — Ambulatory Visit: Payer: Self-pay | Admitting: *Deleted

## 2022-11-19 NOTE — Patient Outreach (Signed)
  Care Coordination   11/19/2022 Name: Larry Reese MRN: [id number] DOB: 1946-05-28   Care Coordination Outreach Attempts:  An unsuccessful telephone outreach was attempted for a scheduled appointment today.  Follow Up Plan:  Additional outreach attempts will be made to offer the patient care coordination information and services.   Encounter Outcome:  No Answer   Care Coordination Interventions:  No, not indicated    Chong Sicilian, BSN, RN-BC RN Care Coordinator [place]: [phone] Main #: [phone]

## 2022-12-01 ENCOUNTER — Ambulatory Visit (INDEPENDENT_AMBULATORY_CARE_PROVIDER_SITE_OTHER): Payer: Medicare PPO | Admitting: Orthopaedic Surgery

## 2022-12-01 ENCOUNTER — Encounter: Payer: Self-pay | Admitting: Orthopaedic Surgery

## 2022-12-01 VITALS — BP 156/80 | HR 61 | Ht 78.5 in | Wt 201.0 lb

## 2022-12-01 DIAGNOSIS — M25512 Pain in left shoulder: Secondary | ICD-10-CM | POA: Diagnosis not present

## 2022-12-01 DIAGNOSIS — G8929 Other chronic pain: Secondary | ICD-10-CM

## 2022-12-01 DIAGNOSIS — F17210 Nicotine dependence, cigarettes, uncomplicated: Secondary | ICD-10-CM

## 2022-12-01 NOTE — Progress Notes (Signed)
My shoulder is tender.  His left shoulder is sore at times.  He has no new trauma.  ROM of the left shoulder is forward 145, abduction 120, internal 25, external 25, extension 10, adduction full.  NV intact.  Encounter Diagnoses  Name Primary?   Chronic left shoulder pain Yes   Cigarette nicotine dependence without complication    Return in three months.  Call if any problem.  Precautions discussed.  Electronically Signed Sanjuana Kava, MD 3/13/20249:58 AM

## 2022-12-02 ENCOUNTER — Ambulatory Visit: Payer: Self-pay | Admitting: *Deleted

## 2022-12-02 ENCOUNTER — Encounter: Payer: Self-pay | Admitting: *Deleted

## 2022-12-02 NOTE — Patient Outreach (Signed)
  Care Coordination   Follow Up Visit Note   12/02/2022 Name: Larry Reese MRN: [id number] DOB: 01/27/1946  Larry Reese is a 77 y.o. year old male who sees [name], [name], MD for primary care. I spoke with  [name] by phone today.  What matters to the patients health and wellness today?  Chronic shoulder pain and managing medications    Goals Addressed               This Visit's Progress     Patient Stated     Chronic pain control (pt-stated)        Care Coordination Goals: Follow-up with PCP and Dr [name] (Ortho) as recommended Reach out to provider with any new or worsening symptoms  Take medication as prescribed Talk with provider about alternative therapies and PT for chronic left shoulder pain Reach out to RN Care Coordinator with any care coordination or resource needs [phone]       Other     Manage Medications        Care Coordination Goals: Take medications as prescribed Call [name] at [phone] to order Crestor  Talk with a representative to request refill that should be on file from [name] on 10/22/22 because current bottle and Rx number do not have any remaining refills Call RN Care Coordinator with any care coordination or resource needs [phone]         SDOH assessments and interventions completed:  Yes  SDOH Interventions Today    Flowsheet Row Most Recent Value  SDOH Interventions   Transportation Interventions Intervention Not Indicated  Financial Strain Interventions Intervention Not Indicated        Care Coordination Interventions:  Yes, provided  Interventions Today    Flowsheet Row Most Recent Value  Chronic Disease   Chronic disease during today's visit Other  [Hyperlipidemia, chronic shoulder pain]  General Interventions   General Interventions Discussed/Reviewed General Interventions Discussed, General Interventions Reviewed, Lipid Profile, Doctor Visits  Labs --   [lipids yearly]  Doctor Visits Discussed/Reviewed Doctor Visits Discussed, Doctor Visits Reviewed, Specialist, PCP  PCP/Specialist Visits Compliance with follow-up visit  Exercise Interventions   Exercise Discussed/Reviewed Physical Activity  Physical Activity Discussed/Reviewed Physical Activity Discussed  [some activities limited by shoulder pain]  Education Interventions   Education Provided Provided Education  Provided Verbal Education On Labs, Exercise, Medication, When to see the doctor  Labs Reviewed Lipid Profile  Nutrition Interventions   Nutrition Discussed/Reviewed Nutrition Reviewed  Pharmacy Interventions   Pharmacy Dicussed/Reviewed Medications and their functions       Follow up plan: Follow up call scheduled for 01/31/23    Encounter Outcome:  Pt. Visit Completed   Chong Sicilian, BSN, RN-BC RN Care Coordinator [place] Direct Dial: [phone] Main #: [phone]

## 2022-12-03 DIAGNOSIS — M2041 Other hammer toe(s) (acquired), right foot: Secondary | ICD-10-CM | POA: Diagnosis not present

## 2022-12-03 DIAGNOSIS — L84 Corns and callosities: Secondary | ICD-10-CM | POA: Diagnosis not present

## 2022-12-03 DIAGNOSIS — L603 Nail dystrophy: Secondary | ICD-10-CM | POA: Diagnosis not present

## 2022-12-03 DIAGNOSIS — B351 Tinea unguium: Secondary | ICD-10-CM | POA: Diagnosis not present

## 2022-12-03 DIAGNOSIS — I739 Peripheral vascular disease, unspecified: Secondary | ICD-10-CM | POA: Diagnosis not present

## 2022-12-03 DIAGNOSIS — M792 Neuralgia and neuritis, unspecified: Secondary | ICD-10-CM | POA: Diagnosis not present

## 2022-12-07 ENCOUNTER — Telehealth: Payer: Self-pay | Admitting: Orthopaedic Surgery

## 2022-12-15 DIAGNOSIS — M1990 Unspecified osteoarthritis, unspecified site: Secondary | ICD-10-CM | POA: Diagnosis not present

## 2022-12-15 DIAGNOSIS — I739 Peripheral vascular disease, unspecified: Secondary | ICD-10-CM | POA: Diagnosis not present

## 2022-12-15 DIAGNOSIS — N400 Enlarged prostate without lower urinary tract symptoms: Secondary | ICD-10-CM | POA: Diagnosis not present

## 2022-12-15 DIAGNOSIS — I10 Essential (primary) hypertension: Secondary | ICD-10-CM | POA: Diagnosis not present

## 2022-12-15 DIAGNOSIS — Z1331 Encounter for screening for depression: Secondary | ICD-10-CM | POA: Diagnosis not present

## 2022-12-15 DIAGNOSIS — Z1389 Encounter for screening for other disorder: Secondary | ICD-10-CM | POA: Diagnosis not present

## 2022-12-15 DIAGNOSIS — Z0001 Encounter for general adult medical examination with abnormal findings: Secondary | ICD-10-CM | POA: Diagnosis not present

## 2022-12-15 DIAGNOSIS — F17200 Nicotine dependence, unspecified, uncomplicated: Secondary | ICD-10-CM | POA: Diagnosis not present

## 2022-12-15 DIAGNOSIS — Z23 Encounter for immunization: Secondary | ICD-10-CM | POA: Diagnosis not present

## 2022-12-21 ENCOUNTER — Telehealth: Payer: Self-pay | Admitting: Radiology

## 2022-12-21 NOTE — Telephone Encounter (Signed)
Patient's wife called asking about the form and the $25 refund from forms to [place].  I completed what was needed, brought form and money back to [place] office.  I called Mr Walther and advised he could pickup in [place].  Unfortunately I did not document in his chart so I am unsure when this was.  I called today NA, wanted to reach out make sure they got what the needed.  Patient's wife came into the [place] office and spoke with Manuela Schwartz 12/03/22.  Will f/u with them.

## 2023-01-03 DIAGNOSIS — H524 Presbyopia: Secondary | ICD-10-CM | POA: Diagnosis not present

## 2023-01-03 DIAGNOSIS — H2513 Age-related nuclear cataract, bilateral: Secondary | ICD-10-CM | POA: Diagnosis not present

## 2023-01-06 ENCOUNTER — Telehealth: Payer: Self-pay | Admitting: Orthopaedic Surgery

## 2023-01-15 DIAGNOSIS — I10 Essential (primary) hypertension: Secondary | ICD-10-CM | POA: Diagnosis not present

## 2023-01-15 DIAGNOSIS — I4821 Permanent atrial fibrillation: Secondary | ICD-10-CM | POA: Diagnosis not present

## 2023-01-24 ENCOUNTER — Ambulatory Visit (HOSPITAL_BASED_OUTPATIENT_CLINIC_OR_DEPARTMENT_OTHER): Payer: Medicare PPO | Admitting: Cardiology

## 2023-01-24 ENCOUNTER — Encounter (HOSPITAL_BASED_OUTPATIENT_CLINIC_OR_DEPARTMENT_OTHER): Payer: Self-pay | Admitting: Cardiology

## 2023-01-24 VITALS — BP 106/67 | HR 53 | Ht 78.5 in | Wt 200.1 lb

## 2023-01-24 DIAGNOSIS — D6869 Other thrombophilia: Secondary | ICD-10-CM

## 2023-01-24 DIAGNOSIS — Z8679 Personal history of other diseases of the circulatory system: Secondary | ICD-10-CM

## 2023-01-24 DIAGNOSIS — I4821 Permanent atrial fibrillation: Secondary | ICD-10-CM | POA: Diagnosis not present

## 2023-01-24 DIAGNOSIS — Z95818 Presence of other cardiac implants and grafts: Secondary | ICD-10-CM | POA: Diagnosis not present

## 2023-01-24 DIAGNOSIS — R001 Bradycardia, unspecified: Secondary | ICD-10-CM

## 2023-01-24 DIAGNOSIS — I7121 Aneurysm of the ascending aorta, without rupture: Secondary | ICD-10-CM | POA: Diagnosis not present

## 2023-01-24 NOTE — Patient Instructions (Signed)
Medication Instructions:  Your physician recommends that you continue on your current medications as directed. Please refer to the Current Medication list given to you today.   *If you need a refill on your cardiac medications before your next appointment, please call your pharmacy*  Lab Work: NONE  Testing/Procedures: NONE   Follow-Up: At [facility], you and your health needs are our priority.  As part of our continuing mission to provide you with exceptional heart care, we have created designated Provider Care Teams.  These Care Teams include your primary Cardiologist (physician) and Advanced Practice Providers (APPs -  Physician Assistants and Nurse Practitioners) who all work together to provide you with the care you need, when you need it.  We recommend signing up for the patient portal called "MyChart".  Sign up information is provided on this After Visit Summary.  MyChart is used to connect with patients for Virtual Visits (Telemedicine).  Patients are able to view lab/test results, encounter notes, upcoming appointments, etc.  Non-urgent messages can be sent to your provider as well.   To learn more about what you can do with MyChart, go to [email or web address].    Your next appointment:   6 month(s)  The format for your next appointment:   In Person  Provider:   Bridgette Christopher, MD             

## 2023-01-24 NOTE — Progress Notes (Signed)
Cardiology Office Note:    Date:  01/24/2023   ID:  Larry Reese, DOB 07/27/1946, MRN [id number]  PCP:  Benetta Spar, MD  Cardiologist:  Jodelle Red, MD  Referring MD: Benetta Spar*   CC: follow up  History of Present Illness:    Larry Reese is a 77 y.o. male with a hx of permanent atrial fibrillation, tobacco use, hypertension, subdural hematoma 2006 who is seen for follow up today. He was initially seen 05/07/20 as a new consult at the request of Larry Reese, Larry Reese* for the evaluation and management of atrial fibrillation, preoperative evaluation.  Cardiac history: At preop visit 05/01/20, noted to be in new atrial fibrillation on ECG. Referred to cardiology for evaluation prior to hip surgery. History reported stroke in 2005, but this actually appears to be a subdural hematoma in 2006. Had Watchman procedure 09/04/20.  Today: Hip remains major concern. No heart concerns. No bleeding issues. In afib today, bradycardic, same as prior. No symptoms.  Had CT with dilated ascending aorta, saw Dr. Leafy Ro. Recommended to repeat CT in 2-3 years based on stability.  Denies chest pain, shortness of breath at rest or with normal exertion. No PND, orthopnea, LE edema or unexpected weight gain. No syncope or palpitations.    Past Medical History:  Diagnosis Date   A-fib [facility])    Anemia    Arthritis    Bradycardia    Dilatation of thoracic aorta (HCC)    dilated aortic root and ascending aorta   Dupuytren's contracture of right hand    Hx of adenomatous colonic polyps    Hypertension    Pneumothorax 09/18/1998   spontaneous right PTX 09/18/98, s/p chest tube   SDH (subdural hematoma) (HCC) 10/27/2004   left chronic SDH versus hygroma 10/27/04 CT   Stroke [facility]) 2005   left sided weakness   Trigger finger     Past Surgical History:  Procedure Laterality Date   COLONOSCOPY  May 2010   Dr. fields: 5 cm terminal ileum normal, 6 polyps  removed, moderate internal hemorrhoids, simple adenomas, next colonoscopy May 2015   COLONOSCOPY N/A 08/01/2015   Procedure: COLONOSCOPY;  Surgeon: West Bali, MD;  Location: [facility] ENDO SUITE;  Service: Endoscopy;  Laterality: N/A;  0830   CRANIOTOMY  2000   pressure   INGUINAL HERNIA REPAIR Right    INGUINAL HERNIA REPAIR Left 02/09/2022   Procedure: OPEN LEFT INGUINAL HERNIA REPAIR;  Surgeon: Axel Filler, MD;  Location: [facility] OR;  Service: General;  Laterality: Left;   INSERTION OF MESH Left 02/09/2022   Procedure: INSERTION OF MESH;  Surgeon: Axel Filler, MD;  Location: [facility] OR;  Service: General;  Laterality: Left;   LEFT ATRIAL APPENDAGE OCCLUSION N/A 09/04/2020   Procedure: LEFT ATRIAL APPENDAGE OCCLUSION;  Surgeon: Lanier Prude, MD;  Location: [facility] INVASIVE CV LAB;  Service: Cardiovascular;  Laterality: N/A;   OLECRANON BURSECTOMY Right 02/11/2015   Procedure: EXCISION RIGHT OLECRANON BURSA;  Surgeon: Darreld Mclean, MD;  Location: [facility] ORS;  Service: Orthopedics;  Laterality: Right;   repair of trigger finger Left    pinky   TEE WITHOUT CARDIOVERSION N/A 09/04/2020   Procedure: TRANSESOPHAGEAL ECHOCARDIOGRAM (TEE);  Surgeon: Lanier Prude, MD;  Location: [facility] INVASIVE CV LAB;  Service: Cardiovascular;  Laterality: N/A;   TEE WITHOUT CARDIOVERSION N/A 10/13/2020   Procedure: TRANSESOPHAGEAL ECHOCARDIOGRAM (TEE);  Surgeon: Sande Rives, MD;  Location: [facility] ENDOSCOPY;  Service: Cardiovascular;  Laterality: N/A;   TOTAL HIP ARTHROPLASTY  Right 10/19/2021   Procedure: RIGHT TOTAL HIP ARTHROPLASTY ANTERIOR APPROACH;  Surgeon: Tarry Kos, MD;  Location: [facility] OR;  Service: Orthopedics;  Laterality: Right;  3-C    Current Medications: Current Outpatient Medications on File Prior to Visit  Medication Sig   acetaminophen (TYLENOL) 500 MG tablet Take 500-1,000 mg by mouth every 6 (six) hours as needed (for pain.).   amLODipine (NORVASC) 10 MG tablet Take 10 mg by mouth in the  morning.   amoxicillin (AMOXIL) 500 MG capsule Take 4 pills one hour prior to dental work   aspirin EC 81 MG tablet Take 1 tablet (81 mg total) by mouth 2 (two) times daily. To be taken after surgery to prevent blood clots   HYDROcodone-acetaminophen (NORCO) 7.5-325 MG tablet TAKE ONE (1) TABLET BY MOUTH EVERY 6 HOURS AS NEEDED FOR MODERATE PAIN   Lidocaine-Menthol 4-1 % LIQD Apply 1 application. topically 3 (three) times daily as needed (pain.). NERVIVE PAIN RELIEVING Liquid Roll-on   rosuvastatin (CRESTOR) 10 MG tablet Take 1 tablet (10 mg total) by mouth daily.   tamsulosin (FLOMAX) 0.4 MG CAPS capsule Take 0.4 mg by mouth daily.    traZODone (DESYREL) 100 MG tablet Take 100 mg by mouth at bedtime as needed for sleep.   vitamin E 180 MG (400 UNITS) capsule Take 400 Units by mouth 2 (two) times a week.   No current facility-administered medications on file prior to visit.     Allergies:   Aleve [naproxen], Celebrex [celecoxib], and Fluoxetine hcl   Social History   Tobacco Use   Smoking status: Every Day    Packs/day: 0.25    Years: 50.00    Additional pack years: 0.00    Total pack years: 12.50    Types: Cigarettes   Smokeless tobacco: Never   Tobacco comments:    5-6 cigarettes per day  Vaping Use   Vaping Use: Never used  Substance Use Topics   Alcohol use: Not Currently   Drug use: No    Family History: family history includes Esophageal cancer in his father. There is no history of Colon cancer.  ROS:   Please see the history of present illness.   (+) Bilateral hip pain (+) Palpitations Additional pertinent ROS otherwise unremarkable.  EKGs/Labs/Other Studies Reviewed:    The following studies were reviewed today:  Non-Telemetry Monitoring 06/10/2021 Patch Wear Time:  14 days and 0 hours   Atrial Fibrillation occurred continuously (100% burden), ranging from 31-114 bpm (avg of 63 bpm). 1 Pause occurred lasting 3 secs (20 bpm) on 05/22/21 at 3:44 AM. Isolated VEs  were rare (<1.0%). There was 1 triggered event, which was atrial fibrillation.  ECHO 06/02/2021  1. Left ventricular ejection fraction, by estimation, is 60 to 65%. The  left ventricle has normal function. The left ventricle has no regional  wall motion abnormalities. Left ventricular diastolic function could not  be evaluated.   2. Right ventricular systolic function is normal. The right ventricular  size is normal. There is normal pulmonary artery systolic pressure. The  estimated right ventricular systolic pressure is 22.4 mmHg.   3. Left atrial size was mildly dilated.   4. The mitral valve is normal in structure. Trivial mitral valve  regurgitation. No evidence of mitral stenosis.   5. The aortic valve is tricuspid. Aortic valve regurgitation is not  visualized. No aortic stenosis is present.   6. Aortic dilatation noted. There is mild dilatation of the aortic root,  measuring 40 mm.  7. The inferior vena cava is normal in size with greater than 50%  respiratory variability, suggesting right atrial pressure of 3 mmHg.   Comparison(s): No significant change from prior study.  ECHO TEE 10/13/2020 1. A 31 mm Watchman FLX is present in the LAA. Max diameter 27.9 mm (10%  compression). No device related thrombus or device leak. Left atrial size  was mildly dilated. No left atrial/left atrial appendage thrombus was  detected.   2. Aorta measurements comparable to recent CT. Aneurysm of the ascending  aorta, measuring 48 mm. There is Severe (Grade IV) protruding plaque  involving the descending aorta.   3. Left ventricular ejection fraction, by estimation, is 60 to 65%. The  left ventricle has normal function. The left ventricle has no regional  wall motion abnormalities.   4. Right ventricular systolic function is normal. The right ventricular  size is normal.   5. The mitral valve is grossly normal. Mild mitral valve regurgitation.  No evidence of mitral stenosis.   6. The aortic  valve is tricuspid. Aortic valve regurgitation is not  visualized. No aortic stenosis is present.   Cardiac CT 07/21/2020 IMPRESSION: 1.  Moderate RAE, mild LAE with no LAA thrombus   2. Chicken wing morphology LAA suitable for a 27 mm Watchman FLX with 12.6% expected compression   3.  Normal PV anatomy with no anomaly   4.  Normal atrial septum with no PFO or lipomatous hypertrophy   5.  No pericardial effusion   6.  Optimum working angle to view LAA ostium RAO 31 Caudal 9 degrees   7. Suggest anterior curve sheath given anteriorly directed appendage along LV free wall   8. Moderate ascending aortic root dilatation 4.7 cm in orthogonal planes   9.  Suggest posterior mid fossa trans-septal puncture  L Atrial Appendage Occlusion 1216/2021 CONCLUSIONS:  1.Successful implantation of a WATCHMAN left atrial appendage occlusive device    2. TEE demonstrating no LAA thrombus 3. No early apparent complications.  4. Post procedural anticoagulation strategy will be Apixaban 2.5mg  PO BID and Aspirin 81mg  PO daily.  Echo 08/04/20 1. Left ventricular ejection fraction, by estimation, is 60 to 65%. The  left ventricle has normal function. The left ventricle has no regional  wall motion abnormalities. Left ventricular diastolic function could not  be evaluated.   2. Right ventricular systolic function is normal. The right ventricular  size is normal. There is normal pulmonary artery systolic pressure. The  estimated right ventricular systolic pressure is 34.1 mmHg.   3. Left atrial size was mildly dilated.   4. The mitral valve is normal in structure. Mild mitral valve  regurgitation. No evidence of mitral stenosis.   5. The aortic valve is normal in structure. Aortic valve regurgitation is  not visualized. No aortic stenosis is present.   6. Aortic dilatation noted. There is mild dilatation of the aortic root,  measuring 40 mm. There is moderate to severe dilatation of the ascending   aorta, measuring 50 mm.   7. The inferior vena cava is normal in size with greater than 50%  respiratory variability, suggesting right atrial pressure of 3 mmHg.   EKG:  EKG is personally reviewed.   01/24/23: atrial fibrillation at 53 bpm 07/01/21: not ordered today 05/21/21: atrial fibrillation with slow ventricular response at 54 bpm 05/08/20 demonstrates atrial fibrillation with slow ventricular response of 57 bpm.  Recent Labs: 02/03/2022: Hemoglobin 11.6; Platelets 310 08/18/2022: ALT 8; BUN 10; Creatinine, Ser 1.14; Potassium  4.4; Sodium 138  Recent Lipid Panel    Component Value Date/Time   CHOL 212 (H) 08/18/2022 1116   TRIG 85 08/18/2022 1116   HDL 56 08/18/2022 1116   CHOLHDL 3.8 08/18/2022 1116   LDLCALC 141 (H) 08/18/2022 1116    Physical Exam:    VS:  BP 106/67 (BP Location: Left Arm, Patient Position: Sitting, Cuff Size: Normal)   Pulse (!) 53   Ht 6' 6.5" (1.994 m)   Wt 200 lb 1.6 oz (90.8 kg)   BMI 22.83 kg/m     Wt Readings from Last 3 Encounters:  01/24/23 200 lb 1.6 oz (90.8 kg)  12/01/22 201 lb (91.2 kg)  10/04/22 193 lb (87.5 kg)    GEN: Well nourished, well developed in no acute distress HEENT: Normal, moist mucous membranes NECK: No JVD CARDIAC: irregularly irregular rhythm, normal S1 and S2, no rubs or gallops. No murmur. VASCULAR: Radial and DP pulses 2+ bilaterally. No carotid bruits RESPIRATORY:  Clear to auscultation without rales, wheezing or rhonchi  ABDOMEN: Soft, non-tender, non-distended MUSCULOSKELETAL:  Ambulates independently SKIN: Warm and dry, no edema NEUROLOGIC:  Alert and oriented x 3. No focal neuro deficits noted. PSYCHIATRIC:  Normal affect    ASSESSMENT:    1. Permanent atrial fibrillation (HCC)   2. Secondary hypercoagulable state (HCC)   3. Bradycardia   4. History of subdural hematoma   5. Presence of Watchman left atrial appendage closure device   6. Aneurysm of ascending aorta without rupture (HCC)     PLAN:     Bradycardia -asymptomatic -prior monitor with occasional dips into HR in the 30s, one 3 second pause, but good heart rate variability. In permanent afib  Atrial fibrillation, permanent CHA2DS2/VAS Stroke Risk Points =5 (age, hypertension, CVAx2, PAD) initially, though now appears to have been a subdural hematoma and not a stroke, placing his CHADSVASC=3 -s/p Watchman and doing well  Hypertension: at goal of <130/80 -continue amlodipine 10 mg daily  PAD: -continue aspirin -have discussed statin, tobacco cessation. Precontemplative, discussed again today, smoking about 1/2 ppd.  Hypercholesterolemia: -with PAD, goal LDL <70. Last LDL 141 -has declined statin in the past  Thoracic aortic aneurysm, ascending -5 cm 08/2022 -has seen Dr. Leafy Ro, plan for repeat CT in 203 years -reviewed weight lifting restrictions, BP management   Cardiac risk counseling and prevention recommendations: -recommend heart healthy/Mediterranean diet, with whole grains, fruits, vegetable, fish, lean meats, nuts, and olive oil. Limit salt. -recommend moderate walking, 3-5 times/week for 30-50 minutes each session. Aim for at least 150 minutes.week. Goal should be pace of 3 miles/hours, or walking 1.5 miles in 30 minutes -recommend avoidance of tobacco products. Avoid excess alcohol.  Plan for follow up: 6 months   Jodelle Red, MD, PhD [place]  [facility]   Medication Adjustments/Labs and Tests Ordered: Current medicines are reviewed at length with the patient today.  Concerns regarding medicines are outlined above.  Orders Placed This Encounter  Procedures   EKG 12-Lead   No orders of the defined types were placed in this encounter.  Patient Instructions  Medication Instructions:  Your physician recommends that you continue on your current medications as directed. Please refer to the Current Medication list given to you today.  *If you need a refill on your cardiac  medications before your next appointment, please call your pharmacy*  Lab Work: NONE  Testing/Procedures: NONE  Follow-Up: At [facility], you and your health needs are our priority.  As part of  our continuing mission to provide you with exceptional heart care, we have created designated Provider Care Teams.  These Care Teams include your primary Cardiologist (physician) and Advanced Practice Providers (APPs -  Physician Assistants and Nurse Practitioners) who all work together to provide you with the care you need, when you need it.  We recommend signing up for the patient portal called "MyChart".  Sign up information is provided on this After Visit Summary.  MyChart is used to connect with patients for Virtual Visits (Telemedicine).  Patients are able to view lab/test results, encounter notes, upcoming appointments, etc.  Non-urgent messages can be sent to your provider as well.   To learn more about what you can do with MyChart, go to [email or web address].    Your next appointment:   6 month(s)  The format for your next appointment:   In Person  Provider:   Jodelle Red, MD    Signed, Jodelle Red, MD PhD 01/24/2023   [facility]

## 2023-01-31 ENCOUNTER — Encounter: Payer: Medicare PPO | Admitting: *Deleted

## 2023-02-01 DIAGNOSIS — I4821 Permanent atrial fibrillation: Secondary | ICD-10-CM | POA: Diagnosis not present

## 2023-02-01 DIAGNOSIS — Z0001 Encounter for general adult medical examination with abnormal findings: Secondary | ICD-10-CM | POA: Diagnosis not present

## 2023-02-01 DIAGNOSIS — N400 Enlarged prostate without lower urinary tract symptoms: Secondary | ICD-10-CM | POA: Diagnosis not present

## 2023-02-01 DIAGNOSIS — I10 Essential (primary) hypertension: Secondary | ICD-10-CM | POA: Diagnosis not present

## 2023-02-07 ENCOUNTER — Telehealth: Payer: Self-pay | Admitting: Orthopaedic Surgery

## 2023-02-09 ENCOUNTER — Ambulatory Visit: Payer: Self-pay | Admitting: *Deleted

## 2023-02-09 ENCOUNTER — Encounter: Payer: Self-pay | Admitting: *Deleted

## 2023-02-10 NOTE — Patient Outreach (Signed)
  Care Coordination   Follow Up Visit Note   02/09/2023 Name: Larry Reese MRN: [id number] DOB: April 26, 1946  Larry Reese is a 77 y.o. year old male who sees Larry Reese, Larry Salinas, MD for primary care. I spoke with  Larry Reese by phone today.  What matters to the patients health and wellness today?  Managing joint pain    Goals Addressed               This Visit's Progress     Patient Stated     Chronic pain control (pt-stated)   On track     Care Coordination Goals: Follow-up with PCP and Dr Larry Reese (Ortho) as recommended Reach out to provider with any new or worsening symptoms  Take medication as prescribed Talk with provider about alternative therapies and PT for chronic left shoulder pain Reach out to RN Care Coordinator with any care coordination or resource needs [phone]       Other     COMPLETED: Manage Medications        Care Coordination Goals: Take medications as prescribed Call [facility] at [phone] to order Crestor  Talk with a representative to request refill that should be on file from Dr Larry Reese on 10/22/22 because current bottle and Rx number do not have any remaining refills Call RN Care Coordinator with any care coordination or resource needs [phone]         SDOH assessments and interventions completed:  Yes  SDOH Interventions Today    Flowsheet Row Most Recent Value  SDOH Interventions   Transportation Interventions Intervention Not Indicated  Financial Strain Interventions Intervention Not Indicated        Care Coordination Interventions:  Yes, provided  Interventions Today    Flowsheet Row Most Recent Value  Chronic Disease   Chronic disease during today's visit Other  [arthritis]  General Interventions   General Interventions Discussed/Reviewed General Interventions Discussed, General Interventions Reviewed, Doctor Visits  Doctor Visits Discussed/Reviewed Doctor Visits Discussed,  Doctor Visits Reviewed, PCP, Specialist  PCP/Specialist Visits Compliance with follow-up visit  Exercise Interventions   Exercise Discussed/Reviewed Exercise Discussed, Exercise Reviewed, Physical Activity  Physical Activity Discussed/Reviewed Physical Activity Discussed, Physical Activity Reviewed, Home Exercise Program (HEP)  Education Interventions   Education Provided Provided Education  Provided Verbal Education On Mental Health/Coping with Illness, When to see the doctor, Exercise  Pharmacy Interventions   Pharmacy Dicussed/Reviewed Pharmacy Topics Discussed, Pharmacy Topics Reviewed, Medications and their functions  Safety Interventions   Safety Discussed/Reviewed Safety Discussed, Safety Reviewed, Fall Risk, Home Safety       Follow up plan: Follow up call scheduled for 04/14/23    Encounter Outcome:  Pt. Visit Completed   Demetrios Loll, BSN, RN-BC RN Care Coordinator [facility]  [facility] Direct Dial: [phone] Main #: [phone]

## 2023-02-14 DIAGNOSIS — I4821 Permanent atrial fibrillation: Secondary | ICD-10-CM | POA: Diagnosis not present

## 2023-02-14 DIAGNOSIS — I10 Essential (primary) hypertension: Secondary | ICD-10-CM | POA: Diagnosis not present

## 2023-03-02 ENCOUNTER — Ambulatory Visit (INDEPENDENT_AMBULATORY_CARE_PROVIDER_SITE_OTHER): Payer: Medicare PPO | Admitting: Orthopaedic Surgery

## 2023-03-02 ENCOUNTER — Encounter: Payer: Self-pay | Admitting: Orthopaedic Surgery

## 2023-03-02 VITALS — BP 119/76 | HR 72 | Ht 78.5 in | Wt 194.4 lb

## 2023-03-02 DIAGNOSIS — M25512 Pain in left shoulder: Secondary | ICD-10-CM | POA: Diagnosis not present

## 2023-03-02 DIAGNOSIS — G8929 Other chronic pain: Secondary | ICD-10-CM | POA: Diagnosis not present

## 2023-03-02 DIAGNOSIS — F17210 Nicotine dependence, cigarettes, uncomplicated: Secondary | ICD-10-CM

## 2023-03-02 NOTE — Progress Notes (Signed)
My shoulder is sore at times.  He still has chronic pain of the left shoulder.  He has no new trauma.  He has no paresthesias.  He has no swelling.  Left shoulder has forward motion of 150, abduction 130, internal 25, external 20, extension 5, adduction full.  NV intact.  Encounter Diagnoses  Name Primary?   Chronic left shoulder pain Yes   Cigarette nicotine dependence without complication    He will have refill on his medicine next week.  He wants more pills.  Return in three months.  Call if any problem.  Precautions discussed.  Electronically Signed Darreld Mclean, MD 6/12/202410:30 AM

## 2023-03-02 NOTE — Patient Instructions (Signed)
Smoking Tobacco Information, Adult Smoking tobacco can be harmful to your health. Tobacco contains a toxic colorless chemical called nicotine. Nicotine causes changes in your brain that make you want more and more. This is called addiction. This can make it hard to stop smoking once you start. Tobacco also has other toxic chemicals that can hurt your body and raise your risk of many cancers. Menthol or "lite" tobacco or cigarette brands are not safer than regular brands. How can smoking tobacco affect me? Smoking tobacco puts you at risk for: Cancer. Smoking is most commonly associated with lung cancer, but can also lead to cancer in other parts of the body. Chronic obstructive pulmonary disease (COPD). This is a long-term lung condition that makes it hard to breathe. It also gets worse over time. High blood pressure (hypertension), heart disease, stroke, heart attack, and lung infections, such as pneumonia. Cataracts. This is when the lenses in the eyes become clouded. Digestive problems. This may include peptic ulcers, heartburn, and gastroesophageal reflux disease (GERD). Oral health problems, such as gum disease, mouth sores, and tooth loss. Loss of taste and smell. Smoking also affects how you look and smell. Smoking may cause: Wrinkles. Yellow or stained teeth, fingers, and fingernails. Bad breath. Bad-smelling clothes and hair. Smoking tobacco can also affect your social life, because: It may be challenging to find places to smoke when away from home. Many workplaces, restaurants, hotels, and public places are tobacco-free. Smoking is expensive. This is due to the cost of tobacco and the long-term costs of treating health problems from smoking. Secondhand smoke may affect those around you. Secondhand smoke can cause lung cancer, breathing problems, and heart disease. Children of smokers have a higher risk for: Sudden infant death syndrome (SIDS). Ear infections. Lung infections. What  actions can I take to prevent health problems? Quit smoking  Do not start smoking. Quit if you already smoke. Do not replace cigarette smoking with vaping devices, such as e-cigarettes. Make a plan to quit smoking and commit to it. Look for programs to help you, and ask your health care provider for recommendations and ideas. Set a date and write down all the reasons you want to quit. Let your friends and family know you are quitting so they can help and support you. Consider finding friends who also want to quit. It can be easier to quit with someone else, so that you can support each other. Talk with your health care provider about using nicotine replacement medicines to help you quit. These include gum, lozenges, patches, sprays, or pills. If you try to quit but return to smoking, stay positive. It is common to slip up when you first quit, so take it one day at a time. Be prepared for cravings. When you feel the urge to smoke, chew gum or suck on hard candy. Lifestyle Stay busy. Take care of your body. Get plenty of exercise, eat a healthy diet, and drink plenty of water. Find ways to manage your stress, such as meditation, yoga, exercise, or time spent with friends and family. Ask your health care provider about having regular tests (screenings) to check for cancer. This may include blood tests, imaging tests, and other tests. Where to find support To get support to quit smoking, consider: Asking your health care provider for more information and resources. Joining a support group for people who want to quit smoking in your local community. There are many effective programs that may help you to quit. Calling the smokefree.gov counselor   helpline at 1-800-QUIT-NOW (1-800-784-8669). Where to find more information You may find more information about quitting smoking from: Centers for Disease Control and Prevention: cdc.gov/tobacco Smokefree.gov: smokefree.gov American Lung Association:  freedomfromsmoking.org Contact a health care provider if: You have problems breathing. Your lips, nose, or fingers turn blue. You have chest pain. You are coughing up blood. You feel like you will faint. You have other health changes that cause you to worry. Summary Smoking tobacco can negatively affect your health, the health of those around you, your finances, and your social life. Do not start smoking. Quit if you already smoke. If you need help quitting, ask your health care provider. Consider joining a support group for people in your local community who want to quit smoking. There are many effective programs that may help you to quit. This information is not intended to replace advice given to you by your health care provider. Make sure you discuss any questions you have with your health care provider. Document Revised: 09/01/2021 Document Reviewed: 09/01/2021 Elsevier Patient Education  2024 Elsevier Inc.  

## 2023-03-03 ENCOUNTER — Ambulatory Visit: Payer: Medicare PPO | Admitting: Orthopaedic Surgery

## 2023-03-04 DIAGNOSIS — L603 Nail dystrophy: Secondary | ICD-10-CM | POA: Diagnosis not present

## 2023-03-04 DIAGNOSIS — L84 Corns and callosities: Secondary | ICD-10-CM | POA: Diagnosis not present

## 2023-03-04 DIAGNOSIS — I739 Peripheral vascular disease, unspecified: Secondary | ICD-10-CM | POA: Diagnosis not present

## 2023-03-04 DIAGNOSIS — M2041 Other hammer toe(s) (acquired), right foot: Secondary | ICD-10-CM | POA: Diagnosis not present

## 2023-03-04 DIAGNOSIS — M792 Neuralgia and neuritis, unspecified: Secondary | ICD-10-CM | POA: Diagnosis not present

## 2023-03-04 DIAGNOSIS — B351 Tinea unguium: Secondary | ICD-10-CM | POA: Diagnosis not present

## 2023-03-07 ENCOUNTER — Telehealth: Payer: Self-pay | Admitting: Orthopaedic Surgery

## 2023-03-17 DIAGNOSIS — I10 Essential (primary) hypertension: Secondary | ICD-10-CM | POA: Diagnosis not present

## 2023-03-17 DIAGNOSIS — I4821 Permanent atrial fibrillation: Secondary | ICD-10-CM | POA: Diagnosis not present

## 2023-04-06 ENCOUNTER — Telehealth: Payer: Self-pay | Admitting: Orthopaedic Surgery

## 2023-04-14 ENCOUNTER — Ambulatory Visit: Payer: Self-pay | Admitting: *Deleted

## 2023-04-14 NOTE — Patient Outreach (Signed)
  Care Coordination   04/14/2023 Name: Larry Reese MRN: [id number] DOB: Oct 13, 1945   Care Coordination Outreach Attempts:  An unsuccessful telephone outreach was attempted for a scheduled appointment today.  Follow Up Plan:  Additional outreach attempts will be made to offer the patient care coordination information and services.   Encounter Outcome:  No Answer   Care Coordination Interventions:  No, not indicated    Demetrios Loll, BSN, RN-BC RN Care Coordinator [facility]  [facility] Direct Dial: [phone] Main #: [phone]

## 2023-04-16 DIAGNOSIS — I10 Essential (primary) hypertension: Secondary | ICD-10-CM | POA: Diagnosis not present

## 2023-04-16 DIAGNOSIS — I4821 Permanent atrial fibrillation: Secondary | ICD-10-CM | POA: Diagnosis not present

## 2023-05-09 ENCOUNTER — Other Ambulatory Visit: Payer: Self-pay | Admitting: Orthopaedic Surgery

## 2023-05-11 ENCOUNTER — Ambulatory Visit: Payer: Self-pay | Admitting: *Deleted

## 2023-05-11 DIAGNOSIS — Z96641 Presence of right artificial hip joint: Secondary | ICD-10-CM | POA: Diagnosis not present

## 2023-05-11 DIAGNOSIS — M1990 Unspecified osteoarthritis, unspecified site: Secondary | ICD-10-CM | POA: Diagnosis not present

## 2023-05-11 DIAGNOSIS — I4821 Permanent atrial fibrillation: Secondary | ICD-10-CM | POA: Diagnosis not present

## 2023-05-11 DIAGNOSIS — I10 Essential (primary) hypertension: Secondary | ICD-10-CM | POA: Diagnosis not present

## 2023-05-11 DIAGNOSIS — N400 Enlarged prostate without lower urinary tract symptoms: Secondary | ICD-10-CM | POA: Diagnosis not present

## 2023-05-11 NOTE — Patient Outreach (Signed)
  Care Coordination   05/11/2023 Name: Larry Reese MRN: [id number] DOB: 29-Oct-1945   Care Coordination Outreach Attempts:  An unsuccessful telephone outreach was attempted for a scheduled appointment today.  Follow Up Plan:  Additional outreach attempts will be made to offer the patient care coordination information and services.   Encounter Outcome:  No Answer. Left HIPAA compliant VM.   Care Coordination Interventions:  No, not indicated    Demetrios Loll, BSN, RN-BC RN Care Coordinator [facility]  [facility] Direct Dial: [phone] Main #: [phone]

## 2023-05-17 ENCOUNTER — Telehealth: Payer: Self-pay | Admitting: Orthopaedic Surgery

## 2023-06-01 ENCOUNTER — Ambulatory Visit (INDEPENDENT_AMBULATORY_CARE_PROVIDER_SITE_OTHER): Payer: Medicare PPO | Admitting: Orthopaedic Surgery

## 2023-06-01 ENCOUNTER — Encounter: Payer: Self-pay | Admitting: Orthopaedic Surgery

## 2023-06-01 VITALS — BP 176/77 | HR 80 | Ht 78.0 in | Wt 193.0 lb

## 2023-06-01 DIAGNOSIS — M792 Neuralgia and neuritis, unspecified: Secondary | ICD-10-CM | POA: Diagnosis not present

## 2023-06-01 DIAGNOSIS — F17210 Nicotine dependence, cigarettes, uncomplicated: Secondary | ICD-10-CM

## 2023-06-01 DIAGNOSIS — B351 Tinea unguium: Secondary | ICD-10-CM | POA: Diagnosis not present

## 2023-06-01 DIAGNOSIS — L84 Corns and callosities: Secondary | ICD-10-CM | POA: Diagnosis not present

## 2023-06-01 DIAGNOSIS — L603 Nail dystrophy: Secondary | ICD-10-CM | POA: Diagnosis not present

## 2023-06-01 DIAGNOSIS — M25512 Pain in left shoulder: Secondary | ICD-10-CM

## 2023-06-01 DIAGNOSIS — M2041 Other hammer toe(s) (acquired), right foot: Secondary | ICD-10-CM | POA: Diagnosis not present

## 2023-06-01 DIAGNOSIS — G8929 Other chronic pain: Secondary | ICD-10-CM | POA: Diagnosis not present

## 2023-06-01 DIAGNOSIS — I739 Peripheral vascular disease, unspecified: Secondary | ICD-10-CM | POA: Diagnosis not present

## 2023-06-01 NOTE — Progress Notes (Signed)
My shoulder is the same.  He has chronic pain of the left shoulder.  He has no new trauma, no redness, no numbness.  ROM of the left shoulder is decreased but unchanged from last visit.  NV intact.  Grips normal, ROM of neck is full.  Encounter Diagnoses  Name Primary?   Chronic left shoulder pain Yes   Cigarette nicotine dependence without complication    Return in three months.  Continue exercises.  Call if any problem.  Precautions discussed.  Electronically Signed Darreld Mclean, MD 9/11/20249:33 AM

## 2023-06-11 DIAGNOSIS — I10 Essential (primary) hypertension: Secondary | ICD-10-CM | POA: Diagnosis not present

## 2023-06-11 DIAGNOSIS — I4821 Permanent atrial fibrillation: Secondary | ICD-10-CM | POA: Diagnosis not present

## 2023-06-13 ENCOUNTER — Telehealth: Payer: Self-pay | Admitting: Orthopaedic Surgery

## 2023-06-13 IMAGING — DX DG CHEST 2V
3 series · 3 of 3 positions shown · non-contrast
Comparison: May 01, 2020.

CLINICAL DATA: Shortness of breath.

EXAM:
CHEST - 2 VIEW

[chest pa]
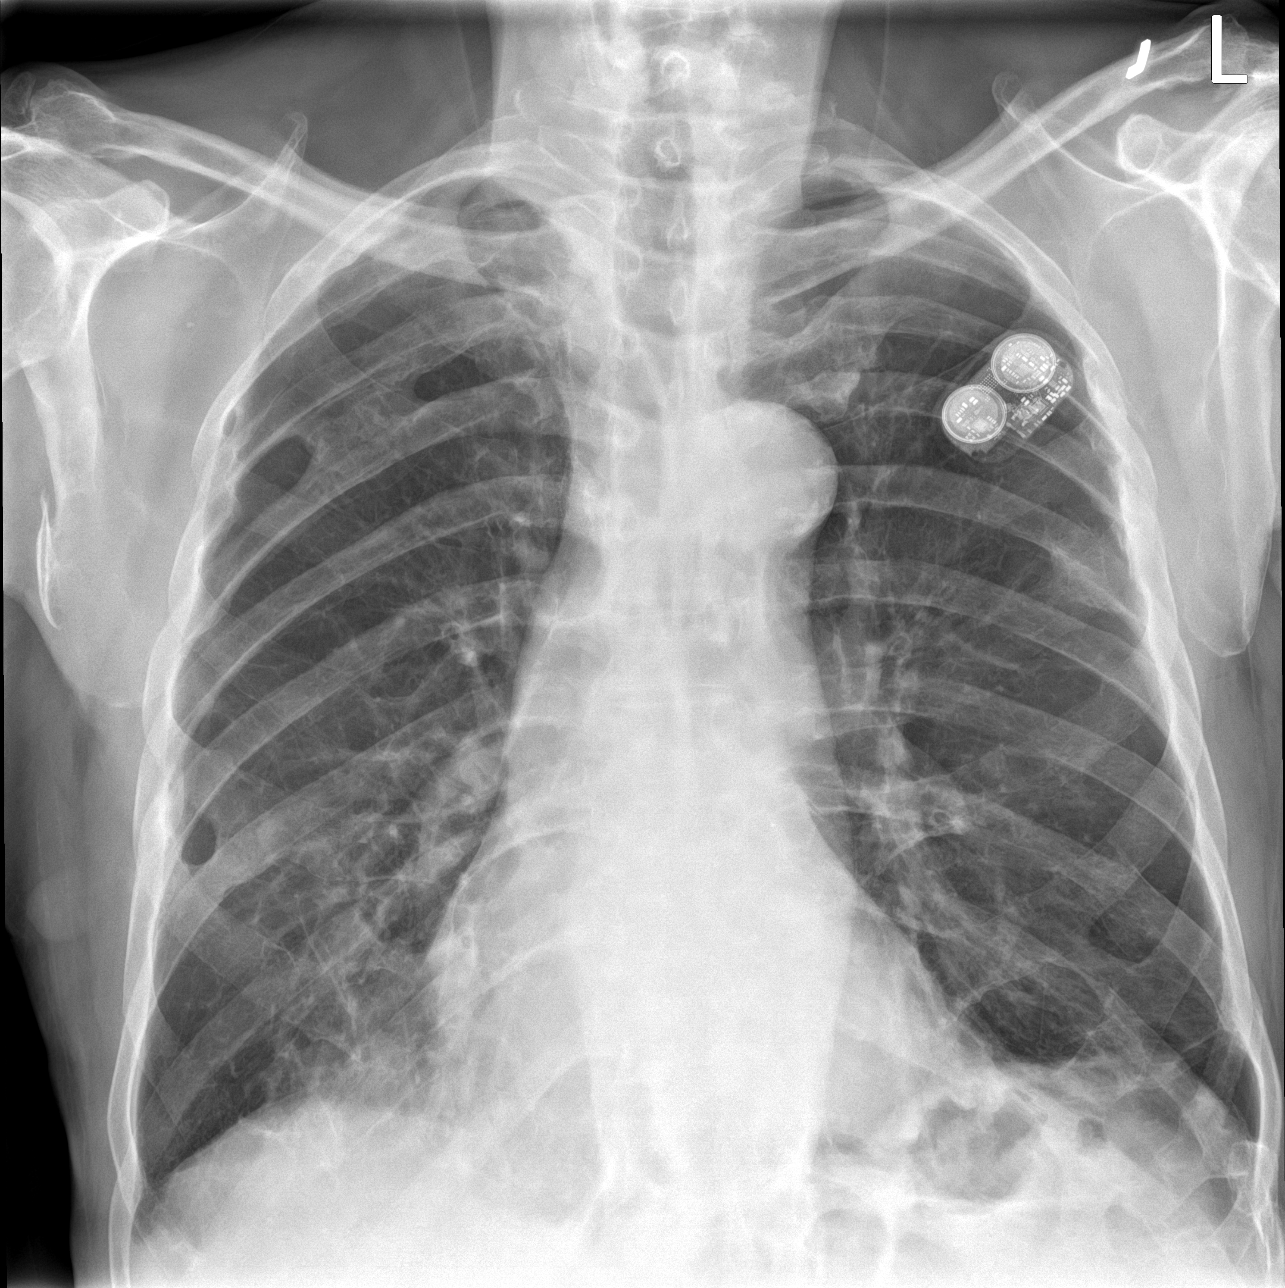

[chest lat (1 of 2)]
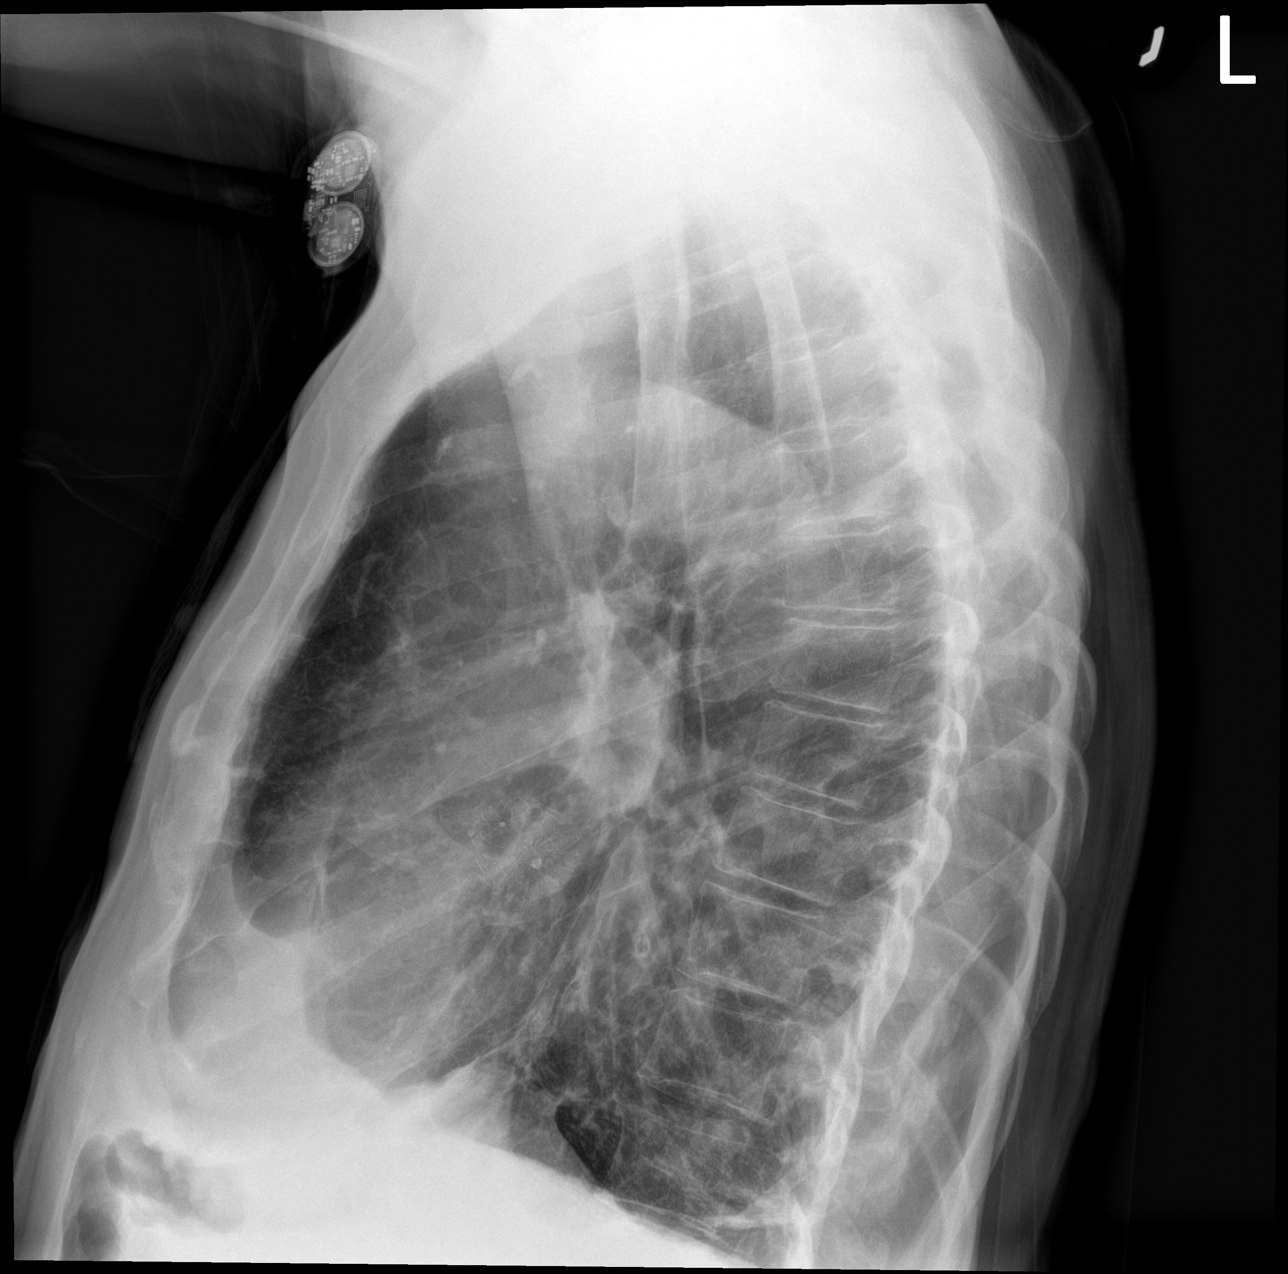

[chest lat (2 of 2)]
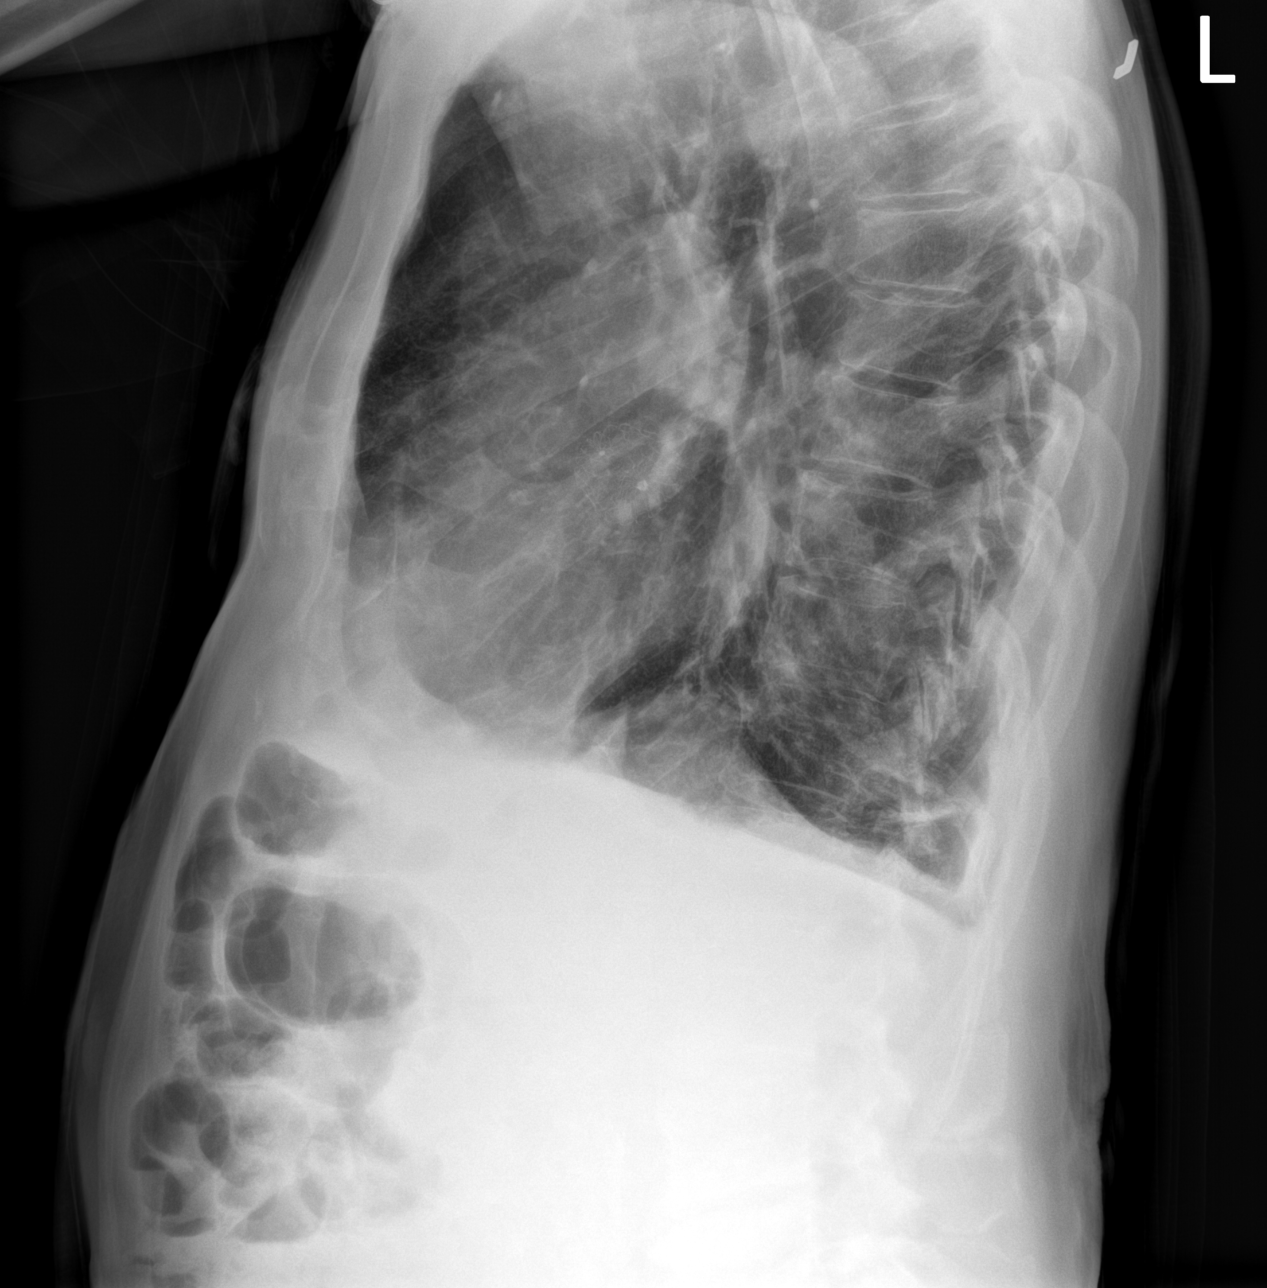

[3 of 3 positions shown; findings below may reference images not displayed]

FINDINGS: The heart size and mediastinal contours are within normal limits.
Stable bibasilar scarring is noted. No acute abnormality is noted.
The visualized skeletal structures are unremarkable.
IMPRESSION: Stable bibasilar scarring.  No acute abnormality is noted.

## 2023-07-11 DIAGNOSIS — I4821 Permanent atrial fibrillation: Secondary | ICD-10-CM | POA: Diagnosis not present

## 2023-07-11 DIAGNOSIS — I10 Essential (primary) hypertension: Secondary | ICD-10-CM | POA: Diagnosis not present

## 2023-07-13 ENCOUNTER — Telehealth: Payer: Self-pay | Admitting: Orthopaedic Surgery

## 2023-07-28 ENCOUNTER — Encounter (HOSPITAL_BASED_OUTPATIENT_CLINIC_OR_DEPARTMENT_OTHER): Payer: Self-pay | Admitting: Cardiology

## 2023-07-28 ENCOUNTER — Ambulatory Visit (HOSPITAL_BASED_OUTPATIENT_CLINIC_OR_DEPARTMENT_OTHER): Payer: Medicare PPO | Admitting: Cardiology

## 2023-07-28 VITALS — BP 120/62 | HR 62 | Ht 78.0 in | Wt 191.6 lb

## 2023-07-28 DIAGNOSIS — Z95818 Presence of other cardiac implants and grafts: Secondary | ICD-10-CM

## 2023-07-28 DIAGNOSIS — R0981 Nasal congestion: Secondary | ICD-10-CM

## 2023-07-28 DIAGNOSIS — D6869 Other thrombophilia: Secondary | ICD-10-CM

## 2023-07-28 DIAGNOSIS — I77810 Thoracic aortic ectasia: Secondary | ICD-10-CM

## 2023-07-28 DIAGNOSIS — Z8679 Personal history of other diseases of the circulatory system: Secondary | ICD-10-CM

## 2023-07-28 DIAGNOSIS — I739 Peripheral vascular disease, unspecified: Secondary | ICD-10-CM | POA: Diagnosis not present

## 2023-07-28 DIAGNOSIS — E7800 Pure hypercholesterolemia, unspecified: Secondary | ICD-10-CM

## 2023-07-28 DIAGNOSIS — I4821 Permanent atrial fibrillation: Secondary | ICD-10-CM

## 2023-07-28 NOTE — Patient Instructions (Signed)
Medication Instructions:  Your physician recommends that you continue on your current medications as directed. Please refer to the Current Medication list given to you today.  *If you need a refill on your cardiac medications before your next appointment, please call your pharmacy*  Lab Work: NONE  Testing/Procedures: NONE  Follow-Up: At [facility], you and your health needs are our priority.  As part of our continuing mission to provide you with exceptional heart care, we have created designated Provider Care Teams.  These Care Teams include your primary Cardiologist (physician) and Advanced Practice Providers (APPs -  Physician Assistants and Nurse Practitioners) who all work together to provide you with the care you need, when you need it.  We recommend signing up for the patient portal called "MyChart".  Sign up information is provided on this After Visit Summary.  MyChart is used to connect with patients for Virtual Visits (Telemedicine).  Patients are able to view lab/test results, encounter notes, upcoming appointments, etc.  Non-urgent messages can be sent to your provider as well.   To learn more about what you can do with MyChart, go to [email or web address].    Your next appointment:   6 month(s)  The format for your next appointment:   In Person  Provider:   Jodelle Red, MD OR Dois Davenport NP

## 2023-07-28 NOTE — Progress Notes (Signed)
Cardiology Office Note:  .   Date:  07/28/2023  ID:  Larry Reese, DOB 20-Dec-1945, MRN [id number] PCP: Larry Spar, MD  [facility] Providers Cardiologist:  Jodelle Red, MD Electrophysiologist:  Lanier Prude, MD {  History of Present Illness: .   Larry Reese is a 77 y.o. male  with a hx of permanent atrial fibrillation, tobacco use, hypertension, subdural hematoma 2006 who is seen for follow up today. He was initially seen 05/07/20 as a new consult at the request of Larry Reese, Larry Reese* for the evaluation and management of atrial fibrillation, preoperative evaluation.   Cardiac history: At preop visit 05/01/20, noted to be in new atrial fibrillation on ECG. Referred to cardiology for evaluation prior to hip surgery. History reported stroke in 2005, but this actually appears to be a subdural hematoma in 2006. Had Watchman procedure 09/04/20.  Today: Has noticed that his breathing has gotten harder in the 3-4 months. He notices more when he is sitting still, feels that he has to take a deep breath. Noticed more after he took the Covid shot. No fevers, no shaking chills but feels cold easily. Occasional cough, with clear phlegm. Worst when he first wakes up. Nose is running all the time. Feels like he is wheezing intermittently. Smoking 10 cigarettes/day.   Takes the garbage out, doesn't make his breathing worse. Minimally active, walks with a cane. Has 3 steps in the house, no issues.  ROS: Denies chest pain. No PND, orthopnea, LE edema or unexpected weight gain. No syncope or palpitations. ROS otherwise negative except as noted.   Studies Reviewed: Marland Kitchen    EKG:       Physical Exam:   VS:  BP 120/62   Pulse 62   Ht 6\' 6"  (1.981 m)   Wt 191 lb 9.6 oz (86.9 kg)   SpO2 98%   BMI 22.14 kg/m    Wt Readings from Last 3 Encounters:  07/28/23 191 lb 9.6 oz (86.9 kg)  06/01/23 193 lb (87.5 kg)  03/02/23 194 lb 6 oz (88.2 kg)    GEN: Well  nourished, well developed in no acute distress HEENT: Normal, moist mucous membranes NECK: No JVD CARDIAC: irregularly irregular rhythm, normal S1 and S2, no rubs or gallops. No murmur. VASCULAR: Radial and DP pulses 2+ bilaterally. No carotid bruits RESPIRATORY:  Clear but somewhat distant breath sounds, without rales, wheezing or rhonchi  ABDOMEN: Soft, non-tender, non-distended MUSCULOSKELETAL:  Ambulates independently SKIN: Warm and dry, no edema NEUROLOGIC:  Alert and oriented x 3. No focal neuro deficits noted. PSYCHIATRIC:  Normal affect    ASSESSMENT AND PLAN: .    Bradycardia -asymptomatic -prior monitor with occasional dips into HR in the 30s, one 3 second pause, but good heart rate variability. In permanent afib  Heavy breathing sensation -non exertional, several months duration -associated with rhinorrhea, he notes history of allergies -distant breath sounds but no wheezing or rhonchi -current smoker -if this persists, would recommend he discuss with his PCP, may need PFTs   Atrial fibrillation, permanent CHA2DS2/VAS Stroke Risk Points =5 (age, hypertension, CVAx2, PAD) initially, though now appears to have been a subdural hematoma and not a stroke, placing his CHADSVASC=3 -s/p Watchman and doing well   Hypertension: at goal of <130/80 -continue amlodipine 10 mg daily   PAD: -continue aspirin -have discussed statin, tobacco cessation. Precontemplative, discussed again today, smoking about 1/2 ppd.   Hypercholesterolemia: -with PAD, goal LDL <70. Last LDL 141 -has declined statin  in the past   Thoracic aortic aneurysm, ascending -5 cm 08/2022 -has seen Dr. Leafy Ro, plan for repeat CT in 2-3 years -reviewed weight lifting restrictions, BP management  CV risk counseling and prevention -recommend heart healthy/Mediterranean diet, with whole grains, fruits, vegetable, fish, lean meats, nuts, and olive oil. Limit salt. -recommend moderate walking, 3-5 times/week  for 30-50 minutes each session. Aim for at least 150 minutes.week. Goal should be pace of 3 miles/hours, or walking 1.5 miles in 30 minutes -recommend avoidance of tobacco products. Avoid excess alcohol.  Dispo: 6 mos or sooner as needed  Signed, Jodelle Red, MD   Jodelle Red, MD, PhD, [facility]  [facility]  [place]  Heart & Vascular at [facility] at [facility] [address] [place], [place] [address] [phone]

## 2023-08-11 DIAGNOSIS — I10 Essential (primary) hypertension: Secondary | ICD-10-CM | POA: Diagnosis not present

## 2023-08-11 DIAGNOSIS — I4821 Permanent atrial fibrillation: Secondary | ICD-10-CM | POA: Diagnosis not present

## 2023-08-15 ENCOUNTER — Telehealth: Payer: Self-pay | Admitting: Orthopaedic Surgery

## 2023-08-31 ENCOUNTER — Ambulatory Visit (INDEPENDENT_AMBULATORY_CARE_PROVIDER_SITE_OTHER): Payer: Medicare PPO | Admitting: Orthopaedic Surgery

## 2023-08-31 ENCOUNTER — Encounter: Payer: Self-pay | Admitting: Orthopaedic Surgery

## 2023-08-31 VITALS — Ht 78.0 in | Wt 187.0 lb

## 2023-08-31 DIAGNOSIS — L603 Nail dystrophy: Secondary | ICD-10-CM | POA: Diagnosis not present

## 2023-08-31 DIAGNOSIS — L84 Corns and callosities: Secondary | ICD-10-CM | POA: Diagnosis not present

## 2023-08-31 DIAGNOSIS — M2041 Other hammer toe(s) (acquired), right foot: Secondary | ICD-10-CM | POA: Diagnosis not present

## 2023-08-31 DIAGNOSIS — M25512 Pain in left shoulder: Secondary | ICD-10-CM | POA: Diagnosis not present

## 2023-08-31 DIAGNOSIS — G8929 Other chronic pain: Secondary | ICD-10-CM

## 2023-08-31 DIAGNOSIS — I739 Peripheral vascular disease, unspecified: Secondary | ICD-10-CM | POA: Diagnosis not present

## 2023-08-31 DIAGNOSIS — F17210 Nicotine dependence, cigarettes, uncomplicated: Secondary | ICD-10-CM | POA: Diagnosis not present

## 2023-08-31 DIAGNOSIS — M792 Neuralgia and neuritis, unspecified: Secondary | ICD-10-CM | POA: Diagnosis not present

## 2023-08-31 DIAGNOSIS — B351 Tinea unguium: Secondary | ICD-10-CM | POA: Diagnosis not present

## 2023-08-31 NOTE — Progress Notes (Signed)
I hurt  He has chronic pain of the left shoulder.  We have had some cold weather that has made it worse.  He has no new trauma.  He has no paresthesias.  ROM of the left shoulder is decreased secondary to pain.  NV intact,  No effusion, no redness, grips normal.  Encounter Diagnoses  Name Primary?   Chronic left shoulder pain Yes   Cigarette nicotine dependence without complication    I will call in pain medicine next week.  Call if any problem.  Precautions discussed.  Return in three months.  Electronically Signed Darreld Mclean, MD 12/11/20249:35 AM

## 2023-09-10 DIAGNOSIS — I4821 Permanent atrial fibrillation: Secondary | ICD-10-CM | POA: Diagnosis not present

## 2023-09-10 DIAGNOSIS — I10 Essential (primary) hypertension: Secondary | ICD-10-CM | POA: Diagnosis not present

## 2023-09-13 ENCOUNTER — Other Ambulatory Visit (HOSPITAL_BASED_OUTPATIENT_CLINIC_OR_DEPARTMENT_OTHER): Payer: Self-pay | Admitting: Cardiology

## 2023-09-16 ENCOUNTER — Other Ambulatory Visit: Payer: Self-pay | Admitting: Orthopaedic Surgery

## 2023-10-11 DIAGNOSIS — I10 Essential (primary) hypertension: Secondary | ICD-10-CM | POA: Diagnosis not present

## 2023-10-11 DIAGNOSIS — I4821 Permanent atrial fibrillation: Secondary | ICD-10-CM | POA: Diagnosis not present

## 2023-10-19 ENCOUNTER — Telehealth: Payer: Self-pay | Admitting: Orthopaedic Surgery

## 2023-11-11 DIAGNOSIS — I10 Essential (primary) hypertension: Secondary | ICD-10-CM | POA: Diagnosis not present

## 2023-11-11 DIAGNOSIS — I4821 Permanent atrial fibrillation: Secondary | ICD-10-CM | POA: Diagnosis not present

## 2023-11-21 ENCOUNTER — Telehealth: Payer: Self-pay | Admitting: Orthopaedic Surgery

## 2023-11-30 ENCOUNTER — Ambulatory Visit (INDEPENDENT_AMBULATORY_CARE_PROVIDER_SITE_OTHER): Payer: Medicare PPO | Admitting: Orthopaedic Surgery

## 2023-11-30 ENCOUNTER — Other Ambulatory Visit (INDEPENDENT_AMBULATORY_CARE_PROVIDER_SITE_OTHER)

## 2023-11-30 ENCOUNTER — Encounter: Payer: Self-pay | Admitting: Orthopaedic Surgery

## 2023-11-30 VITALS — BP 177/96 | HR 71 | Ht 78.0 in | Wt 196.0 lb

## 2023-11-30 DIAGNOSIS — B351 Tinea unguium: Secondary | ICD-10-CM | POA: Diagnosis not present

## 2023-11-30 DIAGNOSIS — G8929 Other chronic pain: Secondary | ICD-10-CM

## 2023-11-30 DIAGNOSIS — L84 Corns and callosities: Secondary | ICD-10-CM | POA: Diagnosis not present

## 2023-11-30 DIAGNOSIS — M792 Neuralgia and neuritis, unspecified: Secondary | ICD-10-CM | POA: Diagnosis not present

## 2023-11-30 DIAGNOSIS — L603 Nail dystrophy: Secondary | ICD-10-CM | POA: Diagnosis not present

## 2023-11-30 DIAGNOSIS — M25562 Pain in left knee: Secondary | ICD-10-CM

## 2023-11-30 DIAGNOSIS — M2041 Other hammer toe(s) (acquired), right foot: Secondary | ICD-10-CM | POA: Diagnosis not present

## 2023-11-30 DIAGNOSIS — I739 Peripheral vascular disease, unspecified: Secondary | ICD-10-CM | POA: Diagnosis not present

## 2023-11-30 NOTE — Progress Notes (Signed)
 He has developed pain of the left knee.  He has no giving way, no trauma.  He is post total hip on the right.  Left knee has crepitus, minimal effusion, ROM 0 to 110, slight limp left, uses cane, stable, no distal edema.  X-rays were done of the left knee, reported separately.  Encounter Diagnosis  Name Primary?   Chronic pain of left knee Yes   PROCEDURE NOTE:  The patient requests injections of the left knee , verbal consent was obtained.  The left knee was prepped appropriately after time out was performed.   Sterile technique was observed and injection of 1 cc of DepoMedrol 40 mg with several cc's of plain xylocaine. Anesthesia was provided by ethyl chloride and a 20-gauge needle was used to inject the knee area. The injection was tolerated well.  A band aid dressing was applied.  The patient was advised to apply ice later today and tomorrow to the injection sight as needed.  Return in six weeks.  Call if any problem.  Precautions discussed.  Electronically Signed Darreld Mclean, MD 3/12/202510:38 AM

## 2023-12-01 DIAGNOSIS — Z1389 Encounter for screening for other disorder: Secondary | ICD-10-CM | POA: Diagnosis not present

## 2023-12-01 DIAGNOSIS — J439 Emphysema, unspecified: Secondary | ICD-10-CM | POA: Diagnosis not present

## 2023-12-01 DIAGNOSIS — Z1331 Encounter for screening for depression: Secondary | ICD-10-CM | POA: Diagnosis not present

## 2023-12-01 DIAGNOSIS — F17210 Nicotine dependence, cigarettes, uncomplicated: Secondary | ICD-10-CM | POA: Diagnosis not present

## 2023-12-01 DIAGNOSIS — I10 Essential (primary) hypertension: Secondary | ICD-10-CM | POA: Diagnosis not present

## 2023-12-01 DIAGNOSIS — Z0001 Encounter for general adult medical examination with abnormal findings: Secondary | ICD-10-CM | POA: Diagnosis not present

## 2023-12-01 DIAGNOSIS — I719 Aortic aneurysm of unspecified site, without rupture: Secondary | ICD-10-CM | POA: Diagnosis not present

## 2023-12-01 DIAGNOSIS — F17200 Nicotine dependence, unspecified, uncomplicated: Secondary | ICD-10-CM | POA: Diagnosis not present

## 2023-12-01 DIAGNOSIS — I739 Peripheral vascular disease, unspecified: Secondary | ICD-10-CM | POA: Diagnosis not present

## 2023-12-20 ENCOUNTER — Telehealth: Payer: Self-pay | Admitting: Orthopaedic Surgery

## 2023-12-20 MED ORDER — HYDROCODONE-ACETAMINOPHEN 7.5-325 MG PO TABS: 1.0000 | ORAL_TABLET | Freq: Four times a day (QID) | ORAL | 0 refills | Status: DC | PRN

## 2023-12-20 NOTE — Addendum Note (Signed)
 Addended by: Thane Edu A on: 12/20/2023 03:51 PM   Modules accepted: Orders

## 2023-12-20 NOTE — Telephone Encounter (Signed)
 Dr. Sanjuan Dame pt - spouse presented to the office requesting a refill for Hydrocodone 7.5-325, 58 tablets, Every 6 hours PRN for moderate pain (pain score 4-6) to be sent to [facility].

## 2023-12-23 ENCOUNTER — Ambulatory Visit: Admitting: Orthopedic Surgery

## 2023-12-23 DIAGNOSIS — Z0001 Encounter for general adult medical examination with abnormal findings: Secondary | ICD-10-CM | POA: Diagnosis not present

## 2023-12-23 DIAGNOSIS — N400 Enlarged prostate without lower urinary tract symptoms: Secondary | ICD-10-CM | POA: Diagnosis not present

## 2023-12-23 DIAGNOSIS — I10 Essential (primary) hypertension: Secondary | ICD-10-CM | POA: Diagnosis not present

## 2023-12-23 DIAGNOSIS — M25512 Pain in left shoulder: Secondary | ICD-10-CM

## 2023-12-23 DIAGNOSIS — I4821 Permanent atrial fibrillation: Secondary | ICD-10-CM | POA: Diagnosis not present

## 2023-12-23 DIAGNOSIS — G8929 Other chronic pain: Secondary | ICD-10-CM | POA: Diagnosis not present

## 2023-12-23 MED ORDER — METHYLPREDNISOLONE ACETATE 40 MG/ML IJ SUSP
40.0000 mg | Freq: Once | INTRAMUSCULAR | Status: AC
  Administered 2023-12-23: 40 mg via INTRA_ARTICULAR

## 2023-12-23 MED ORDER — HYDROCODONE-ACETAMINOPHEN 7.5-325 MG PO TABS: 1.0000 | ORAL_TABLET | Freq: Four times a day (QID) | ORAL | 0 refills | Status: AC | PRN

## 2023-12-23 NOTE — Addendum Note (Signed)
 Addended by: Elvina Mattes T on: 12/23/2023 12:15 PM   Modules accepted: Orders

## 2023-12-23 NOTE — Progress Notes (Signed)
 Chief Complaint  Patient presents with   Injections    Left shoulder injection= Dr. Kirtland Bouchard pt. Going on cruise and wants to have done before he leaves    No diagnosis found.  Request injection left shoulder  Request for prescription refill  Procedure note the subacromial injection shoulder left   Verbal consent was obtained to inject the  Left   Shoulder  Timeout was completed to confirm the injection site is a subacromial space of the  left  shoulder  Medication used Depo-Medrol 40 mg and lidocaine 1% 3 cc  Anesthesia was provided by ethyl chloride  The injection was performed in the left  posterior subacromial space. After pinning the skin with alcohol and anesthetized the skin with ethyl chloride the subacromial space was injected using a 20-gauge needle. There were no complications  Sterile dressing was applied.

## 2023-12-28 ENCOUNTER — Other Ambulatory Visit (HOSPITAL_COMMUNITY): Payer: Self-pay | Admitting: Gerontology

## 2023-12-28 DIAGNOSIS — R7401 Elevation of levels of liver transaminase levels: Secondary | ICD-10-CM

## 2024-01-02 DIAGNOSIS — I10 Essential (primary) hypertension: Secondary | ICD-10-CM | POA: Diagnosis not present

## 2024-01-02 DIAGNOSIS — I4821 Permanent atrial fibrillation: Secondary | ICD-10-CM | POA: Diagnosis not present

## 2024-01-10 ENCOUNTER — Ambulatory Visit (HOSPITAL_COMMUNITY)
Admission: RE | Admit: 2024-01-10 | Discharge: 2024-01-10 | Disposition: A | Discharge status: Home / Self Care | Source: Ambulatory Visit | Attending: Gerontology | Admitting: Gerontology

## 2024-01-10 DIAGNOSIS — R7401 Elevation of levels of liver transaminase levels: Secondary | ICD-10-CM | POA: Insufficient documentation

## 2024-01-11 DIAGNOSIS — Z1211 Encounter for screening for malignant neoplasm of colon: Secondary | ICD-10-CM | POA: Diagnosis not present

## 2024-01-11 DIAGNOSIS — Z1212 Encounter for screening for malignant neoplasm of rectum: Secondary | ICD-10-CM | POA: Diagnosis not present

## 2024-01-13 ENCOUNTER — Telehealth: Payer: Self-pay | Admitting: Orthopaedic Surgery

## 2024-01-19 LAB — COLOGUARD: COLOGUARD: NEGATIVE

## 2024-02-01 DIAGNOSIS — I4821 Permanent atrial fibrillation: Secondary | ICD-10-CM | POA: Diagnosis not present

## 2024-02-01 DIAGNOSIS — I10 Essential (primary) hypertension: Secondary | ICD-10-CM | POA: Diagnosis not present

## 2024-02-15 ENCOUNTER — Other Ambulatory Visit (HOSPITAL_BASED_OUTPATIENT_CLINIC_OR_DEPARTMENT_OTHER): Payer: Self-pay | Admitting: Cardiology

## 2024-02-16 ENCOUNTER — Ambulatory Visit: Admitting: Orthopaedic Surgery

## 2024-02-16 ENCOUNTER — Other Ambulatory Visit: Payer: Self-pay | Admitting: *Deleted

## 2024-02-16 ENCOUNTER — Encounter: Payer: Self-pay | Admitting: Orthopaedic Surgery

## 2024-02-16 ENCOUNTER — Other Ambulatory Visit: Payer: Self-pay

## 2024-02-16 DIAGNOSIS — I739 Peripheral vascular disease, unspecified: Secondary | ICD-10-CM

## 2024-02-16 DIAGNOSIS — M79672 Pain in left foot: Secondary | ICD-10-CM | POA: Diagnosis not present

## 2024-02-16 DIAGNOSIS — L03032 Cellulitis of left toe: Secondary | ICD-10-CM

## 2024-02-16 NOTE — Progress Notes (Unsigned)
 Patient ID: Larry Reese, male   DOB: September 22, 1945, 78 y.o.   MRN: [id number]  Reason for Consult: No chief complaint on file.   Referred by Pleasant Brilliant, MD  Subjective:  HPI Larry Reese is a 78 y.o. male presenting for evaluation of left foot swelling and toe blisters.  He denies any symptoms or history of claudication.  He denies rest pain or history of ulceration. ***  Past Medical History:  Diagnosis Date   A-fib (HCC)    Anemia    Arthritis    Bradycardia    Dilatation of thoracic aorta (HCC)    dilated aortic root and ascending aorta   Dupuytren's contracture of right hand    Hx of adenomatous colonic polyps    Hypertension    Pneumothorax 09/18/1998   spontaneous right PTX 09/18/98, s/p chest tube   SDH (subdural hematoma) (HCC) 10/27/2004   left chronic SDH versus hygroma 10/27/04 CT   Stroke [facility]) 2005   left sided weakness   Trigger finger    Family History  Problem Relation Age of Onset   Esophageal cancer Father    Colon cancer Neg Hx    Past Surgical History:  Procedure Laterality Date   COLONOSCOPY  May 2010   Dr. fields: 5 cm terminal ileum normal, 6 polyps removed, moderate internal hemorrhoids, simple adenomas, next colonoscopy May 2015   COLONOSCOPY N/A 08/01/2015   Procedure: COLONOSCOPY;  Surgeon: Alyce Jubilee, MD;  [facility]: [facility];  Service: Endoscopy;  Laterality: N/A;  0830   CRANIOTOMY  2000   pressure   INGUINAL HERNIA REPAIR Right    INGUINAL HERNIA REPAIR Left 02/09/2022   Procedure: OPEN LEFT INGUINAL HERNIA REPAIR;  Surgeon: Shela Derby, MD;  Location: [facility] OR;  Service: General;  Laterality: Left;   INSERTION OF MESH Left 02/09/2022   Procedure: INSERTION OF MESH;  Surgeon: Shela Derby, MD;  Location: [facility] OR;  Service: General;  Laterality: Left;   LEFT ATRIAL APPENDAGE OCCLUSION N/A 09/04/2020   Procedure: LEFT ATRIAL APPENDAGE OCCLUSION;  Surgeon: Boyce Byes, MD;  Location: [facility] INVASIVE CV LAB;  Service:  Cardiovascular;  Laterality: N/A;   OLECRANON BURSECTOMY Right 02/11/2015   Procedure: EXCISION RIGHT OLECRANON BURSA;  Surgeon: Pleasant Brilliant, MD;  Location: [facility] ORS;  Service: Orthopedics;  Laterality: Right;   repair of trigger finger Left    pinky   TEE WITHOUT CARDIOVERSION N/A 09/04/2020   Procedure: TRANSESOPHAGEAL ECHOCARDIOGRAM (TEE);  Surgeon: Boyce Byes, MD;  Location: [facility] INVASIVE CV LAB;  Service: Cardiovascular;  Laterality: N/A;   TEE WITHOUT CARDIOVERSION N/A 10/13/2020   Procedure: TRANSESOPHAGEAL ECHOCARDIOGRAM (TEE);  Surgeon: Harrold Lincoln, MD;  Location: [facility] ENDOSCOPY;  Service: Cardiovascular;  Laterality: N/A;   TOTAL HIP ARTHROPLASTY Right 10/19/2021   Procedure: RIGHT TOTAL HIP ARTHROPLASTY ANTERIOR APPROACH;  Surgeon: Wes Hamman, MD;  Location: [facility] OR;  Service: Orthopedics;  Laterality: Right;  3-C    Short Social History:  Social History   Tobacco Use   Smoking status: Every Day    Current packs/day: 0.25    Average packs/day: 0.3 packs/day for 50.0 years (12.5 ttl pk-yrs)    Types: Cigarettes   Smokeless tobacco: Never   Tobacco comments:    5-6 cigarettes per day  Substance Use Topics   Alcohol  use: Not Currently    Allergies  Allergen Reactions   Aleve [Naproxen] Other (See Comments)    Numbness in extremities    Celebrex [Celecoxib] Other (  See Comments)    Celebrex. Nose bleed.   Fluoxetine Hcl Swelling    Current Outpatient Medications  Medication Sig Dispense Refill   acetaminophen  (TYLENOL ) 500 MG tablet Take 500-1,000 mg by mouth every 6 (six) hours as needed (for pain.).     amLODipine  (NORVASC ) 10 MG tablet Take 10 mg by mouth in the morning.     amoxicillin  (AMOXIL ) 500 MG capsule Take 4 pills one hour prior to dental work 8 capsule 2   aspirin  EC 81 MG tablet Take 1 tablet (81 mg total) by mouth 2 (two) times daily. To be taken after surgery to prevent blood clots (Patient taking differently: Take 81 mg by mouth every 6  (six) hours as needed for mild pain (pain score 1-3). To be taken after surgery to prevent blood clots) 84 tablet 0   HYDROcodone -acetaminophen  (NORCO ) 7.5-325 MG tablet Take 1 tablet by mouth every 6 (six) hours as needed for moderate pain (pain score 4-6). 56 tablet 0   Lidocaine -Menthol  4-1 % LIQD Apply 1 application. topically 3 (three) times daily as needed (pain.). NERVIVE PAIN RELIEVING Liquid Roll-on     rosuvastatin  (CRESTOR ) 10 MG tablet TAKE 1 TABLET EVERY DAY 90 tablet 1   tamsulosin  (FLOMAX ) 0.4 MG CAPS capsule Take 0.4 mg by mouth daily.      traZODone (DESYREL) 100 MG tablet Take 100 mg by mouth at bedtime as needed for sleep.     vitamin E 180 MG (400 UNITS) capsule Take 400 Units by mouth 2 (two) times a week.     No current facility-administered medications for this visit.    REVIEW OF SYSTEMS  All other systems reviewed and are negative  Objective:  Objective   There were no vitals filed for this visit. There is no height or weight on file to calculate BMI.  Physical Exam General: no acute distress Cardiac: hemodynamically stable Pulm: normal work of breathing Abdomen: non-tender, no pulsatile mass*** Neuro: alert, no focal deficit Extremities: no edema, cyanosis or wounds*** Vascular:   Right: ***  Left: ***  Data: ABI ***     Assessment/Plan:   Larry Reese is a 78 y.o. male with ***  Recommendations to optimize cardiovascular risk: Abstinence from all tobacco products. Blood glucose control with goal A1c < 7%. Blood pressure control with goal blood pressure < 140/90 mmHg. Lipid reduction therapy with goal LDL-C <100 mg/dL  Aspirin  81mg  PO QD.  Atorvastatin 40-80mg  PO QD (or other "high intensity" statin therapy).   Philipp Brawn MD [facility]

## 2024-02-16 NOTE — Progress Notes (Signed)
 My left toes are swollen.  He developed swelling of the left ankle about six days ago.  He developed swelling of the toes and forefoot about five days ago.  He did nothing for it, just observed it.  He showed his toes to his wife last night.  She called for appointment today.  He has no trauma.  He has had heart stents placed and has been told he has "circulation problems".  He denies any trauma or change of shoe wear.  Left ankle has swelling diffusely.  Right ankle has just a trace.  Pulses are felt on the right foot and the left foot.  All the toes on the left are swollen, reddish color, two small blister defects noted, one on the dorsum of the great toe, one on dorsum of little toe on the left.  Fourth toe has more swelling and redness than the other toes. Sensation present but decreased to the left foot.  Slight limp to the left when walking.  He has a cane.  Encounter Diagnoses  Name Primary?   Pain in left foot Yes   Cellulitis of all toes of left foot    X-rays were done of the left foot, reported separately.  No fracture is seen of the left foot, there is soft tissue swelling present of the toes.  X-rays were done of the left ankle, reported separately.  He has old fusion of the distal fibula and the distal tibia with extra bone formation distal posterior tibia.  Mortise is normal.  No fracture noted.  I will set up urgent referral to vascular surgery for his foot.  He has a vascular problem that needs to be addressed promptly.  I have told his wife. He will have appointment there tomorrow.  Call if any problem. Go to ER if worse.  Precautions discussed.  Electronically Signed Pleasant Brilliant, MD 5/29/202511:20 AM

## 2024-02-16 NOTE — H&P (View-Only) (Signed)
 Patient ID: Larry Reese, male   DOB: [birth year], 78 y.o.   MRN: [id number]  Reason for Consult: New Patient (Initial Visit)   Referred by Pleasant Brilliant, MD  Subjective:  HPI Larry Reese is a 78 y.o. male presenting for evaluation of left foot swelling and toe blisters.  He denies any symptoms or history of claudication.  He does have episodes of rest pain in his left foot.  He describes these been happening intermittently and goes on for a few weeks but has been happening every few months.  He does not know how long blisters have been there but at least over a month.  He is a current smoker and smokes about half pack per day.  He has A-fib but is not on anticoagulation as he had a left atrial appendage occlusion device placed.  He denies any previous vascular surgeries.  Past Medical History:  Diagnosis Date   A-fib (HCC)    Anemia    Arthritis    Bradycardia    Dilatation of thoracic aorta (HCC)    dilated aortic root and ascending aorta   Dupuytren's contracture of right hand    Hx of adenomatous colonic polyps    Hypertension    Peripheral vascular disease (HCC)    Pneumothorax 09/18/1998   spontaneous right PTX 09/18/98, s/p chest tube   SDH (subdural hematoma) (HCC) 10/27/2004   left chronic SDH versus hygroma 10/27/04 CT   Stroke [facility] 2005   left sided weakness   Trigger finger    Family History  Problem Relation Age of Onset   Esophageal cancer Father    Colon cancer Neg Hx    Past Surgical History:  Procedure Laterality Date   COLONOSCOPY  May 2010   Dr. fields: 5 cm terminal ileum normal, 6 polyps removed, moderate internal hemorrhoids, simple adenomas, next colonoscopy May 2015   COLONOSCOPY N/A 08/01/2015   Procedure: COLONOSCOPY;  Surgeon: Alyce Jubilee, MD;  [facility];  Service: Endoscopy;  Laterality: N/A;  0830   CRANIOTOMY  2000   pressure   INGUINAL HERNIA REPAIR Right    INGUINAL HERNIA REPAIR Left 02/09/2022   Procedure: OPEN  LEFT INGUINAL HERNIA REPAIR;  Surgeon: Shela Derby, MD;  Location: [facility] OR;  Service: General;  Laterality: Left;   INSERTION OF MESH Left 02/09/2022   Procedure: INSERTION OF MESH;  Surgeon: Shela Derby, MD;  Location: [facility] OR;  Service: General;  Laterality: Left;   LEFT ATRIAL APPENDAGE OCCLUSION N/A 09/04/2020   Procedure: LEFT ATRIAL APPENDAGE OCCLUSION;  Surgeon: Boyce Byes, MD;  Location: [facility] INVASIVE CV LAB;  Service: Cardiovascular;  Laterality: N/A;   OLECRANON BURSECTOMY Right 02/11/2015   Procedure: EXCISION RIGHT OLECRANON BURSA;  Surgeon: Pleasant Brilliant, MD;  Location: [facility] ORS;  Service: Orthopedics;  Laterality: Right;   repair of trigger finger Left    pinky   TEE WITHOUT CARDIOVERSION N/A 09/04/2020   Procedure: TRANSESOPHAGEAL ECHOCARDIOGRAM (TEE);  Surgeon: Boyce Byes, MD;  Location: [facility] INVASIVE CV LAB;  Service: Cardiovascular;  Laterality: N/A;   TEE WITHOUT CARDIOVERSION N/A 10/13/2020   Procedure: TRANSESOPHAGEAL ECHOCARDIOGRAM (TEE);  Surgeon: Harrold Lincoln, MD;  Location: [facility] ENDOSCOPY;  Service: Cardiovascular;  Laterality: N/A;   TOTAL HIP ARTHROPLASTY Right 10/19/2021   Procedure: RIGHT TOTAL HIP ARTHROPLASTY ANTERIOR APPROACH;  Surgeon: Wes Hamman, MD;  Location: [facility] OR;  Service: Orthopedics;  Laterality: Right;  3-C    Short Social History:  Social History  Tobacco Use   Smoking status: Every Day    Current packs/day: 0.25    Average packs/day: 0.3 packs/day for 50.0 years (12.5 ttl pk-yrs)    Types: Cigarettes   Smokeless tobacco: Never   Tobacco comments:    5-6 cigarettes per day  Substance Use Topics   Alcohol  use: Not Currently    Allergies  Allergen Reactions   Aleve [Naproxen] Other (See Comments)    Numbness in extremities    Celebrex [Celecoxib] Other (See Comments)    Celebrex. Nose bleed.   Fluoxetine Hcl Swelling    Current Outpatient Medications  Medication Sig Dispense Refill   acetaminophen  (TYLENOL )  500 MG tablet Take 500-1,000 mg by mouth every 6 (six) hours as needed (for pain.).     amLODipine  (NORVASC ) 10 MG tablet Take 10 mg by mouth in the morning.     amoxicillin  (AMOXIL ) 500 MG capsule Take 4 pills one hour prior to dental work 8 capsule 2   aspirin  EC 81 MG tablet Take 1 tablet (81 mg total) by mouth 2 (two) times daily. To be taken after surgery to prevent blood clots (Patient taking differently: Take 81 mg by mouth every 6 (six) hours as needed for mild pain (pain score 1-3). To be taken after surgery to prevent blood clots) 84 tablet 0   HYDROcodone -acetaminophen  (NORCO ) 7.5-325 MG tablet Take 1 tablet by mouth every 6 (six) hours as needed for moderate pain (pain score 4-6). 56 tablet 0   Lidocaine -Menthol  4-1 % LIQD Apply 1 application. topically 3 (three) times daily as needed (pain.). NERVIVE PAIN RELIEVING Liquid Roll-on     rosuvastatin  (CRESTOR ) 10 MG tablet TAKE 1 TABLET EVERY DAY 90 tablet 1   tamsulosin  (FLOMAX ) 0.4 MG CAPS capsule Take 0.4 mg by mouth daily.      traZODone (DESYREL) 100 MG tablet Take 100 mg by mouth at bedtime as needed for sleep.     vitamin E 180 MG (400 UNITS) capsule Take 400 Units by mouth 2 (two) times a week.     No current facility-administered medications for this visit.    REVIEW OF SYSTEMS  All other systems reviewed and are negative  Objective:  Objective   Vitals:   02/17/24 1150  BP: 117/67  Pulse: 74  Resp: (!) 22  Temp: 99 F (37.2 C)  TempSrc: Temporal  SpO2: 96%  Weight: 190 lb 12.8 oz (86.5 kg)  Height: 6\' 6"  (1.981 m)   Body mass index is 22.05 kg/m.  Physical Exam General: no acute distress Cardiac: hemodynamically stable Pulm: normal work of breathing Abdomen: non-tender, no pulsatile mass Neuro: alert, no focal deficit Extremities:  Vascular:   Right: Palpable femoral, nonpalpable pedal's  Left: Palpable femoral, nonpalpable  pedal  Data: ABI +---------+------------------+-----+-------------------+--------+  Right   Rt Pressure (mmHg)IndexWaveform           Comment   +---------+------------------+-----+-------------------+--------+  Brachial 137                                                 +---------+------------------+-----+-------------------+--------+  PTA     83                0.61 dampened monophasic          +---------+------------------+-----+-------------------+--------+  DP      101  0.74 dampened monophasic          +---------+------------------+-----+-------------------+--------+  Great Toe58                0.42 Normal                       +---------+------------------+-----+-------------------+--------+   +---------+------------------+-----+-------------------+-------+  Left    Lt Pressure (mmHg)IndexWaveform           Comment  +---------+------------------+-----+-------------------+-------+  Brachial 137                                                +---------+------------------+-----+-------------------+-------+  PTA     78                0.57 dampened monophasic         +---------+------------------+-----+-------------------+-------+  DP      120               0.88 dampened monophasic         +---------+------------------+-----+-------------------+-------+  Great Toe38                0.28 Abnormal                    +---------+------------------+-----+-------------------+-------+   CMP reviewed, creatinine 1.1  Lipid panel reviewed      Assessment/Plan:   PASCUAL MANTEL is a 78 y.o. male with CL TI of his left lower extremity with left foot wounds.  We discussed natural history as well as the risk factors of PAD.  I explained best medical management plan also discussed that he is currently at a high risk of amputation in the future without intervention given his severely diminished toe pressure on the left of  38 and the presence of foot wounds.  I discussed the risk and benefits of angiogram, he expressed understanding and elected to proceed.  He also has a mild element of chronic venous insufficiency and we discussed the foundation of treatment being compression and elevation.  Encourage smoking cessation Continue aspirin , statin  Loi E Hobin has atherosclerosis of the native arteries of the Bilateral lower extremities causing ulceration. The patient is on best medical therapy for peripheral arterial disease. The patient has been counseled about the risks of tobacco use in atherosclerotic disease. The patient has been counseled to abstain from any tobacco use. An aortogram with bilateral lower extremity runoff angiography and Left lower extremity intervention and is indicated to better evaluate the patient's lower extremity circulation because of the limb threatening nature of the patient's diagnosis. Based on the patient's clinical exam and non-invasive data, we anticipate an endovascular intervention in the femoropopliteal and tibial vessels. Stenting and/or athrectomy would be favored because of the improved primary patency of these interventions as compared to plain balloon angioplasty.   Recommendations to optimize cardiovascular risk: Abstinence from all tobacco products. Blood glucose control with goal A1c < 7%. Blood pressure control with goal blood pressure < 140/90 mmHg. Lipid reduction therapy with goal LDL-C <100 mg/dL  Aspirin  81mg  PO QD.  Atorvastatin 40-80mg  PO QD (or other "high intensity" statin therapy).   Philipp Brawn MD [facility]

## 2024-02-17 ENCOUNTER — Ambulatory Visit: Attending: Vascular Surgery | Admitting: Vascular Surgery

## 2024-02-17 ENCOUNTER — Ambulatory Visit (HOSPITAL_COMMUNITY)
Admission: RE | Admit: 2024-02-17 | Discharge: 2024-02-17 | Disposition: A | Discharge status: Home / Self Care | Source: Ambulatory Visit | Attending: Vascular Surgery | Admitting: Vascular Surgery

## 2024-02-17 ENCOUNTER — Encounter: Payer: Self-pay | Admitting: Vascular Surgery

## 2024-02-17 VITALS — BP 117/67 | HR 74 | Temp 99.0°F | Resp 22 | Ht 78.0 in | Wt 190.8 lb

## 2024-02-17 DIAGNOSIS — I70222 Atherosclerosis of native arteries of extremities with rest pain, left leg: Secondary | ICD-10-CM

## 2024-02-17 DIAGNOSIS — I739 Peripheral vascular disease, unspecified: Secondary | ICD-10-CM

## 2024-02-17 LAB — VAS US ABI WITH/WO TBI
Left ABI: 0.88
Right ABI: 0.74

## 2024-02-18 ENCOUNTER — Telehealth: Payer: Self-pay | Admitting: Orthopaedic Surgery

## 2024-02-20 ENCOUNTER — Telehealth: Payer: Self-pay

## 2024-02-20 NOTE — Telephone Encounter (Signed)
 Larry Reese called wanting you to give her a return call at [phone]. Stated that they can't reach patient to schedule and the soonest they can schedule him would be Monday 6/9.

## 2024-02-20 NOTE — Telephone Encounter (Signed)
 Attempted to call for surgery scheduling. No answer / No VM.

## 2024-02-20 NOTE — Telephone Encounter (Signed)
 Attempted to call for surgery scheduling. Cell phone VM full - unable to leave message

## 2024-02-20 NOTE — Telephone Encounter (Signed)
 I called pt and he states the vascular team wasn't going to schedule him until later on like weeks out.  I called the vasccular team and left message to return myc all in regarding to see wen we can get pt in ASAP.

## 2024-02-21 DIAGNOSIS — H524 Presbyopia: Secondary | ICD-10-CM | POA: Diagnosis not present

## 2024-02-21 DIAGNOSIS — H2513 Age-related nuclear cataract, bilateral: Secondary | ICD-10-CM | POA: Diagnosis not present

## 2024-02-22 ENCOUNTER — Telehealth: Payer: Self-pay

## 2024-02-22 ENCOUNTER — Other Ambulatory Visit: Payer: Self-pay

## 2024-02-22 DIAGNOSIS — I70222 Atherosclerosis of native arteries of extremities with rest pain, left leg: Secondary | ICD-10-CM

## 2024-02-22 NOTE — Telephone Encounter (Signed)
 Hydrocodone -Acetaminophen  7.5/325 MG Qty 56 Tablets  PATIENT USES [facility]

## 2024-02-23 NOTE — Telephone Encounter (Signed)
 Visits has been completed

## 2024-02-27 ENCOUNTER — Other Ambulatory Visit: Payer: Self-pay

## 2024-02-27 ENCOUNTER — Ambulatory Visit (HOSPITAL_COMMUNITY)
Admission: RE | Admit: 2024-02-27 | Discharge: 2024-02-27 | Disposition: A | Discharge status: Home / Self Care | Attending: Vascular Surgery | Admitting: Vascular Surgery

## 2024-02-27 ENCOUNTER — Encounter (HOSPITAL_COMMUNITY): Payer: Self-pay | Admitting: Vascular Surgery

## 2024-02-27 ENCOUNTER — Encounter (HOSPITAL_COMMUNITY)
Admission: RE | Disposition: A | Discharge status: Home / Self Care | Payer: Self-pay | Source: Home / Self Care | Attending: Vascular Surgery

## 2024-02-27 DIAGNOSIS — Z7982 Long term (current) use of aspirin: Secondary | ICD-10-CM | POA: Insufficient documentation

## 2024-02-27 DIAGNOSIS — I4891 Unspecified atrial fibrillation: Secondary | ICD-10-CM | POA: Diagnosis not present

## 2024-02-27 DIAGNOSIS — L97529 Non-pressure chronic ulcer of other part of left foot with unspecified severity: Secondary | ICD-10-CM | POA: Diagnosis not present

## 2024-02-27 DIAGNOSIS — I771 Stricture of artery: Secondary | ICD-10-CM

## 2024-02-27 DIAGNOSIS — Z79899 Other long term (current) drug therapy: Secondary | ICD-10-CM | POA: Insufficient documentation

## 2024-02-27 DIAGNOSIS — F17210 Nicotine dependence, cigarettes, uncomplicated: Secondary | ICD-10-CM | POA: Diagnosis not present

## 2024-02-27 DIAGNOSIS — I70245 Atherosclerosis of native arteries of left leg with ulceration of other part of foot: Secondary | ICD-10-CM | POA: Diagnosis not present

## 2024-02-27 DIAGNOSIS — I70222 Atherosclerosis of native arteries of extremities with rest pain, left leg: Secondary | ICD-10-CM

## 2024-02-27 HISTORY — PX: LOWER EXTREMITY INTERVENTION: CATH118252

## 2024-02-27 HISTORY — PX: ABDOMINAL AORTOGRAM W/LOWER EXTREMITY: CATH118223

## 2024-02-27 LAB — POCT ACTIVATED CLOTTING TIME
Activated Clotting Time: 204 s
Activated Clotting Time: 176 s

## 2024-02-27 LAB — POCT I-STAT, CHEM 8
BUN: 10 mg/dL (ref 8–23)
Calcium, Ion: 1.16 mmol/L (ref 1.15–1.40)
Chloride: 102 mmol/L (ref 98–111)
Creatinine, Ser: 1 mg/dL (ref 0.61–1.24)
Glucose, Bld: 103 mg/dL — ABNORMAL HIGH (ref 70–99)
HCT: 39 % (ref 39.0–52.0)
Hemoglobin: 13.3 g/dL (ref 13.0–17.0)
Potassium: 3.8 mmol/L (ref 3.5–5.1)
Sodium: 137 mmol/L (ref 135–145)
TCO2: 23 mmol/L (ref 22–32)

## 2024-02-27 SURGERY — ABDOMINAL AORTOGRAM W/LOWER EXTREMITY
Anesthesia: LOCAL

## 2024-02-27 MED ORDER — CLOPIDOGREL BISULFATE 300 MG PO TABS
ORAL_TABLET | ORAL | Status: DC | PRN
  Administered 2024-02-27: 300 mg via ORAL

## 2024-02-27 MED ORDER — HYDRALAZINE HCL 20 MG/ML IJ SOLN: 5.0000 mg | INTRAMUSCULAR | Status: DC | PRN

## 2024-02-27 MED ORDER — FENTANYL CITRATE (PF) 100 MCG/2ML IJ SOLN
INTRAMUSCULAR | Status: AC
  Filled 2024-02-27: qty 2

## 2024-02-27 MED ORDER — IODIXANOL 320 MG/ML IV SOLN
INTRAVENOUS | Status: DC | PRN
Start: 2024-02-27 — End: 2024-02-27
  Administered 2024-02-27: 80 mL via INTRA_ARTERIAL

## 2024-02-27 MED ORDER — HEPARIN SODIUM (PORCINE) 1000 UNIT/ML IJ SOLN
INTRAMUSCULAR | Status: DC | PRN
Start: 2024-02-27 — End: 2024-02-27
  Administered 2024-02-27: 9000 [IU] via INTRAVENOUS
  Administered 2024-02-27 (×2): 2000 [IU] via INTRAVENOUS
  Administered 2024-02-27: 4000 [IU] via INTRAVENOUS

## 2024-02-27 MED ORDER — ASPIRIN 81 MG PO CHEW
CHEWABLE_TABLET | ORAL | Status: AC
  Filled 2024-02-27: qty 1

## 2024-02-27 MED ORDER — MIDAZOLAM HCL 2 MG/2ML IJ SOLN
INTRAMUSCULAR | Status: AC
  Filled 2024-02-27: qty 2

## 2024-02-27 MED ORDER — LIDOCAINE HCL (PF) 1 % IJ SOLN
INTRAMUSCULAR | Status: DC | PRN
Start: 2024-02-27 — End: 2024-02-27
  Administered 2024-02-27: 10 mL
  Administered 2024-02-27: 2 mL

## 2024-02-27 MED ORDER — ONDANSETRON HCL 4 MG/2ML IJ SOLN: 4.0000 mg | Freq: Four times a day (QID) | INTRAMUSCULAR | Status: DC | PRN

## 2024-02-27 MED ORDER — HEPARIN (PORCINE) IN NACL 1000-0.9 UT/500ML-% IV SOLN
INTRAVENOUS | Status: DC | PRN
  Administered 2024-02-27 (×2): 500 mL

## 2024-02-27 MED ORDER — MIDAZOLAM HCL 2 MG/2ML IJ SOLN
INTRAMUSCULAR | Status: DC | PRN
Start: 2024-02-27 — End: 2024-02-27
  Administered 2024-02-27 (×2): 1 mg via INTRAVENOUS

## 2024-02-27 MED ORDER — ACETAMINOPHEN 325 MG PO TABS
650.0000 mg | ORAL_TABLET | ORAL | Status: DC | PRN
Start: 2024-02-27 — End: 2024-02-27

## 2024-02-27 MED ORDER — SODIUM CHLORIDE 0.9% FLUSH: 3.0000 mL | INTRAVENOUS | Status: DC | PRN

## 2024-02-27 MED ORDER — CLOPIDOGREL BISULFATE 75 MG PO TABS: 75.0000 mg | ORAL_TABLET | Freq: Every day | ORAL | 11 refills | Status: DC

## 2024-02-27 MED ORDER — CLOPIDOGREL BISULFATE 300 MG PO TABS
ORAL_TABLET | ORAL | Status: AC
  Filled 2024-02-27: qty 1

## 2024-02-27 MED ORDER — HEPARIN SODIUM (PORCINE) 1000 UNIT/ML IJ SOLN
INTRAMUSCULAR | Status: AC
  Filled 2024-02-27: qty 10

## 2024-02-27 MED ORDER — SODIUM CHLORIDE 0.9 % IV SOLN: 250.0000 mL | INTRAVENOUS | Status: DC | PRN

## 2024-02-27 MED ORDER — SODIUM CHLORIDE 0.9% FLUSH
3.0000 mL | Freq: Two times a day (BID) | INTRAVENOUS | Status: DC
Start: 2024-02-28 — End: 2024-02-27

## 2024-02-27 MED ORDER — ASPIRIN 81 MG PO CHEW
CHEWABLE_TABLET | ORAL | Status: DC | PRN
  Administered 2024-02-27: 81 mg via ORAL

## 2024-02-27 MED ORDER — SODIUM CHLORIDE 0.9 % IV SOLN: INTRAVENOUS | Status: DC

## 2024-02-27 MED ORDER — LABETALOL HCL 5 MG/ML IV SOLN: 10.0000 mg | INTRAVENOUS | Status: DC | PRN

## 2024-02-27 MED ORDER — FENTANYL CITRATE (PF) 100 MCG/2ML IJ SOLN
INTRAMUSCULAR | Status: DC | PRN
  Administered 2024-02-27 (×4): 50 ug via INTRAVENOUS

## 2024-02-27 MED ORDER — SODIUM CHLORIDE 0.9 % WEIGHT BASED INFUSION: 1.0000 mL/kg/h | INTRAVENOUS | Status: DC

## 2024-02-27 MED ORDER — LIDOCAINE HCL (PF) 1 % IJ SOLN
INTRAMUSCULAR | Status: AC
  Filled 2024-02-27: qty 30

## 2024-02-27 SURGICAL SUPPLY — 36 items
BALLOON COYOTE OTW 3.5X150X150 (BALLOONS) IMPLANT
BALLOON COYOTE OTW 3X220X150 (BALLOONS) IMPLANT
BALLOON MUSTANG 5.0X40 135 (BALLOONS) IMPLANT
BALLOON STERLING OTW 3X220X150 (BALLOONS) IMPLANT
CATH CXI 4F 150 DAV (CATHETERS) IMPLANT
CATH CXI SUPP 2.6F 150 ST (CATHETERS) IMPLANT
CATH OMNI FLUSH 5F 65CM (CATHETERS) IMPLANT
CATH QUICKCROSS .035X135CM (MICROCATHETER) IMPLANT
CATH RUBICON 014 150 (CATHETERS) IMPLANT
CATH SHOCKWAVE E8 3X80 (CATHETERS) IMPLANT
CATH SPRT ANG 90X2.3FR ACPT (CATHETERS) IMPLANT
CLOSURE MYNX CONTROL 6F/7F (Vascular Products) IMPLANT
CLOSURE PERCLOSE PROSTYLE (VASCULAR PRODUCTS) IMPLANT
GLIDEWIRE ADV .035X260CM (WIRE) IMPLANT
GUIDEWIRE REGALIA .014X300CM (WIRE) IMPLANT
KIT ENCORE 26 ADVANTAGE (KITS) IMPLANT
KIT MICROPUNCTURE NIT STIFF (SHEATH) IMPLANT
KIT PV (KITS) ×2 IMPLANT
KIT SINGLE USE MANIFOLD (KITS) IMPLANT
PAD HEMO NEPTUNE PLUS 2X2 (PAD) IMPLANT
SET ATX-X65L (MISCELLANEOUS) IMPLANT
SHEATH CATAPULT 6FR 60 (SHEATH) IMPLANT
SHEATH GLIDE SLENDER 4/5FR (SHEATH) IMPLANT
SHEATH MICROPUNCTURE PEDAL 5FR (SHEATH) IMPLANT
SHEATH PINNACLE 5F 10CM (SHEATH) IMPLANT
SHEATH PINNACLE 6F 10CM (SHEATH) IMPLANT
SHEATH PROBE COVER 6X72 (BAG) IMPLANT
SYR MEDRAD MARK 7 150ML (SYRINGE) ×2 IMPLANT
TRANSDUCER W/STOPCOCK (MISCELLANEOUS) ×2 IMPLANT
TRAY PV CATH (CUSTOM PROCEDURE TRAY) ×2 IMPLANT
WIRE BENTSON .035X145CM (WIRE) IMPLANT
WIRE COMMAND ST ANG 014 300 (WIRE) IMPLANT
WIRE G V18X300CM (WIRE) IMPLANT
WIRE HI TORQ COMMND ES.014X300 (WIRE) IMPLANT
WIRE SHEPHERD 12G .014 (WIRE) IMPLANT
WIRE SHEPHERD 6G .0144 (WIRE) IMPLANT

## 2024-02-27 NOTE — Progress Notes (Signed)
Patient and wife was given discharge instructions. Both verbalize understanding.

## 2024-02-27 NOTE — Op Note (Signed)
 Patient name: Larry Reese MRN: [id number] DOB: Oct 02, 1945 Sex: male  02/27/2024 Pre-operative Diagnosis: CLTI with rest pain and tissue loss, LLE Post-operative diagnosis:  Same Surgeon:  Philipp Brawn, MD Procedure Performed:  Ultrasound-guided access of right common femoral artery Ultrasound-guided access of the left DP Third order cannulation of left AT Retrograde third artery cannulation of left SFA Balloon angioplasty of left popliteal artery, 5 mm x 40 mm Mustang Shockwave intravascular lithotripsy of left AT, 3 mm E8 balloon Balloon angioplasty of left AT with 3 mm and 3.5 mm coyote balloons Mynx closure of right common femoral artery 198 minutes of moderate sedation with fentanyl  and Versed     Indications: Larry Reese is a 78 year old male who presented to the office for evaluation of toe blisters.  He also reported wrist pain over the last few weeks.  Noninvasive vascular labs demonstrated a decreased ABI with a toe pressure of 38 on the right.  Risks and benefits of angiogram with intervention were reviewed, he expressed understanding and elects to proceed.  Findings:  Widely patent aorta and bilateral renal arteries.  Widely patent iliac system bilaterally although significantly tortuous causing difficulty tracking the initial catheter as well as the sheath.  Widely patent left common femoral, profunda and SFA.  Widely patent popliteal artery.  Severe tibial disease.  The PT and peroneal occlude shortly after their takeoff and does not reconstitute.  The AT occludes just after the turn, there are islands of reconstitution although there is severe calcific disease.  The DP reconstitutes on the foot.   Procedure:  The patient was identified in the holding area and taken to the cath lab  The patient was then placed supine on the table and prepped and draped in the usual sterile fashion.  A time out was called.  Ultrasound was used to evaluate the right common femoral  artery.  It was patent .  A digital ultrasound image was acquired.  A micropuncture needle was used to access the right common femoral artery under ultrasound guidance.  An 018 wire was advanced without resistance and a micropuncture sheath was placed.  The 018 wire was removed and a benson wire was placed.  The micropuncture sheath was exchanged for a 5 french sheath.  An omniflush catheter was advanced over the wire to the level of L-1.  An abdominal angiogram was obtained.  Next, using the omniflush catheter and a glide advantage wire, the aortic bifurcation was crossed and the catheter was placed into theleft external iliac artery and left runoff was obtained. This demonstrated the above findings.  The glide advantage wire was replaced through the catheter and into the SFA.  The patient was then systemically heparinized and the short 5 French sheath was exchanged for a 6 [place] by 60 cm catapult.  Using a command 014 wire and a CXI catheter I was able to cannulate the anterior tibial artery although this wire and catheter system would not push through the occlusion and found a dissection while attempting to cross the occlusion proximally.  I then attempted with a V18 wire although this was also a dissection plane and tracked distally.  The left foot was then prepped and draped and the left dorsalis pedis artery was accessed under ultrasound guidance.  The micro sheath was placed and using a V18 and a CXI catheter I was able to track up to the proximal AT although it this wire and catheter system also found a dissection plane retrograde and would  not track into the popliteal artery.  I attempted multiple different wiring catheter combinations including weighted tip 014 wires, with Rubicon and CXI catheters but was unable to track into the true lumen.  From the groin access site then passed a wire down into the BK pop and a 5 mm Mustang balloon was inflated with hopes that I could reenter the true lumen by popping  the balloon from the AT wire although this did not work.  I then attempted 1 more time from above using an 035 glide advantage wire.  This did track through the proximal stenosis and into the distal AT.  A quick cross or CXI catheter would not track over this wire and into the foot, it only made it to about halfway down the AT.  The catheter was left in place and an 014 wire was placed through through this catheter that was halfway down the AT and this passed easily into the DP on the foot.  I then used the shockwave E8 balloon to perform intravascular lithotripsy of the anterior tibial artery.  It should be noted that only proximal one half of the artery was treated as the balloon would not track any further.  The balloon unfortunately got stuck on the wire and while attempting to remove the shockwave balloon the 014 wire was also pulled and access was lost.  I then attempted to pass a wire from the pedal access which did track through the true lumen and into the below-knee popliteal artery.  Balloon still would not track over this wire across the severe stenosis in the distal AT.  Using an 014 wire and a CXI catheter this retrograde wire was tracked into the proximal SFA and that the up and over sheath was cannulated.  This wire was passed through the up and over sheath and we obtained through and through access.  Using a 3 mm coyote balloon the entirety of the AT was ballooned.  There was still significant residual stenosis therefore they wanted to pass the shockwave and were required through and through wire from the pedal access.  The pedal sheath was upsized to a 4 5 slender sheath and shockwave balloon was attempted to pass over the through and through wire but this balloon would not track past the severe stenosis in the distal AT.  Given that a coated balloon did tracked through originally I used a 3.5 mm coyote balloon to balloon angioplasty the entirety of the AT, this did track past the stenosis and  angioplasty was performed and the multifocal severe stenosis of the AT to give with this balloon.  Completion angiography demonstrated an excellent result with inline flow to the foot via the AT and did fill the DP on the foot.  The through and through wire was removed.  The long 6 French sheath was removed over a Bentson wire and its dilator and a short 6 French sheath was placed into the right common femoral artery access.  A minx closure device was then deployed with excellent hemostasis.  The pedal sheath was removed and manual pressure was held.  Contrast: 80 cc Sedation: 198 minutes  Impression: Excellent result of shockwave intravascular lithotripsy and balloon angioplasty of the right AT, now with inline flow to the foot via the right AT filling and TP on the foot.   Philipp Brawn MD [facility] Office: [phone]

## 2024-02-27 NOTE — Interval H&P Note (Signed)
 History and Physical Interval Note:  02/27/2024 10:34 AM  Larry Reese  has presented today for surgery, with the diagnosis of ischemia.  The various methods of treatment have been discussed with the patient and family. After consideration of risks, benefits and other options for treatment, the patient has consented to  Procedure(s): ABDOMINAL AORTOGRAM W/LOWER EXTREMITY (N/A) as a surgical intervention.  The patient's history has been reviewed, patient examined, no change in status, stable for surgery.  I have reviewed the patient's chart and labs.  Questions were answered to the patient's satisfaction.     Philipp Brawn

## 2024-02-29 ENCOUNTER — Ambulatory Visit: Admitting: Orthopaedic Surgery

## 2024-03-02 DIAGNOSIS — I4821 Permanent atrial fibrillation: Secondary | ICD-10-CM | POA: Diagnosis not present

## 2024-03-02 DIAGNOSIS — I10 Essential (primary) hypertension: Secondary | ICD-10-CM | POA: Diagnosis not present

## 2024-03-26 ENCOUNTER — Telehealth: Payer: Self-pay | Admitting: Orthopaedic Surgery

## 2024-04-01 DIAGNOSIS — I4821 Permanent atrial fibrillation: Secondary | ICD-10-CM | POA: Diagnosis not present

## 2024-04-01 DIAGNOSIS — I10 Essential (primary) hypertension: Secondary | ICD-10-CM | POA: Diagnosis not present

## 2024-04-03 ENCOUNTER — Other Ambulatory Visit: Payer: Self-pay

## 2024-04-03 DIAGNOSIS — I739 Peripheral vascular disease, unspecified: Secondary | ICD-10-CM

## 2024-04-12 NOTE — Progress Notes (Unsigned)
 Patient ID: Larry Reese, male   DOB: 1945/11/29, 78 y.o.   MRN: [id number]  Reason for Consult: No chief complaint on file.   Referred by Carlette San Demissie*  Subjective:     HPI Larry Reese is a 78 y.o. male presenting for follow-up.  He underwent angiogram with shockwave lithotripsy and balloon angioplasty of the left AT and popliteal artery on 02/27/2024 for CLTI with rest pain and tissue loss. Today he reports his blisters have improved.  He does have some foot swelling although denies pain.  He does not walk much and he continues to smoke about a pack every 3 days He continues to be compliant with dual antiplatelet, aspirin  Plavix  and statin  Past Medical History:  Diagnosis Date   A-fib (HCC)    Anemia    Arthritis    Bradycardia    Dilatation of thoracic aorta (HCC)    dilated aortic root and ascending aorta   Dupuytren's contracture of right hand    Hx of adenomatous colonic polyps    Hypertension    Peripheral vascular disease (HCC)    Pneumothorax 09/18/1998   spontaneous right PTX 09/18/98, s/p chest tube   SDH (subdural hematoma) (HCC) 10/27/2004   left chronic SDH versus hygroma 10/27/04 CT   Stroke [facility]) 2005   left sided weakness   Trigger finger    Family History  Problem Relation Age of Onset   Esophageal cancer Father    Colon cancer Neg Hx    Past Surgical History:  Procedure Laterality Date   ABDOMINAL AORTOGRAM W/LOWER EXTREMITY N/A 02/27/2024   Procedure: ABDOMINAL AORTOGRAM W/LOWER EXTREMITY;  Surgeon: Pearline Norman RAMAN, MD;  Location: [facility] INVASIVE CV LAB;  Service: Cardiovascular;  Laterality: N/A;   COLONOSCOPY  May 2010   Dr. fields: 5 cm terminal ileum normal, 6 polyps removed, moderate internal hemorrhoids, simple adenomas, next colonoscopy May 2015   COLONOSCOPY N/A 08/01/2015   Procedure: COLONOSCOPY;  Surgeon: Margo LITTIE Haddock, MD;  Location: [facility]ITE;  Service: Endoscopy;  Laterality: N/A;  0830   CRANIOTOMY  2000    pressure   INGUINAL HERNIA REPAIR Right    INGUINAL HERNIA REPAIR Left 02/09/2022   Procedure: OPEN LEFT INGUINAL HERNIA REPAIR;  Surgeon: Rubin Calamity, MD;  Location: [facility] OR;  Service: General;  Laterality: Left;   INSERTION OF MESH Left 02/09/2022   Procedure: INSERTION OF MESH;  Surgeon: Rubin Calamity, MD;  Location[facility];  Service: General;  Laterality: Left;   LEFT ATRIAL APPENDAGE OCCLUSION N/A 09/04/2020   Procedure: LEFT ATRIAL APPENDAGE OCCLUSION;  Surgeon: Cindie Ole DASEN, MD;  Location: [facility] INVASIVE CV LAB;  Service: Cardiovascular;  Laterality: N/A;   LOWER EXTREMITY INTERVENTION  02/27/2024   Procedure: LOWER EXTREMITY INTERVENTION;  Surgeon: Pearline Norman RAMAN, MD;  Location: [facility] INVASIVE CV LAB;  Service: Cardiovascular;;   OLECRANON BURSECTOMY Right 02/11/2015   Procedure: EXCISION RIGHT OLECRANON BURSA;  Surgeon: Lemond Stable, MD;  Location: [facility] ORS;  Service: Orthopedics;  Laterality: Right;   repair of trigger finger Left    pinky   TEE WITHOUT CARDIOVERSION N/A 09/04/2020   Procedure: TRANSESOPHAGEAL ECHOCARDIOGRAM (TEE);  Surgeon: Cindie Ole DASEN, MD;  Location: [facility] INVASIVE CV LAB;  Service: Cardiovascular;  Laterality: N/A;   TEE WITHOUT CARDIOVERSION N/A 10/13/2020   Procedure: TRANSESOPHAGEAL ECHOCARDIOGRAM (TEE);  Surgeon: Barbaraann Darryle Ned, MD;  Location: [facility] ENDOSCOPY;  Service: Cardiovascular;  Laterality: N/A;   TOTAL HIP ARTHROPLASTY Right 10/19/2021   Procedure: RIGHT  TOTAL HIP ARTHROPLASTY ANTERIOR APPROACH;  Surgeon: Jerri Kay HERO, MD;  Location: [facility] OR;  Service: Orthopedics;  Laterality: Right;  3-C    Short Social History:  Social History   Tobacco Use   Smoking status: Every Day    Current packs/day: 0.25    Average packs/day: 0.3 packs/day for 50.0 years (12.5 ttl pk-yrs)    Types: Cigarettes   Smokeless tobacco: Never   Tobacco comments:    5-6 cigarettes per day  Substance Use Topics   Alcohol  use: Not Currently    Allergies  Allergen  Reactions   Aleve [Naproxen] Other (See Comments)    Numbness in extremities    Celebrex [Celecoxib] Other (See Comments)    Celebrex. Nose bleed.   Fluoxetine Hcl Swelling    Current Outpatient Medications  Medication Sig Dispense Refill   HYDROcodone -acetaminophen  (NORCO ) 7.5-325 MG tablet Take 1 tablet by mouth every 6 (six) hours as needed for moderate pain (pain score 4-6). 56 tablet 0   amLODipine  (NORVASC ) 10 MG tablet Take 10 mg by mouth in the morning.     amoxicillin  (AMOXIL ) 500 MG capsule Take 4 pills one hour prior to dental work 8 capsule 2   clopidogrel  (PLAVIX ) 75 MG tablet Take 1 tablet (75 mg total) by mouth daily. 30 tablet 11   diclofenac  Sodium (VOLTAREN  ARTHRITIS PAIN) 1 % GEL Apply 2 g topically daily as needed (pain).     diphenhydrAMINE  (BENADRYL ) 25 MG tablet Take 25 mg by mouth daily.     docusate sodium  (COLACE) 100 MG capsule Take 100 mg by mouth 2 (two) times daily.     rosuvastatin  (CRESTOR ) 10 MG tablet TAKE 1 TABLET EVERY DAY 90 tablet 1   tamsulosin  (FLOMAX ) 0.4 MG CAPS capsule Take 0.4 mg by mouth daily.      traZODone (DESYREL) 100 MG tablet Take 100 mg by mouth at bedtime.     No current facility-administered medications for this visit.    REVIEW OF SYSTEMS  All other systems were reviewed and are negative     Objective:  Objective   There were no vitals filed for this visit. There is no height or weight on file to calculate BMI.  Physical Exam General: no acute distress Cardiac: hemodynamically stable Pulm: normal work of breathing Neuro: alert, no focal deficit Extremities: no edema, cyanosis or wounds.  Previous sores/blisters on toes have healed Vascular:   Right: Nonpalpable pedal's  Left: 1+ DP  Data: ABI ABI Findings:  +---------+------------------+-----+----------+--------+  Right   Rt Pressure (mmHg)IndexWaveform  Comment   +---------+------------------+-----+----------+--------+  Brachial 151                                         +---------+------------------+-----+----------+--------+  ATA     106               0.70 monophasic          +---------+------------------+-----+----------+--------+  PTA     99                0.66 monophasic          +---------+------------------+-----+----------+--------+  Great Toe59                0.39                     +---------+------------------+-----+----------+--------+   +---------+------------------+-----+----------+-------+  Left  Lt Pressure (mmHg)IndexWaveform  Comment  +---------+------------------+-----+----------+-------+  Brachial 142                                       +---------+------------------+-----+----------+-------+  ATA     237               1.57 monophasic         +---------+------------------+-----+----------+-------+  PTA     79                0.52 monophasic         +---------+------------------+-----+----------+-------+  Great Toe75                0.50                    +---------+------------------+-----+----------+-------+   +-------+-----------+-----------+------------+------------+  ABI/TBIToday's ABIToday's TBIPrevious ABIPrevious TBI  +-------+-----------+-----------+------------+------------+  Right 0.70       0.39       0.74        0.42          +-------+-----------+-----------+------------+------------+  Left  1.57       0.50       0.88        0.27          +-------+-----------+-----------+------------+------------+   Left leg duplex +-----------+--------+-----+---------------+----------+--------------------  ---+  LEFT      PSV cm/sRatioStenosis       Waveform  Comments                  +-----------+--------+-----+---------------+----------+--------------------  ---+  CFA Distal 72                          biphasic                            +-----------+--------+-----+---------------+----------+--------------------  ---+   DFA       94                          biphasic                            +-----------+--------+-----+---------------+----------+--------------------  ---+  SFA Prox   74                          biphasic                            +-----------+--------+-----+---------------+----------+--------------------  ---+  SFA Mid    71                          biphasic                            +-----------+--------+-----+---------------+----------+--------------------  ---+  SFA Distal 84                          triphasic                           +-----------+--------+-----+---------------+----------+--------------------  ---+  POP Prox   86  biphasic                            +-----------+--------+-----+---------------+----------+--------------------  ---+  POP Distal 138                         biphasic                            +-----------+--------+-----+---------------+----------+--------------------  ---+  TP Trunk   75                          triphasic                           +-----------+--------+-----+---------------+----------+--------------------  ---+  ATA Prox   212          50-74% stenosis                                    +-----------+--------+-----+---------------+----------+--------------------  ---+  ATA Mid    466          75-99% stenosismonophasicMid-distal segment        +-----------+--------+-----+---------------+----------+--------------------  ---+  ATA Distal 88                          monophasic                          +-----------+--------+-----+---------------+----------+--------------------  ---+  PTA Prox   47                          biphasic                            +-----------+--------+-----+---------------+----------+--------------------  ---+  PTA Mid    18                          monophasic                           +-----------+--------+-----+---------------+----------+--------------------  ---+  PTA Distal 31                          monophasic                          +-----------+--------+-----+---------------+----------+--------------------  ---+  PERO Mid                                         No discernable  waveform  +-----------+--------+-----+---------------+----------+--------------------  ---+  PERO Distal                                      No discernable  waveform  +-----------+--------+-----+---------------+----------+--------------------  ---+     Summary:  Left: Total occlusion noted in the peroneal artery. Tandem left anterior  tibial artery stenoses, greatest being 75-99% stenosis at  the mid-distal  segment.      Assessment/Plan:   Larry Reese is a 78 y.o. male with CL TI of his left lower extremity who underwent shockwave lithotripsy and balloon angioplasty of the left AT and popliteal artery on 03/09/2024.  His wounds are healing and his rest pain is resolved. His studies today demonstrate improvement in his ABI and toe pressure on the left.  I explained that he still has some stenosis through his AT although this was a very difficult angiogram with greater than 3 hours of work.  He also has a 1+ palpable DP on the left and his wounds of healed. I strongly encouraged smoking cessation and explained that he is at very high risk of amputation should he continue to smoke. Plan to continue aspirin  and Plavix , Plavix  was refilled and will have short interval follow-up.  Plan for follow-up in 3 months with repeat ABI and left leg duplex Continue dual antiplatelet indefinitely   Norman GORMAN Serve MD [facility]

## 2024-04-13 ENCOUNTER — Encounter: Payer: Self-pay | Admitting: Vascular Surgery

## 2024-04-13 ENCOUNTER — Ambulatory Visit (HOSPITAL_COMMUNITY)
Admit: 2024-04-13 | Discharge: 2024-04-13 | Disposition: A | Discharge status: Home / Self Care | Attending: Vascular Surgery | Admitting: Vascular Surgery

## 2024-04-13 ENCOUNTER — Ambulatory Visit (HOSPITAL_COMMUNITY)
Admission: RE | Admit: 2024-04-13 | Discharge: 2024-04-13 | Disposition: A | Discharge status: Home / Self Care | Source: Ambulatory Visit | Attending: Vascular Surgery | Admitting: Vascular Surgery

## 2024-04-13 ENCOUNTER — Other Ambulatory Visit: Payer: Self-pay

## 2024-04-13 ENCOUNTER — Ambulatory Visit: Attending: Vascular Surgery | Admitting: Vascular Surgery

## 2024-04-13 VITALS — BP 138/62 | HR 46 | Temp 97.9°F | Resp 18 | Ht 78.5 in | Wt 186.4 lb

## 2024-04-13 DIAGNOSIS — I70222 Atherosclerosis of native arteries of extremities with rest pain, left leg: Secondary | ICD-10-CM | POA: Diagnosis not present

## 2024-04-13 DIAGNOSIS — I739 Peripheral vascular disease, unspecified: Secondary | ICD-10-CM

## 2024-04-13 LAB — VAS US ABI WITH/WO TBI
Left ABI: 1.57
Right ABI: 0.7

## 2024-04-13 MED ORDER — CLOPIDOGREL BISULFATE 75 MG PO TABS: 75.0000 mg | ORAL_TABLET | Freq: Every day | ORAL | 3 refills | Status: AC

## 2024-05-02 DIAGNOSIS — I4821 Permanent atrial fibrillation: Secondary | ICD-10-CM | POA: Diagnosis not present

## 2024-05-02 DIAGNOSIS — I10 Essential (primary) hypertension: Secondary | ICD-10-CM | POA: Diagnosis not present

## 2024-05-09 ENCOUNTER — Telehealth: Payer: Self-pay | Admitting: Orthopaedic Surgery

## 2024-06-04 DIAGNOSIS — I719 Aortic aneurysm of unspecified site, without rupture: Secondary | ICD-10-CM | POA: Diagnosis not present

## 2024-06-04 DIAGNOSIS — I4821 Permanent atrial fibrillation: Secondary | ICD-10-CM | POA: Diagnosis not present

## 2024-06-04 DIAGNOSIS — Z0001 Encounter for general adult medical examination with abnormal findings: Secondary | ICD-10-CM | POA: Diagnosis not present

## 2024-06-04 DIAGNOSIS — N400 Enlarged prostate without lower urinary tract symptoms: Secondary | ICD-10-CM | POA: Diagnosis not present

## 2024-06-04 DIAGNOSIS — I10 Essential (primary) hypertension: Secondary | ICD-10-CM | POA: Diagnosis not present

## 2024-06-04 DIAGNOSIS — Z23 Encounter for immunization: Secondary | ICD-10-CM | POA: Diagnosis not present

## 2024-06-04 DIAGNOSIS — J439 Emphysema, unspecified: Secondary | ICD-10-CM | POA: Diagnosis not present

## 2024-06-20 ENCOUNTER — Telehealth: Payer: Self-pay | Admitting: Orthopaedic Surgery

## 2024-07-04 DIAGNOSIS — I10 Essential (primary) hypertension: Secondary | ICD-10-CM | POA: Diagnosis not present

## 2024-07-04 DIAGNOSIS — I4821 Permanent atrial fibrillation: Secondary | ICD-10-CM | POA: Diagnosis not present

## 2024-07-06 ENCOUNTER — Ambulatory Visit (HOSPITAL_COMMUNITY)

## 2024-07-06 ENCOUNTER — Ambulatory Visit: Admitting: Vascular Surgery

## 2024-07-06 ENCOUNTER — Ambulatory Visit (HOSPITAL_COMMUNITY): Admission: RE | Admit: 2024-07-06 | Source: Ambulatory Visit

## 2024-07-11 ENCOUNTER — Ambulatory Visit: Admitting: Vascular Surgery

## 2024-07-21 DIAGNOSIS — I2510 Atherosclerotic heart disease of native coronary artery without angina pectoris: Secondary | ICD-10-CM | POA: Diagnosis not present

## 2024-07-21 DIAGNOSIS — K5900 Constipation, unspecified: Secondary | ICD-10-CM | POA: Diagnosis not present

## 2024-07-21 DIAGNOSIS — I719 Aortic aneurysm of unspecified site, without rupture: Secondary | ICD-10-CM | POA: Diagnosis not present

## 2024-07-21 DIAGNOSIS — N400 Enlarged prostate without lower urinary tract symptoms: Secondary | ICD-10-CM | POA: Diagnosis not present

## 2024-07-21 DIAGNOSIS — M1990 Unspecified osteoarthritis, unspecified site: Secondary | ICD-10-CM | POA: Diagnosis not present

## 2024-07-21 DIAGNOSIS — E785 Hyperlipidemia, unspecified: Secondary | ICD-10-CM | POA: Diagnosis not present

## 2024-07-21 DIAGNOSIS — I4891 Unspecified atrial fibrillation: Secondary | ICD-10-CM | POA: Diagnosis not present

## 2024-07-21 DIAGNOSIS — I700 Atherosclerosis of aorta: Secondary | ICD-10-CM | POA: Diagnosis not present

## 2024-07-21 DIAGNOSIS — I70222 Atherosclerosis of native arteries of extremities with rest pain, left leg: Secondary | ICD-10-CM | POA: Diagnosis not present

## 2024-07-26 ENCOUNTER — Other Ambulatory Visit: Payer: Self-pay | Admitting: Orthopedic Surgery

## 2024-07-26 ENCOUNTER — Telehealth: Payer: Self-pay | Admitting: Orthopedic Surgery

## 2024-07-26 DIAGNOSIS — G894 Chronic pain syndrome: Secondary | ICD-10-CM

## 2024-07-26 MED ORDER — HYDROCODONE-ACETAMINOPHEN 7.5-325 MG PO TABS: 1.0000 | ORAL_TABLET | Freq: Four times a day (QID) | ORAL | 0 refills | Status: DC | PRN

## 2024-07-26 NOTE — Telephone Encounter (Signed)
 Called and spoke w/the pt's spouse and advised, she verbalized understanding.

## 2024-07-26 NOTE — Telephone Encounter (Signed)
 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 VEAR on 12/23/23, spoke w/his spouse, she stated that the pt needs a refill for Hydrocodone  7.5-325, 56 tablets, every 6 hours PRN for moderate pain (pain score 4-6) to be sent to [facility].  Ok to refill?  Need an appointment, if so, schedule as new or not since he last saw you?  [phone]

## 2024-07-26 NOTE — Progress Notes (Signed)
 Meds ordered this encounter  Medications   HYDROcodone -acetaminophen  (NORCO ) 7.5-325 MG tablet    Sig: Take 1 tablet by mouth every 6 (six) hours as needed for moderate pain (pain score 4-6).    Dispense:  56 tablet    Refill:  0   Dr. Brenna has retired, the patient will need to be referred to a pain management center as soon as possible    Instructions regarding chronic pain management   You have chronic pain.  You are on chronic opioid therapy.  You will be referred to a chronic pain chronic opioid therapy specialist.  You have 30 days from today to continue getting your medications from this office.  After 30 days you would no longer get prescriptions for opioids from [facility] [place].

## 2024-08-07 DIAGNOSIS — I4821 Permanent atrial fibrillation: Secondary | ICD-10-CM | POA: Diagnosis not present

## 2024-08-07 DIAGNOSIS — I10 Essential (primary) hypertension: Secondary | ICD-10-CM | POA: Diagnosis not present

## 2024-08-09 NOTE — Progress Notes (Signed)
 Patient ID: Larry Reese, male   DOB: August 23, 1946, 78 y.o.   MRN: [id number]  Reason for Consult: Follow-up   Referred by Carlette San Demissie*  Subjective:     HPI Larry Reese is a 78 y.o. male presenting for follow-up.  He underwent angiogram with shockwave lithotripsy and balloon angioplasty of the left AT and popliteal artery on 02/27/2024 for CLTI with rest pain and tissue loss. Today he reports he has been doing well.  He denies any pain in his feet or legs.  He reports that his blisters have completely healed. He continues to be compliant with dual antiplatelet, aspirin  Plavix  and statin  Past Medical History:  Diagnosis Date   A-fib (HCC)    Anemia    Arthritis    Bradycardia    Dilatation of thoracic aorta    dilated aortic root and ascending aorta   Dupuytren's contracture of right hand    Hx of adenomatous colonic polyps    Hypertension    Peripheral vascular disease    Pneumothorax 09/18/1998   spontaneous right PTX 09/18/98, s/p chest tube   SDH (subdural hematoma) (HCC) 10/27/2004   left chronic SDH versus hygroma 10/27/04 CT   Stroke [facility]) 2005   left sided weakness   Trigger finger    Family History  Problem Relation Age of Onset   Esophageal cancer Father    Colon cancer Neg Hx    Past Surgical History:  Procedure Laterality Date   ABDOMINAL AORTOGRAM W/LOWER EXTREMITY N/A 02/27/2024   Procedure: ABDOMINAL AORTOGRAM W/LOWER EXTREMITY;  Surgeon: Pearline Norman RAMAN, MD;  [facility]: [facility] INV[facility] LAB;  Service: Cardiovascular;  Laterality: N/A;   COLONOSCOPY  May 2010   Dr. fields: 5 cm terminal ileum normal, 6 polyps removed, moderate internal hemorrhoids, simple adenomas, next colonoscopy May 2015   COLONOSCOPY N/A 08/01/2015   Procedure: COLONOSCOPY;  Surgeon: Margo LITTIE Haddock, MD;  Location: [facility];  Service: Endoscopy;  Laterality: N/A;  0830   CRANIOTOMY  2000   pressure   INGUINAL HERNIA REPAIR Right    INGUINAL HERNIA REPAIR Left  02/09/2022   Procedure: OPEN LEFT INGUINAL HERNIA REPAIR;  Surgeon: Rubin Calamity, MD;  Location: [facility] OR;  Service: General;  Laterality: Left;   INSERTION OF MESH Left 02/09/2022   Procedure: INSERTION OF MESH;  Surgeon: Rubin Calamity, MD;  Location: [facility] OR;  Service: General;  Laterality: Left;   LEFT ATRIAL APPENDAGE OCCLUSION N/A 09/04/2020   Procedure: LEFT ATRIAL APPENDAGE OCCLUSION;  Surgeon: Cindie Ole DASEN, MD;  Location: [facility] INV[facility] CV LAB;  Service: Cardiovascular;  Laterality: N/A;   LOWER EXTREMITY INTERVENTION  02/27/2024   Procedure: LOWER EXTREMITY INTERVENTION;  Surgeon: Pearline Norman RAMAN, MD;  Location: [facility] INVASIVE CV LAB;  Service: Cardiovascular;;   OLECRANON BURSECTOMY Right 02/11/2015   Procedure: EXCISION RIGHT OLECRANON BURSA;  Surgeon: Lemond Stable, MD;  Location: [facility] ORS;  Service: Orthopedics;  Laterality: Right;   repair of trigger finger Left    pinky   TEE WITHOUT CARDIOVERSION N/A 09/04/2020   Procedure: TRANSESOPHAGEAL ECHOCARDIOGRAM (TEE);  Surgeon: Cindie Ole DASEN, MD;  Location: [facility] INVASIVE CV LAB;  Service: Cardiovascular;  Laterality: N/A;   TEE WITHOUT CARDIOVERSION N/A 10/13/2020   Procedure: TRANSESOPHAGEAL ECHOCARDIOGRAM (TEE);  Surgeon: Barbaraann Darryle Ned, MD;  Location: [facility] ENDOSCOPY;  Service: Cardiovascular;  Laterality: N/A;   TOTAL HIP ARTHROPLASTY Right 10/19/2021   Procedure: RIGHT TOTAL HIP ARTHROPLASTY ANTERIOR APPROACH;  Surgeon: Jerri Kay HERO, MD;  Location:  [facility] OR;  Service: Orthopedics;  Laterality: Right;  3-C    Short Social History:  Social History   Tobacco Use   Smoking status: Every Day    Current packs/day: 0.25    Average packs/day: 0.3 packs/day for 50.0 years (12.5 ttl pk-yrs)    Types: Cigarettes   Smokeless tobacco: Never   Tobacco comments:    5-6 cigarettes per day  Substance Use Topics   Alcohol  use: Not Currently    Allergies  Allergen Reactions   Aleve [Naproxen] Other (See Comments)    Numbness in  extremities    Celebrex [Celecoxib] Other (See Comments)    Celebrex. Nose bleed.   Fluoxetine Hcl Swelling    Current Outpatient Medications  Medication Sig Dispense Refill   amLODipine  (NORVASC ) 10 MG tablet Take 10 mg by mouth in the morning.     amoxicillin  (AMOXIL ) 500 MG capsule Take 4 pills one hour prior to dental work 8 capsule 2   clopidogrel  (PLAVIX ) 75 MG tablet Take 1 tablet (75 mg total) by mouth daily. 90 tablet 3   diclofenac  Sodium (VOLTAREN  ARTHRITIS PAIN) 1 % GEL Apply 2 g topically daily as needed (pain).     diphenhydrAMINE  (BENADRYL ) 25 MG tablet Take 25 mg by mouth daily.     docusate sodium  (COLACE) 100 MG capsule Take 100 mg by mouth 2 (two) times daily.     HYDROcodone -acetaminophen  (NORCO ) 7.5-325 MG tablet Take 1 tablet by mouth every 6 (six) hours as needed for moderate pain (pain score 4-6). 56 tablet 0   rosuvastatin  (CRESTOR ) 10 MG tablet TAKE 1 TABLET EVERY DAY 90 tablet 1   tamsulosin  (FLOMAX ) 0.4 MG CAPS capsule Take 0.4 mg by mouth daily.      traZODone (DESYREL) 100 MG tablet Take 100 mg by mouth at bedtime.     No current facility-administered medications for this visit.    REVIEW OF SYSTEMS  All other systems were reviewed and are negative     Objective:  Objective   Vitals:   08/10/24 1107  BP: (!) 142/79  Pulse: 67  Resp: 18  Temp: 98.7 F (37.1 C)  TempSrc: Temporal  SpO2: 96%  Weight: 187 lb 3.2 oz (84.9 kg)  Height: 6' 6.5 (1.994 m)   Body mass index is 21.36 kg/m.  Physical Exam General: no acute distress Cardiac: hemodynamically stable Pulm: normal work of breathing Neuro: alert, no focal deficit Extremities: no edema, cyanosis or wounds.  Previous sores/blisters on toes have healed  Data: ABI +---------+------------------+-----+----------+--------+  Right   Rt Pressure (mmHg)IndexWaveform  Comment   +---------+------------------+-----+----------+--------+  Brachial 154                    triphasic            +---------+------------------+-----+----------+--------+  PTA     96                0.61 monophasic          +---------+------------------+-----+----------+--------+  DP      92                0.59 monophasic          +---------+------------------+-----+----------+--------+  Great Toe67                0.43                     +---------+------------------+-----+----------+--------+   +---------+------------------+-----+----------+-------+  Left    Lt Pressure (mmHg)IndexWaveform  Comment  +---------+------------------+-----+----------+-------+  Brachial 157                    triphasic          +---------+------------------+-----+----------+-------+  PTA     76                0.48 monophasic         +---------+------------------+-----+----------+-------+  DP      117               0.75 monophasic         +---------+------------------+-----+----------+-------+  Great Toe46                0.29                    +---------+------------------+-----+----------+-------+   Previously: Right 0.7 toe pressure 59, left 1.5 toe pressure 70,  Duplex +-----------+--------+-----+---------------+----------+--------+  RIGHT     PSV cm/sRatioStenosis       Waveform  Comments  +-----------+--------+-----+---------------+----------+--------+  CFA Mid    87                          biphasic            +-----------+--------+-----+---------------+----------+--------+  DFA       720          75-99% stenosismonophasic          +-----------+--------+-----+---------------+----------+--------+  SFA Prox   98                          biphasic            +-----------+--------+-----+---------------+----------+--------+  SFA Mid    102                         biphasic            +-----------+--------+-----+---------------+----------+--------+  SFA Distal 70                          biphasic             +-----------+--------+-----+---------------+----------+--------+  POP Prox   600          75-99% stenosisbiphasic            +-----------+--------+-----+---------------+----------+--------+  POP Mid                 occluded                           +-----------+--------+-----+---------------+----------+--------+  POP Distal 22                          monophasic          +-----------+--------+-----+---------------+----------+--------+  TP Trunk   15                          monophasic          +-----------+--------+-----+---------------+----------+--------+  ATA Distal 21                          monophasic          +-----------+--------+-----+---------------+----------+--------+  PTA Distal 20  monophasic          +-----------+--------+-----+---------------+----------+--------+  PERO Distal14                          monophasic          +-----------+--------+-----+---------------+----------+--------+   Stenosis seen in the proximal popliteal artery with a velocity of >600  cm/s and in a gastroc artery with a velocity of 456 cm/s.     +-----------+--------+-----+---------------+----------+--------+  LEFT      PSV cm/sRatioStenosis       Waveform  Comments  +-----------+--------+-----+---------------+----------+--------+  CFA Mid    75                          biphasic            +-----------+--------+-----+---------------+----------+--------+  DFA       123                         biphasic            +-----------+--------+-----+---------------+----------+--------+  SFA Prox   84                          triphasic           +-----------+--------+-----+---------------+----------+--------+  SFA Mid    72                          biphasic            +-----------+--------+-----+---------------+----------+--------+  SFA Distal 60                          biphasic             +-----------+--------+-----+---------------+----------+--------+  POP Prox   185          30-49% stenosisbiphasic            +-----------+--------+-----+---------------+----------+--------+  POP Distal 82                          biphasic            +-----------+--------+-----+---------------+----------+--------+  TP Trunk   101                         monophasic          +-----------+--------+-----+---------------+----------+--------+  ATA Prox   22                          monophasic          +-----------+--------+-----+---------------+----------+--------+  ATA Mid    16                          monophasic          +-----------+--------+-----+---------------+----------+--------+  ATA Distal 23                          monophasic          +-----------+--------+-----+---------------+----------+--------+  PTA Prox   38                          monophasic          +-----------+--------+-----+---------------+----------+--------+  PTA Mid    43                          monophasic          +-----------+--------+-----+---------------+----------+--------+  PTA Distal                                       NWV       +-----------+--------+-----+---------------+----------+--------+  PERO Prox  75                          monophasic          +-----------+--------+-----+---------------+----------+--------+  PERO Mid   10                          monophasic          +-----------+--------+-----+---------------+----------+--------+  PERO Distal17                          monophasic          +-----------+--------+-----+---------------+----------+--------+       Assessment/Plan:   Larry Reese is a 78 y.o. male with CL TI of his left lower extremity who underwent shockwave lithotripsy and balloon angioplasty of the left AT and popliteal artery on 03/09/2024.  His wounds have healed and his rest pain is  resolved. Today his vascular lab on the left demonstrates monophasic flow through the treated segment of the AT although the area is patent.  He continues to be asymptomatic and he denies rest pain or recurrence of his wounds.  His wounds are completely healed.  On the right he has a popliteal occlusion but he is also asymptomatic on this side. Given that this was a very difficult angiogram taking over 3 hours we will continue with surveillance as he is asymptomatic at this time. I strongly encouraged smoking cessation and explained that he is at very high risk of amputation should he continue to smoke. Plan to continue aspirin  and Plavix   Plan for follow-up in 3 months with repeat ABI and left leg duplex Continue dual antiplatelet indefinitely   Norman GORMAN Serve MD [facility]

## 2024-08-10 ENCOUNTER — Ambulatory Visit (HOSPITAL_COMMUNITY)
Admission: RE | Admit: 2024-08-10 | Discharge: 2024-08-10 | Disposition: A | Discharge status: Home / Self Care | Source: Ambulatory Visit | Attending: Vascular Surgery | Admitting: Vascular Surgery

## 2024-08-10 ENCOUNTER — Encounter: Payer: Self-pay | Admitting: Vascular Surgery

## 2024-08-10 ENCOUNTER — Ambulatory Visit (HOSPITAL_BASED_OUTPATIENT_CLINIC_OR_DEPARTMENT_OTHER)
Admission: RE | Admit: 2024-08-10 | Discharge: 2024-08-10 | Disposition: A | Discharge status: Home / Self Care | Source: Ambulatory Visit | Attending: Vascular Surgery | Admitting: Vascular Surgery

## 2024-08-10 ENCOUNTER — Ambulatory Visit (INDEPENDENT_AMBULATORY_CARE_PROVIDER_SITE_OTHER): Admitting: Vascular Surgery

## 2024-08-10 VITALS — BP 142/79 | HR 67 | Temp 98.7°F | Resp 18 | Ht 78.5 in | Wt 187.2 lb

## 2024-08-10 DIAGNOSIS — I739 Peripheral vascular disease, unspecified: Secondary | ICD-10-CM

## 2024-08-10 DIAGNOSIS — I70222 Atherosclerosis of native arteries of extremities with rest pain, left leg: Secondary | ICD-10-CM | POA: Diagnosis not present

## 2024-08-10 LAB — VAS US ABI WITH/WO TBI
Left ABI: 0.75
Right ABI: 0.61

## 2024-08-13 ENCOUNTER — Other Ambulatory Visit: Payer: Self-pay | Admitting: *Deleted

## 2024-08-13 DIAGNOSIS — I70222 Atherosclerosis of native arteries of extremities with rest pain, left leg: Secondary | ICD-10-CM

## 2024-08-13 DIAGNOSIS — I739 Peripheral vascular disease, unspecified: Secondary | ICD-10-CM

## 2024-08-22 ENCOUNTER — Other Ambulatory Visit: Payer: Self-pay | Admitting: Orthopedic Surgery

## 2024-08-22 ENCOUNTER — Telehealth: Payer: Self-pay

## 2024-08-22 DIAGNOSIS — G894 Chronic pain syndrome: Secondary | ICD-10-CM

## 2024-08-22 MED ORDER — HYDROCODONE-ACETAMINOPHEN 7.5-325 MG PO TABS: 1.0000 | ORAL_TABLET | Freq: Four times a day (QID) | ORAL | 0 refills | Status: DC | PRN

## 2024-08-22 NOTE — Telephone Encounter (Signed)
 Hydrocodone -Acetaminophen  7.5/325 MG  Qty 56 Tablets  Take 1 tablet by mouth every 6 (six) hours as needed for moderate pain (pain score 4-6).   PATIENT USES [facility]

## 2024-08-22 NOTE — Progress Notes (Signed)
 Meds ordered this encounter  Medications   HYDROcodone -acetaminophen  (NORCO ) 7.5-325 MG tablet    Sig: Take 1 tablet by mouth every 6 (six) hours as needed for moderate pain (pain score 4-6).    Dispense:  56 tablet    Refill:  0    Instructions regarding chronic pain management   You have chronic pain.  You are on chronic opioid therapy.  You will be referred to a chronic pain chronic opioid therapy specialist.  You have 30 days from today to continue getting your medications from this office.  After 30 days you would no longer get prescriptions for opioids from [facility].

## 2024-08-27 ENCOUNTER — Other Ambulatory Visit: Payer: Self-pay

## 2024-08-27 ENCOUNTER — Encounter: Payer: Self-pay | Admitting: Orthopedic Surgery

## 2024-08-27 ENCOUNTER — Ambulatory Visit: Admitting: Orthopedic Surgery

## 2024-08-27 DIAGNOSIS — G8929 Other chronic pain: Secondary | ICD-10-CM

## 2024-08-27 DIAGNOSIS — G894 Chronic pain syndrome: Secondary | ICD-10-CM

## 2024-08-27 MED ORDER — HYDROCODONE-ACETAMINOPHEN 7.5-325 MG PO TABS: 1.0000 | ORAL_TABLET | Freq: Four times a day (QID) | ORAL | 0 refills | Status: AC | PRN

## 2024-08-27 MED ORDER — METHYLPREDNISOLONE ACETATE 40 MG/ML IJ SUSP
40.0000 mg | Freq: Once | INTRAMUSCULAR | Status: AC
  Administered 2024-08-27: 40 mg via INTRA_ARTICULAR

## 2024-08-27 NOTE — Patient Instructions (Addendum)
 You have received an injection of steroids into the joint. 15% of patients will have increased pain within the 24 hours postinjection.   This is transient and will go away.   We recommend that you use ice packs on the injection site for 20 minutes every 2 hours and extra strength Tylenol  2 tablets every 8 as needed until the pain resolves.  If you continue to have pain after taking the Tylenol  and using the ice please call the office for further instructions.  Instructions regarding chronic pain management   You have chronic pain.  You are on chronic opioid therapy.  You will be referred to a chronic pain chronic opioid therapy specialist.  You have 30 days from today to continue getting your medications from this office.  After 30 days you would no longer get prescriptions for opioids from [facility].   [facility] [address]  [phone]  We will send referral there, you call next week to schedule, they will call you too.

## 2024-08-27 NOTE — Progress Notes (Signed)
    08/27/2024   Chief Complaint  Patient presents with   Shoulder Pain    Left     Encounter Diagnosis  Name Primary?   Chronic pain syndrome Yes    What pharmacy do you use ? ________[place]___________________  DOI/DOS/ Date:    Did you get better, worse or no change (Answer below)   Unchanged

## 2024-08-27 NOTE — Progress Notes (Addendum)
 FOLLOW-UP OFFICE VISIT   Patient: Larry Reese           Date of Birth: 1945/12/14           MRN: [id number] Visit Date: 08/27/2024 Requested by: Carlette Benita Area, MD [address] [place],  [place] [address] PCP: Carlette Benita Area, MD    Encounter Diagnoses  Name Primary?   Chronic pain syndrome Yes   Chronic left shoulder pain     Chief Complaint  Patient presents with   Shoulder Pain    Left    Problem list, medical hx, medications and allergies reviewed  Past Medical History:  Diagnosis Date   A-fib (HCC)    Anemia    Arthritis    Bradycardia    Dilatation of thoracic aorta    dilated aortic root and ascending aorta   Dupuytren's contracture of right hand    Hx of adenomatous colonic polyps    Hypertension    Peripheral vascular disease    Pneumothorax 09/18/1998   spontaneous right PTX 09/18/98, s/p chest tube   SDH (subdural hematoma) (HCC) 10/27/2004   left chronic SDH versus hygroma 10/27/04 CT   Stroke [facility]) 2005   left sided weakness   Trigger finger      78 year old male followed by Dr. Brenna who has retired presents for evaluation of ongoing chronic left shoulder pain  He indicates injections have helped him in the past  He complains of poor forward elevation chronic toothache like pain in the left shoulder in the PERI acromial region  Physical findings Isolated left shoulder exam  The skin is normal there is no joint effusion there is no specific areas of tenderness Range of motion passive: External rotation with the arm is at the side 40 degrees, forward elevation in the scapular plane 170 degrees  Active range of motion external rotation 40 degrees forward elevation in the scapular plane 110 degrees  Cuff strength in the empty can position grade 5 out of 5  DG Shoulder Left Result Date: 08/27/2024 X-ray report left shoulder chronic left shoulder pain There appear to be some calcifications in the area which would  involve the supraspinatus tendon.  The glenohumeral joint overall looks normal maybe some narrowing inferiorly the humeral head distance to the acromion looks normal and the greater tuberosity shows degenerative changes Impression mild OA left shoulder joint     ASSESSMENT AND PLAN  Chronic shoulder pain  Encounter Diagnoses  Name Primary?   Chronic pain syndrome Yes   Chronic left shoulder pain     Recommend subacromial injection  Procedure note the subacromial injection shoulder left   Verbal consent was obtained to inject the  Left   Shoulder  Timeout was completed to confirm the injection site is a subacromial space of the  left  shoulder  Medication used Depo-Medrol  40 mg and lidocaine  1% 3 cc  Anesthesia was provided by ethyl chloride  The injection was performed in the left  posterior subacromial space. After pinning the skin with alcohol  and anesthetized the skin with ethyl chloride the subacromial space was injected using a 20-gauge needle. There were no complications  Sterile dressing was applied.   Meds ordered this encounter  Medications   methylPREDNISolone  acetate (DEPO-MEDROL ) injection 40 mg   HYDROcodone -acetaminophen  (NORCO ) 7.5-325 MG tablet    Sig: Take 1 tablet by mouth every 6 (six) hours as needed for moderate pain (pain score 4-6).    Dispense:  56 tablet  Refill:  0     Instructions regarding chronic pain management   You have chronic pain.  You are on chronic opioid therapy.  You will be referred to a chronic pain chronic opioid therapy specialist.  You have 30 days from today to continue getting your medications from this office.  After 30 days you would no longer get prescriptions for opioids from [facility].

## 2024-08-27 NOTE — Addendum Note (Signed)
 Addended by: MARGRETTE TAFT BRAVO on: 08/27/2024 09:49 AM   Modules accepted: Orders

## 2024-09-16 ENCOUNTER — Emergency Department (HOSPITAL_COMMUNITY)
Admission: EM | Admit: 2024-09-16 | Discharge: 2024-09-16 | Disposition: A | Discharge status: Home / Self Care | Attending: Emergency Medicine | Admitting: Emergency Medicine

## 2024-09-16 ENCOUNTER — Other Ambulatory Visit: Payer: Self-pay

## 2024-09-16 ENCOUNTER — Emergency Department (HOSPITAL_COMMUNITY)

## 2024-09-16 DIAGNOSIS — I10 Essential (primary) hypertension: Secondary | ICD-10-CM | POA: Insufficient documentation

## 2024-09-16 DIAGNOSIS — J101 Influenza due to other identified influenza virus with other respiratory manifestations: Secondary | ICD-10-CM | POA: Diagnosis not present

## 2024-09-16 DIAGNOSIS — B974 Respiratory syncytial virus as the cause of diseases classified elsewhere: Secondary | ICD-10-CM | POA: Diagnosis not present

## 2024-09-16 DIAGNOSIS — B338 Other specified viral diseases: Secondary | ICD-10-CM

## 2024-09-16 DIAGNOSIS — R059 Cough, unspecified: Secondary | ICD-10-CM | POA: Diagnosis present

## 2024-09-16 LAB — URINALYSIS, ROUTINE W REFLEX MICROSCOPIC
Bacteria, UA: NONE SEEN
Bilirubin Urine: NEGATIVE
Glucose, UA: NEGATIVE mg/dL
Ketones, ur: NEGATIVE mg/dL
Leukocytes,Ua: NEGATIVE
Nitrite: NEGATIVE
Protein, ur: NEGATIVE mg/dL
Specific Gravity, Urine: 1.013 (ref 1.005–1.030)
pH: 5 (ref 5.0–8.0)

## 2024-09-16 LAB — RESP PANEL BY RT-PCR (RSV, FLU A&B, COVID)  RVPGX2
Influenza A by PCR: POSITIVE — AB
Influenza B by PCR: NEGATIVE
Resp Syncytial Virus by PCR: POSITIVE — AB
SARS Coronavirus 2 by RT PCR: NEGATIVE

## 2024-09-16 MED ORDER — OSELTAMIVIR PHOSPHATE 75 MG PO CAPS: 75.0000 mg | ORAL_CAPSULE | Freq: Two times a day (BID) | ORAL | 0 refills | Status: AC

## 2024-09-16 NOTE — ED Provider Notes (Signed)
 " [place] EMERGENCY DEPARTMENT AT [facility] Provider Note   CSN: [id number] Arrival date & time: 09/16/24  0725     Patient presents with: Fever, Generalized Body Aches, and Cough   Larry Reese is a 78 y.o. male.   78 year old male history of A-fib, hypertension, and PAD who presents to the emergency department with URI type symptoms for 3 days.  Wife sick with similar symptoms.  Has been having a runny nose, dry cough, subjective fevers, and bodyaches.  Also reports some urinary frequency but no dysuria.  No flank pain.       Prior to Admission medications  Medication Sig Start Date End Date Taking? Authorizing Provider  oseltamivir  (TAMIFLU ) 75 MG capsule Take 1 capsule (75 mg total) by mouth every 12 (twelve) hours. 09/16/24  Yes Yolande Lamar BROCKS, MD  amLODipine  (NORVASC ) 10 MG tablet Take 10 mg by mouth in the morning.    [provider]  amoxicillin  (AMOXIL ) 500 MG capsule Take 4 pills one hour prior to dental work 10/27/22   Jule Ronal CROME, PA-C  clopidogrel  (PLAVIX ) 75 MG tablet Take 1 tablet (75 mg total) by mouth daily. 04/13/24 04/13/25  Pearline Norman RAMAN, MD  diclofenac  Sodium (VOLTAREN  ARTHRITIS PAIN) 1 % GEL Apply 2 g topically daily as needed (pain).    [provider]  diphenhydrAMINE  (BENADRYL ) 25 MG tablet Take 25 mg by mouth daily.    [provider]  docusate sodium  (COLACE) 100 MG capsule Take 100 mg by mouth 2 (two) times daily.    [provider]  HYDROcodone -acetaminophen  (NORCO ) 7.5-325 MG tablet Take 1 tablet by mouth every 6 (six) hours as needed for moderate pain (pain score 4-6). 08/27/24   Margrette Taft FORBES, MD  rosuvastatin  (CRESTOR ) 10 MG tablet TAKE 1 TABLET EVERY DAY 02/15/24   Lonni Slain, MD  tamsulosin  (FLOMAX ) 0.4 MG CAPS capsule Take 0.4 mg by mouth daily.  07/14/18   [provider]  traZODone (DESYREL) 100 MG tablet Take 100 mg by mouth at bedtime. 12/15/21   [provider]    Allergies: Aleve [naproxen], Celebrex [celecoxib], and Fluoxetine hcl    Review of Systems  Updated Vital Signs BP (!) 150/78   Pulse 83   Temp 99.8 F (37.7 C) (Oral)   Resp 17   Ht 6' 6.5 (1.994 m)   Wt 83.9 kg   SpO2 98%   BMI 21.11 kg/m   Physical Exam Constitutional:      Appearance: Normal appearance.  HENT:     Right Ear: External ear normal.     Left Ear: External ear normal.     Mouth/Throat:     Mouth: Mucous membranes are moist.     Pharynx: Oropharynx is clear.  Eyes:     Extraocular Movements: Extraocular movements intact.     Conjunctiva/sclera: Conjunctivae normal.     Pupils: Pupils are equal, round, and reactive to light.  Cardiovascular:     Rate and Rhythm: Normal rate and regular rhythm.     Pulses: Normal pulses.     Heart sounds: Normal heart sounds.  Pulmonary:     Effort: Pulmonary effort is normal. No respiratory distress.     Breath sounds: Normal breath sounds.  Neurological:     Mental Status: He is alert.     (all labs ordered are listed, but only abnormal results are displayed) Labs Reviewed  RESP PANEL BY RT-PCR (RSV, FLU A&B, COVID)  RVPGX2 - Abnormal;  Notable for the following components:      Result Value   Influenza A by PCR POSITIVE (*)    Resp Syncytial Virus by PCR POSITIVE (*)    All other components within normal limits  URINALYSIS, ROUTINE W REFLEX MICROSCOPIC - Abnormal; Notable for the following components:   Hgb urine dipstick SMALL (*)    All other components within normal limits    EKG: None  Radiology: DG Chest 2 View Result Date: 09/16/2024 CLINICAL DATA:  Cough, fever, and nausea for several days. EXAM: CHEST - 2 VIEW COMPARISON:  05/21/2021 FINDINGS: The heart size and mediastinal contours are within normal limits. Left basilar pleural-parenchymal scarring again noted. The lungs are otherwise clear. Multiple old bilateral rib fracture deformities again noted. IMPRESSION: Left basilar  pleural-parenchymal scarring. No active cardiopulmonary disease. Electronically Signed   By: Norleen DELENA Kil M.D.   On: 09/16/2024 08:32     Procedures   Medications Ordered in the ED - No data to display                                  Medical Decision Making Amount and/or Complexity of Data Reviewed Labs: ordered. Radiology: ordered.  Risk Prescription drug management.   78 year old male history of atrial fibrillation, hypertension, and PAD who presents to the emergency department for URI type symptoms for 3 days  Initial Ddx:  URI, pneumonia, bronchitis  MDM/Course:  Patient presents to the emergency department with URI symptoms for 3 days.  Wife is here with similar complaints.  He is overall well-appearing on exam.  Lungs are clear to auscultation bilaterally.  He satting well on room air.  He is not septic.  Was found to have both influenza A as well as RSV.  Chest x-ray without pneumonia.  Did report some urinary frequency and so had a UA sent that is not consistent with UTI.  Upon re-evaluation patient remained stable.  Updated on the results of his tests and was started on Tamiflu   This patient presents to the ED for concern of complaints listed in HPI, this involves an extensive number of treatment options, and is a complaint that carries with it a high risk of complications and morbidity. Disposition including potential need for admission considered.   Dispo: DC Home. Return precautions discussed including, but not limited to, those listed in the AVS. Allowed pt time to ask questions which were answered fully prior to dc.  I have reviewed the patients home medications and made adjustments as needed Additional history obtained from spouse Records reviewed Outpatient Clinic Notes I independently reviewed the following imaging with scope of interpretation limited to determining acute life threatening conditions related to emergency care: Chest x-ray and agree with the  radiologist interpretation with the following exceptions: none I personally reviewed and interpreted cardiac monitoring: normal sinus rhythm  I personally reviewed and interpreted the pt's EKG: see above for interpretation  Social Determinants of health:  Geriatric  Portions of this note were generated with Scientist, clinical (histocompatibility and immunogenetics). Dictation errors may occur despite best attempts at proofreading.     Final diagnoses:  Influenza A  Infection due to respiratory syncytial virus (RSV), unspecified infection type    ED Discharge Orders          Ordered    oseltamivir  (TAMIFLU ) 75 MG capsule  Every 12 hours        09/16/24 0956  Yolande Lamar BROCKS, MD 09/16/24 2015  "

## 2024-09-16 NOTE — Discharge Instructions (Signed)
 You were seen for your upper respiratory tract infection (influenza and RSV) in the emergency department.   At home, please use Tylenol  for your muscle aches and fevers.  Please use over-the-counter cough medication or tea with honey for your cough.  Take the Tamiflu  for influenza.  Follow-up with your primary doctor in 2-3 days regarding your visit.  This may be over the phone.  Return immediately to the emergency department if you experience any of the following: Difficulty breathing, or any other concerning symptoms.    Thank you for visiting our Emergency Department. It was a pleasure taking care of you today.

## 2024-09-16 NOTE — ED Triage Notes (Signed)
 Pt c/o nausea, cough, fever, runny nose, bodyaches, and sweating x 3 days.

## 2024-11-30 ENCOUNTER — Ambulatory Visit (HOSPITAL_COMMUNITY)

## 2024-11-30 ENCOUNTER — Ambulatory Visit
